# Patient Record
Sex: Male | Born: 1995
Health system: Southern US, Community
[De-identification: ages and names within clinical notes are randomized; demographics above are authoritative.]

## PROBLEM LIST (undated history)

## (undated) DIAGNOSIS — G919 Hydrocephalus, unspecified: Secondary | ICD-10-CM

## (undated) DIAGNOSIS — E2740 Unspecified adrenocortical insufficiency: Secondary | ICD-10-CM

## (undated) DIAGNOSIS — M810 Age-related osteoporosis without current pathological fracture: Secondary | ICD-10-CM

## (undated) DIAGNOSIS — E079 Disorder of thyroid, unspecified: Secondary | ICD-10-CM

## (undated) DIAGNOSIS — C801 Malignant (primary) neoplasm, unspecified: Secondary | ICD-10-CM

## (undated) HISTORY — DX: Hydrocephalus, unspecified: G91.9

## (undated) HISTORY — DX: Unspecified adrenocortical insufficiency: E27.40

## (undated) HISTORY — PX: OTHER SURGICAL HISTORY: SHX169

## (undated) HISTORY — PX: HIP SURGERY: SHX245

## (undated) HISTORY — DX: Disorder of thyroid, unspecified: E07.9

## (undated) HISTORY — DX: Malignant (primary) neoplasm, unspecified: C80.1

---
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
## 2015-09-09 ENCOUNTER — Emergency Department (HOSPITAL_COMMUNITY): Payer: Medicaid Other

## 2015-09-09 ENCOUNTER — Inpatient Hospital Stay (HOSPITAL_COMMUNITY)
Admission: EM | Admit: 2015-09-09 | Discharge: 2015-09-11 | DRG: 193 | Disposition: A | Discharge status: Home / Self Care | Payer: Medicaid Other | Attending: Family Medicine | Admitting: Family Medicine

## 2015-09-09 ENCOUNTER — Encounter (HOSPITAL_COMMUNITY): Payer: Self-pay | Admitting: Emergency Medicine

## 2015-09-09 ENCOUNTER — Encounter: Payer: Self-pay | Admitting: Family Medicine

## 2015-09-09 ENCOUNTER — Ambulatory Visit (INDEPENDENT_AMBULATORY_CARE_PROVIDER_SITE_OTHER): Payer: Medicaid Other | Admitting: Family Medicine

## 2015-09-09 VITALS — BP 106/52 | HR 78 | Temp 97.0°F | Ht 70.0 in | Wt 150.0 lb

## 2015-09-09 DIAGNOSIS — E038 Other specified hypothyroidism: Secondary | ICD-10-CM | POA: Diagnosis not present

## 2015-09-09 DIAGNOSIS — E871 Hypo-osmolality and hyponatremia: Secondary | ICD-10-CM | POA: Diagnosis present

## 2015-09-09 DIAGNOSIS — E860 Dehydration: Secondary | ICD-10-CM | POA: Diagnosis present

## 2015-09-09 DIAGNOSIS — C801 Malignant (primary) neoplasm, unspecified: Secondary | ICD-10-CM

## 2015-09-09 DIAGNOSIS — E2740 Unspecified adrenocortical insufficiency: Secondary | ICD-10-CM | POA: Diagnosis not present

## 2015-09-09 DIAGNOSIS — E43 Unspecified severe protein-calorie malnutrition: Secondary | ICD-10-CM

## 2015-09-09 DIAGNOSIS — E039 Hypothyroidism, unspecified: Secondary | ICD-10-CM | POA: Diagnosis present

## 2015-09-09 DIAGNOSIS — E876 Hypokalemia: Secondary | ICD-10-CM | POA: Diagnosis not present

## 2015-09-09 DIAGNOSIS — I959 Hypotension, unspecified: Secondary | ICD-10-CM

## 2015-09-09 DIAGNOSIS — Z982 Presence of cerebrospinal fluid drainage device: Secondary | ICD-10-CM

## 2015-09-09 DIAGNOSIS — R627 Adult failure to thrive: Secondary | ICD-10-CM | POA: Diagnosis present

## 2015-09-09 DIAGNOSIS — J189 Pneumonia, unspecified organism: Secondary | ICD-10-CM | POA: Diagnosis not present

## 2015-09-09 DIAGNOSIS — Z85841 Personal history of malignant neoplasm of brain: Secondary | ICD-10-CM

## 2015-09-09 DIAGNOSIS — I9589 Other hypotension: Secondary | ICD-10-CM | POA: Diagnosis present

## 2015-09-09 DIAGNOSIS — F17210 Nicotine dependence, cigarettes, uncomplicated: Secondary | ICD-10-CM | POA: Diagnosis present

## 2015-09-09 DIAGNOSIS — R51 Headache: Secondary | ICD-10-CM

## 2015-09-09 DIAGNOSIS — R519 Headache, unspecified: Secondary | ICD-10-CM

## 2015-09-09 DIAGNOSIS — D649 Anemia, unspecified: Secondary | ICD-10-CM | POA: Diagnosis present

## 2015-09-09 DIAGNOSIS — Z923 Personal history of irradiation: Secondary | ICD-10-CM

## 2015-09-09 LAB — PHOSPHORUS: PHOSPHORUS: 2.6 mg/dL (ref 2.5–4.6)

## 2015-09-09 LAB — CBC WITH DIFFERENTIAL/PLATELET
BASOS ABS: 0 10*3/uL (ref 0.0–0.1)
Basophils Relative: 0 %
EOS ABS: 0.2 10*3/uL (ref 0.0–0.7)
EOS PCT: 1 %
HCT: 30 % — ABNORMAL LOW (ref 39.0–52.0)
Hemoglobin: 10.8 g/dL — ABNORMAL LOW (ref 13.0–17.0)
LYMPHS ABS: 2 10*3/uL (ref 0.7–4.0)
Lymphocytes Relative: 16 %
MCH: 32.2 pg (ref 26.0–34.0)
MCHC: 36 g/dL (ref 30.0–36.0)
MCV: 89.6 fL (ref 78.0–100.0)
MONO ABS: 1 10*3/uL (ref 0.1–1.0)
Monocytes Relative: 8 %
Neutro Abs: 9.2 10*3/uL — ABNORMAL HIGH (ref 1.7–7.7)
Neutrophils Relative %: 75 %
PLATELETS: 291 10*3/uL (ref 150–400)
RBC: 3.35 MIL/uL — AB (ref 4.22–5.81)
RDW: 13.4 % (ref 11.5–15.5)
WBC: 12.4 10*3/uL — AB (ref 4.0–10.5)

## 2015-09-09 LAB — COMPREHENSIVE METABOLIC PANEL
ALT: 8 U/L — AB (ref 17–63)
AST: 22 U/L (ref 15–41)
Albumin: 3.6 g/dL (ref 3.5–5.0)
Alkaline Phosphatase: 85 U/L (ref 38–126)
Anion gap: 8 (ref 5–15)
BUN: 7 mg/dL (ref 6–20)
CHLORIDE: 90 mmol/L — AB (ref 101–111)
CO2: 33 mmol/L — AB (ref 22–32)
CREATININE: 0.6 mg/dL — AB (ref 0.61–1.24)
Calcium: 9.1 mg/dL (ref 8.9–10.3)
GFR calc non Af Amer: >60 mL/min (ref >60)
Glucose, Bld: 99 mg/dL (ref 65–99)
POTASSIUM: 2.6 mmol/L — AB (ref 3.5–5.1)
SODIUM: 131 mmol/L — AB (ref 135–145)
Total Bilirubin: 0.6 mg/dL (ref 0.3–1.2)
Total Protein: 6.8 g/dL (ref 6.5–8.1)

## 2015-09-09 LAB — URINALYSIS, ROUTINE W REFLEX MICROSCOPIC
BILIRUBIN URINE: NEGATIVE
Glucose, UA: NEGATIVE mg/dL
Hgb urine dipstick: NEGATIVE
KETONES UR: NEGATIVE mg/dL
LEUKOCYTES UA: NEGATIVE
NITRITE: NEGATIVE
PH: 5.5 (ref 5.0–8.0)
PROTEIN: NEGATIVE mg/dL
Specific Gravity, Urine: 1.02 (ref 1.005–1.030)
UROBILINOGEN UA: 1 mg/dL (ref 0.0–1.0)

## 2015-09-09 LAB — TSH: TSH: 5.549 u[IU]/mL — AB (ref 0.350–4.500)

## 2015-09-09 LAB — I-STAT CG4 LACTIC ACID, ED
LACTIC ACID, VENOUS: 1.83 mmol/L (ref 0.5–2.0)
LACTIC ACID, VENOUS: 0.58 mmol/L (ref 0.5–2.0)

## 2015-09-09 LAB — MAGNESIUM: MAGNESIUM: 1.4 mg/dL — AB (ref 1.7–2.4)

## 2015-09-09 MED ORDER — SODIUM CHLORIDE 0.9 % IV BOLUS (SEPSIS)
1000.0000 mL | Freq: Once | INTRAVENOUS | Status: AC
Start: 2015-09-09 — End: 2015-09-09
  Administered 2015-09-09: 1000 mL via INTRAVENOUS

## 2015-09-09 MED ORDER — POTASSIUM CHLORIDE CRYS ER 20 MEQ PO TBCR
40.0000 meq | EXTENDED_RELEASE_TABLET | Freq: Once | ORAL | Status: AC
  Administered 2015-09-09: 40 meq via ORAL
  Filled 2015-09-09: qty 2

## 2015-09-09 MED ORDER — ONDANSETRON HCL 4 MG PO TABS: 4.0000 mg | ORAL_TABLET | Freq: Four times a day (QID) | ORAL | Status: DC | PRN

## 2015-09-09 MED ORDER — ONDANSETRON HCL 4 MG/2ML IJ SOLN
4.0000 mg | Freq: Four times a day (QID) | INTRAMUSCULAR | Status: DC | PRN
  Administered 2015-09-10 – 2015-09-11 (×2): 4 mg via INTRAVENOUS
  Filled 2015-09-09 (×3): qty 2

## 2015-09-09 MED ORDER — SODIUM CHLORIDE 0.9 % IV SOLN
INTRAVENOUS | Status: DC
  Administered 2015-09-09 – 2015-09-10 (×2): via INTRAVENOUS

## 2015-09-09 MED ORDER — HYDROCORTISONE NA SUCCINATE PF 100 MG IJ SOLR
100.0000 mg | Freq: Once | INTRAMUSCULAR | Status: AC
  Administered 2015-09-09: 100 mg via INTRAVENOUS
  Filled 2015-09-09: qty 2

## 2015-09-09 MED ORDER — SODIUM CHLORIDE 0.9 % IJ SOLN
3.0000 mL | Freq: Two times a day (BID) | INTRAMUSCULAR | Status: DC
  Administered 2015-09-10: 3 mL via INTRAVENOUS

## 2015-09-09 MED ORDER — MAGNESIUM SULFATE 2 GM/50ML IV SOLN
2.0000 g | Freq: Once | INTRAVENOUS | Status: AC
  Administered 2015-09-09: 2 g via INTRAVENOUS
  Filled 2015-09-09: qty 50

## 2015-09-09 MED ORDER — LEVOTHYROXINE SODIUM 88 MCG PO TABS
88.0000 ug | ORAL_TABLET | Freq: Every day | ORAL | Status: DC
  Administered 2015-09-10 – 2015-09-11 (×2): 88 ug via ORAL
  Filled 2015-09-09 (×2): qty 1

## 2015-09-09 MED ORDER — POTASSIUM CHLORIDE 10 MEQ/100ML IV SOLN
10.0000 meq | INTRAVENOUS | Status: AC
Start: 2015-09-09 — End: 2015-09-09
  Administered 2015-09-09: 10 meq via INTRAVENOUS
  Filled 2015-09-09: qty 100

## 2015-09-09 MED ORDER — ENOXAPARIN SODIUM 40 MG/0.4ML ~~LOC~~ SOLN: 40.0000 mg | SUBCUTANEOUS | Status: DC

## 2015-09-09 MED ORDER — POTASSIUM CHLORIDE CRYS ER 20 MEQ PO TBCR
40.0000 meq | EXTENDED_RELEASE_TABLET | Freq: Once | ORAL | Status: AC
  Administered 2015-09-09: 40 meq via ORAL

## 2015-09-09 MED ORDER — ACETAMINOPHEN 650 MG RE SUPP: 650.0000 mg | Freq: Four times a day (QID) | RECTAL | Status: DC | PRN

## 2015-09-09 MED ORDER — POTASSIUM CHLORIDE 10 MEQ/100ML IV SOLN
10.0000 meq | INTRAVENOUS | Status: AC
  Administered 2015-09-09 – 2015-09-10 (×6): 10 meq via INTRAVENOUS
  Filled 2015-09-09 (×2): qty 100

## 2015-09-09 MED ORDER — CEFTRIAXONE SODIUM 1 G IJ SOLR
1.0000 g | Freq: Once | INTRAMUSCULAR | Status: AC
  Administered 2015-09-09: 1 g via INTRAVENOUS
  Filled 2015-09-09: qty 10

## 2015-09-09 MED ORDER — PREDNISONE 10 MG PO TABS
5.0000 mg | ORAL_TABLET | Freq: Every day | ORAL | Status: DC
  Administered 2015-09-10: 5 mg via ORAL
  Filled 2015-09-09: qty 1

## 2015-09-09 MED ORDER — ENOXAPARIN SODIUM 40 MG/0.4ML ~~LOC~~ SOLN
40.0000 mg | SUBCUTANEOUS | Status: DC
  Administered 2015-09-09 – 2015-09-10 (×2): 40 mg via SUBCUTANEOUS
  Filled 2015-09-09 (×2): qty 0.4

## 2015-09-09 MED ORDER — LEVOTHYROXINE SODIUM 88 MCG PO TABS: 88.0000 ug | ORAL_TABLET | Freq: Every day | ORAL | Status: DC

## 2015-09-09 MED ORDER — DEXTROSE 5 % IV SOLN
500.0000 mg | INTRAVENOUS | Status: DC
  Administered 2015-09-09: 500 mg via INTRAVENOUS
  Filled 2015-09-09: qty 500

## 2015-09-09 MED ORDER — PREDNISONE 10 MG PO TABS: 5.0000 mg | ORAL_TABLET | Freq: Every day | ORAL | Status: DC

## 2015-09-09 MED ORDER — AZITHROMYCIN 250 MG PO TABS
500.0000 mg | ORAL_TABLET | ORAL | Status: DC
  Administered 2015-09-10: 500 mg via ORAL
  Filled 2015-09-09: qty 2

## 2015-09-09 MED ORDER — ACETAMINOPHEN 325 MG PO TABS
650.0000 mg | ORAL_TABLET | Freq: Four times a day (QID) | ORAL | Status: DC | PRN
  Administered 2015-09-10 (×2): 650 mg via ORAL
  Filled 2015-09-09 (×2): qty 2

## 2015-09-09 MED ORDER — DEXTROSE 5 % IV SOLN
1.0000 g | INTRAVENOUS | Status: DC
  Administered 2015-09-10: 1 g via INTRAVENOUS
  Filled 2015-09-09 (×3): qty 10

## 2015-09-09 NOTE — Progress Notes (Signed)
BP 106/52 mmHg  Pulse 78  Temp(Src) 97 F (36.1 C) (Oral)  Ht '5\' 10"'  (1.778 m)  Wt 150 lb (68.04 kg)  BMI 21.52 kg/m2   Subjective:    Patient ID: James Hoffman, male    DOB: [birth year], 19 y.o.   MRN: [id number]  HPI: DEFORREST BOGLE is a 19 y.o. male presenting on 09/09/2015 for Thyroid recheck   HPI Hypothyroidism Patient present today for refill on his thyroid medication. He has been out of his thyroid medication for 3 weeks and in that time has relatively stopped eating and almost stopped fluid intake as well. He has thinned out a lot and her mother, pale appearance but often has that. He was followed up with an endocrinologist for this but she has been unable to take him down to the appointments for that.  Malnutrition and dehydration Patient presents today with the appearance of malnutrition and severe dehydration because he has not been eating or having very good fluid intake over the past time. He denies any abdominal complaints or urinary complaints or respiratory complaints. Per mother does admit that his urine has been very dark and decreased in frequency.  Relevant past medical, surgical, family and social history reviewed and updated as indicated. Interim medical history since our last visit reviewed. Allergies and medications reviewed and updated.  Review of Systems  Constitutional: Positive for fatigue and unexpected weight change. Negative for fever and chills.  HENT: Positive for congestion. Negative for ear discharge and ear pain.   Eyes: Negative for pain, discharge, redness and visual disturbance.  Respiratory: Positive for cough. Negative for shortness of breath and wheezing.   Cardiovascular: Negative for chest pain, palpitations and leg swelling.  Gastrointestinal: Negative for nausea, vomiting, abdominal pain, diarrhea, constipation, blood in stool and anal bleeding.  Endocrine: Negative for cold intolerance, heat intolerance, polydipsia and  polyuria.  Genitourinary: Positive for decreased urine volume. Negative for urgency, frequency, hematuria, flank pain and difficulty urinating.  Musculoskeletal: Negative for back pain and gait problem.  Skin: Negative for rash.  Neurological: Negative for dizziness, syncope, light-headedness and headaches.  All other systems reviewed and are negative.   Per HPI unless specifically indicated above     Medication List       This list is accurate as of: 09/09/15  2:18 PM.  Always use your most recent med list.               levothyroxine 88 MCG tablet  Commonly known as:  SYNTHROID, LEVOTHROID  Take 1 tablet (88 mcg total) by mouth daily before breakfast.           Objective:    BP 106/52 mmHg  Pulse 78  Temp(Src) 97 F (36.1 C) (Oral)  Ht '5\' 10"'  (1.778 m)  Wt 150 lb (68.04 kg)  BMI 21.52 kg/m2  Wt Readings from Last 3 Encounters:  09/09/15 150 lb (68.04 kg) (43 %*, Z = -0.18)  11/25/13 154 lb 6.4 oz (70.035 kg) (62 %*, Z = 0.30)   * Growth percentiles are based on CDC 2-20 Years data.    Physical Exam  Constitutional: He is oriented to person, place, and time. Vital signs are normal. He appears cachectic. He is cooperative. He has a sickly appearance. He appears ill. No distress.  HENT:  Mouth/Throat: Uvula is midline. Mucous membranes are pale, dry and not cyanotic. No oropharyngeal exudate, posterior oropharyngeal edema, posterior oropharyngeal erythema or tonsillar abscesses.  Eyes: Conjunctivae and EOM are  normal. Pupils are equal, round, and reactive to light. Right eye exhibits no discharge. Left eye exhibits no discharge and no exudate. No scleral icterus.  Sunken eyes  Cardiovascular: Normal rate, regular rhythm, normal heart sounds and intact distal pulses.   No murmur heard. Pulmonary/Chest: Effort normal and breath sounds normal. No respiratory distress. He has no wheezes.  Abdominal: He exhibits no distension.  Musculoskeletal: Normal range of motion.  He exhibits no edema.  Neurological: He is oriented to person, place, and time. Coordination normal.  Skin: Skin is warm and dry. No rash noted. He is not diaphoretic.  Psychiatric: He has a normal mood and affect. His behavior is normal.  Vitals reviewed.   Results for orders placed or performed in visit on 12/23/13  CMP14+EGFR  Result Value Ref Range   Glucose 102 (H) 65 - 99 mg/dL   BUN 9 5 - 18 mg/dL   Creatinine, Ser 1.06 0.76 - 1.27 mg/dL   GFR calc non Af Amer CANCELED mL/min/1.73   GFR calc Af Amer CANCELED mL/min/1.73   BUN/Creatinine Ratio 8 (L) 9 - 27   Sodium 137 134 - 144 mmol/L   Potassium 3.3 (L) 3.5 - 5.2 mmol/L   Chloride 97 97 - 108 mmol/L   CO2 20 18 - 29 mmol/L   Calcium 9.0 8.9 - 10.4 mg/dL   Total Protein 6.0 6.0 - 8.5 g/dL   Albumin 4.0 3.5 - 5.5 g/dL   Globulin, Total 2.0 1.5 - 4.5 g/dL   Albumin/Globulin Ratio 2.0 1.1 - 2.5   Total Bilirubin <0.2 0.0 - 1.2 mg/dL   Alkaline Phosphatase 90 61 - 146 IU/L   AST 17 0 - 40 IU/L   ALT 15 0 - 30 IU/L  Thyroid Panel With TSH  Result Value Ref Range   TSH 1.200 0.450 - 4.500 uIU/mL   T4, Total 6.2 4.5 - 12.0 ug/dL   T3 Uptake Ratio 31 24 - 38 %   Free Thyroxine Index 1.9 1.2 - 4.9      Assessment & Plan:   Problem List Items Addressed This Visit      Endocrine   Other specified hypothyroidism - Primary    Restart thyroid medication. Test thyroid levels      Relevant Medications   levothyroxine (SYNTHROID, LEVOTHROID) 88 MCG tablet   Other Relevant Orders   Thyroid Panel With TSH    Other Visit Diagnoses    Dehydration        Patient appears dehydrated and severely malnourished. His eyes are sunken and his mucous membranes are dry and he has decreased urinary output, sent to ED    Relevant Orders    CBC with Differential/Platelet    CMP14+EGFR    POCT UA - Microscopic Only    POCT urinalysis dipstick    Severe malnutrition ([place])        Relevant Orders    Face-to-face encounter (required for  Medicare/Medicaid patients)        Follow up plan: Return in about 2 weeks (around 09/23/2015), or if symptoms worsen or fail to improve, for Follow-up on thyroid and malnutrition.  Caryl Pina, MD [facility] 09/09/2015, 2:18 PM

## 2015-09-09 NOTE — H&P (Signed)
[facility] History and Physical  DEAKEN JURGENS HCW:[id number] DOB: 12-Nov-1996 DOA: 09/09/2015  Referring physician: Dr Oleta Mouse - [facility] PCP: Kenn File, MD   Chief Complaint: James Hoffman poor oral intake  HPI: James Hoffman is a 19 y.o. male   1 week history of productive cough with associated runny nose and sore throat. Initially intermittent but fairly constant. Getting worse. Subjective fevers.Very little oral intake over the last several days. Urine is become very dark and pungent. Intermittent nausea and vomiting with food which is made eating significantly less palatable for patient. Patient with intermittent headaches as well which are somewhat baseline for patient. States patient has seemed a little more confused and forgetful recently. Denies abdominal pain, diarrhea, vision change, focal abnormality.  Intracranial shunt placed in 2012 with 2 revisions. Shunt modification in 2013. Last nor appointment March 2016.  Out of thyroid medication for approximately one month.general fatigue and cold intolerance.   Review of Systems:  Constitutional:  No weight loss, night sweats,  HEENT:  No difficulty swallowing,Tooth/dental problems Cardio-vascular:  No chest pain, Orthopnea, PND, swelling in lower extremities, anasarca, dizziness, palpitations  GI:  No heartburn, indigestion, change in bowel habits, Resp: Per HPI  Skin:  no rash or lesions.  GU:  no dysuria, change in color of urine, no urgency or frequency. No flank pain.  Musculoskeletal:   No joint pain or swelling. No decreased range of motion. No back pain.  Psych: Per HPI Neuro:  No change in sensation, unilateral strength, or cognitive abilities  All other systems were reviewed and are negative.  Past Medical History  Diagnosis Date  . Thyroid disease   . Cancer ([place])     brain tumor on brain stem  . Adrenal insufficiency ([place])   . Hydrocephalus    Past Surgical History  Procedure Laterality  Date  . Brain tumor removal  August 2012   Social History:  reports that he has been smoking Cigarettes.  He started smoking about 2 years ago. He has been smoking about 0.50 packs per day. He does not have any smokeless tobacco history on file. He reports that he does not drink alcohol or use illicit drugs.  No Known Allergies  History reviewed. No pertinent family history.   Prior to Admission medications   Medication Sig Start Date End Date Taking? Authorizing Provider  predniSONE (DELTASONE) 5 MG tablet Take 5 mg by mouth daily. Take 2-3 tabs a day when sick 07/07/15  Yes Historical Provider, MD  levothyroxine (SYNTHROID, LEVOTHROID) 88 MCG tablet Take 1 tablet (88 mcg total) by mouth daily before breakfast. 09/09/15   Fransisca Kaufmann Dettinger, MD   Physical Exam: Filed Vitals:   09/09/15 1624 09/09/15 1700 09/09/15 1730 09/09/15 1800  BP: 102/65 95/68 99/76  122/66  Pulse: 91 84 84 89  Temp:      TempSrc:      Resp: 16 15 25 20   Height:      Weight:      SpO2: 97% 98% 100% 97%    Wt Readings from Last 3 Encounters:  09/09/15 61.236 kg (135 lb) (19 %*, Z = -0.89)  09/09/15 68.04 kg (150 lb) (43 %*, Z = -0.18)  11/25/13 70.035 kg (154 lb 6.4 oz) (62 %*, Z = 0.30)   * Growth percentiles are based on CDC 2-20 Years data.    General:  Appears frail but calm and comfortable Eyes:  PERRL, EOMI, normal lids, iris ENT: Dry mm Neck:  no LAD, masses or  thyromegaly Cardiovascular:  RRR, II/VI systolic murmur. No LE edema.  Respiratory: decreased breath sounds at bases bilaterally with audible crackles. Normal effort Abdomen:  soft, ntnd Skin:  no rash or induration seen on limited exam Musculoskeletal:  grossly normal tone BUE/BLE Psychiatric:  grossly normal mood and affect, speech fluent and appropriate Neurologic:  Hard of hearing at baseline. CN 2-12 grossly intact, moves all extremities in coordinated fashion.          Labs on Admission:  Basic Metabolic Panel:  Recent  Labs Lab 09/09/15 1542  NA 131*  K 2.6*  CL 90*  CO2 33*  GLUCOSE 99  BUN 7  CREATININE 0.60*  CALCIUM 9.1  MG 1.4*  PHOS 2.6   Liver Function Tests:  Recent Labs Lab 09/09/15 1542  AST 22  ALT 8*  ALKPHOS 85  BILITOT 0.6  PROT 6.8  ALBUMIN 3.6   No results for input(s): LIPASE, AMYLASE in the last 168 hours. No results for input(s): AMMONIA in the last 168 hours. CBC:  Recent Labs Lab 09/09/15 1542  WBC 12.4*  NEUTROABS 9.2*  HGB 10.8*  HCT 30.0*  MCV 89.6  PLT 291   Cardiac Enzymes: No results for input(s): CKTOTAL, CKMB, CKMBINDEX, TROPONINI in the last 168 hours.  BNP (last 3 results) No results for input(s): BNP in the last 8760 hours.  ProBNP (last 3 results) No results for input(s): PROBNP in the last 8760 hours.  CBG: No results for input(s): GLUCAP in the last 168 hours.  Radiological Exams on Admission: Dg Chest 2 View  09/09/2015  CLINICAL DATA:  Dehydration, loss of appetite. Chronic productive cough. EXAM: CHEST  2 VIEW COMPARISON:  None. FINDINGS: Radiopaque tubing transverses the right thorax. Cardiomediastinal silhouette is normal. Mediastinal contours appear intact. There is an ill-defined focal airspace consolidation in the left lung base, which may represent a developing pneumonia. There may be small bilateral pleural effusions. Osseous structures are without acute abnormality. Soft tissues are grossly normal. IMPRESSION: Subtle left lower lobe airspace consolidation, which may represent a developing pneumonia. Probable small bilateral pleural effusions. Electronically Signed   By: Fidela Salisbury M.D.   On: 09/09/2015 16:31   Dg Abd 1 View  09/09/2015  CLINICAL DATA:  Abdominal pain with loss of appetite EXAM: ABDOMEN - 1 VIEW COMPARISON:  None. FINDINGS: There is no bowel dilatation or air-fluid level suggesting obstruction. No free air is seen on this supine examination. There is moderate stool in the colon. There are no abnormal  calcifications. IMPRESSION: Bowel gas pattern unremarkable. Electronically Signed   By: Lowella Grip III M.D.   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 COMPARISON:  CT head dated 09/07/2011. FINDINGS: Bilateral extra-axial subdural collections, measuring 7 mm on the right and 5 mm on the left (series 2/image 25). No evidence of parenchymal hemorrhage. No mass lesion, mass effect, or midline shift. No CT evidence of acute infarction. Cerebral volume is within normal limits. Ventricles are decompressed with a right parietal approach ventriculostomy catheter terminating at the foramen of [place]ro. The visualized paranasal sinuses are essentially clear. The mastoid air cells are unopacified. Status post suboccipital craniectomy. Postsurgical changes involving the cerebellar vermis/posterior fossa. No  evidence of calvarial fracture. IMPRESSION: Bilateral extra-axial subdural collections, measuring 7 mm on the right and 5 mm on the left. Ventricles are decompressed with a right parietal approach ventriculostomy catheter. Overall, in the absence of a traumatic history, this appearance raises the possibility of intracranial hypotension secondary to overshunting. These results were called by telephone at the time of interpretation on 09/09/2015 at 4:12 pm to Dr. Brantley Stage, who verbally acknowledged these results. Electronically Signed   By: Julian Hy M.D.   On: 09/09/2015 16:14     Assessment/Plan Principal Problem:   CAP (community acquired pneumonia) Active Problems:   Other specified hypothyroidism   Medullary carcinoma ([place])   Adrenal insufficiency ([facility])    Hypotension   Dehydration   Hyponatremia   Hypokalemia   SOB: likely secondary to lower lobe pneumonia. Note O2 requirement. WBC 12.4. Lactic acid 1.8. Afebrile, vital signs stable. - telemetry - sputum Cx - legionella and strep Ag - CTX and Azithro  Medullary Ca s/p resection and radiation in 2012. Ct w/o acute process but notes possible overshunting and 61mm and 51mm collections in subdura. ED physician discussed w/ Pts Neurosurgeon at [facility], Dr. Prince Rome, who feels CT findings are chronic and wants him to f/u in 1 wk after discharge.   Adrenal insufficiency: compliant w/ home Prednisone. BP appears to be at baseline.  - continue prednisone but consider stress dose steroids if not improving.   Hypotension: pt low nml BP at baseline worsened by dehydration and adrenal insufficiency.  - IVF - consider stress dose steroids as above.   Hypothyroid: TSH >5. Out of medications for weeks - restart levothyroxine.   Hyponatremia: 131 - IVF NS  HypoK:2.6. Little repletion in ED - KCL 37mEq x6 - Kdur 40 x1 - MAg level   Code Status: FULL  DVT Prophylaxis: Lovenox Family Communication: Mother Disposition Plan:  Pending Improvement    Marquavious Nazar J, MD Family Medicine [facility] [email or web address] Password [email or web address]

## 2015-09-09 NOTE — Assessment & Plan Note (Addendum)
Restart thyroid medication. Test thyroid levels

## 2015-09-09 NOTE — ED Notes (Addendum)
Pt sent over from PCP for dehydration and lack of appetite. Family states pt has been out of thyroid medication. Pt has hx of brain tumor. Pt not currently receiving treatment at this time. Pt alert. Skin is pale and mucous membranes are dry. Pt c/o of LT ear pain. Family requesting MRI. Pt hypotensive in triage.

## 2015-09-09 NOTE — Progress Notes (Signed)
RT entered room to assess patient. Patient states he is not experiencing shortness of breath and does not take treatments at home. Patient's breath sounds are clear and diminished. No treatment was given at this time.

## 2015-09-09 NOTE — ED Provider Notes (Signed)
CSN: [id number]     Arrival date & time 09/09/15  1509 History   First MD Initiated Contact with Patient 09/09/15 1528     Chief Complaint  Patient presents with  . Dehydration     (Consider location/radiation/quality/duration/timing/severity/associated sxs/prior Treatment) HPI  19 year old male who presents with concern for dehydration and generalized weakness. 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 It was complicated by development of hydrocephalus status post VP shunt placement, adrenal insufficiency, and hypothyroidism. History is provided by the patient's mother who states that for the past week he has had upper respiratory symptoms including cough, congestion, runny nose and sore throat. He has been out of his levothyroxine for the past 2 weeks, it was recommended that by his endocrinologist that they see the primary care provider for a refill. He also takes prednisone, which he has been compliant with. Over the past week he has had progressively decreased appetite, with intermittent nausea and vomiting after eating. Also complains of intermittent headaches, and mother states that he is more forgetful and occasionally confused. Denies any fevers or chills. He has not had any abdominal pain, abdominal distention, diarrhea,  Vision changes, speech changes, focal weakness or numbness. Was seen in the PCPs office today and sent to the ED for further evaluation.    Past Medical History  Diagnosis Date  . Thyroid disease   . Cancer ([place])     brain tumor on brain stem  . Adrenal insufficiency ([place])   . Hydrocephalus    Past Surgical History  Procedure Laterality Date  . Brain tumor removal  August 2012   History reviewed. No pertinent family history. Social History  Substance Use Topics  . Smoking status: Current Every Day Smoker -- 0.50 packs/day    Types: Cigarettes    Start date: 11/25/2012  . Smokeless tobacco: None  .  Alcohol Use: No    Review of Systems 10/14 systems reviewed and are negative other than those stated in the HPI  Allergies  Review of patient's allergies indicates no known allergies.  Home Medications   Prior to Admission medications   Medication Sig Start Date End Date Taking? Authorizing Provider  predniSONE (DELTASONE) 5 MG tablet Take 5 mg by mouth daily. Take 2-3 tabs a day when sick 07/07/15  Yes Historical Provider, MD  levothyroxine (SYNTHROID, LEVOTHROID) 88 MCG tablet Take 1 tablet (88 mcg total) by mouth daily before breakfast. 09/09/15   Fransisca Kaufmann Dettinger, MD   BP 104/68 mmHg  Pulse 88  Temp(Src) 97.5 F (36.4 C) (Oral)  Resp 20  Ht 5\' 10"  (1.778 m)  Wt 135 lb (61.236 kg)  BMI 19.37 kg/m2  SpO2 98% Physical Exam Physical Exam  Nursing note and vitals reviewed. Constitutional:  Chronically ill-appearing, pale, cachectic in appearance, is in no acute distress Head: Normocephalic and atraumatic.  Mouth/Throat: Oropharynx is clear. Mucous membranes are dry.  Neck: Normal range of motion. Neck supple.  Cardiovascular: Normal rate and regular rhythm.   Pulmonary/Chest: Effort normal and breath sounds normal.  Abdominal: Soft. There is no tenderness. There is no rebound and no guarding.  Musculoskeletal: Normal range of motion.  Neurological: Alert, no facial droop, fluent speech, moves all extremities symmetrically Skin: Skin is warm and dry.  Psychiatric: Cooperative  ED Course  Procedures (including critical care time) Labs Review Labs Reviewed  CBC WITH DIFFERENTIAL/PLATELET - Abnormal; Notable for the following:    WBC 12.4 (*)    RBC  3.35 (*)    Hemoglobin 10.8 (*)    HCT 30.0 (*)    Neutro Abs 9.2 (*)    All other components within normal limits  COMPREHENSIVE METABOLIC PANEL - Abnormal; Notable for the following:    Sodium 131 (*)    Potassium 2.6 (*)    Chloride 90 (*)    CO2 33 (*)    Creatinine, Ser 0.60 (*)    ALT 8 (*)    All other  components within normal limits  MAGNESIUM - Abnormal; Notable for the following:    Magnesium 1.4 (*)    All other components within normal limits  TSH - Abnormal; Notable for the following:    TSH 5.549 (*)    All other components within normal limits  CULTURE, BLOOD (ROUTINE X 2)  CULTURE, BLOOD (ROUTINE X 2)  CULTURE, EXPECTORATED SPUTUM-ASSESSMENT  GRAM STAIN  PHOSPHORUS  URINALYSIS, ROUTINE W REFLEX MICROSCOPIC (NOT AT [facility])  T4, FREE  HIV ANTIBODY (ROUTINE TESTING)  LEGIONELLA PNEUMOPHILA SEROGP 1 UR AG  STREP PNEUMONIAE URINARY ANTIGEN  CBC  BASIC METABOLIC PANEL  I-STAT CG4 LACTIC ACID, ED  I-STAT CG4 LACTIC ACID, ED    Imaging Review Dg Chest 2 View  09/09/2015  CLINICAL DATA:  Dehydration, loss of appetite. Chronic productive cough. EXAM: CHEST  2 VIEW COMPARISON:  None. FINDINGS: Radiopaque tubing transverses the right thorax. Cardiomediastinal silhouette is normal. Mediastinal contours appear intact. There is an ill-defined focal airspace consolidation in the left lung base, which may represent a developing pneumonia. There may be small bilateral pleural effusions. Osseous structures are without acute abnormality. Soft tissues are grossly normal. IMPRESSION: Subtle left lower lobe airspace consolidation, which may represent a developing pneumonia. Probable small bilateral pleural effusions. Electronically Signed   By: Fidela Salisbury M.D.   On: 09/09/2015 16:31   Dg Abd 1 View  09/09/2015  CLINICAL DATA:  Abdominal pain with loss of appetite EXAM: ABDOMEN - 1 VIEW COMPARISON:  None. FINDINGS: There is no bowel dilatation or air-fluid level suggesting obstruction. No free air is seen on this supine examination. There is moderate stool in the colon. There are no abnormal calcifications. IMPRESSION: Bowel gas pattern unremarkable. Electronically Signed   By: Lowella Grip III M.D.   On: 09/09/2015 16:27   Ct Head Wo Contrast  09/09/2015  CLINICAL DATA:   Dehydration, weakness, hydrocephalus status post VP shunt placement, history of medulloblastoma of the brainstem status post resection and radiation in 2012 EXAM: CT HEAD WITHOUT CONTRAST TECHNIQUE: Contiguous axial images were obtained from the base of the skull through the vertex without intravenous contrast. COMPARISON:  CT head dated 09/07/2011. FINDINGS: Bilateral extra-axial subdural collections, measuring 7 mm on the right and 5 mm on the left (series 2/image 25). No evidence of parenchymal hemorrhage. No mass lesion, mass effect, or midline shift. No CT evidence of acute infarction. Cerebral volume is within normal limits. Ventricles are decompressed with a right parietal approach ventriculostomy catheter terminating at the foramen of [place]ro. The visualized paranasal sinuses are essentially clear. The mastoid air cells are unopacified. Status post suboccipital craniectomy. Postsurgical changes involving the cerebellar vermis/posterior fossa. No evidence of calvarial fracture. IMPRESSION: Bilateral extra-axial subdural collections, measuring 7 mm on the right and 5 mm on the left. Ventricles are decompressed with a right parietal approach ventriculostomy catheter. Overall, in the absence of a traumatic history, this appearance raises the possibility of intracranial hypotension secondary to overshunting. These results were called by telephone at the time  of interpretation on 09/09/2015 at 4:12 pm to Dr. Brantley Stage, who verbally acknowledged these results. Electronically Signed   By: Julian Hy M.D.   On: 09/09/2015 16:14   I have personally reviewed and evaluated these images and lab results as part of my medical decision-making.   EKG Interpretation   Date/Time:  Thursday September 09 2015 16:46:47 EDT Ventricular Rate:  86 PR Interval:  148 QRS Duration: 99 QT Interval:  380 QTC Calculation: 110 R Axis:   59 Text Interpretation:  Sinus rhythm Borderline T abnormalities, anterior  leads no  prior EKG for comparison Confirmed by Azir Muzyka MD, Loreal Schuessler [phone]) on  09/09/2015 5:58:29 PM      CRITICAL CARE Performed by: Forde Dandy   Total critical care time: 30 minutes  Critical care time was exclusive of separately billable procedures and treating other patients.  Critical care was necessary to treat or prevent imminent or life-threatening deterioration.  Critical care was time spent personally by me on the following activities: development of treatment plan with patient and/or surrogate as well as nursing, discussions with consultants, evaluation of patient's response to treatment, examination of patient, obtaining history from patient or surrogate, ordering and performing treatments and interventions, ordering and review of laboratory studies, ordering and review of radiographic studies, pulse oximetry and re-evaluation of patient's condition.   MDM   Final diagnoses:  Headache  Community acquired pneumonia  Adrenal insufficiency (HCC)  S/P VP shunt  Hypokalemia  Hypomagnesemia     19 year old male with history of medullary blastoma status post resection , shunted hydrocephalus, adrenal insufficiency and hypothyroidism who presents with one week of upper respiratory symptoms with decreased by mouth intake and generalized weakness. Is chronically ill-appearing on presentation, but is in no acute distress. Appears dry on exam. Was hypotensive with systolic blood pressures in the 80s to 90s , tachycardic with heart rate in the 100s.  Afebrile and in no respiratory distress. Appears to be mentating normally. Grossly neurologically intact. Has a benign abdomen.  Lungs sounds clear to auscultation, but chest x-ray suggestive of a developing left lower lobe infiltrate concerning for pneumonia. Given that he has had clinical symptoms of pneumonia with no recent hospitalizations, was treated with ceftriaxone and azithromycin. Has mild leukocytosis, but normal lactate. Concern for adrenal  insufficiency in the setting of his illness, and given stress dose steroids of 100 mg of hydrocortisone. Received 2 L of IV fluids, and blood pressure improves to 356 systolic and tachycardia also improved. Also noted to have hypokalemia and  Hypomagnesemia. Given  2 grams of magnesium sulfate and a total of  60 mEq of potassium (20 IV and 40 PO).  Due to intermittent headaches with increased forgetfulness, CT head and shunt series was performed. This is visualized and reviewed with radiology. Concern for subdural fluid collections suggestive of intracranial hypotension and over shunting. I discussed these findings with Dr. Prince Rome from [facility] neurosurgery. He is familiar with this patient, and states that this is not an acute issue on and does not need acute intervention at this time. He did not recommend transfer to [facility] for neurosurgical evaluation.  I subsequently discussed this patient with [facility] hospitalist, who will admit this patient for adrenal insufficiency and community-acquired pneumonia.   Forde Dandy, MD 09/09/15 [phone]

## 2015-09-09 NOTE — Patient Instructions (Signed)

## 2015-09-09 NOTE — ED Notes (Signed)
David MD at bedside. 

## 2015-09-09 NOTE — ED Notes (Signed)
CRITICAL VALUE ALERT  Critical value received: Potassium 2.6  Date of notification:  09/09/15  Time of notification:  1628  Critical value read back:Yes.    Nurse who received alert:  Laurell Josephs RN  MD notified (1st page):  Oleta Mouse  Time of first page:  26  MD notified (2nd page):  Time of second page:  Responding MD:  Oleta Mouse  Time MD responded:  0626

## 2015-09-10 DIAGNOSIS — E876 Hypokalemia: Secondary | ICD-10-CM | POA: Diagnosis present

## 2015-09-10 DIAGNOSIS — D649 Anemia, unspecified: Secondary | ICD-10-CM | POA: Diagnosis present

## 2015-09-10 DIAGNOSIS — I9589 Other hypotension: Secondary | ICD-10-CM | POA: Diagnosis present

## 2015-09-10 DIAGNOSIS — E860 Dehydration: Secondary | ICD-10-CM | POA: Diagnosis not present

## 2015-09-10 DIAGNOSIS — E871 Hypo-osmolality and hyponatremia: Secondary | ICD-10-CM | POA: Diagnosis present

## 2015-09-10 DIAGNOSIS — E43 Unspecified severe protein-calorie malnutrition: Secondary | ICD-10-CM | POA: Diagnosis present

## 2015-09-10 DIAGNOSIS — E039 Hypothyroidism, unspecified: Secondary | ICD-10-CM | POA: Diagnosis not present

## 2015-09-10 DIAGNOSIS — R627 Adult failure to thrive: Secondary | ICD-10-CM | POA: Diagnosis present

## 2015-09-10 DIAGNOSIS — Z85841 Personal history of malignant neoplasm of brain: Secondary | ICD-10-CM | POA: Diagnosis not present

## 2015-09-10 DIAGNOSIS — J189 Pneumonia, unspecified organism: Secondary | ICD-10-CM | POA: Diagnosis not present

## 2015-09-10 DIAGNOSIS — Z982 Presence of cerebrospinal fluid drainage device: Secondary | ICD-10-CM | POA: Diagnosis not present

## 2015-09-10 DIAGNOSIS — Z923 Personal history of irradiation: Secondary | ICD-10-CM | POA: Diagnosis not present

## 2015-09-10 DIAGNOSIS — F17210 Nicotine dependence, cigarettes, uncomplicated: Secondary | ICD-10-CM | POA: Diagnosis present

## 2015-09-10 DIAGNOSIS — R05 Cough: Secondary | ICD-10-CM | POA: Diagnosis not present

## 2015-09-10 DIAGNOSIS — E2740 Unspecified adrenocortical insufficiency: Secondary | ICD-10-CM | POA: Diagnosis not present

## 2015-09-10 LAB — CBC
HCT: 24 % — ABNORMAL LOW (ref 39.0–52.0)
Hemoglobin: 8.5 g/dL — ABNORMAL LOW (ref 13.0–17.0)
MCH: 32.3 pg (ref 26.0–34.0)
MCHC: 35.4 g/dL (ref 30.0–36.0)
MCV: 91.3 fL (ref 78.0–100.0)
PLATELETS: 243 10*3/uL (ref 150–400)
RBC: 2.63 MIL/uL — ABNORMAL LOW (ref 4.22–5.81)
RDW: 13.7 % (ref 11.5–15.5)
WBC: 7.4 10*3/uL (ref 4.0–10.5)

## 2015-09-10 LAB — CBC WITH DIFFERENTIAL/PLATELET
BASOS: 0 %
Basophils Absolute: 0 10*3/uL (ref 0.0–0.2)
EOS (ABSOLUTE): 0.2 10*3/uL (ref 0.0–0.4)
EOS: 2 %
HEMATOCRIT: 29.6 % — AB (ref 37.5–51.0)
HEMOGLOBIN: 9.9 g/dL — AB (ref 12.6–17.7)
Immature Grans (Abs): 0.1 10*3/uL (ref 0.0–0.1)
Immature Granulocytes: 1 %
LYMPHS ABS: 2.5 10*3/uL (ref 0.7–3.1)
Lymphs: 27 %
MCH: 30.2 pg (ref 26.6–33.0)
MCHC: 33.4 g/dL (ref 31.5–35.7)
MCV: 90 fL (ref 79–97)
MONOCYTES: 7 %
Monocytes Absolute: 0.7 10*3/uL (ref 0.1–0.9)
NEUTROS ABS: 5.8 10*3/uL (ref 1.4–7.0)
Neutrophils: 63 %
PLATELETS: 320 10*3/uL (ref 150–379)
RBC: 3.28 x10E6/uL — AB (ref 4.14–5.80)
RDW: 14.5 % (ref 12.3–15.4)
WBC: 9.3 10*3/uL (ref 3.4–10.8)

## 2015-09-10 LAB — THYROID PANEL WITH TSH
Free Thyroxine Index: 1.4 (ref 1.2–4.9)
T3 UPTAKE RATIO: 26 % (ref 24–39)
T4 TOTAL: 5.3 ug/dL (ref 4.5–12.0)
TSH: 3.98 u[IU]/mL (ref 0.450–4.500)

## 2015-09-10 LAB — BASIC METABOLIC PANEL
ANION GAP: 6 (ref 5–15)
CALCIUM: 8.3 mg/dL — AB (ref 8.9–10.3)
CO2: 28 mmol/L (ref 22–32)
Chloride: 101 mmol/L (ref 101–111)
Creatinine, Ser: 0.61 mg/dL (ref 0.61–1.24)
GFR calc Af Amer: >60 mL/min (ref >60)
GLUCOSE: 96 mg/dL (ref 65–99)
Potassium: 4.4 mmol/L (ref 3.5–5.1)
SODIUM: 135 mmol/L (ref 135–145)

## 2015-09-10 LAB — CMP14+EGFR
ALK PHOS: 92 IU/L (ref 39–117)
ALT: 6 IU/L (ref 0–44)
AST: 19 IU/L (ref 0–40)
Albumin/Globulin Ratio: 1.5 (ref 1.1–2.5)
Albumin: 3.7 g/dL (ref 3.5–5.5)
BUN/Creatinine Ratio: 9 (ref 8–19)
BUN: 5 mg/dL — ABNORMAL LOW (ref 6–20)
Bilirubin Total: 0.4 mg/dL (ref 0.0–1.2)
CALCIUM: 8.8 mg/dL (ref 8.7–10.2)
CO2: 27 mmol/L (ref 18–29)
CREATININE: 0.56 mg/dL — AB (ref 0.76–1.27)
Chloride: 87 mmol/L — ABNORMAL LOW (ref 97–106)
GFR calc Af Amer: 173 mL/min/{1.73_m2} (ref >59)
GFR, EST NON AFRICAN AMERICAN: 150 mL/min/{1.73_m2} (ref >59)
GLUCOSE: 98 mg/dL (ref 65–99)
Globulin, Total: 2.5 g/dL (ref 1.5–4.5)
Potassium: 2.8 mmol/L — ABNORMAL LOW (ref 3.5–5.2)
SODIUM: 133 mmol/L — AB (ref 136–144)
Total Protein: 6.2 g/dL (ref 6.0–8.5)

## 2015-09-10 LAB — T4, FREE: FREE T4: 0.75 ng/dL (ref 0.61–1.12)

## 2015-09-10 MED ORDER — PREDNISONE 20 MG PO TABS
40.0000 mg | ORAL_TABLET | Freq: Every day | ORAL | Status: DC
  Administered 2015-09-11: 40 mg via ORAL
  Filled 2015-09-10: qty 2

## 2015-09-10 MED ORDER — PREDNISONE 20 MG PO TABS
20.0000 mg | ORAL_TABLET | Freq: Once | ORAL | Status: AC
  Administered 2015-09-10: 20 mg via ORAL
  Filled 2015-09-10: qty 1

## 2015-09-10 NOTE — Progress Notes (Signed)
PROGRESS NOTE  DONNA SILVERMAN QIH:[id number] DOB: 04-06-1996 DOA: 09/09/2015 PCP: Kenn File, MD  Summary: 69 yom with a hx of hypotension and medullary CA presented with a productive cough, rhinorrhea, and sore throat. In the ED, labs revealed a mild leukocytosis, hyponatremia, and hypokalemia. CXR consistent with a subtle PNA. He was admitted for further management.   Assessment/Plan: 1. CAP. Better today. WBC normalized to 7.4 today. Lactic acid was normal. He is afebrile with stable O2 sats. BC pending. 2. Dehydration, likely due to #3. Appears resolved.  3. FTT, severe malnutrition 4. Normocytic anemia, hgb 8.5 today. Continue to monitor. Family reports h/o chronic anemia.  5. Medulloblastoma of the brainstem s/p resection and radiation in 8756, complicated by development of hydrocephalus; s/p VP shunt. CT w/o acute process but notes possible overshunting and 54mm and 76mm collections in subdura. EDP consulted patient's neurosurgeon who requested him to follow up in 1 week. 6. Adrenal insufficiency without acute crisis. Continue Prednisone. 7. Chronic hypotension, stable. Continue IVF 8. Hypothyroidism without medication for weeks. TSH 5.549. Resume Levothyroxine. 9. Hyponatremia, resolved with IVF. 10. Hypokalemia, repleted. Hypomagnesemia repleted.   11. Tobacco use disorder   Overall improved. Continue IV abx and IVF.   Continue levothyroxine.   Likely home 1-2 days  Code Status: Full DVT prophylaxis: Lovenox Family Communication: Mother, grandmother, and cousin at bedside. Discussed with patient who understands and has no concerns at this time. Disposition Plan: Anticipate discharge within 24-48 hours.   Murray Hodgkins, MD  [facility]  Pager [phone] If 7PM-7AM, please contact night-coverage at [email or web address], password [facility] 09/10/2015, 7:49 AM  LOS: 1 day   Consultants:    Procedures:    Antibiotics:  Rocephin 10/27>>  Zithromax  10/27>>  HPI/Subjective: Feels okay. Has a mild cough but denies any SOB, pain, nausea, or vomiting. Has an appetite and per mother, has eaten more today than the last few days combined.  Family friend reports his overall color is much improved and his speech is better.   Objective: Filed Vitals:   09/09/15 2130 09/09/15 2200 09/09/15 2322 09/10/15 0536  BP: 85/63 97/69 97/69  90/75  Pulse: 109 85 98 66  Temp:   98.4 F (36.9 C) 97.9 F (36.6 C)  TempSrc:   Oral Oral  Resp: 29 19 18 18   Height:   5\' 10"  (1.778 m)   Weight:   48.626 kg (107 lb 3.2 oz)   SpO2: 91% 100% 100% 100%   No intake or output data in the 24 hours ending 09/10/15 0749   Filed Weights   09/09/15 1519 09/09/15 2322  Weight: 61.236 kg (135 lb) 48.626 kg (107 lb 3.2 oz)    Exam:     VSS, afebrile, not hypoxic General:  Appears calm and comfortable Cardiovascular: RRR, no m/r/g. No LE edema. Respiratory: CTA bilaterally, no w/r/r. Normal respiratory effort. Psychiatric: grossly normal mood and affect, speech fluent and appropriate  New data reviewed:  WBC 7.4, Hgb 8.5  BMP unremarkable.  Pertinent data since admission:  Potassium 2.6  WBC 12.4  CXR IMPRESSION: Subtle left lower lobe airspace consolidation, which may represent a developing pneumonia. Probable small bilateral pleural effusions  EKG SR.  Pending data:  BC  Scheduled Meds: . azithromycin  500 mg Oral Q24H  . cefTRIAXone (ROCEPHIN)  IV  1 g Intravenous Q24H  . enoxaparin (LOVENOX) injection  40 mg Subcutaneous Q24H  . levothyroxine  88 mcg Oral QAC breakfast  . predniSONE  5 mg Oral Daily  .  sodium chloride  3 mL Intravenous Q12H   Continuous Infusions: . sodium chloride 125 mL/hr at 09/09/15 2242    Principal Problem:   CAP (community acquired pneumonia) Active Problems:   Other specified hypothyroidism   Medullary carcinoma ([place])   Adrenal insufficiency ([place])   Hypotension   Dehydration   Hyponatremia    Hypokalemia   Time spent 20 minutes   By signing my name below, I, Rosalie Doctor attest that this documentation has been prepared under the direction and in the presence of Murray Hodgkins, MD Electronically signed: Rosalie Doctor, Scribe.  09/10/2015 2:23pm  I personally performed the services described in this documentation. All medical record entries made by the scribe were at my direction. I have reviewed the chart and agree that the record reflects my personal performance and is accurate and complete. Murray Hodgkins, MD

## 2015-09-10 NOTE — Care Management Note (Signed)
Case Management Note  Patient Details  Name: James Hoffman MRN: [id number] Date of Birth: May 24, 1996  Subjective/Objective:                  Pt is from home, lives with mother. Pt is mostly ind with ADL's but does require assistance at times. Pt has walker and wheelchair. Pt needs a shower chair and would like it from [facility].   Action/Plan: Pt plans to return home with self care at DC. Shower stool ordered and Romualdo Bolk, of [facility], made aware and will obtain pt info from chart. Pearletha Furl will be delivered to pt. No further CM needs anticipated.   Expected Discharge Date:  09/10/15               Expected Discharge Plan:  Home/Self Care  In-House Referral:  NA  Discharge planning Services  CM Consult  Post Acute Care Choice:  Durable Medical Equipment Choice offered to:  Patient  DME Arranged:  Shower stool DME Agency:     HH Arranged:    [place] Agency:     Status of Service:  Completed, signed off  Medicare Important Message Given:    Date Medicare IM Given:    Medicare IM give by:    Date Additional Medicare IM Given:    Additional Medicare Important Message give by:     If discussed at [place] of Stay Meetings, dates discussed:    Additional Comments:  Sherald Barge, RN 09/10/2015, 10:01 AM

## 2015-09-10 NOTE — Care Management (Signed)
Patient requires frequent re-positioning of the body in ways that cannot be achieved with an ordinary bed or wedge pillow, to eliminate pain, reduce pressure, and the head of the bed to be elevated more than 30 degrees most of the time due to medulloblastoma of the brainstem

## 2015-09-10 NOTE — Care Management Note (Signed)
Case Management Note  Patient Details  Name: James Hoffman MRN: [id number] Date of Birth: 1996-03-14  Subjective/Objective:                    Action/Plan: Pt ordered hospital bed, [facility] made aware and will have be delivered to pt home. No further CM needs.   Expected Discharge Date:  09/10/15               Expected Discharge Plan:  Home/Self Care  In-House Referral:  NA  Discharge planning Services  CM Consult  Post Acute Care Choice:  Durable Medical Equipment Choice offered to:  Patient  DME Arranged:  Shower stool, Hospital bed DME Agency:     HH Arranged:    [place] Agency:     Status of Service:  Completed, signed off  Medicare Important Message Given:    Date Medicare IM Given:    Medicare IM give by:    Date Additional Medicare IM Given:    Additional Medicare Important Message give by:     If discussed at [place] of Stay Meetings, dates discussed:    Additional Comments:  Sherald Barge, RN 09/10/2015, 11:33 AM

## 2015-09-11 DIAGNOSIS — E2740 Unspecified adrenocortical insufficiency: Secondary | ICD-10-CM

## 2015-09-11 DIAGNOSIS — E860 Dehydration: Secondary | ICD-10-CM

## 2015-09-11 DIAGNOSIS — J189 Pneumonia, unspecified organism: Principal | ICD-10-CM

## 2015-09-11 DIAGNOSIS — E039 Hypothyroidism, unspecified: Secondary | ICD-10-CM

## 2015-09-11 LAB — HIV ANTIBODY (ROUTINE TESTING W REFLEX): HIV SCREEN 4TH GENERATION: NONREACTIVE

## 2015-09-11 MED ORDER — PREDNISONE 5 MG PO TABS: ORAL_TABLET | ORAL | Status: DC

## 2015-09-11 MED ORDER — CEFUROXIME AXETIL 250 MG PO TABS: 500.0000 mg | ORAL_TABLET | Freq: Two times a day (BID) | ORAL | Status: DC

## 2015-09-11 MED ORDER — AZITHROMYCIN 250 MG PO TABS: 500.0000 mg | ORAL_TABLET | Freq: Every day | ORAL | Status: DC

## 2015-09-11 MED ORDER — LEVOTHYROXINE SODIUM 88 MCG PO TABS: 88.0000 ug | ORAL_TABLET | Freq: Every day | ORAL | Status: DC

## 2015-09-11 MED ORDER — CEFUROXIME AXETIL 500 MG PO TABS: 500.0000 mg | ORAL_TABLET | Freq: Two times a day (BID) | ORAL | Status: DC

## 2015-09-11 MED ORDER — AZITHROMYCIN 500 MG PO TABS: 500.0000 mg | ORAL_TABLET | Freq: Every day | ORAL | Status: DC

## 2015-09-11 NOTE — Progress Notes (Signed)
PROGRESS NOTE  James Hoffman:[id number] DOB: [birth year] DOA: 09/09/2015 PCP: Kenn File, MD  Summary: 20 yom with a hx of hypotension and medullary CA presented with a productive cough, rhinorrhea, and sore throat. In the ED, labs revealed a mild leukocytosis, hyponatremia, and hypokalemia. CXR consistent with a subtle PNA. He was admitted for further management.   Assessment/Plan: 1. CAP, essentially resolved. WBC has normalized. Lactic acid was normal. He is afebrile with stable O2 sats. BC pending but reveal no growth to date.  2. Dehydration, likely due to #3. Appears resolved.  3. FTT, severe malnutrition. Patient drinking and tolerating a normal diet well.  4. Normocytic anemia, stable. Family reports h/o chronic anemia. No obvious bleeding.  5. Medulloblastoma of the brainstem s/p resection and radiation in 9562, complicated by development of hydrocephalus; s/p VP shunt. CT w/o acute process but notes possible overshunting and 66mm and 52mm collections in subdura. EDP consulted patient's neurosurgeon who requested him to follow up in 1 week. 6. Adrenal insufficiency without acute crisis. Continue Prednisone. 7. Chronic hypotension, stable.  8. Hypothyroidism without medication for weeks. TSH 5.549. Resumed Levothyroxine. 9. Hyponatremia, resolved with IVF. 10. Hypokalemia, repleted.  11. Hypomagnesemia repleted.   12. Tobacco use disorder   Overall improved. Discharge home today on oral abx  Increased Prednisone next 4 days  Rx for prednisone, levothyroxine and abx  Mother has Dr. Prince Rome (neurosurgeon) number and will arrange follow-up  Code Status: Full DVT prophylaxis: Lovenox Family Communication:  Mother at bedside. Discussed with patient who understands and has no concerns at this time. Disposition Plan: Discharge home today.   Murray Hodgkins, MD  [facility]  Pager [phone] If 7PM-7AM, please contact night-coverage at [email or web address], password  [facility] 09/11/2015, 7:38 AM  LOS: 1 day   Consultants:    Procedures:    Antibiotics:  Rocephin 10/27>>10/28  Zithromax 10/27>>10/31  Ceftin 10/29>>11/3  HPI/Subjective: Feels much improved. Still has a residual mild cough but denies any SOB or pain. Eating fine.  Objective: Filed Vitals:   09/09/15 2322 09/10/15 0536 09/10/15 2158 09/11/15 0541  BP: 97/69 90/75 100/72 106/76  Pulse: 98 66 64 69  Temp: 98.4 F (36.9 C) 97.9 F (36.6 C) 97.6 F (36.4 C) 98 F (36.7 C)  TempSrc: Oral Oral Oral Oral  Resp: 18 18 20 20   Height: 5\' 10"  (1.778 m)     Weight: 48.626 kg (107 lb 3.2 oz)     SpO2: 100% 100% 99% 99%    Intake/Output Summary (Last 24 hours) at 09/11/15 0738 Last data filed at 09/11/15 0543  Gross per 24 hour  Intake    723 ml  Output   1550 ml  Net   -827 ml     Filed Weights   09/09/15 1519 09/09/15 2322  Weight: 61.236 kg (135 lb) 48.626 kg (107 lb 3.2 oz)    Exam:     VSS, afebrile, not hypoxic General:  Appears calm and comfortable Cardiovascular: RRR, no m/r/g. No LE edema. Respiratory: CTA bilaterally, no w/r/r. Normal respiratory effort. Psychiatric: grossly normal mood and affect, speech fluent and appropriate  New data reviewed:     Pertinent data since admission:  CXR IMPRESSION: Subtle left lower lobe airspace consolidation, which may represent a developing pneumonia. Probable small bilateral pleural effusions  EKG SR.  Pending data:  BC  Scheduled Meds: . azithromycin  500 mg Oral Q24H  . cefTRIAXone (ROCEPHIN)  IV  1 g Intravenous Q24H  . enoxaparin (LOVENOX) injection  40 mg Subcutaneous Q24H  . levothyroxine  88 mcg Oral QAC breakfast  . predniSONE  40 mg Oral Daily  . sodium chloride  3 mL Intravenous Q12H   Continuous Infusions: . sodium chloride 125 mL/hr at 09/10/15 1102    Principal Problem:   CAP (community acquired pneumonia) Active Problems:   Other specified hypothyroidism   Medullary carcinoma  ([place])   Adrenal insufficiency (HCC)   Hypotension   Dehydration   Hyponatremia   Hypokalemia   Pneumonia   PNA (pneumonia)   By signing my name below, I, Rosalie Doctor attest that this documentation has been prepared under the direction and in the presence of Murray Hodgkins, MD Electronically signed: Rosalie Doctor, Scribe.  09/11/2015 8:49am  I personally performed the services described in this documentation. All medical record entries made by the scribe were at my direction. I have reviewed the chart and agree that the record reflects my personal performance and is accurate and complete. Murray Hodgkins, MD

## 2015-09-11 NOTE — Progress Notes (Signed)
D/c instructions reviewed with patient and mother by Sharene Skeans, RN.  Verbalized understanding. Pt dc'd to home with mother.

## 2015-09-11 NOTE — Discharge Summary (Signed)
Physician Discharge Summary  James Hoffman [id number] DOB: 05-Feb-1996 DOA: 09/09/2015  PCP: James File, MD  Admit date: 09/09/2015 Discharge date: 09/11/2015  Recommendations for Outpatient Follow-up:  1. Follow up resolution of PNA.   2. Follow-up normocytic anemia. Consider repeat CBC. 3. Follow up with neurosurgeon in 1 week (patient's mother will arrange).     Follow-up Information    Follow up with James File, MD. Schedule an appointment as soon as possible for a visit in 1 week.   Specialty:  Family Medicine   Contact information:   [place] [address] [phone]       Follow up with [facility], MD. Schedule an appointment as soon as possible for a visit in 1 week.   Specialty:  Neurosurgery   Contact information:   [place] [address] [phone]        Discharge Diagnoses:  1. CAP  2. Dehydration  3. FTT  4. Normocytic anemia. 5. 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 6. Adrenal insufficiency without acute crisis. 7. Chronic hypotension. 8. Hypothyroidism   9. Hyponatremia. 10. Hypokalemia. 11. Hypomagnesemia.  12. Tobacco use disorder  Discharge Condition: Improved Disposition: Home  Diet recommendation: Regular  Filed Weights   09/09/15 1519 09/09/15 2322  Weight: 61.236 kg (135 lb) 48.626 kg (107 lb 3.2 oz)    History of present illness:  77 yom with a hx of hypotension and medullary CA presented with a productive cough, rhinorrhea, and sore throat. In the ED, labs revealed a mild leukocytosis, hyponatremia, and hypokalemia. CXR consistent with a subtle PNA. He was admitted for further management.   Hospital Course:  CAP essentially resolved with IV abx. WBC normalized and patient is breathing comfortably on RA with stable VS. Dehydration and FTT improved with IVF and a normal diet.  Hyponatremia, hypokalemia and hypomagnesemia were repleted. Patient had not been taking his Levothyroxine so that was restarted. Hospitalization was uncomplicated.  Individual issues as below:  1. CAP, essentially resolved. WBC has normalized. Lactic acid was normal. He is afebrile with stable O2 sats. BC pending but reveal no growth to date. 2. Dehydration, likely due to #3. Appears resolved.  3. FTT, severe malnutrition. Patient drinking and tolerating a normal diet well.  4. Normocytic anemia, stable. Family reports h/o chronic anemia. No obvious bleeding.  5. 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 CT w/o acute process but notes possible overshunting and 3mm and 11mm collections in subdura. EDP consulted patient's neurosurgeon who requested him to follow up in 1 week. 6. Adrenal insufficiency without acute crisis. Continue Prednisone. 7. Chronic hypotension, stable.  8. Hypothyroidism without medication for weeks. TSH 5.549. Resumed Levothyroxine. 9. Hyponatremia, resolved with IVF. 10. Hypokalemia, repleted.  11. Hypomagnesemia repleted.  12. Tobacco use disorder  Consultants:  none  Procedures:  none  Antibiotics: 13. Rocephin 10/27>>10/28 14. Zithromax 10/27>>10/31 15. Ceftin 10/29>>11/3   Discharge Instructions Discharge Instructions    Activity as tolerated - No restrictions    Complete by:  As directed      Diet general    Complete by:  As directed      Discharge instructions    Complete by:  As directed   Call your physician or seek immediate medical attention for fever, shortness of breath, cough, sleeping too much or worsening of condition.  Current Discharge Medication List    START taking these medications   Details  azithromycin (ZITHROMAX) 500 MG tablet Take 1 tablet (500 mg total) by mouth daily. At dinner Qty: 3 tablet, Refills: 0    cefUROXime  (CEFTIN) 500 MG tablet Take 1 tablet (500 mg total) by mouth 2 (two) times daily with a meal. Qty: 11 tablet, Refills: 0      CONTINUE these medications which have CHANGED   Details  levothyroxine (SYNTHROID, LEVOTHROID) 88 MCG tablet Take 1 tablet (88 mcg total) by mouth daily before breakfast. Qty: 30 tablet, Refills: 0   Associated Diagnoses: Other specified hypothyroidism    predniSONE (DELTASONE) 5 MG tablet Take 4 tablets daily 10/30 and 10/31, then 2 tablets daily 11/1 and 11/2. Then resume chronic 5mg  PO daily. Qty: 42 tablet, Refills: 0       No Known Allergies  The results of significant diagnostics from this hospitalization (including imaging, microbiology, ancillary and laboratory) are listed below for reference.    Significant Diagnostic Studies: Dg Chest 2 View  09/09/2015  CLINICAL DATA:  Dehydration, loss of appetite. Chronic productive cough. EXAM: CHEST  2 VIEW COMPARISON:  None. FINDINGS: Radiopaque tubing transverses the right thorax. Cardiomediastinal silhouette is normal. Mediastinal contours appear intact. There is an ill-defined focal airspace consolidation in the left lung base, which may represent a developing pneumonia. There may be small bilateral pleural effusions. Osseous structures are without acute abnormality. Soft tissues are grossly normal. IMPRESSION: Subtle left lower lobe airspace consolidation, which may represent a developing pneumonia. Probable small bilateral pleural effusions. Electronically Signed   By: Fidela Salisbury M.D.   On: 09/09/2015 16:31   Dg Abd 1 View  09/09/2015  CLINICAL DATA:  Abdominal pain with loss of appetite EXAM: ABDOMEN - 1 VIEW COMPARISON:  None. FINDINGS: There is no bowel dilatation or air-fluid level suggesting obstruction. No free air is seen on this supine examination. There is moderate stool in the colon. There are no abnormal calcifications. IMPRESSION: Bowel gas pattern unremarkable. Electronically Signed   By:  Lowella Grip III M.D.   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 COMPARISON:  CT head dated 09/07/2011. FINDINGS: Bilateral extra-axial subdural collections, measuring 7 mm on the right and 5 mm on the left (series 2/image 25). No evidence of parenchymal hemorrhage. No mass lesion, mass effect, or midline shift. No CT evidence of acute infarction. Cerebral volume is within normal limits. Ventricles are decompressed with a right parietal approach ventriculostomy catheter terminating at the foramen of Monro. The visualized paranasal sinuses are essentially clear. The mastoid air cells are unopacified. Status post suboccipital craniectomy. Postsurgical changes involving the cerebellar vermis/posterior fossa. No evidence of calvarial fracture. IMPRESSION: Bilateral extra-axial subdural collections, measuring 7 mm on the right and 5 mm on the left. Ventricles are decompressed with a right parietal approach ventriculostomy catheter. Overall, in the absence of a traumatic history, this appearance raises the possibility of intracranial hypotension secondary to overshunting. These results were called by telephone at the time of interpretation on 09/09/2015 at 4:12 pm to Dr. Brantley Stage, who verbally acknowledged these results. Electronically Signed   By: Julian Hy M.D.   On: 09/09/2015  16:14    Microbiology: Recent Results (from the past 240 hour(s))  Culture, blood (routine x 2) Call MD if unable to obtain prior to antibiotics being given     Status: None (Preliminary result)   Collection Time: 09/09/15  7:45 PM  Result Value Ref Range Status   Specimen Description  BLOOD RIGHT HAND  Final   Special Requests BOTTLES DRAWN AEROBIC AND ANAEROBIC 6CC  Final   Culture NO GROWTH 2 DAYS  Final   Report Status PENDING  Incomplete  Culture, blood (routine x 2) Call MD if unable to obtain prior to antibiotics being given     Status: None (Preliminary result)   Collection Time: 09/09/15  7:50 PM  Result Value Ref Range Status   Specimen Description BLOOD LEFT ARM  Final   Special Requests BOTTLES DRAWN AEROBIC AND ANAEROBIC 6CC  Final   Culture NO GROWTH 2 DAYS  Final   Report Status PENDING  Incomplete     Labs: Basic Metabolic Panel:  Recent Labs Lab 09/09/15 1426 09/09/15 1542 09/10/15 0533  NA 133* 131* 135  K 2.8* 2.6* 4.4  CL 87* 90* 101  CO2 27 33* 28  GLUCOSE 98 99 96  BUN 5* 7 <5*  CREATININE 0.56* 0.60* 0.61  CALCIUM 8.8 9.1 8.3*  MG  --  1.4*  --   PHOS  --  2.6  --    Liver Function Tests:  Recent Labs Lab 09/09/15 1426 09/09/15 1542  AST 19 22  ALT 6 8*  ALKPHOS 92 85  BILITOT 0.4 0.6  PROT 6.2 6.8  ALBUMIN 3.7 3.6   CBC:  Recent Labs Lab 09/09/15 1426 09/09/15 1542 09/10/15 0533  WBC 9.3 12.4* 7.4  NEUTROABS 5.8 9.2*  --   HGB  --  10.8* 8.5*  HCT 29.6* 30.0* 24.0*  MCV  --  89.6 91.3  PLT  --  291 243    Principal Problem:   CAP (community acquired pneumonia) Active Problems:   Other specified hypothyroidism   Medullary carcinoma ([place])   Adrenal insufficiency (HCC)   Hypotension   Dehydration   Hyponatremia   Hypokalemia   Pneumonia   PNA (pneumonia)   Time coordinating discharge: 25 minutes  Signed:  Murray Hodgkins, MD [facility] 09/11/2015, 11:10 AM  By signing my name below, I, Rosalie Doctor attest that this documentation has been prepared under the direction and in the presence of Murray Hodgkins, MD Electronically signed: Rosalie Doctor, Scribe.  09/11/2015  I personally performed the services described in this documentation. All medical record entries made by the  scribe were at my direction. I have reviewed the chart and agree that the record reflects my personal performance and is accurate and complete. Murray Hodgkins, MD

## 2015-09-13 ENCOUNTER — Telehealth: Payer: Self-pay | Admitting: *Deleted

## 2015-09-13 NOTE — Telephone Encounter (Signed)
Spoke with patient's mother. He was admitted to [facility] on 09/09/15 and discharged on 09/11/15. Discharge diagnosis were pneumonia, anemia, and dehydration.  Patient has a complicated medical history.  Follow up appt scheduled for 09/17/15 with Dr Wendi Snipes.  They will call back if anything changes.

## 2015-09-14 LAB — CULTURE, BLOOD (ROUTINE X 2)
Culture: NO GROWTH
Culture: NO GROWTH

## 2015-09-17 ENCOUNTER — Encounter: Payer: Self-pay | Admitting: Family Medicine

## 2015-09-17 ENCOUNTER — Ambulatory Visit (INDEPENDENT_AMBULATORY_CARE_PROVIDER_SITE_OTHER): Payer: Medicaid Other | Admitting: Family Medicine

## 2015-09-17 VITALS — BP 104/62 | HR 89 | Temp 97.8°F | Ht 70.0 in | Wt 116.6 lb

## 2015-09-17 DIAGNOSIS — E876 Hypokalemia: Secondary | ICD-10-CM | POA: Diagnosis not present

## 2015-09-17 DIAGNOSIS — R29898 Other symptoms and signs involving the musculoskeletal system: Secondary | ICD-10-CM

## 2015-09-17 DIAGNOSIS — J189 Pneumonia, unspecified organism: Secondary | ICD-10-CM

## 2015-09-17 DIAGNOSIS — C801 Malignant (primary) neoplasm, unspecified: Secondary | ICD-10-CM | POA: Diagnosis not present

## 2015-09-17 NOTE — Progress Notes (Signed)
   HPI  Patient presents today for hospital follow-up.  He was admitted [facility] 10 or 27th for any cardiac pneumonia. He was treated with IV antibiotics and transitioned to oral azithromycin and Ceftin. He has completed his course of antibiotics and feels much better. He states his appetite is improved greatly, his cough is resolved, and he denies any dyspnea. He also denies chest pain.  Follow-up recently due to difficulty with transportation. He would like to try physical therapy again  He agrees to call his neurosurgeon for follow-up.  PMH: Smoking status noted ROS: Per HPI  Objective: BP 104/62 mmHg  Pulse 89  Temp(Src) 97.8 F (36.6 C) (Oral)  Ht $R'5\' 10"'Hs$  (1.778 m)  Wt 116 lb 9.6 oz (52.889 kg)  BMI 16.73 kg/m2 Gen: NAD, alert, cooperative with exam, smiling throughout exam, sitting in wheelchair HEENT: NCAT,PERRL, TM normal on the left, right obscured by cerumen, oropharynx clear, nares clear CV: RRR, good S1/S2, no murmur Resp: CTABL, no wheezes, non-labored Abd: SNTND, BS present, no guarding or organomegaly Ext: Thin cachectic appearing legs, no edema Neuro: Alert and oriented, strength or/5 and symmetric in bilateral lower extremities, sensation intact in all 4 extremities  Assessment and plan:  # Community-acquired pneumonia Improved Consider repeat chest x-ray next visit  # Medulloblastoma the brainstem status post resection and chemotherapy. Physical deconditioning presumably due to the above He would like to try physical therapy again, refer for ambulatory physical therapy, he would also probably be appropriate for home health physical therapy  # Hypokalemia, normocytic anemia Repeat labs  # Adrenal insufficiency Tapering prednisone, continue 5 mg chronic dose Consider sending [facility] back [facility] the [facility]inology, we will discuss this at our next visit    Orders Placed This Encounter  Procedures  . CMP14+EGFR  . CBC with  Differential  . Ambulatory referral to Physical Therapy    Referral Priority:  Routine    Referral Type:  Physical Medicine    Referral Reason:  Specialty Services Required    Requested Specialty:  Physical Therapy    Number of Visits Requested:  [place], MD [facility] 09/17/2015, 4:04 PM

## 2015-09-17 NOTE — Patient Instructions (Addendum)
Great to see you!  I think you are ok to go to physical therapoy if you would like to   Please call your surgeon at [facility] for a follow up.   Follow up with [facility], MD. Schedule an appointment as soon as possible for a visit in 1 week.    Specialty: Neurosurgery   Contact information:   [place] [address] [phone]        Come back to see me in 6 to 8 weeks

## 2015-09-18 LAB — CBC WITH DIFFERENTIAL/PLATELET
BASOS: 0 %
Basophils Absolute: 0 10*3/uL (ref 0.0–0.2)
EOS (ABSOLUTE): 0.2 10*3/uL (ref 0.0–0.4)
EOS: 2 %
HEMATOCRIT: 29.8 % — AB (ref 37.5–51.0)
Hemoglobin: 10.1 g/dL — ABNORMAL LOW (ref 12.6–17.7)
IMMATURE GRANS (ABS): 0.1 10*3/uL (ref 0.0–0.1)
IMMATURE GRANULOCYTES: 1 %
LYMPHS: 16 %
Lymphocytes Absolute: 1.4 10*3/uL (ref 0.7–3.1)
MCH: 32.2 pg (ref 26.6–33.0)
MCHC: 33.9 g/dL (ref 31.5–35.7)
MCV: 95 fL (ref 79–97)
MONOS ABS: 0.5 10*3/uL (ref 0.1–0.9)
Monocytes: 6 %
NEUTROS ABS: 6.9 10*3/uL (ref 1.4–7.0)
NEUTROS PCT: 75 %
Platelets: 294 10*3/uL (ref 150–379)
RBC: 3.14 x10E6/uL — ABNORMAL LOW (ref 4.14–5.80)
RDW: 16 % — ABNORMAL HIGH (ref 12.3–15.4)
WBC: 9.1 10*3/uL (ref 3.4–10.8)

## 2015-09-18 LAB — CMP14+EGFR
A/G RATIO: 1.7 (ref 1.1–2.5)
ALT: 19 IU/L (ref 0–44)
AST: 26 IU/L (ref 0–40)
Albumin: 3.6 g/dL (ref 3.5–5.5)
Alkaline Phosphatase: 74 IU/L (ref 39–117)
BUN/Creatinine Ratio: 8 (ref 8–19)
BUN: 5 mg/dL — ABNORMAL LOW (ref 6–20)
Bilirubin Total: 0.3 mg/dL (ref 0.0–1.2)
CALCIUM: 8.7 mg/dL (ref 8.7–10.2)
CO2: 28 mmol/L (ref 18–29)
CREATININE: 0.61 mg/dL — AB (ref 0.76–1.27)
Chloride: 88 mmol/L — ABNORMAL LOW (ref 97–106)
GFR, EST AFRICAN AMERICAN: 167 mL/min/{1.73_m2} (ref >59)
GFR, EST NON AFRICAN AMERICAN: 145 mL/min/{1.73_m2} (ref >59)
GLOBULIN, TOTAL: 2.1 g/dL (ref 1.5–4.5)
Glucose: 80 mg/dL (ref 65–99)
POTASSIUM: 3.6 mmol/L (ref 3.5–5.2)
SODIUM: 135 mmol/L — AB (ref 136–144)
Total Protein: 5.7 g/dL — ABNORMAL LOW (ref 6.0–8.5)

## 2015-10-14 ENCOUNTER — Ambulatory Visit: Payer: Medicaid Other | Attending: Family Medicine | Admitting: Physical Therapy

## 2015-10-14 DIAGNOSIS — R29898 Other symptoms and signs involving the musculoskeletal system: Secondary | ICD-10-CM | POA: Diagnosis present

## 2015-10-14 DIAGNOSIS — R269 Unspecified abnormalities of gait and mobility: Secondary | ICD-10-CM | POA: Diagnosis present

## 2015-10-14 DIAGNOSIS — R2681 Unsteadiness on feet: Secondary | ICD-10-CM

## 2015-10-14 NOTE — Addendum Note (Signed)
Addended by: Percilla Tweten, [place] W on: 10/14/2015 06:20 PM   Modules accepted: Orders

## 2015-10-14 NOTE — Therapy (Signed)
[facility] [place], [place], [address] Phone: [phone]   Fax:  [phone]  Physical Therapy Evaluation  Patient Details  Name: James Hoffman MRN: [id number] Date of Birth: May 10, 1996 Referring Provider: Kenn File MD.  Encounter Date: 10/14/2015      PT End of Session - 10/14/15 1807    Visit Number 1   Number of Visits 16   Date for PT Re-Evaluation 12/09/15   PT Start Time 0150   PT Stop Time 0229   PT Time Calculation (min) 39 min   Activity Tolerance Patient limited by fatigue   Behavior During Therapy [facility] for tasks assessed/performed      Past Medical History  Diagnosis Date  . Thyroid disease   . Cancer ([place])     brain tumor on brain stem  . Adrenal insufficiency ([place])   . Hydrocephalus     Past Surgical History  Procedure Laterality Date  . Brain tumor removal  August 2012    There were no vitals filed for this visit.  Visit Diagnosis:  Unsteadiness  Abnormality of gait  Weakness of both lower extremities      Subjective Assessment - 10/14/15 1359    Subjective I've been dealing with this since I was 19 years old.   Limitations Walking   How long can you walk comfortably? Just around house.   Patient Stated Goals Would like to better.   Currently in Pain? No/denies            [facility] PT Assessment - 10/14/15 0001    Assessment   Medical Diagnosis Muscular deconditioning.   Referring Provider Kenn File MD.   Onset Date/Surgical Date --  Since age 19.   Precautions   Precautions Fall   Precaution Comments High fall risk.  Please assist patient and do not leave unattended please.  please monitor HR and 02 sat.   Restrictions   Weight Bearing Restrictions No   Balance Screen   Has the patient fallen in the past 6 months No   Has the patient had a decrease in activity level because of a fear of falling?  No   Is the patient reluctant to leave their home because of  a fear of falling?  No   Home Environment   Living Environment Private residence   Prior Function   Level of Independence Independent with household mobility with device  Walks touching walls in home.   Cognition   Overall Cognitive Status Within Functional Limits for tasks assessed   Sensation   Proprioception Impaired by gross assessment   Coordination   Gross Motor Movements are Fluid and Coordinated No   ROM / Strength   AROM / PROM / Strength AROM;Strength   AROM   Overall AROM Comments The patient overall bilateral LE active range of motion is [facility] though his movements are axtaxic in nature left > right.   Strength   Overall Strength Comments Left hip strength= 3- to 3/5; left knee flex/ext strength= 3 to 3+/5.  Left ankle dorsiflexion to neutral and graded at 3-/5.  the patient has AFO's but does not use them.  The patient right hip and knee strength= 4-/5 and right ankle= 3+ to 4-/5.   Special Tests    Special Tests --  Positive Romberg test.   Transfers   Transfers Sit to Stand;Stand to Sit   Sit to Stand 4: Min guard   Stand to Sit 5: Supervision   Ambulation/Gait  Ambulation/Gait Yes   Ambulation/Gait Assistance 3: Mod assist   Gait Pattern Decreased stride length;Decreased dorsiflexion - right;Decreased dorsiflexion - left;Shuffle;Scissoring;Ataxic;Trunk flexed;Poor foot clearance - left;Poor foot clearance - right   Gait Comments Ambulated with patient using HHA.  Gait cycle is very ataxic.   Functional Gait  Assessment   Gait assessed  Yes                   [facility] Adult PT Treatment/Exercise - 10/14/15 0001    Exercises   Exercises Knee/Hip   Knee/Hip Exercises: Aerobic   Nustep Level 3 x 8 minutes.  HR increase to 141 and 02 sat= 98%.                     PT Long Term Goals - 10/14/15 1809    PT LONG TERM GOAL #1   Title Ind with a HEP.   Baseline No knowledge of appropriate ther ex.   Time 8   Period Weeks   Status New   PT LONG  TERM GOAL #2   Title Increase bilateral LE strength to a solid 4/5.   Baseline Bilateral LE strength grades from 3- to 4-/5.   Time 8   Period Weeks   Status New   PT LONG TERM GOAL #3   Title Sit to stand with CGA.   Baseline Up to moderate assist required today.   Time 8   Period Weeks   Status New   PT LONG TERM GOAL #4   Title Walk in clinic with CGA/SBA with a cane 500 feet.   Baseline Patient requires HHA to ambulate safely.   Time 8   Period Weeks   Status New               Plan - 10/14/15 1400    Clinical Impression Statement The patient was diagnosed with Medullary carcinoma when he was 19 years old.  He states he walks holding onto walls but would like to walk by himself with a cane one day.  Patient has AFO's but does not wear them.   Pt will benefit from skilled therapeutic intervention in order to improve on the following deficits Abnormal gait   Clinical Impairments Affecting Rehab Potential High fall potential.  HOH.   PT Next Visit Plan Bilateral lLE strengthening.  Gait and balance activities.   Consulted and Agree with Plan of Care Patient         Problem List Patient Active Problem List   Diagnosis Date Noted  . Muscular deconditioning 09/17/2015  . Other specified hypothyroidism 09/09/2015  . CAP (community acquired pneumonia) 09/09/2015  . Medullary carcinoma ([place]) 09/09/2015  . Adrenal insufficiency ([place]) 09/09/2015  . Hypotension 09/09/2015  . Hyponatremia 09/09/2015  . Hypokalemia 09/09/2015    James Hoffman, [place] MPT 10/14/2015, 6:14 PM  [facility] [address] [place], [place], [address] Phone: [phone]   Fax:  [phone]  Name: James Hoffman MRN: [id number] Date of Birth: 11-02-1996

## 2015-10-26 ENCOUNTER — Other Ambulatory Visit: Payer: Self-pay | Admitting: Family Medicine

## 2015-10-27 NOTE — Telephone Encounter (Signed)
Last seen 09/17/15  Dr Wendi Snipes

## 2015-10-29 NOTE — Telephone Encounter (Signed)
He is just needing the daily that he usually takes

## 2015-11-02 ENCOUNTER — Ambulatory Visit: Payer: Medicaid Other | Admitting: *Deleted

## 2015-11-02 DIAGNOSIS — R269 Unspecified abnormalities of gait and mobility: Secondary | ICD-10-CM

## 2015-11-02 DIAGNOSIS — R2681 Unsteadiness on feet: Secondary | ICD-10-CM

## 2015-11-02 DIAGNOSIS — R29898 Other symptoms and signs involving the musculoskeletal system: Secondary | ICD-10-CM

## 2015-11-02 NOTE — Therapy (Signed)
[facility] [place], [place], [address] Phone: [phone]   Fax:  [phone]  Physical Therapy Treatment  Patient Details  Name: James Hoffman MRN: [id number] Date of Birth: [birth year] Referring Provider: Kenn File MD.  Encounter Date: 11/02/2015      PT End of Session - 11/02/15 1617    Visit Number 2   Number of Visits 16   Date for PT Re-Evaluation 12/09/15   PT Start Time [id number]   PT Stop Time 1605   PT Time Calculation (min) 50 min   Activity Tolerance Patient limited by fatigue   Behavior During Therapy [facility] for tasks assessed/performed      Past Medical History  Diagnosis Date  . Thyroid disease   . Cancer ([place])     brain tumor on brain stem  . Adrenal insufficiency ([place])   . Hydrocephalus     Past Surgical History  Procedure Laterality Date  . Brain tumor removal  August 2012    There were no vitals filed for this visit.  Visit Diagnosis:  Unsteadiness  Abnormality of gait  Weakness of both lower extremities      Subjective Assessment - 11/02/15 1533    Subjective I've been dealing with this since I was 19 years old. Not using an assistive device to walk at home   Limitations Walking   How long can you walk comfortably? Just around house.   Patient Stated Goals Would like to better.                         [place] [facility] Adult PT Treatment/Exercise - 11/02/15 0001    Ambulation/Gait   Ambulation/Gait Yes   Ambulation/Gait Assistance 3: Mod assist   Gait Pattern Decreased stride length;Decreased dorsiflexion - right;Decreased dorsiflexion - left;Shuffle;Scissoring;Ataxic;Trunk flexed;Poor foot clearance - left;Poor foot clearance - right   Curb 4: Min assist   Gait Comments Ambulated with patient using HHA.  Gait cycle is very ataxic.   Exercises   Exercises Knee/Hip   Knee/Hip Exercises: Aerobic   Nustep Level 4 x 10 minutes.  HR increase to 141 and 02 sat= 98%.   Knee/Hip Exercises: Standing   Knee Flexion AROM;3 sets;10 reps  TOE TAPS 6in block   Rocker Board 3 minutes  calf stretching and balance CGA   Knee/Hip Exercises: Supine   Bridges AROM;10 reps;1 set   Bridges with [employer] Strengthening;2 sets;10 reps   Bridges with [employer] Strengthening;2 sets;10 reps  yellow tubing   Manual Therapy   Manual Therapy Passive ROM   Passive ROM PNF/AAROM to both LEs diagonal patterns and sagital plane                     PT Long Term Goals - 10/14/15 1809    PT LONG TERM GOAL #1   Title Ind with a HEP.   Baseline No knowledge of appropriate ther ex.   Time 8   Period Weeks   Status New   PT LONG TERM GOAL #2   Title Increase bilateral LE strength to a solid 4/5.   Baseline Bilateral LE strength grades from 3- to 4-/5.   Time 8   Period Weeks   Status New   PT LONG TERM GOAL #3   Title Sit to stand with CGA.   Baseline Up to moderate assist required today.   Time 8   Period Weeks   Status New   PT LONG  TERM GOAL #4   Title Walk in clinic with CGA/SBA with a cane 500 feet.   Baseline Patient requires HHA to ambulate safely.   Time 8   Period Weeks   Status New               Plan - 11/02/15 1532    Clinical Impression Statement Pt did fairly well with Rx today. His gait is very ataxic and has a harder time with his LT LE than his RT. He did fairlly well with 1 HHA in clinic. Standing exs in // bars were a little challenging. His heel cords are tight bilaterally and was sore after rocker board. Pt did well with mat exs and was challenged with bridging act.s   Pt will benefit from skilled therapeutic intervention in order to improve on the following deficits Abnormal gait   Clinical Impairments Affecting Rehab Potential High fall potential.  HOH.   PT Next Visit Plan Bilateral lLE strengthening.  Gait and balance activities.   Consulted and Agree with Plan of Care Patient        Problem List Patient Active  Problem List   Diagnosis Date Noted  . Muscular deconditioning 09/17/2015  . Other specified hypothyroidism 09/09/2015  . CAP (community acquired pneumonia) 09/09/2015  . Medullary carcinoma ([place]) 09/09/2015  . Adrenal insufficiency ([place]) 09/09/2015  . Hypotension 09/09/2015  . Hyponatremia 09/09/2015  . Hypokalemia 09/09/2015    RAMSEUR,[name], PTA 11/02/2015, 4:43 PM  [facility] [place], [place], [address] Phone: [phone]   Fax:  [phone]  Name: James Hoffman MRN: [id number] Date of Birth: 02-06-1996

## 2015-11-04 ENCOUNTER — Encounter: Payer: Medicaid Other | Admitting: *Deleted

## 2015-11-09 ENCOUNTER — Ambulatory Visit: Payer: Medicaid Other | Admitting: *Deleted

## 2015-11-09 DIAGNOSIS — R269 Unspecified abnormalities of gait and mobility: Secondary | ICD-10-CM

## 2015-11-09 DIAGNOSIS — R2681 Unsteadiness on feet: Secondary | ICD-10-CM | POA: Diagnosis not present

## 2015-11-09 DIAGNOSIS — R29898 Other symptoms and signs involving the musculoskeletal system: Secondary | ICD-10-CM

## 2015-11-09 NOTE — Therapy (Signed)
[facility] [place], [place], [address] Phone: [phone]   Fax:  [phone]  Physical Therapy Treatment  Patient Details  Name: ZAIDEN MCCULLEN MRN: [id number] Date of Birth: May 09, 1996 Referring Provider: Kenn File MD.  Encounter Date: 11/09/2015      PT End of Session - 11/09/15 1525    Visit Number 3   Number of Visits 16   Date for PT Re-Evaluation 12/09/15   PT Start Time [id number]   PT Stop Time 1606   PT Time Calculation (min) 47 min      Past Medical History  Diagnosis Date  . Thyroid disease   . 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 Adrenal insufficiency ([place])   . Hydrocephalus     Past Surgical History  Procedure Laterality Date  . Brain tumor removal  August 2012    There were no vitals filed for this visit.  Visit Diagnosis:  Unsteadiness  Abnormality of gait  Weakness of both lower extremities      Subjective Assessment - 11/09/15 1522    Subjective I've been dealing with this since I was 19 years old. Not using an assistive device to walk at home. Did ok after last Rx   Limitations Walking   How long can you walk comfortably? Just around house.   Patient Stated Goals Would like to better.                         [place] Adult PT Treatment/Exercise - 11/09/15 0001    Ambulation/Gait   Ambulation/Gait Yes   Ambulation/Gait Assistance 3: Mod assist   Gait Pattern Decreased stride length;Decreased dorsiflexion - right;Decreased dorsiflexion - left;Shuffle;Scissoring;Ataxic;Trunk flexed;Poor foot clearance - left;Poor foot clearance - right   Curb 4: Min assist   Gait Comments Ambulated with patient using HHA.  Gait cycle is very ataxic.   Exercises   Exercises Knee/Hip   Knee/Hip Exercises: Aerobic   Nustep Level 5x 10 minutes.  HR increase to 140 and 02 sat= 98%.   Knee/Hip Exercises: Standing   Knee Flexion AROM;3 sets;10 reps  TOE TAPS 6in block   Rocker Board 3 minutes  calf stretching and balance CGA   Knee/Hip Exercises: Supine   Bridges AROM;10 reps;1 set   Bridges with [employer] Strengthening;2 sets;10 reps   Bridges with Clamshell Strengthening;2 sets;10 reps  yellow tubing   Straight Leg Raises AROM;Both;2 sets;10 reps   Manual Therapy   Manual Therapy Passive ROM   Passive ROM PNF/AAROM to both LEs diagonal patterns and sagital plane                     PT Long Term Goals - 10/14/15 1809    PT LONG TERM GOAL #1   Title Ind with a HEP.   Baseline No knowledge of appropriate ther ex.   Time 8   Period Weeks   Status New   PT LONG TERM GOAL #2   Title Increase bilateral LE strength to a solid 4/5.   Baseline Bilateral LE strength grades from 3- to 4-/5.   Time 8   Period Weeks   Status New   PT LONG TERM GOAL #3   Title Sit to stand with CGA.   Baseline Up to moderate assist required today.   Time 8   Period Weeks   Status New   PT LONG TERM GOAL #4  Title Walk in clinic with CGA/SBA with a cane 500 feet.   Baseline Patient requires HHA to ambulate safely.   Time 8   Period Weeks   Status New               Plan - 11/09/15 1531    Clinical Impression Statement Pt did fairly well with Rx today. We ambulated in clinic with HHAx1 with focus on LT LE control to improve gait pattern. He did a little bettter with MAT and // bar exs today with a little more control of LT LE with step-ups and manual PNF    Pt will benefit from skilled therapeutic intervention in order to improve on the following deficits Abnormal gait   Clinical Impairments Affecting Rehab Potential High fall potential.  HOH.   PT Next Visit Plan Bilateral lLE strengthening.  Gait and balance activities.   Consulted and Agree with Plan of Care Patient        Problem List Patient Active Problem List   Diagnosis Date Noted  . Muscular deconditioning 09/17/2015  . Other specified hypothyroidism 09/09/2015  . CAP  (community acquired pneumonia) 09/09/2015  . Medullary carcinoma ([place]) 09/09/2015  . Adrenal insufficiency ([place]) 09/09/2015  . Hypotension 09/09/2015  . Hyponatremia 09/09/2015  . Hypokalemia 09/09/2015    Irvin Lizama,[name], PTA 11/09/2015, 5:21 PM  [facility] [place], [place], [address] Phone: [phone]   Fax:  [phone]  Name: BENJAMAN AZOULAY MRN: [id number] Date of Birth: 03-21-1996

## 2015-11-16 ENCOUNTER — Encounter: Payer: Medicaid Other | Admitting: *Deleted

## 2015-11-18 ENCOUNTER — Encounter: Payer: Medicaid Other | Admitting: *Deleted

## 2015-11-23 ENCOUNTER — Encounter: Payer: Medicaid Other | Admitting: *Deleted

## 2015-11-25 ENCOUNTER — Ambulatory Visit: Payer: Medicaid Other | Attending: Family Medicine | Admitting: *Deleted

## 2015-11-25 VITALS — HR 91

## 2015-11-25 DIAGNOSIS — R2681 Unsteadiness on feet: Secondary | ICD-10-CM | POA: Insufficient documentation

## 2015-11-25 DIAGNOSIS — R29898 Other symptoms and signs involving the musculoskeletal system: Secondary | ICD-10-CM | POA: Diagnosis present

## 2015-11-25 DIAGNOSIS — R269 Unspecified abnormalities of gait and mobility: Secondary | ICD-10-CM

## 2015-11-25 NOTE — Therapy (Signed)
[facility] [place], [place], [address] Phone: [phone]   Fax:  [phone]  Physical Therapy Treatment  Patient Details  Name: James Hoffman MRN: [id number] Date of Birth: 1996/09/20 Referring Provider: Kenn File MD.  Encounter Date: 11/25/2015      PT End of Session - 11/25/15 1608    Visit Number 4   Number of Visits 16   Date for PT Re-Evaluation 12/09/15   PT Start Time [id number]   PT Stop Time 1601   PT Time Calculation (min) 46 min      Past Medical History  Diagnosis Date  . Thyroid disease   . 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 Adrenal insufficiency ([place])   . Hydrocephalus     Past Surgical History  Procedure Laterality Date  . Brain tumor removal  August 2012    Filed Vitals:   11/25/15 1542  Pulse: 91  SpO2: 98%    Visit Diagnosis:  Unsteadiness  Abnormality of gait  Weakness of both lower extremities      Subjective Assessment - 11/25/15 1519    Subjective My legs are feeling stronger today than they have been   Limitations Walking   How long can you walk comfortably? Just around house.   Patient Stated Goals Would like to better.                         [place] Adult PT Treatment/Exercise - 11/25/15 0001    Ambulation/Gait   Ambulation/Gait Yes   Ambulation/Gait Assistance 3: Mod assist   Gait Pattern Decreased stride length;Decreased dorsiflexion - right;Decreased dorsiflexion - left;Shuffle;Scissoring;Ataxic;Trunk flexed;Poor foot clearance - left;Poor foot clearance - right   Curb 4: Min assist   Gait Comments Ambulated with patient using HHA.  Gait cycle is very ataxic.   Exercises   Exercises Knee/Hip   Knee/Hip Exercises: Aerobic   Nustep Level 5x 12 minutes.    Knee/Hip Exercises: Standing   Knee Flexion AROM;3 sets;10 reps  TOE TAPS 6in block   Rocker Board 3 minutes  calf stretching and balance CGA   Knee/Hip Exercises: Supine   Bridges AROM;10 reps;1 set   Bridges with Diona Foley Squeeze Strengthening;10 reps;3 sets   Bridges with Clamshell Strengthening;2 sets;10 reps   red tubing   Straight Leg Raises AROM;Both;2 sets;10 reps   Manual Therapy   Manual Therapy Passive ROM   Passive ROM PNF/AAROM to both LEs diagonal patterns and sagital plane                     PT Long Term Goals - 11/25/15 1638    PT LONG TERM GOAL #1   Title Ind with a HEP.   Baseline No knowledge of appropriate ther ex.   Period Weeks   Status On-going   PT LONG TERM GOAL #2   Title Increase bilateral LE strength to a solid 4/5.   Baseline Bilateral LE strength grades from 3- to 4-/5.   Time 8   Period Weeks   Status On-going   PT LONG TERM GOAL #3   Title Sit to stand with CGA.   Baseline Up to moderate assist required today.   Time 8   Period Weeks   Status On-going   PT LONG TERM GOAL #4   Title Walk in clinic with CGA/SBA with a cane 500 feet.   Baseline Patient requires HHA to ambulate safely.  Time 8   Period Weeks   Status On-going               Plan - 11/25/15 1611    Clinical Impression Statement Pt did fairly well with Rx today and was able to progress with more sets and increased resistance on exs. His gait remains about the same with ataxic pattern and needs enough assistance for balance, but sit to stand was min assist.    Pt will benefit from skilled therapeutic intervention in order to improve on the following deficits Abnormal gait   Clinical Impairments Affecting Rehab Potential High fall potential.  HOH.   PT Next Visit Plan Bilateral lLE strengthening.  Gait and balance activities.   Consulted and Agree with Plan of Care Patient        Problem List Patient Active Problem List   Diagnosis Date Noted  . Muscular deconditioning 09/17/2015  . Other specified hypothyroidism 09/09/2015  . CAP (community acquired pneumonia) 09/09/2015  . Medullary carcinoma ([place]) 09/09/2015  . Adrenal  insufficiency ([place]) 09/09/2015  . Hypotension 09/09/2015  . Hyponatremia 09/09/2015  . Hypokalemia 09/09/2015    RAMSEUR,[name], PTA 11/25/2015, 4:43 PM  [facility] [place], [place], [address] Phone: [phone]   Fax:  [phone]  Name: BOBBYJOE LEETCH MRN: [id number] Date of Birth: Dec 08, 1995

## 2015-11-30 ENCOUNTER — Ambulatory Visit: Payer: Medicaid Other | Admitting: *Deleted

## 2015-11-30 DIAGNOSIS — R269 Unspecified abnormalities of gait and mobility: Secondary | ICD-10-CM

## 2015-11-30 DIAGNOSIS — R2681 Unsteadiness on feet: Secondary | ICD-10-CM | POA: Diagnosis not present

## 2015-11-30 DIAGNOSIS — R29898 Other symptoms and signs involving the musculoskeletal system: Secondary | ICD-10-CM

## 2015-11-30 NOTE — Therapy (Signed)
[facility] [place], [place], [address] Phone: [phone]   Fax:  [phone]  Physical Therapy Treatment  Patient Details  Name: James Hoffman MRN: [id number] Date of Birth: Feb 20, 1996 Referring Provider: Kenn File MD.  Encounter Date: 11/30/2015    Past Medical History  Diagnosis Date  . Thyroid disease   . 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 Adrenal insufficiency ([place])   . Hydrocephalus     Past Surgical History  Procedure Laterality Date  . Brain tumor removal  August 2012    There were no vitals filed for this visit.  Visit Diagnosis:  Unsteadiness  Abnormality of gait  Weakness of both lower extremities      Subjective Assessment - 11/30/15 1529    Subjective My legs are feeling stronger today than they have been. Still doing a little better   How long can you walk comfortably? Just around house.   Patient Stated Goals Would like to better.                         [place] Adult PT Treatment/Exercise - 11/30/15 0001    Ambulation/Gait   Ambulation/Gait Yes   Ambulation/Gait Assistance 3: Mod assist   Ambulation Distance (Feet) 110 Feet   Assistive device Small based quad cane   Gait Pattern Decreased stride length;Decreased dorsiflexion - right;Decreased dorsiflexion - left;Shuffle;Scissoring;Ataxic;Trunk flexed;Poor foot clearance - left;Poor foot clearance - right   Ambulation Surface Level   Gait Comments Ambulated with patient using SBQC and MIN Assist for balance.  Gait cycle is very ataxic.   Exercises   Exercises Knee/Hip   Knee/Hip Exercises: Aerobic   Nustep Level 5x 12 minutes.    Knee/Hip Exercises: Machines for Strengthening   Cybex Knee Extension Tried 10-20#s , but Pt had LT knee pain and was unable to perform more than 10   Knee/Hip Exercises: Standing   Knee Flexion AROM;3 sets;10 reps  TOE TAPS 6in block   Rocker Board 3 minutes  calf  stretching and balance CGA   Knee/Hip Exercises: Seated   Sit to Sand 10 reps;with UE support  SBACGA for balance after standing                     PT Long Term Goals - 11/30/15 1613    PT LONG TERM GOAL #3   Title Sit to stand with CGA.   Baseline Up to moderate assist required today.   Time 8   Period Weeks   Status Achieved               Plan - 11/30/15 1613    Clinical Impression Statement Pt did better with PT today and was able to meet LTG #3 today for standing with CGA. He was able to go from sit to stand with BiL. UE assist, but needed CGA for balance when standing. He was not able to meet LTG #4 for gait due to Ataxia and balance deficits    Pt will benefit from skilled therapeutic intervention in order to improve on the following deficits Abnormal gait   Clinical Impairments Affecting Rehab Potential High fall potential.  HOH.   PT Next Visit Plan Bilateral lLE strengthening.  Gait and balance activities.   Consulted and Agree with Plan of Care Patient        Problem List Patient Active Problem List   Diagnosis Date Noted  .  Muscular deconditioning 09/17/2015  . Other specified hypothyroidism 09/09/2015  . CAP (community acquired pneumonia) 09/09/2015  . Medullary carcinoma ([place]) 09/09/2015  . Adrenal insufficiency ([place]) 09/09/2015  . Hypotension 09/09/2015  . Hyponatremia 09/09/2015  . Hypokalemia 09/09/2015    Lauria Depoy,[name], PTA 11/30/2015, 4:25 PM  [facility] [place], [place], [address] Phone: [phone]   Fax:  [phone]  Name: James Hoffman MRN: [id number] Date of Birth: [birth year]

## 2015-11-30 NOTE — Therapy (Signed)
[facility] [place], [place], [address] Phone: [phone]   Fax:  [phone]  Physical Therapy Treatment  Patient Details  Name: James Hoffman MRN: [id number] Date of Birth: June 08, 1996 Referring Provider: Kenn File MD.  Encounter Date: 11/30/2015      PT End of Session - 11/30/15 1625    Visit Number 5   Number of Visits 16   Date for PT Re-Evaluation 12/09/15   PT Start Time [id number]   PT Stop Time 1605   PT Time Calculation (min) 50 min      Past Medical History  Diagnosis Date  . Thyroid disease   . 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 Adrenal insufficiency ([place])   . Hydrocephalus     Past Surgical History  Procedure Laterality Date  . Brain tumor removal  August 2012    There were no vitals filed for this visit.  Visit Diagnosis:  Unsteadiness  Abnormality of gait  Weakness of both lower extremities      Subjective Assessment - 11/30/15 1529    Subjective My legs are feeling stronger today than they have been. Still doing a little better   How long can you walk comfortably? Just around house.   Patient Stated Goals Would like to better.                         [place] Adult PT Treatment/Exercise - 11/30/15 0001    Ambulation/Gait   Ambulation/Gait Yes   Ambulation/Gait Assistance 3: Mod assist   Ambulation Distance (Feet) 110 Feet   Assistive device Small based quad cane   Gait Pattern Decreased stride length;Decreased dorsiflexion - right;Decreased dorsiflexion - left;Shuffle;Scissoring;Ataxic;Trunk flexed;Poor foot clearance - left;Poor foot clearance - right   Ambulation Surface Level   Gait Comments Ambulated with patient using SBQC and MIN Assist for balance.  Gait cycle is very ataxic.   Exercises   Exercises Knee/Hip   Knee/Hip Exercises: Aerobic   Nustep Level 5x 12 minutes.    Knee/Hip Exercises: Machines for Strengthening   Cybex Knee Extension  Tried 10-20#s , but Pt had LT knee pain and was unable to perform more than 10   Knee/Hip Exercises: Standing   Knee Flexion AROM;3 sets;10 reps  TOE TAPS 6in block   Rocker Board 3 minutes  calf stretching and balance CGA   Knee/Hip Exercises: Seated   Sit to Sand 10 reps;with UE support  SBACGA for balance after standing                     PT Long Term Goals - 11/30/15 1613    PT LONG TERM GOAL #3   Title Sit to stand with CGA.   Baseline Up to moderate assist required today.   Time 8   Period Weeks   Status Achieved               Plan - 11/30/15 1613    Clinical Impression Statement Pt did better with PT today and was able to meet LTG #3 today for standing with CGA. He was able to go from sit to stand with BiL. UE assist, but needed CGA for balance when standing. He was not able to meet LTG #4 for gait due to Ataxia and balance deficits    Pt will benefit from skilled therapeutic intervention in order to improve on the following deficits Abnormal gait  Clinical Impairments Affecting Rehab Potential High fall potential.  HOH.   PT Next Visit Plan Bilateral lLE strengthening.  Gait and balance activities.   Consulted and Agree with Plan of Care Patient        Problem List Patient Active Problem List   Diagnosis Date Noted  . Muscular deconditioning 09/17/2015  . Other specified hypothyroidism 09/09/2015  . CAP (community acquired pneumonia) 09/09/2015  . Medullary carcinoma ([place]) 09/09/2015  . Adrenal insufficiency ([place]) 09/09/2015  . Hypotension 09/09/2015  . Hyponatremia 09/09/2015  . Hypokalemia 09/09/2015    Ramiel Forti,[name] 11/30/2015, 4:27 PM  [facility] [address] [place], [place], [address] Phone: [phone]   Fax:  [phone]  Name: James Hoffman MRN: [id number] Date of Birth: 1996-04-25

## 2015-12-02 ENCOUNTER — Ambulatory Visit: Payer: Medicaid Other | Admitting: *Deleted

## 2015-12-02 DIAGNOSIS — R2681 Unsteadiness on feet: Secondary | ICD-10-CM

## 2015-12-02 DIAGNOSIS — R29898 Other symptoms and signs involving the musculoskeletal system: Secondary | ICD-10-CM

## 2015-12-02 DIAGNOSIS — R269 Unspecified abnormalities of gait and mobility: Secondary | ICD-10-CM

## 2015-12-02 NOTE — Therapy (Signed)
[facility] [address] [place], [place], [address] Phone: [phone]   Fax:  [phone]  Physical Therapy Treatment  Patient Details  Name: James Hoffman MRN: [id number] Date of Birth: 02/24/1996 Referring Provider: Samuel Bradshaw MD.  Encounter Date: 12/02/2015      PT End of Session - 12/02/15 1533    Visit Number 6   Number of Visits 16   Date for PT Re-Evaluation 12/09/15   PT Start Time 1515   PT Stop Time 1603   PT Time Calculation (min) 48 min      Past Medical History  Diagnosis Date  . Thyroid disease   . 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 Adrenal insufficiency ([facility])   . Hydrocephalus     Past Surgical History  Procedure Laterality Date  . Brain tumor removal  August 2012    There were no vitals filed for this visit.  Visit Diagnosis:  Unsteadiness  Abnormality of gait  Weakness of both lower extremities      Subjective Assessment - 12/02/15 1532    Subjective My legs are feeling stronger today than they have been. Still doing a little better, but LT knee has some pain at times   Limitations Walking   How long can you walk comfortably? Just around house.   Patient Stated Goals Would like to better.   Currently in Pain? No/denies                         [facility] Adult PT Treatment/Exercise - 12/02/15 0001    Ambulation/Gait   Ambulation/Gait Yes   Ambulation/Gait Assistance 3: Mod assist   Gait Pattern Decreased stride length;Decreased dorsiflexion - right;Decreased dorsiflexion - left;Shuffle;Scissoring;Ataxic;Trunk flexed;Poor foot clearance - left;Poor foot clearance - right   Ambulation Surface Level   Gait Comments Ambulated with patient using SBQC and MIN Assist for balance.  Gait cycle is very ataxic.   Exercises   Exercises Knee/Hip   Knee/Hip Exercises: Aerobic   Nustep Level 5x 12 minutes.    Knee/Hip Exercises: Standing   Knee Flexion AROM;3 sets;10  reps  TOE TAPS 6in block   Rocker Board 3 minutes  calf stretching and balance CGA   Knee/Hip Exercises: Seated   Long Arc Quad Strengthening;Left;1 set;10 reps  2#   Sit to Sand --   Knee/Hip Exercises: Supine   Short Arc Quad Sets Strengthening;Left;2 sets;10 reps  2#   Manual Therapy   Manual Therapy Passive ROM   Passive ROM PNF/AAROM to both LEs diagonal patterns and sagital plane                     PT Long Term Goals - 11/30/15 1613    PT LONG TERM GOAL #3   Title Sit to stand with CGA.   Baseline Up to moderate assist required today.   Time 8   Period Weeks   Status Achieved               Plan - 12/02/15 1533    Clinical Impression Statement Pt did fairly well with Rx today, but Lt knee pain limited him some. He was able to perform LAQ's/SAQs with a 2# wt. with minimal pain on LT knee. He did better with Manual PNF techniques with better control. LTG #3 was met this week, but others are ongoing   Pt will benefit from skilled therapeutic intervention in order to   improve on the following deficits Abnormal gait   Clinical Impairments Affecting Rehab Potential High fall potential.  HOH.   PT Next Visit Plan Bilateral lLE strengthening.  Gait and balance activities.   Consulted and Agree with Plan of Care Patient        Problem List Patient Active Problem List   Diagnosis Date Noted  . Muscular deconditioning 09/17/2015  . Other specified hypothyroidism 09/09/2015  . CAP (community acquired pneumonia) 09/09/2015  . Medullary carcinoma ([place]) 09/09/2015  . Adrenal insufficiency ([place]) 09/09/2015  . Hypotension 09/09/2015  . Hyponatremia 09/09/2015  . Hypokalemia 09/09/2015    RAMSEUR,[name], PTA 12/02/2015, 4:45 PM  [facility] [place], [place], [address] Phone: [phone]   Fax:  [phone]  Name: James Hoffman MRN: [id number] Date of Birth: 1996-06-23

## 2015-12-07 ENCOUNTER — Encounter: Payer: Medicaid Other | Admitting: Physical Therapy

## 2015-12-09 ENCOUNTER — Ambulatory Visit: Payer: Medicaid Other | Admitting: *Deleted

## 2015-12-09 DIAGNOSIS — R269 Unspecified abnormalities of gait and mobility: Secondary | ICD-10-CM

## 2015-12-09 DIAGNOSIS — R2681 Unsteadiness on feet: Secondary | ICD-10-CM

## 2015-12-09 DIAGNOSIS — R29898 Other symptoms and signs involving the musculoskeletal system: Secondary | ICD-10-CM

## 2015-12-09 NOTE — Therapy (Signed)
[facility] [place], [place], [address] Phone: [phone]   Fax:  [phone]  Physical Therapy Treatment  Patient Details  Name: James Hoffman MRN: [id number] Date of Birth: 06/20/1996 Referring Provider: Kenn File MD.  Encounter Date: 12/09/2015      PT End of Session - 12/09/15 1757    Visit Number 7   Number of Visits 16   Date for PT Re-Evaluation 12/09/15   PT Start Time 9833   PT Stop Time 8250   PT Time Calculation (min) 50 min      Past Medical History  Diagnosis Date  . Thyroid disease   . 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 Adrenal insufficiency ([place])   . Hydrocephalus     Past Surgical History  Procedure Laterality Date  . Brain tumor removal  August 2012    There were no vitals filed for this visit.  Visit Diagnosis:  Unsteadiness  Abnormality of gait  Weakness of both lower extremities      Subjective Assessment - 12/09/15 1805    Subjective My legs are feeling stronger today than they have been. Still doing a little better,  . Wearing a brace on LT knee. My Royann Shivers can tell I'm getting stronger   Limitations Walking   How long can you walk comfortably? Just around house.   Patient Stated Goals Would like to better.                         [place] Adult PT Treatment/Exercise - 12/09/15 0001    Ambulation/Gait   Ambulation/Gait Yes   Ambulation/Gait Assistance 3: Mod assist   Ambulation Distance (Feet) 40 Feet   Assistive device 1 person hand held assist   Gait Pattern Decreased stride length;Decreased dorsiflexion - right;Decreased dorsiflexion - left;Shuffle;Scissoring;Ataxic;Trunk flexed;Poor foot clearance - left;Poor foot clearance - right   Ambulation Surface Level   Gait Comments Ambulated with patient using HHA.  Gait cycle is very ataxic.   Exercises   Exercises Knee/Hip   Knee/Hip Exercises: Aerobic   Nustep Level 5x 12 minutes.    Knee/Hip Exercises: Standing   Knee Flexion AROM;3 sets;10 reps  TOE TAPS 8 in block with focus on keeping good    Rocker Board 3 minutes  calf stretching and balance CGA   Knee/Hip Exercises: Seated   Long Arc Quad Strengthening;Left;1 set;10 reps  5#  3x10   Sit to [employer] 10 reps;with UE support  SBACGA for balance after standing   Knee/Hip Exercises: Supine   Short Arc Quad Sets Strengthening;10 reps;Both;3 sets  5#   Bridges AROM;10 reps;1 set   Bridges with [employer] Strengthening;10 reps;3 sets   Bridges with Clamshell Strengthening;2 sets;10 reps   red tubing   Straight Leg Raises AROM;Both;2 sets;10 reps   Manual Therapy   Manual Therapy Passive ROM   Passive ROM PNF/AAROM to both LEs diagonal patterns and sagital plane                     PT Long Term Goals - 12/09/15 1801    PT LONG TERM GOAL #1   Title Ind with a HEP.   Time 8   Period Weeks   Status Achieved   PT LONG TERM GOAL #2   Title Increase bilateral LE strength to a solid 4/5.   Baseline Bilateral LE strength grades from 3- to 4-/5.   Time  8   Period Weeks   Status On-going   PT LONG TERM GOAL #3   Title Sit to stand with CGA.   Baseline Up to moderate assist required today.   Time 8   Period Weeks   Status Achieved   PT LONG TERM GOAL #4   Title Walk in clinic with CGA/SBA with a cane 500 feet.   Baseline Patient requires HHA to ambulate safely.   Time 8   Period Weeks   Status On-going               Plan - 12/09/15 1746    Clinical Impression Statement Pt did better today with Exs and was able to use more resistance with PREs due to less LT  knee pain. He was also able to use 8 inch step with toe  touches today with focus on control. No LTGs were met today, but Pt did progress.   Pt will benefit from skilled therapeutic intervention in order to improve on the following deficits Abnormal gait   Clinical Impairments Affecting Rehab Potential High fall potential.  HOH.    PT Next Visit Plan Bilateral lLE strengthening.  Gait and balance activities.   Consulted and Agree with Plan of Care Patient        Problem List Patient Active Problem List   Diagnosis Date Noted  . Muscular deconditioning 09/17/2015  . Other specified hypothyroidism 09/09/2015  . CAP (community acquired pneumonia) 09/09/2015  . Medullary carcinoma ([place]) 09/09/2015  . Adrenal insufficiency ([place]) 09/09/2015  . Hypotension 09/09/2015  . Hyponatremia 09/09/2015  . Hypokalemia 09/09/2015    Kandace Elrod,[name], PTA 12/09/2015, 6:09 PM  [facility] [place], [place], [address] Phone: [phone]   Fax:  [phone]  Name: James Hoffman MRN: [id number] Date of Birth: [birth year]

## 2015-12-14 ENCOUNTER — Ambulatory Visit: Payer: Medicaid Other | Admitting: *Deleted

## 2015-12-14 DIAGNOSIS — R2681 Unsteadiness on feet: Secondary | ICD-10-CM | POA: Diagnosis not present

## 2015-12-14 DIAGNOSIS — R269 Unspecified abnormalities of gait and mobility: Secondary | ICD-10-CM

## 2015-12-14 DIAGNOSIS — R29898 Other symptoms and signs involving the musculoskeletal system: Secondary | ICD-10-CM

## 2015-12-14 NOTE — Therapy (Signed)
[facility] [place], [place], [address] Phone: [phone]   Fax:  [phone]  Physical Therapy Treatment  Patient Details  Name: James Hoffman MRN: [id number] Date of Birth: [birth year] Referring Provider: Kenn File MD.  Encounter Date: 12/14/2015      PT End of Session - 12/14/15 1612    Visit Number 8   Number of Visits 16   Date for PT Re-Evaluation 01/10/16  N.O. signed 12-10-15   PT Start Time 0315   PT Stop Time 9458   PT Time Calculation (min) 49 min      Past Medical History  Diagnosis Date  . Thyroid disease   . 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 Adrenal insufficiency ([place])   . Hydrocephalus     Past Surgical History  Procedure Laterality Date  . Brain tumor removal  August 2012    There were no vitals filed for this visit.  Visit Diagnosis:  Unsteadiness  Abnormality of gait  Weakness of both lower extremities      Subjective Assessment - 12/14/15 1550    Subjective Not feeling as well today. Neck hurts at times and get dizzy   Limitations Walking   How long can you walk comfortably? Just around house.   Patient Stated Goals Would like to better.   Currently in Pain? No/denies                         [facility] Adult PT Treatment/Exercise - 12/14/15 0001    Ambulation/Gait   Ambulation/Gait Yes   Ambulation/Gait Assistance 3: Mod assist   Ambulation Distance (Feet) 40 Feet   Assistive device 1 person hand held assist   Gait Pattern Decreased stride length;Decreased dorsiflexion - right;Decreased dorsiflexion - left;Shuffle;Scissoring;Ataxic;Trunk flexed;Poor foot clearance - left;Poor foot clearance - right   Ambulation Surface Level   Gait Comments Ambulated with patient using HHA.  Gait cycle is very ataxic.   Exercises   Exercises Knee/Hip   Knee/Hip Exercises: Aerobic   Nustep Level 5x 12 minutes.    Knee/Hip Exercises: Standing   Knee  Flexion AROM;3 sets;10 reps  TOE TAPS 8 in block with focus on keeping good    Knee/Hip Exercises: Supine   Bridges AROM;10 reps;1 set   Bridges with Diona Foley Squeeze Strengthening;10 reps;3 sets   Bridges with Clamshell Strengthening;2 sets;10 reps   red tubing   Straight Leg Raises AROM;Both;2 sets;10 reps   Manual Therapy   Manual Therapy Passive ROM   Passive ROM PNF/AAROM to both LEs diagonal patterns and sagital plane with Pt supine                     PT Long Term Goals - 12/09/15 1801    PT LONG TERM GOAL #1   Title Ind with a HEP.   Time 8   Period Weeks   Status Achieved   PT LONG TERM GOAL #2   Title Increase bilateral LE strength to a solid 4/5.   Baseline Bilateral LE strength grades from 3- to 4-/5.   Time 8   Period Weeks   Status On-going   PT LONG TERM GOAL #3   Title Sit to stand with CGA.   Baseline Up to moderate assist required today.   Time 8   Period Weeks   Status Achieved   PT LONG TERM GOAL #4   Title Walk in clinic with  CGA/SBA with a cane 500 feet.   Baseline Patient requires HHA to ambulate safely.   Time 8   Period Weeks   Status On-going               Plan - 12/14/15 1617    Clinical Impression Statement Pt not feeling as well today. He has been geeting a little dizzy today and has had neck pain. He was able to complete most of his exs and Act's today, but not all. He had 2 dizzy spells that last about 30 seconds and then was fine. No new goals Met today   Pt will benefit from skilled therapeutic intervention in order to improve on the following deficits Abnormal gait   Clinical Impairments Affecting Rehab Potential High fall potential.  HOH.   PT Next Visit Plan Bilateral lLE strengthening.  Gait and balance activities.   Consulted and Agree with Plan of Care Patient        Problem List Patient Active Problem List   Diagnosis Date Noted  . Muscular deconditioning 09/17/2015  . Other specified hypothyroidism  09/09/2015  . CAP (community acquired pneumonia) 09/09/2015  . Medullary carcinoma ([place]) 09/09/2015  . Adrenal insufficiency ([place]) 09/09/2015  . Hypotension 09/09/2015  . Hyponatremia 09/09/2015  . Hypokalemia 09/09/2015    Tyffani Foglesong,[name], PTA 12/14/2015, 4:35 PM  [facility] [place], [place], [address] Phone: [phone]   Fax:  [phone]  Name: James Hoffman MRN: [id number] Date of Birth: 1996/08/01

## 2015-12-16 ENCOUNTER — Encounter: Payer: Medicaid Other | Admitting: *Deleted

## 2015-12-21 ENCOUNTER — Ambulatory Visit: Payer: Medicaid Other | Attending: Family Medicine | Admitting: *Deleted

## 2015-12-21 DIAGNOSIS — R269 Unspecified abnormalities of gait and mobility: Secondary | ICD-10-CM | POA: Insufficient documentation

## 2015-12-21 DIAGNOSIS — R29898 Other symptoms and signs involving the musculoskeletal system: Secondary | ICD-10-CM | POA: Insufficient documentation

## 2015-12-21 DIAGNOSIS — R2681 Unsteadiness on feet: Secondary | ICD-10-CM | POA: Diagnosis not present

## 2015-12-22 NOTE — Therapy (Signed)
[facility] [place], [place], [address] Phone: [phone]   Fax:  [phone]  Physical Therapy Treatment  Patient Details  Name: James Hoffman MRN: [id number] Date of Birth: December 17, 1995 Referring Provider: Kenn File MD.  Encounter Date: 12/21/2015      PT End of Session - 12/21/15 1539    Visit Number 9   Number of Visits 20   Date for PT Re-Evaluation 01/31/16   Authorization Type medicaid authorization to 01-31-16   PT Start Time [id number]   PT Stop Time 1606   PT Time Calculation (min) 51 min      Past Medical History  Diagnosis Date  . Thyroid disease   . 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 Adrenal insufficiency ([place])   . Hydrocephalus     Past Surgical History  Procedure Laterality Date  . Brain tumor removal  August 2012    There were no vitals filed for this visit.  Visit Diagnosis:  Unsteadiness  Abnormality of gait  Weakness of both lower extremities                       [facility] Adult PT Treatment/Exercise - 12/21/15 0001    Ambulation/Gait   Ambulation/Gait Yes   Ambulation/Gait Assistance 3: Mod assist   Ambulation Distance (Feet) 30 Feet   Assistive device 1 person hand held assist   Gait Pattern Decreased stride length;Decreased dorsiflexion - right;Decreased dorsiflexion - left;Shuffle;Scissoring;Ataxic;Trunk flexed;Poor foot clearance - left;Poor foot clearance - right   Ambulation Surface Level   Gait Comments Ambulated with patient using HHA.  Gait cycle is very ataxic.   Exercises   Exercises Knee/Hip   Knee/Hip Exercises: Aerobic   Nustep Level 5x 12 minutes.    Knee/Hip Exercises: Standing   Knee Flexion AROM;3 sets;10 reps  TOE TAPS 8 in block with focus on keeping good alignment   Lateral Step Up Both;2 sets;10 reps;Step Height: 8"  focus on control and alignment   Rocker Board 3 minutes   Knee/Hip Exercises: Supine   Bridges AROM;10  reps;Both;2 sets  4# ball at knees   Bridges with James Hoffman Squeeze Strengthening;10 reps;3 sets   Bridges with Clamshell Strengthening;2 sets;10 reps   red tubing   Straight Leg Raises AROM;Both;2 sets;10 reps   Knee Extension AROM;Left;3 sets;10 reps  pushing into ex bal/wall for LE control and quad control   Manual Therapy   Manual Therapy Passive ROM   Passive ROM PNF/AAROM to both LEs diagonal patterns and sagital plane with Pt supine                     PT Long Term Goals - 12/09/15 1801    PT LONG TERM GOAL #1   Title Ind with a HEP.   Time 8   Period Weeks   Status Achieved   PT LONG TERM GOAL #2   Title Increase bilateral LE strength to a solid 4/5.   Baseline Bilateral LE strength grades from 3- to 4-/5.   Time 8   Period Weeks   Status On-going   PT LONG TERM GOAL #3   Title Sit to stand with CGA.   Baseline Up to moderate assist required today.   Time 8   Period Weeks   Status Achieved   PT LONG TERM GOAL #4   Title Walk in clinic with CGA/SBA with a cane 500 feet.   Baseline  Patient requires HHA to ambulate safely.   Time 8   Period Weeks   Status On-going               Plan - 12/22/15 0747    Clinical Impression Statement Pt was feeling better today and had more energy during Rx.He was able to focus better during gait  and exs and had notable less ataxic movements on LT side.Min assist with sit to stand and was mainly for balance. Pt also did better with TKE control LT knee with less genu recurvatum moments   Pt will benefit from skilled therapeutic intervention in order to improve on the following deficits Abnormal gait   Clinical Impairments Affecting Rehab Potential High fall potential.  HOH.   PT Frequency 2x / week   PT Next Visit Plan Bilateral lLE strengthening.  Gait and balance activities.   Consulted and Agree with Plan of Care Patient        Problem List Patient Active Problem List   Diagnosis Date Noted  . Muscular  deconditioning 09/17/2015  . Other specified hypothyroidism 09/09/2015  . CAP (community acquired pneumonia) 09/09/2015  . Medullary carcinoma ([place]) 09/09/2015  . Adrenal insufficiency ([place]) 09/09/2015  . Hypotension 09/09/2015  . Hyponatremia 09/09/2015  . Hypokalemia 09/09/2015    James Hoffman,[name] 12/22/2015, 7:59 AM  [facility] [place], [place], [address] Phone: [phone]   Fax:  [phone]  Name: James Hoffman MRN: [id number] Date of Birth: August 09, 1996

## 2015-12-23 ENCOUNTER — Ambulatory Visit: Payer: Medicaid Other | Admitting: *Deleted

## 2015-12-23 DIAGNOSIS — R29898 Other symptoms and signs involving the musculoskeletal system: Secondary | ICD-10-CM

## 2015-12-23 DIAGNOSIS — R269 Unspecified abnormalities of gait and mobility: Secondary | ICD-10-CM

## 2015-12-23 DIAGNOSIS — R2681 Unsteadiness on feet: Secondary | ICD-10-CM

## 2015-12-23 NOTE — Therapy (Signed)
[facility] [place], [place], [address] Phone: [phone]   Fax:  [phone]  Physical Therapy Treatment  Patient Details  Name: James Hoffman MRN: [id number] Date of Birth: [birth year] Referring Provider: Kenn File MD.  Encounter Date: 12/23/2015      PT End of Session - 12/23/15 1604    Visit Number 10   Number of Visits 20   Date for PT Re-Evaluation 01/31/16   Authorization Type medicaid authorization to 01-31-16   PT Start Time 7654   PT Stop Time 1604   PT Time Calculation (min) 49 min      Past Medical History  Diagnosis Date  . Thyroid disease   . 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 Adrenal insufficiency ([place])   . Hydrocephalus     Past Surgical History  Procedure Laterality Date  . Brain tumor removal  August 2012    There were no vitals filed for this visit.  Visit Diagnosis:  Unsteadiness  Abnormality of gait  Weakness of both lower extremities      Subjective Assessment - 12/23/15 1550    Subjective Not feeling as well today. LT knee hurting some today. Forgot my brace   Limitations Walking   How long can you walk comfortably? Just around house.   Patient Stated Goals Would like to better.   Currently in Pain? Yes   Pain Score 2    Pain Location Knee   Pain Orientation Left                         [facility] Adult PT Treatment/Exercise - 12/23/15 0001    Ambulation/Gait   Ambulation/Gait Yes   Ambulation/Gait Assistance 3: Mod assist   Ambulation Distance (Feet) 30 Feet   Assistive device 1 person hand held assist   Gait Pattern Decreased stride length;Decreased dorsiflexion - right;Decreased dorsiflexion - left;Shuffle;Scissoring;Ataxic;Trunk flexed;Poor foot clearance - left;Poor foot clearance - right   Ambulation Surface Level   Gait Comments Ambulated with patient using HHA.  Gait cycle is very ataxic.   Exercises   Exercises Knee/Hip   Knee/Hip Exercises: Aerobic   Nustep Level 5x 15 minutes.    Knee/Hip Exercises: Standing   Knee Flexion AROM;3 sets;10 reps  TOE TAPS 8 in block with focus on keeping good alignment   Lateral Step Up Both;10 reps;Step Height: 8";3 sets  focus on control and alignment   Rocker Board 3 minutes  balance CGA   Knee/Hip Exercises: Supine   Bridges AROM;10 reps;Both;2 sets  4# ball at knees   Bridges with [employer] Strengthening;10 reps;3 sets  4#   Bridges with Clamshell Strengthening;10 reps;3 sets   red tubing   Straight Leg Raises AROM;Both;2 sets;10 reps   Knee Extension AROM;Left;10 reps;2 sets  pushing into ex bal/wall for LE control and quad control   Manual Therapy   Passive ROM PNF/AAROM to both LEs diagonal patterns and sagital plane with Pt supine                     PT Long Term Goals - 12/23/15 1825    PT LONG TERM GOAL #2   Title Increase bilateral LE strength to a solid 4/5.  MET for RT LE   Baseline Bilateral LE strength grades from 3- to 4-/5.   Time 8   Period Weeks   Status Partially Met   PT LONG TERM GOAL #  3   Title Sit to stand with CGA.   Baseline Up to moderate assist required today.   Time 8   Period Weeks   Status Achieved   PT LONG TERM GOAL #4   Title Walk in clinic with CGA/SBA with a cane 500 feet.   Baseline Patient requires HHA to ambulate safely.   Time 8   Period Weeks   Status On-going               Plan - 12/23/15 1615    Clinical Impression Statement Pt didn't do as well today due to not feeling to good and he forgot his knee brace for his LT knee. His strength in RT LE was 4/10 and was able to meet LTG partially. LT knee was unable to meet goal due to weakness and some pain.Others ongoing..    Pt will benefit from skilled therapeutic intervention in order to improve on the following deficits Abnormal gait   Clinical Impairments Affecting Rehab Potential High fall potential.  HOH.   PT Frequency 2x / week   PT  Next Visit Plan Bilateral lLE strengthening.  Gait and balance activities.   Consulted and Agree with Plan of Care Patient        Problem List Patient Active Problem List   Diagnosis Date Noted  . Muscular deconditioning 09/17/2015  . Other specified hypothyroidism 09/09/2015  . CAP (community acquired pneumonia) 09/09/2015  . Medullary carcinoma ([place]) 09/09/2015  . Adrenal insufficiency ([place]) 09/09/2015  . Hypotension 09/09/2015  . Hyponatremia 09/09/2015  . Hypokalemia 09/09/2015    Cebert Dettmann,[name], PTA 12/23/2015, 6:28 PM  [facility] [place], [place], [address] Phone: [phone]   Fax:  [phone]  Name: CEASAR DECANDIA MRN: [id number] Date of Birth: 01/18/1996

## 2015-12-28 ENCOUNTER — Ambulatory Visit: Payer: Medicaid Other | Admitting: *Deleted

## 2015-12-28 ENCOUNTER — Encounter: Payer: Self-pay | Admitting: *Deleted

## 2015-12-28 DIAGNOSIS — R269 Unspecified abnormalities of gait and mobility: Secondary | ICD-10-CM

## 2015-12-28 DIAGNOSIS — R2681 Unsteadiness on feet: Secondary | ICD-10-CM | POA: Diagnosis not present

## 2015-12-28 DIAGNOSIS — R29898 Other symptoms and signs involving the musculoskeletal system: Secondary | ICD-10-CM

## 2015-12-28 NOTE — Therapy (Signed)
[facility] [place], [place], [address] Phone: [phone]   Fax:  [phone]  Physical Therapy Treatment  Patient Details  Name: James Hoffman MRN: [id number] Date of Birth: 06/17/1996 Referring Provider: Kenn File MD.  Encounter Date: 12/28/2015      PT End of Session - 12/28/15 1620    Visit Number 11   Number of Visits 20   Date for PT Re-Evaluation 01/31/16   Authorization Type medicaid authorization to 01-31-16   PT Start Time 3545   PT Stop Time 1607   PT Time Calculation (min) 52 min      Past Medical History  Diagnosis Date  . Thyroid disease   . 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 Adrenal insufficiency ([place])   . Hydrocephalus     Past Surgical History  Procedure Laterality Date  . Brain tumor removal  August 2012    There were no vitals filed for this visit.  Visit Diagnosis:  Unsteadiness  Abnormality of gait  Weakness of both lower extremities                       [facility] Adult PT Treatment/Exercise - 12/28/15 0001    Ambulation/Gait   Ambulation/Gait Yes   Ambulation/Gait Assistance 4: Min assist   Ambulation Distance (Feet) 105 Feet   Assistive device Small based quad cane   Gait Pattern Decreased stride length;Decreased dorsiflexion - right;Decreased dorsiflexion - left;Shuffle;Scissoring;Ataxic;Trunk flexed;Poor foot clearance - left;Poor foot clearance - right   Ambulation Surface Level   Gait Comments Gait with gait belt and CGA using SBQC today.   Exercises   Exercises Knee/Hip   Knee/Hip Exercises: Aerobic   Nustep Level 5x 15 minutes.    Knee/Hip Exercises: Standing   Knee Flexion 3 sets;10 reps;Strengthening  TOE TAPS 8 in block with focus on keeping good alignment 2#    Lateral Step Up Both;10 reps;Step Height: 8";2 sets  TOE TAPS  focus on control and alignment with 2#   Rocker Board 3 minutes  balance CGA   Knee/Hip Exercises:  Seated   Long Arc Quad Strengthening;Left;1 set;10 reps  4# wt Knee pain after 10 and    Sit to Sand 10 reps;with UE support  SBA  CGA  as needed for balance after standing (50%)                     PT Long Term Goals - 12/23/15 1825    PT LONG TERM GOAL #2   Title Increase bilateral LE strength to a solid 4/5.  MET for RT LE   Baseline Bilateral LE strength grades from 3- to 4-/5.   Time 8   Period Weeks   Status Partially Met   PT LONG TERM GOAL #3   Title Sit to stand with CGA.   Baseline Up to moderate assist required today.   Time 8   Period Weeks   Status Achieved   PT LONG TERM GOAL #4   Title Walk in clinic with CGA/SBA with a cane 500 feet.   Baseline Patient requires HHA to ambulate safely.   Time 8   Period Weeks   Status On-going               Plan - 12/28/15 1511    Clinical Impression Statement Pt did a litlle better today with LT LE control with 8 in toe taps and foot  placement even with 2# wt added. Pt was able to ambulate with [facility] 2 and min A with gait belt in clinic today. Pt had a LOB x 4 due to foot placement and placing QC out to far. He was able to perform 10 sit to stands and maintain balance 50 % of the times. Gait goal NM yet and is ongoing    Pt will benefit from skilled therapeutic intervention in order to improve on the following deficits Abnormal gait   Clinical Impairments Affecting Rehab Potential High fall potential.  HOH.   PT Frequency 2x / week   PT Next Visit Plan Bilateral lLE strengthening.  Gait and balance activities.   Consulted and Agree with Plan of Care Patient        Problem List Patient Active Problem List   Diagnosis Date Noted  . Muscular deconditioning 09/17/2015  . Other specified hypothyroidism 09/09/2015  . CAP (community acquired pneumonia) 09/09/2015  . Medullary carcinoma ([place]) 09/09/2015  . Adrenal insufficiency ([place]) 09/09/2015  . Hypotension 09/09/2015  . Hyponatremia 09/09/2015  .  Hypokalemia 09/09/2015    RAMSEUR,[name], PTA 12/28/2015, 4:21 PM  [facility] [place], [place], [address] Phone: [phone]   Fax:  [phone]  Name: James Hoffman MRN: [id number] Date of Birth: 25-Feb-1996

## 2016-01-04 ENCOUNTER — Ambulatory Visit: Payer: Medicaid Other | Admitting: Physical Therapy

## 2016-01-04 ENCOUNTER — Encounter: Payer: Self-pay | Admitting: Physical Therapy

## 2016-01-04 DIAGNOSIS — R2681 Unsteadiness on feet: Secondary | ICD-10-CM | POA: Diagnosis not present

## 2016-01-04 DIAGNOSIS — R269 Unspecified abnormalities of gait and mobility: Secondary | ICD-10-CM

## 2016-01-04 DIAGNOSIS — R29898 Other symptoms and signs involving the musculoskeletal system: Secondary | ICD-10-CM

## 2016-01-04 NOTE — Therapy (Signed)
[facility] [place], [place], [address] Phone: [phone]   Fax:  [phone]  Physical Therapy Treatment  Patient Details  Name: James Hoffman MRN: [id number] Date of Birth: 09-07-1996 Referring Provider: Kenn File MD.  Encounter Date: 01/04/2016      PT End of Session - 01/04/16 1448    Visit Number 12   Number of Visits 20   Date for PT Re-Evaluation 01/31/16   Authorization Type medicaid authorization to 01-31-16   PT Start Time 1436   PT Stop Time 1520   PT Time Calculation (min) 44 min   Activity Tolerance Patient limited by fatigue;Patient limited by pain   Behavior During Therapy [facility] for tasks assessed/performed      Past Medical History  Diagnosis Date  . Thyroid disease   . 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 Adrenal insufficiency ([place])   . Hydrocephalus     Past Surgical History  Procedure Laterality Date  . Brain tumor removal  August 2012    There were no vitals filed for this visit.  Visit Diagnosis:  Unsteadiness  Abnormality of gait  Weakness of both lower extremities      Subjective Assessment - 01/04/16 1442    Subjective Wore brace today to treatment and states that he hit his head behind his L ear on his hospital bed at home today.   Limitations Walking   How long can you walk comfortably? Just around house.   Patient Stated Goals Would like to better.            [facility] PT Assessment - 01/04/16 0001    Assessment   Medical Diagnosis Muscular deconditioning.                     [facility] Adult PT Treatment/Exercise - 01/04/16 0001    Knee/Hip Exercises: Aerobic   Nustep Level 5x 15 minutes.    Knee/Hip Exercises: Standing   Lateral Step Up Left;1 set;10 reps;Hand Hold: 2;Step Height: 8"  2# ankleweight   Rocker Board 3 minutes  for balance with 1 UE support   Knee/Hip Exercises: Seated   Long Arc Quad Strengthening;Both;2 sets;10  reps;Weights   Long Arc Quad Weight 4 lbs.   Sit to Sand 10 reps;with UE support  SB/CGA for balance upon standing   Knee/Hip Exercises: Supine   Bridges with [employer] Strengthening;2 sets;10 reps  4# ball   Bridges with Clamshell Strengthening;10 reps;3 sets  Red theraband   Knee/Hip Exercises: Sidelying   Clams B clamshell red theraband 2x10 reps                     PT Long Term Goals - 01/04/16 1449    PT LONG TERM GOAL #1   Title Ind with a HEP.   Baseline No knowledge of appropriate ther ex.   Time 8   Period Weeks   Status Achieved   PT LONG TERM GOAL #2   Title Increase bilateral LE strength to a solid 4/5.  MET for RT LE   Baseline Bilateral LE strength grades from 3- to 4-/5.   Time 8   Period Weeks   Status Partially Met   PT LONG TERM GOAL #3   Title Sit to stand with CGA.   Baseline Up to moderate assist required today.   Time 8   Period Weeks   Status Achieved   PT LONG TERM  GOAL #4   Title Walk in clinic with CGA/SBA with a cane 500 feet.   Baseline Patient requires HHA to ambulate safely.   Time 8   Period Weeks   Status On-going               Plan - 01/04/16 1526    Clinical Impression Statement Patient tolerated today's treatment fairly well although standing exercises were limited secondary to patient's back pain. Better control observed with LLE toe taps to 8" step with 2# although L hip abductors weakness was noted with exercise. Patient experienced fatigue with both bridge activities today. Experienced "a little" back and L knee pain during today's treatment. Sit to stands completed today utilized CGA/SBA and gait between exercises required CGA to min A =1 due to instability and ataxic gait.   Pt will benefit from skilled therapeutic intervention in order to improve on the following deficits Abnormal gait   Clinical Impairments Affecting Rehab Potential High fall potential.  HOH.   PT Frequency 2x / week   PT Next Visit Plan  Bilateral lLE strengthening.  Gait and balance activities.   Consulted and Agree with Plan of Care Patient        Problem List Patient Active Problem List   Diagnosis Date Noted  . Muscular deconditioning 09/17/2015  . Other specified hypothyroidism 09/09/2015  . CAP (community acquired pneumonia) 09/09/2015  . Medullary carcinoma ([place]) 09/09/2015  . Adrenal insufficiency ([place]) 09/09/2015  . Hypotension 09/09/2015  . Hyponatremia 09/09/2015  . Hypokalemia 09/09/2015    James Hoffman, PTA 01/04/2016, 3:37 PM  [facility] [address] [place], [place], [address] Phone: [phone]   Fax:  [phone]  Name: James Hoffman MRN: [id number] Date of Birth: [birth year]

## 2016-01-06 ENCOUNTER — Encounter: Payer: Self-pay | Admitting: *Deleted

## 2016-01-06 ENCOUNTER — Ambulatory Visit: Payer: Medicaid Other | Admitting: *Deleted

## 2016-01-06 DIAGNOSIS — R2681 Unsteadiness on feet: Secondary | ICD-10-CM | POA: Diagnosis not present

## 2016-01-06 DIAGNOSIS — R29898 Other symptoms and signs involving the musculoskeletal system: Secondary | ICD-10-CM

## 2016-01-06 DIAGNOSIS — R269 Unspecified abnormalities of gait and mobility: Secondary | ICD-10-CM

## 2016-01-06 NOTE — Therapy (Signed)
[facility] [place], [place], [address] Phone: [phone]   Fax:  [phone]  Physical Therapy Treatment  Patient Details  Name: James Hoffman MRN: [id number] Date of Birth: Aug 07, 1996 Referring Provider: Kenn File MD.  Encounter Date: 01/06/2016      PT End of Session - 01/06/16 1541    Visit Number 13   Number of Visits 20   Date for PT Re-Evaluation 01/31/16   Authorization Type medicaid authorization to 01-31-16   PT Start Time 3016   PT Stop Time 1602   PT Time Calculation (min) 47 min   Activity Tolerance Patient limited by fatigue;Patient limited by pain   Behavior During Therapy [facility] for tasks assessed/performed      Past Medical History  Diagnosis Date  . Thyroid disease   . 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 Adrenal insufficiency ([place])   . Hydrocephalus     Past Surgical History  Procedure Laterality Date  . Brain tumor removal  August 2012    There were no vitals filed for this visit.  Visit Diagnosis:  Unsteadiness  Abnormality of gait  Weakness of both lower extremities      Subjective Assessment - 01/06/16 1529    Subjective Wore brace today for LT knee. It's not hurting today.   Limitations Walking   How long can you walk comfortably? Just around house.   Patient Stated Goals Would like to better.   Currently in Pain? No/denies                         [facility] Adult PT Treatment/Exercise - 01/06/16 0001    Ambulation/Gait   Ambulation/Gait Yes   Ambulation/Gait Assistance 4: Min assist   Ambulation Distance (Feet) 100 Feet   Assistive device Small based quad cane   Gait Pattern Decreased stride length;Decreased dorsiflexion - right;Decreased dorsiflexion - left;Shuffle;Scissoring;Ataxic;Trunk flexed;Poor foot clearance - left;Poor foot clearance - right   Ambulation Surface Level   Gait Comments Gait with gait belt and CGA using SBQC today.   Exercises   Exercises Knee/Hip   Knee/Hip Exercises: Aerobic   Nustep Level 5x 10 minutes.    Knee/Hip Exercises: Standing   Knee Flexion 10 reps;Strengthening;Both;2 sets  TOE TAPS 8 in block with focus on keeping good alignment 2#    Lateral Step Up 1 set;10 reps;Hand Hold: 2;Step Height: 8";Both  2# ankleweight   Rocker Board 3 minutes  for balance with 1 UE support   Knee/Hip Exercises: Seated   Long Arc Quad Strengthening;Both;2 sets;10 reps;Weights   Long Arc Quad Weight 4 lbs.   Sit to Sand 10 reps;with UE support  SB/CGA for balance upon standing                     PT Long Term Goals - 01/04/16 1449    PT LONG TERM GOAL #1   Title Ind with a HEP.   Baseline No knowledge of appropriate ther ex.   Time 8   Period Weeks   Status Achieved   PT LONG TERM GOAL #2   Title Increase bilateral LE strength to a solid 4/5.  MET for RT LE   Baseline Bilateral LE strength grades from 3- to 4-/5.   Time 8   Period Weeks   Status Partially Met   PT LONG TERM GOAL #3   Title Sit to stand with CGA.  Baseline Up to moderate assist required today.   Time 8   Period Weeks   Status Achieved   PT LONG TERM GOAL #4   Title Walk in clinic with CGA/SBA with a cane 500 feet.   Baseline Patient requires HHA to ambulate safely.   Time 8   Period Weeks   Status On-going               Plan - 01/06/16 1614    Clinical Impression Statement Pt wore brace on LT knee today and was able to complete Exs and Act's without c/o LT knee pain. We ambulated about a 100 ft today with SBQC and CGA/ and min Assist at times for balance.correction. He still needs V/C's for technique with QC. He was able to go from sit to stand  x 10 with use of UE's and  was able to maintain balance 6/10 times. No new goals met today   Pt will benefit from skilled therapeutic intervention in order to improve on the following deficits Abnormal gait   Clinical Impairments Affecting Rehab Potential High  fall potential.  HOH.   PT Frequency 2x / week   PT Next Visit Plan Bilateral lLE strengthening.  Gait and balance activities.   Consulted and Agree with Plan of Care Patient        Problem List Patient Active Problem List   Diagnosis Date Noted  . Muscular deconditioning 09/17/2015  . Other specified hypothyroidism 09/09/2015  . CAP (community acquired pneumonia) 09/09/2015  . Medullary carcinoma ([place]) 09/09/2015  . Adrenal insufficiency ([place]) 09/09/2015  . Hypotension 09/09/2015  . Hyponatremia 09/09/2015  . Hypokalemia 09/09/2015    RAMSEUR,[name], PTA 01/06/2016, 9:11 PM  [facility] [place], [place], [address] Phone: [phone]   Fax:  [phone]  Name: James Hoffman MRN: [id number] Date of Birth: 1996/07/23

## 2016-01-07 ENCOUNTER — Other Ambulatory Visit: Payer: Self-pay

## 2016-01-07 DIAGNOSIS — E038 Other specified hypothyroidism: Secondary | ICD-10-CM

## 2016-01-07 MED ORDER — LEVOTHYROXINE SODIUM 88 MCG PO TABS: 88.0000 ug | ORAL_TABLET | Freq: Every day | ORAL | Status: DC

## 2016-01-11 ENCOUNTER — Ambulatory Visit: Payer: Medicaid Other | Admitting: *Deleted

## 2016-01-11 DIAGNOSIS — R29898 Other symptoms and signs involving the musculoskeletal system: Secondary | ICD-10-CM

## 2016-01-11 DIAGNOSIS — R2681 Unsteadiness on feet: Secondary | ICD-10-CM

## 2016-01-11 DIAGNOSIS — R269 Unspecified abnormalities of gait and mobility: Secondary | ICD-10-CM

## 2016-01-11 NOTE — Therapy (Signed)
[facility] [place], [place], [address] Phone: [phone]   Fax:  [phone]  Physical Therapy Treatment  Patient Details  Name: James Hoffman MRN: [id number] Date of Birth: 1996-06-22 Referring Provider: Kenn File MD.  Encounter Date: 01/11/2016      PT End of Session - 01/11/16 1559    Visit Number 14   Number of Visits 20   Date for PT Re-Evaluation 01/31/16   Authorization Type medicaid authorization to 01-31-16   PT Start Time 8889   PT Stop Time 1605   PT Time Calculation (min) 50 min      Past Medical History  Diagnosis Date  . Thyroid disease   . 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 Adrenal insufficiency ([place])   . Hydrocephalus     Past Surgical History  Procedure Laterality Date  . Brain tumor removal  August 2012    There were no vitals filed for this visit.  Visit Diagnosis:  Unsteadiness  Abnormality of gait  Weakness of both lower extremities      Subjective Assessment - 01/11/16 1535    Subjective Wore brace today for LT knee. It's not hurting today.   Limitations Walking   How long can you walk comfortably? Just around house.   Patient Stated Goals Would like to better.   Currently in Pain? No/denies                         [facility] Adult PT Treatment/Exercise - 01/11/16 0001    Ambulation/Gait   Ambulation/Gait Yes   Ambulation/Gait Assistance 4: Min assist   Ambulation/Gait Assistance Details Pt did better with RW than SBQC, but needs V/Cs to slow down   Ambulation Distance (Feet) 200 Feet   Assistive device Rolling walker   Gait Pattern Decreased stride length;Decreased dorsiflexion - right;Decreased dorsiflexion - left;Shuffle;Scissoring;Ataxic;Trunk flexed;Poor foot clearance - left;Poor foot clearance - right   Ambulation Surface Level   Gait Comments Gait with and SBA/CGA using RW   Knee/Hip Exercises: Aerobic   Nustep Level 5x 10  minutes.    Knee/Hip Exercises: Standing   Knee Flexion 10 reps;Strengthening;Both;2 sets  TOE TAPS 8 in block with focus on keeping good alignment 2#    Lateral Step Up 1 set;10 reps;Hand Hold: 2;Step Height: 8";Both  2# ankleweight   Rocker Board 3 minutes  for balance with 1 UE support   Knee/Hip Exercises: Seated   Long Arc Quad Strengthening;Both;2 sets;10 reps;Weights  4#LT, 5# RT   Long Arc Quad Weight 4 lbs.   Sit to Sand 10 reps;with UE support  SB/CGA for balance upon standing                                                                                                  LEs only today on Nustep                 PT Long Term Goals - 01/04/16 1449    PT LONG TERM GOAL #1   Title  Ind with a HEP.   Baseline No knowledge of appropriate ther ex.   Time 8   Period Weeks   Status Achieved   PT LONG TERM GOAL #2   Title Increase bilateral LE strength to a solid 4/5.  MET for RT LE   Baseline Bilateral LE strength grades from 3- to 4-/5.   Time 8   Period Weeks   Status Partially Met   PT LONG TERM GOAL #3   Title Sit to stand with CGA.   Baseline Up to moderate assist required today.   Time 8   Period Weeks   Status Achieved   PT LONG TERM GOAL #4   Title Walk in clinic with CGA/SBA with a cane 500 feet.   Baseline Patient requires HHA to ambulate safely.   Time 8   Period Weeks   Status On-going               Plan - 01/11/16 1600    Clinical Impression Statement Pt wore brace for LT knee today, but still had some pain during RX. He was able to ambulate x 200 ft.with RW and SBA/CGA. He needs V/Cs for erect posture and to look up while ambulating. with sit to stand today- he was Independent with sit to stand using UE assist. He was able to maintain balance better today and only needed min assist to regain  balance   Clinical Impairments Affecting Rehab Potential High fall potential.  HOH.   PT Frequency 2x / week   PT Next Visit Plan Bilateral  lLE strengthening.  Gait and balance activities.  TRy 3# wts with step up and 5# on LAQs   Consulted and Agree with Plan of Care Patient        Problem List Patient Active Problem List   Diagnosis Date Noted  . Muscular deconditioning 09/17/2015  . Other specified hypothyroidism 09/09/2015  . CAP (community acquired pneumonia) 09/09/2015  . Medullary carcinoma ([place]) 09/09/2015  . Adrenal insufficiency ([place]) 09/09/2015  . Hypotension 09/09/2015  . Hyponatremia 09/09/2015  . Hypokalemia 09/09/2015    James Hoffman,[name],PTA 01/11/2016, 4:19 PM  [facility] [address] [place], [place], [address] Phone: [phone]   Fax:  [phone]  Name: James Hoffman MRN: [id number] Date of Birth: 02-06-1996

## 2016-01-13 ENCOUNTER — Ambulatory Visit: Payer: Medicaid Other | Attending: Family Medicine | Admitting: *Deleted

## 2016-01-13 DIAGNOSIS — R29898 Other symptoms and signs involving the musculoskeletal system: Secondary | ICD-10-CM | POA: Insufficient documentation

## 2016-01-13 DIAGNOSIS — R269 Unspecified abnormalities of gait and mobility: Secondary | ICD-10-CM

## 2016-01-13 DIAGNOSIS — R2681 Unsteadiness on feet: Secondary | ICD-10-CM | POA: Insufficient documentation

## 2016-01-13 NOTE — Therapy (Signed)
[facility] [place], [place], [address] Phone: [phone]   Fax:  [phone]  Physical Therapy Treatment  Patient Details  Name: James Hoffman MRN: [id number] Date of Birth: 10-07-1996 Referring Provider: Kenn File MD.  Encounter Date: 01/13/2016      PT End of Session - 01/13/16 1554    Visit Number 15   Number of Visits 20   Date for PT Re-Evaluation 01/31/16   Authorization Type medicaid authorization to 01-31-16   PT Start Time 8527   PT Stop Time 1606   PT Time Calculation (min) 51 min      Past Medical History  Diagnosis Date  . Thyroid disease   . 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 Adrenal insufficiency ([place])   . Hydrocephalus     Past Surgical History  Procedure Laterality Date  . Brain tumor removal  August 2012    There were no vitals filed for this visit.  Visit Diagnosis:  Unsteadiness  Abnormality of gait  Weakness of both lower extremities                       [facility] Adult PT Treatment/Exercise - 01/13/16 0001    Ambulation/Gait   Ambulation/Gait Yes   Ambulation/Gait Assistance 4: Min assist   Ambulation Distance (Feet) 155 Feet   Assistive device Rolling walker   Gait Pattern Decreased stride length;Decreased dorsiflexion - right;Decreased dorsiflexion - left;Shuffle;Scissoring;Ataxic;Trunk flexed;Poor foot clearance - left;Poor foot clearance - right   Ambulation Surface Level   Gait Comments Gait with and SBA/CGA using RW   Exercises   Exercises Knee/Hip   Knee/Hip Exercises: Aerobic   Nustep Level 5x 10 minutes.    Knee/Hip Exercises: Standing   Knee Flexion 10 reps;Strengthening;Both;2 sets  TOE TAPS 8 in block with focus on keeping good alignment 3#    Rocker Board 3 minutes  for balance with 1 UE support   Knee/Hip Exercises: Seated   Long Arc Quad Strengthening;Both;2 sets;10 reps;Weights  5#LT,  7.5# RT   Long Arc Quad Weight 5  lbs.   Sit to Sand 10 reps;with UE support  SB/CGA for balance upon standing   Knee/Hip Exercises: Supine   Bridges with [employer] Strengthening;2 sets;10 reps  4# ball   Straight Leg Raises 2 sets;10 reps;Both   Manual Therapy   Passive ROM PNF/AAROM to both LEs diagonal patterns and sagital plane with Pt supine                     PT Long Term Goals - 01/04/16 1449    PT LONG TERM GOAL #1   Title Ind with a HEP.   Baseline No knowledge of appropriate ther ex.   Time 8   Period Weeks   Status Achieved   PT LONG TERM GOAL #2   Title Increase bilateral LE strength to a solid 4/5.  MET for RT LE   Baseline Bilateral LE strength grades from 3- to 4-/5.   Time 8   Period Weeks   Status Partially Met   PT LONG TERM GOAL #3   Title Sit to stand with CGA.   Baseline Up to moderate assist required today.   Time 8   Period Weeks   Status Achieved   PT LONG TERM GOAL #4   Title Walk in clinic with CGA/SBA with a cane 500 feet.   Baseline Patient requires HHA  to ambulate safely.   Time 8   Period Weeks   Status On-going               Plan - 01/13/16 1555    Clinical Impression Statement Pt did fairly well today and was able to complete [place] without any knee pain. He was able to ambulate with FWW again with CGA with no LOB episodes. He still needs V/Cs for posture during ambulation especially to keep his head up and to look forward. He does much better with a walker than a QC and not sure if he will be able to progress to [facility] due to Ataxic gait pattern and balance. Goals are ongoing.   Pt will benefit from skilled therapeutic intervention in order to improve on the following deficits Abnormal gait   Clinical Impairments Affecting Rehab Potential High fall potential.  HOH.   PT Frequency 2x / week   PT Next Visit Plan Bilateral lLE strengthening.  Gait and balance activities.  TRy 3# wts with step up and 5# on LAQs   Consulted and Agree with Plan of Care  Patient        Problem List Patient Active Problem List   Diagnosis Date Noted  . Muscular deconditioning 09/17/2015  . Other specified hypothyroidism 09/09/2015  . CAP (community acquired pneumonia) 09/09/2015  . Medullary carcinoma ([place]) 09/09/2015  . Adrenal insufficiency ([place]) 09/09/2015  . Hypotension 09/09/2015  . Hyponatremia 09/09/2015  . Hypokalemia 09/09/2015    RAMSEUR,[name], PTA 01/13/2016, 6:35 PM  [facility] [place], [place], [address] Phone: [phone]   Fax:  [phone]  Name: James Hoffman MRN: [id number] Date of Birth: 1996/02/03

## 2016-01-18 ENCOUNTER — Ambulatory Visit: Payer: Medicaid Other | Admitting: *Deleted

## 2016-01-18 DIAGNOSIS — R269 Unspecified abnormalities of gait and mobility: Secondary | ICD-10-CM

## 2016-01-18 DIAGNOSIS — R2681 Unsteadiness on feet: Secondary | ICD-10-CM | POA: Diagnosis not present

## 2016-01-18 DIAGNOSIS — R29898 Other symptoms and signs involving the musculoskeletal system: Secondary | ICD-10-CM

## 2016-01-18 NOTE — Therapy (Signed)
[facility] [place], [place], [address] Phone: [phone]   Fax:  [phone]  Physical Therapy Treatment  Patient Details  Name: James Hoffman MRN: [id number] Date of Birth: December 23, 1995 Referring Provider: Kenn File MD.  Encounter Date: 01/18/2016      PT End of Session - 01/18/16 1534    Visit Number 16   Number of Visits 20   Date for PT Re-Evaluation 01/31/16   Authorization Type medicaid authorization to 01-31-16   PT Start Time 5364   PT Stop Time 1605   PT Time Calculation (min) 50 min      Past Medical History  Diagnosis Date  . Thyroid disease   . 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 Adrenal insufficiency ([place])   . Hydrocephalus     Past Surgical History  Procedure Laterality Date  . Brain tumor removal  August 2012    There were no vitals filed for this visit.  Visit Diagnosis:  Unsteadiness  Abnormality of gait  Weakness of both lower extremities      Subjective Assessment - 01/18/16 1532    Subjective Wore brace today for LT knee. It's not hurting today. walking with standard walker more  at [facility]   Limitations Walking   How long can you walk comfortably? Just around house.   Patient Stated Goals Would like to better.   Currently in Pain? No/denies                         [facility] Adult PT Treatment/Exercise - 01/18/16 0001    Ambulation/Gait   Ambulation/Gait Yes   Ambulation Distance (Feet) 155 Feet   Assistive device Standard walker   Gait Pattern Decreased stride length;Decreased dorsiflexion - right;Decreased dorsiflexion - left;Shuffle;Scissoring;Ataxic;Trunk flexed;Poor foot clearance - left;Poor foot clearance - right   Ambulation Surface Level   Gait Comments SBA only today, no LOB   Exercises   Exercises Knee/Hip   Knee/Hip Exercises: Aerobic   Nustep Level 5x 13 minutes.    Knee/Hip Exercises: Standing   Knee Flexion 10  reps;Strengthening;Both;3 sets  TOE TAPS 8 in block with focus on keeping good alignment 3#    Knee/Hip Exercises: Seated   Long Arc Quad Strengthening;Both;10 reps;Weights;3 sets  5#LT,  7.5# RT   Long Arc Quad Weight 5 lbs.   Sit to Sand 10 reps;with UE support  SB/CGA for balance upon standing                     PT Long Term Goals - 01/04/16 1449    PT LONG TERM GOAL #1   Title Ind with a HEP.   Baseline No knowledge of appropriate ther ex.   Time 8   Period Weeks   Status Achieved   PT LONG TERM GOAL #2   Title Increase bilateral LE strength to a solid 4/5.  MET for RT LE   Baseline Bilateral LE strength grades from 3- to 4-/5.   Time 8   Period Weeks   Status Partially Met   PT LONG TERM GOAL #3   Title Sit to stand with CGA.   Baseline Up to moderate assist required today.   Time 8   Period Weeks   Status Achieved   PT LONG TERM GOAL #4   Title Walk in clinic with CGA/SBA with a cane 500 feet.   Baseline Patient requires HHA to ambulate  safely.   Time 8   Period Weeks   Status On-going               Plan - 01/18/16 1559    Clinical Impression Statement  Pt did better today with ambulation and was able to use standard walker and only needed SBA and had no LOB episodes. His gate speed is slower, but much safer with standard walker. He did better with sit to stand today as well and only needed CGA  for balance 4 out of 10 times   Clinical Impairments Affecting Rehab Potential High fall potential.  HOH.   PT Frequency 2x / week   PT Next Visit Plan Bilateral lLE strengthening.  Gait and balance activities.  TRy 3# wts with step up and 5# on LAQs   Consulted and Agree with Plan of Care Patient        Problem List Patient Active Problem List   Diagnosis Date Noted  . Muscular deconditioning 09/17/2015  . Other specified hypothyroidism 09/09/2015  . CAP (community acquired pneumonia) 09/09/2015  . Medullary carcinoma ([place]) 09/09/2015  .  Adrenal insufficiency ([place]) 09/09/2015  . Hypotension 09/09/2015  . Hyponatremia 09/09/2015  . Hypokalemia 09/09/2015    Azariah Latendresse,[name], PTA 01/18/2016, 5:32 PM  [facility] [place], [place], [address] Phone: [phone]   Fax:  [phone]  Name: James Hoffman MRN: [id number] Date of Birth: December 12, 1995

## 2016-01-20 ENCOUNTER — Ambulatory Visit: Payer: Medicaid Other | Admitting: *Deleted

## 2016-01-20 DIAGNOSIS — R2681 Unsteadiness on feet: Secondary | ICD-10-CM | POA: Diagnosis not present

## 2016-01-20 DIAGNOSIS — R29898 Other symptoms and signs involving the musculoskeletal system: Secondary | ICD-10-CM

## 2016-01-20 DIAGNOSIS — R269 Unspecified abnormalities of gait and mobility: Secondary | ICD-10-CM

## 2016-01-20 NOTE — Therapy (Signed)
[facility] [place], [place], [address] Phone: [phone]   Fax:  [phone]  Physical Therapy Treatment  Patient Details  Name: James Hoffman MRN: [id number] Date of Birth: Jan 19, 1996 Referring Provider: Kenn File MD.  Encounter Date: 01/20/2016      PT End of Session - 01/20/16 1539    Visit Number 17   Number of Visits 20   Date for PT Re-Evaluation 01/31/16   Authorization Type medicaid authorization to 01-31-16   PT Start Time 1524   PT Stop Time 1609   PT Time Calculation (min) 45 min      Past Medical History  Diagnosis Date  . Thyroid disease   . 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 Adrenal insufficiency ([place])   . Hydrocephalus     Past Surgical History  Procedure Laterality Date  . Brain tumor removal  August 2012    There were no vitals filed for this visit.  Visit Diagnosis:  Unsteadiness  Abnormality of gait  Weakness of both lower extremities      Subjective Assessment - 01/20/16 1526    Subjective Running late today.  Tired today. Forgot my knee brace today   Limitations Walking   How long can you walk comfortably? Just around house.   Patient Stated Goals Would like to better.   Currently in Pain? No/denies                         [facility] Adult PT Treatment/Exercise - 01/20/16 0001    Ambulation/Gait   Ambulation/Gait Yes   Ambulation/Gait Assistance 4: Min assist   Ambulation Distance (Feet) 160 Feet   Assistive device Standard walker   Gait Pattern Decreased stride length;Decreased dorsiflexion - right;Decreased dorsiflexion - left;Shuffle;Scissoring;Ataxic;Trunk flexed;Poor foot clearance - left;Poor foot clearance - right   Ambulation Surface Level   Gait Comments CGA today. Pt was tired and his balance was a littlle worse today   Exercises   Exercises Knee/Hip   Knee/Hip Exercises: Aerobic   Nustep Level 5x 12 minutes.    Knee/Hip  Exercises: Standing   Knee Flexion 10 reps;Strengthening;Both;3 sets  TOE TAPS 8 in block with focus on keeping good alignment 3#    Knee/Hip Exercises: Seated   Long Arc Quad Strengthening;Both;10 reps;Weights;3 sets  5#LT,  7.5# RT   Long Arc Quad Weight 5 lbs.   Sit to Sand 10 reps;with UE support  SB/CGA for balance upon standing                     PT Long Term Goals - 01/04/16 1449    PT LONG TERM GOAL #1   Title Ind with a HEP.   Baseline No knowledge of appropriate ther ex.   Time 8   Period Weeks   Status Achieved   PT LONG TERM GOAL #2   Title Increase bilateral LE strength to a solid 4/5.  MET for RT LE   Baseline Bilateral LE strength grades from 3- to 4-/5.   Time 8   Period Weeks   Status Partially Met   PT LONG TERM GOAL #3   Title Sit to stand with CGA.   Baseline Up to moderate assist required today.   Time 8   Period Weeks   Status Achieved   PT LONG TERM GOAL #4   Title Walk in clinic with CGA/SBA with a cane 500 feet.  Baseline Patient requires HHA to ambulate safely.   Time 8   Period Weeks   Status On-going               Plan - 01/20/16 1830    Clinical Impression Statement Pt did fairly well today with Rx , but was tired upon arrival. He was able to complete all exs today,  but slower today due to fatigue. He was able to ambulate with standard walker with CGA and had 1LOB. NO goals are met today due to fatigue     Pt will benefit from skilled therapeutic intervention in order to improve on the following deficits Abnormal gait   Clinical Impairments Affecting Rehab Potential High fall potential.  HOH.   PT Frequency 2x / week   PT Next Visit Plan Bilateral lLE strengthening.  Gait and balance activities.  TRy 3# wts with step up and 5# on LAQs   Consulted and Agree with Plan of Care Patient        Problem List Patient Active Problem List   Diagnosis Date Noted  . Muscular deconditioning 09/17/2015  . Other specified  hypothyroidism 09/09/2015  . CAP (community acquired pneumonia) 09/09/2015  . Medullary carcinoma ([place]) 09/09/2015  . Adrenal insufficiency ([place]) 09/09/2015  . Hypotension 09/09/2015  . Hyponatremia 09/09/2015  . Hypokalemia 09/09/2015    Amaira Safley,[name], PTA 01/20/2016, 6:42 PM  [facility] [place], [place], [address] Phone: [phone]   Fax:  [phone]  Name: ARISTEO HANKERSON MRN: [id number] Date of Birth: [birth year]

## 2016-01-25 ENCOUNTER — Ambulatory Visit: Payer: Medicaid Other | Admitting: *Deleted

## 2016-01-25 DIAGNOSIS — R2681 Unsteadiness on feet: Secondary | ICD-10-CM

## 2016-01-25 DIAGNOSIS — R269 Unspecified abnormalities of gait and mobility: Secondary | ICD-10-CM

## 2016-01-25 DIAGNOSIS — R29898 Other symptoms and signs involving the musculoskeletal system: Secondary | ICD-10-CM

## 2016-01-25 NOTE — Therapy (Signed)
[facility] [place], [place], [address] Phone: [phone]   Fax:  [phone]  Physical Therapy Treatment  Patient Details  Name: James Hoffman MRN: [id number] Date of Birth: 1996-10-03 Referring Provider: Kenn File MD.  Encounter Date: 01/25/2016      PT End of Session - 01/25/16 1545    Visit Number 18   Number of Visits 20   Date for PT Re-Evaluation 01/31/16   Authorization Type medicaid authorization to 01-31-16   PT Start Time 3300   PT Stop Time 1607   PT Time Calculation (min) 52 min      Past Medical History  Diagnosis Date  . Thyroid disease   . Cancer ([place])     brain tumor on brain stem  . Adrenal insufficiency ([place])   . Hydrocephalus     Past Surgical History  Procedure Laterality Date  . Brain tumor removal  August 2012    There were no vitals filed for this visit.  Visit Diagnosis:  Unsteadiness  Abnormality of gait  Weakness of both lower extremities      Subjective Assessment - 01/25/16 1528    Subjective Tired again today   Limitations Walking   How long can you walk comfortably? Just around house.   Patient Stated Goals Would like to better.   Currently in Pain? No/denies                         [facility] Adult PT Treatment/Exercise - 01/25/16 0001    Ambulation/Gait   Ambulation/Gait Yes   Ambulation/Gait Assistance 4: Min assist   Ambulation Distance (Feet) 180 Feet   Assistive device Standard walker   Gait Pattern Decreased stride length;Decreased dorsiflexion - right;Decreased dorsiflexion - left;Shuffle;Scissoring;Ataxic;Trunk flexed;Poor foot clearance - left;Poor foot clearance - right   Ambulation Surface Level   Gait Comments CGA today. Pt was tired and his balance was a littlle worse today   Exercises   Exercises Knee/Hip   Knee/Hip Exercises: Aerobic   Nustep Level 5x 15 minutes.    Knee/Hip Exercises: Standing   Knee Flexion 10  reps;Strengthening;Both;2 sets  TOE TAPS 8 in block, keep good alignment 3#LT, RT 4#    Lateral Step Up 10 reps;Hand Hold: 2;Step Height: 8";1 set;Both  3# ankleweight LT, RT 4#   Knee/Hip Exercises: Seated   Long Arc Quad Strengthening;Both;10 reps;Weights;3 sets  5#LT,  7.5# RT   Long Arc Quad Weight 5 lbs.   Sit to Sand 10 reps;with UE support  SB/CGA for balance upon standing                     PT Long Term Goals - 01/04/16 1449    PT LONG TERM GOAL #1   Title Ind with a HEP.   Baseline No knowledge of appropriate ther ex.   Time 8   Period Weeks   Status Achieved   PT LONG TERM GOAL #2   Title Increase bilateral LE strength to a solid 4/5.  MET for RT LE   Baseline Bilateral LE strength grades from 3- to 4-/5.   Time 8   Period Weeks   Status Partially Met   PT LONG TERM GOAL #3   Title Sit to stand with CGA.   Baseline Up to moderate assist required today.   Time 8   Period Weeks   Status Achieved   PT LONG TERM GOAL #4   Title Walk  in clinic with CGA/SBA with a cane 500 feet.   Baseline Patient requires HHA to ambulate safely.   Time 8   Period Weeks   Status On-going               Plan - 01/25/16 1546    Clinical Impression Statement Pt still did fairly well even though he was tired before comiing to PT. He was able to ambulate farther today using standard walker and had no LOB episodes. He also tolerated increased resistance on some tof the exs and did well. During sit to stand, he only  needed  assistance to regain balance x2.   Pt will benefit from skilled therapeutic intervention in order to improve on the following deficits Abnormal gait   Clinical Impairments Affecting Rehab Potential High fall potential.  HOH.   PT Frequency 2x / week   PT Next Visit Plan Bilateral lLE strengthening.  Gait and balance activities.  TRy 3# wts with step up and 5# on LAQs   DC after next visit due to insurance date ends on 01-31-16   Consulted and Agree  with Plan of Care Patient        Problem List Patient Active Problem List   Diagnosis Date Noted  . Muscular deconditioning 09/17/2015  . Other specified hypothyroidism 09/09/2015  . CAP (community acquired pneumonia) 09/09/2015  . Medullary carcinoma ([address]) 09/09/2015  . Adrenal insufficiency ([place]) 09/09/2015  . Hypotension 09/09/2015  . Hyponatremia 09/09/2015  . Hypokalemia 09/09/2015    Konstantina Nachreiner,[name], PTA 01/25/2016, 6:15 PM  [facility] [place], [place], [address] Phone: [phone]   Fax:  [phone]  Name: James Hoffman MRN: [id number] Date of Birth: [birth year]

## 2016-01-27 ENCOUNTER — Ambulatory Visit: Payer: Medicaid Other | Admitting: *Deleted

## 2016-01-27 DIAGNOSIS — R269 Unspecified abnormalities of gait and mobility: Secondary | ICD-10-CM

## 2016-01-27 DIAGNOSIS — R29898 Other symptoms and signs involving the musculoskeletal system: Secondary | ICD-10-CM

## 2016-01-27 DIAGNOSIS — R2681 Unsteadiness on feet: Secondary | ICD-10-CM

## 2016-01-31 NOTE — Therapy (Signed)
[facility] [place], [place], [address] Phone: [phone]   Fax:  [phone]  Physical Therapy Treatment  Patient Details  Name: James Hoffman MRN: [id number] Date of Birth: [birth year] Referring Provider: Kenn File MD.  Encounter Date: 01/27/2016    Past Medical History  Diagnosis Date  . Thyroid disease   . 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 Adrenal insufficiency ([place])   . Hydrocephalus     Past Surgical History  Procedure Laterality Date  . Brain tumor removal  August 2012    There were no vitals filed for this visit.  Visit Diagnosis:  Unsteadiness  Abnormality of gait  Weakness of both lower extremities                                    PT Long Term Goals - 01/27/16 1544    PT LONG TERM GOAL #1   Title Ind with a HEP.   Baseline No knowledge of appropriate ther ex.   Time 8   Period Weeks   Status Achieved   PT LONG TERM GOAL #2   Title Increase bilateral LE strength to a solid 4/5.   Baseline Bilateral LE strength grades from 3- to 4-/5.   Time 8   Period Weeks   Status Partially Met  4-/5 strength   PT LONG TERM GOAL #3   Title Sit to stand with CGA.   Time 8   Period Weeks   Status Achieved   PT LONG TERM GOAL #4   Title Walk in clinic with CGA/SBA with a cane 500 feet.   Baseline Patient requires HHA to ambulate safely.   Time 8   Period Weeks   Status Partially Met  Pt was able to ambulate with standard walker x 600 ft today with CGA. Not safe with [facility]               Problem List Patient Active Problem List   Diagnosis Date Noted  . Muscular deconditioning 09/17/2015  . Other specified hypothyroidism 09/09/2015  . CAP (community acquired pneumonia) 09/09/2015  . Medullary carcinoma ([place]) 09/09/2015  . Adrenal insufficiency ([place]) 09/09/2015  . Hypotension 09/09/2015  . Hyponatremia 09/09/2015  . Hypokalemia  09/09/2015   PHYSICAL THERAPY DISCHARGE SUMMARY  Visits from Start of Care: 19  Current functional level related to goals / functional outcomes: Please see above.   Remaining deficits: Continued LE weakness and walker required for ambulation.   Education / Equipment: HEP.  Plan: Patient agrees to discharge.  Patient goals were partially met. Patient is being discharged due to                                                     ?????      [name], [place] MPT 01/31/2016, 6:11 PM  [facility] [place], [place], [address] Phone: [phone]   Fax:  [phone]  Name: FAARIS ARIZPE MRN: [id number] Date of Birth: [birth year]

## 2016-03-23 ENCOUNTER — Encounter: Payer: Self-pay | Admitting: Family Medicine

## 2016-03-23 ENCOUNTER — Ambulatory Visit (INDEPENDENT_AMBULATORY_CARE_PROVIDER_SITE_OTHER): Payer: Medicare Other | Admitting: Family Medicine

## 2016-03-23 VITALS — BP 112/72 | HR 90 | Temp 97.4°F | Ht 70.0 in

## 2016-03-23 DIAGNOSIS — R29898 Other symptoms and signs involving the musculoskeletal system: Secondary | ICD-10-CM | POA: Diagnosis not present

## 2016-03-23 DIAGNOSIS — E2740 Unspecified adrenocortical insufficiency: Secondary | ICD-10-CM

## 2016-03-23 DIAGNOSIS — H6121 Impacted cerumen, right ear: Secondary | ICD-10-CM

## 2016-03-23 MED ORDER — PREDNISONE 5 MG PO TABS: 5.0000 mg | ORAL_TABLET | Freq: Every day | ORAL | Status: DC

## 2016-03-23 NOTE — Progress Notes (Signed)
   HPI  Patient presents today here with his grandmother for follow-up.  Patient explains that recently he is ran out of prednisone, he has a history of adrenal insufficiency diagnosed at [facility]. He had an a.m. cortisol that was measured to be 0.8 mcg/dL, followed by an ACTH stim test with response of 3.8 mcg/dL  He is doing well on thyroid medication.  He did very well with physical therapy and would like to go back, he feels that his physical conditioning has decreased since stopping physical therapy. He was making good progress walking with a walker, now he's not doing as much.  He is trying to do exercises on his own and not quite making as much progress as he hoped.  His mother has been incarcerated and will be so until November of this year, about 6 months. His grandmother is helping him currently, she would like him to have specialist closer to home.  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  # Adrenal insufficiency Refilled prednisone Refer to endocrinology to be established with adult endocrinology, he has good documentation of adrenal insufficiency diagnosis in care-everywhere  # Muscular deconditioning Status post medulloblastoma removal Refer to physical therapy  # Impacted cerumen Washed out in the clinic  today with not much improvement in hearing, they feel his hearing is stable from after the medulloblastoma surgery Offered ENT referral if they would like more detailed assessment    Orders Placed This Encounter  Procedures  . Ambulatory referral to Endocrinology    Referral Priority:  Routine    Referral Type:  Consultation    Referral Reason:  Specialty Services Required    Number of Visits Requested:  1  . Ambulatory referral to Physical Therapy    Referral Priority:  Routine    Referral Type:  Physical Medicine    Referral Reason:  Specialty Services Required    Requested Specialty:  Physical Therapy    Number of Visits Requested:  1    Meds ordered this encounter  Medications  . predniSONE (DELTASONE) 5 MG tablet    Sig: Take 1 tablet (5 mg total) by mouth daily with breakfast.    Dispense:  30 tablet    Refill:  [place], MD [facility] 03/23/2016, 5:29 PM

## 2016-03-23 NOTE — Patient Instructions (Signed)
Great to see you!  I have refilled the prednisone  I have written an order for endocrinology  I have also written an order for Physical therapy, they will call you or you can call them.

## 2016-04-04 ENCOUNTER — Ambulatory Visit: Payer: Medicare Other | Attending: Family Medicine | Admitting: Physical Therapy

## 2016-04-04 DIAGNOSIS — M6281 Muscle weakness (generalized): Secondary | ICD-10-CM | POA: Diagnosis not present

## 2016-04-04 DIAGNOSIS — R2681 Unsteadiness on feet: Secondary | ICD-10-CM | POA: Insufficient documentation

## 2016-04-04 NOTE — Therapy (Signed)
[facility] [place], [place], [address] Phone: [phone]   Fax:  [phone]  Physical Therapy Evaluation  Patient Details  Name: James Hoffman MRN: [id number] Date of Birth: Mar 13, 1996 Referring Provider: Kenn File MD.  Encounter Date: 04/04/2016      PT End of Session - 04/04/16 1711    Visit Number 1   Number of Visits 16   Date for PT Re-Evaluation 06/03/16   PT Start Time 0145   PT Stop Time 0233   PT Time Calculation (min) 48 min   Activity Tolerance Patient tolerated treatment well   Behavior During Therapy [facility] for tasks assessed/performed      Past Medical History  Diagnosis Date  . Thyroid disease   . 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 Adrenal insufficiency ([place])   . Hydrocephalus     Past Surgical History  Procedure Laterality Date  . Brain tumor removal  August 2012    There were no vitals filed for this visit.       Subjective Assessment - 04/04/16 1710    Subjective I'm doing better than before.   Limitations Walking   Patient Stated Goals I want to get stronger and walk better.   Currently in Pain? No/denies            [facility] PT Assessment - 04/04/16 0001    Assessment   Medical Diagnosis Muscular deconditioning.   Referring Provider Kenn File MD.   Onset Date/Surgical Date --  6 years.   Precautions   Precautions Fall   Precaution Comments --  HOH   Restrictions   Weight Bearing Restrictions No   Balance Screen   Has the patient fallen in the past 6 months Yes   Has the patient had a decrease in activity level because of a fear of falling?  No   Is the patient reluctant to leave their home because of a fear of falling?  No   Home Environment   Living Environment Private residence   Prior Function   Level of Independence Independent with household mobility with device   Coordination   Gross Motor Movements are Fluid and Coordinated No    Fine Motor Movements are Fluid and Coordinated No   Heel Shin Test --  Performed easiliy.   Posture/Postural Control   Posture/Postural Control Postural limitations   Postural Limitations Rounded Shoulders;Forward head   ROM / Strength   AROM / PROM / Strength AROM;Strength   AROM   Overall AROM Comments Bilateral LE is now [facility].   Strength   Strength Assessment Site Hip;Knee;Ankle   Right/Left Hip Right;Left   Right Hip Flexion 4-/5   Right Hip Extension 3-/5   Right Hip ABduction 4-/5   Left Hip Flexion 4-/5   Left Hip Extension 3-/5   Left Hip ABduction 4-/5   Right/Left Knee Right;Left   Right Knee Flexion 4/5   Right Knee Extension 4/5   Left Knee Flexion 4/5   Left Knee Extension 4/5   Right/Left Ankle Right;Left   Right Ankle Dorsiflexion 3+/5   Left Ankle Dorsiflexion 3+/5   Transfers   Transfers Sit to Stand   Sit to Stand With upper extremity assist   Ambulation/Gait   Gait Pattern Decreased step length - right;Decreased step length - left;Decreased stance time - right;Decreased stance time - left;Decreased stride length;Decreased dorsiflexion - right;Decreased dorsiflexion - left;Scissoring;Ataxic;Poor foot clearance - left;Poor foot clearance - right  Gait Comments With a standard walker and CGA.   Balance   Balance Assessed --  Positive Romberg test.                   [facility] Adult PT Treatment/Exercise - 04/04/16 0001    Exercises   Exercises Knee/Hip   Knee/Hip Exercises: Aerobic   Nustep Level 4 x 10 minutes.                  PT Short Term Goals - 04/04/16 1735    PT SHORT TERM GOAL #1   Title Ind with an initial HEP.   Time 2   Period Weeks   Status New           PT Long Term Goals - 04/04/16 1736    PT LONG TERM GOAL #1   Title Ind with an advanced HEP.   Time 8   Period Weeks   Status New   PT LONG TERM GOAL #2   Title Increase bilateral LE strength to a solid 4+/5.   Time 8   Period Weeks   Status New   PT  LONG TERM GOAL #3   Title Sit to stand x 5 with SBA.   Time 8   Period Weeks   Status New   PT LONG TERM GOAL #4   Title Walk in clinic with CGA/SBA with a cane 500 feet.   Time 8   Period Weeks   Status New               Plan - 04/04/16 1712    Clinical Impression Statement The patient was diagnosed with Medullary carcinoma when he was 20 years old. Since last seen the patient feels stronger.  He uses a standard walker.  He is motivated to improve and wants to work hard in physical therapy..   Rehab Potential Good   PT Frequency 2x / week   PT Duration 8 weeks   PT Treatment/Interventions ADLs/Self Care Home Management;Therapeutic exercise;Therapeutic activities;Gait training;Neuromuscular re-education;Patient/family education   PT Next Visit Plan Nustep; bil lE strengthening; gait and balance activites.   Consulted and Agree with Plan of Care Patient      Patient will benefit from skilled therapeutic intervention in order to improve the following deficits and impairments:  Abnormal gait, Decreased activity tolerance, Decreased strength  Visit Diagnosis: Unsteadiness on feet - Plan: PT plan of care cert/re-cert  Muscle weakness (generalized) - Plan: PT plan of care cert/re-cert      G-Codes - [id number]    Functional Assessment Tool Used Clinical judgement.   Functional Limitation Mobility: Walking and moving around   Mobility: Walking and Moving Around Current Status [phone]) At least 60 percent but less than 80 percent impaired, limited or restricted   Mobility: Walking and Moving Around Goal Status [phone]) At least 20 percent but less than 40 percent impaired, limited or restricted       Problem List Patient Active Problem List   Diagnosis Date Noted  . Muscular deconditioning 09/17/2015  . Other specified hypothyroidism 09/09/2015  . CAP (community acquired pneumonia) 09/09/2015  . Medullary carcinoma ([place]) 09/09/2015  . Adrenal insufficiency ([place])  09/09/2015  . Hypotension 09/09/2015  . Hyponatremia 09/09/2015  . Hypokalemia 09/09/2015    Arwen Haseley, [place] MPT 04/04/2016, 5:45 PM  [facility] [address] [place], [place], [address] Phone: [phone]   Fax:  [phone]  Name: James Hoffman MRN: [id number] Date of Birth: 07/02/1996

## 2016-04-06 ENCOUNTER — Ambulatory Visit: Payer: Medicare Other | Admitting: Family Medicine

## 2016-04-11 ENCOUNTER — Ambulatory Visit: Payer: Medicare Other | Admitting: *Deleted

## 2016-04-11 DIAGNOSIS — R2681 Unsteadiness on feet: Secondary | ICD-10-CM | POA: Diagnosis not present

## 2016-04-11 DIAGNOSIS — M6281 Muscle weakness (generalized): Secondary | ICD-10-CM

## 2016-04-11 NOTE — Therapy (Signed)
[facility] [place], [place], [address] Phone: [phone]   Fax:  [phone]  Physical Therapy Treatment  Patient Details  Name: James Hoffman MRN: [id number] Date of Birth: 1996/08/27 Referring Provider: Kenn File MD.  Encounter Date: 04/11/2016      PT End of Session - 04/11/16 1635    Visit Number 2   Number of Visits 16   Date for PT Re-Evaluation 06/03/16   PT Start Time [id number]   PT Stop Time [id number]   PT Time Calculation (min) 49 min      Past Medical History  Diagnosis Date  . Thyroid disease   . 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 Adrenal insufficiency ([place])   . Hydrocephalus     Past Surgical History  Procedure Laterality Date  . Brain tumor removal  August 2012    There were no vitals filed for this visit.      Subjective Assessment - 04/11/16 1530    Subjective I'm doing better than before. a little stronger   Limitations Walking   Patient Stated Goals I want to get stronger and walk better.                         [place] Adult PT Treatment/Exercise - 04/11/16 0001    Transfers   Transfers Sit to Stand   Ambulation/Gait   Ambulation Distance (Feet) 150 Feet   Assistive device Standard walker   Gait Pattern Decreased step length - right;Decreased step length - left;Decreased stance time - right;Decreased stance time - left;Decreased stride length;Decreased dorsiflexion - right;Decreased dorsiflexion - left;Scissoring;Ataxic;Poor foot clearance - left;Poor foot clearance - right   Gait Comments With a standard walker and CGA. Cues to keep walker closer to him   Exercises   Exercises Knee/Hip   Knee/Hip Exercises: Aerobic   Nustep Level 4 x 10 minutes.   Knee/Hip Exercises: Standing   Forward Step Up Right;Left;3 sets;10 reps;Step Height: 6"  needs to go up on RT and down on LT due to hyper extension L   Rocker Board 3 minutes  PF/DF , balance    Knee/Hip Exercises: Seated   Long Arc Quad Strengthening;3 sets;10 reps;Right;Left  5# LT, 71/2 # RT work on eccentric control   Sit to [employer] 10 reps;with UE support  CGA for balance                  PT Short Term Goals - 04/04/16 1735    PT SHORT TERM GOAL #1   Title Ind with an initial HEP.   Time 2   Period Weeks   Status New           PT Long Term Goals - 04/04/16 1736    PT LONG TERM GOAL #1   Title Ind with an advanced HEP.   Time 8   Period Weeks   Status New   PT LONG TERM GOAL #2   Title Increase bilateral LE strength to a solid 4+/5.   Time 8   Period Weeks   Status New   PT LONG TERM GOAL #3   Title Sit to stand x 5 with SBA.   Time 8   Period Weeks   Status New   PT LONG TERM GOAL #4   Title Walk in clinic with CGA/SBA with a cane 500 feet.   Time 8   Period Weeks   Status  New               Plan - 04/11/16 1646    Clinical Impression Statement Pt did fairly well today with Rx. He ambulates with a standard walker, but advances it too far in front of him when walking and needs V/Cs for technique. He did good with sit to stand ,but needed 1 UE assist and CGA after standing. Goals are ongoing     Rehab Potential Good   PT Frequency 2x / week   PT Duration 8 weeks   PT Treatment/Interventions ADLs/Self Care Home Management;Therapeutic exercise;Therapeutic activities;Gait training;Neuromuscular re-education;Patient/family education   PT Next Visit Plan Nustep; bil lE strengthening; gait and balance activites.   Consulted and Agree with Plan of Care Patient      Patient will benefit from skilled therapeutic intervention in order to improve the following deficits and impairments:  Abnormal gait, Decreased activity tolerance, Decreased strength  Visit Diagnosis: Unsteadiness on feet  Muscle weakness (generalized)     Problem List Patient Active Problem List   Diagnosis Date Noted  . Muscular deconditioning 09/17/2015  . Other  specified hypothyroidism 09/09/2015  . CAP (community acquired pneumonia) 09/09/2015  . Medullary carcinoma ([place]) 09/09/2015  . Adrenal insufficiency ([place]) 09/09/2015  . Hypotension 09/09/2015  . Hyponatremia 09/09/2015  . Hypokalemia 09/09/2015    RAMSEUR,[name], PTA 04/11/2016, 5:09 PM  [facility] [address] [place], [place], [address] Phone: [phone]   Fax:  [phone]  Name: James Hoffman MRN: [id number] Date of Birth: Aug 25, 1996

## 2016-04-14 ENCOUNTER — Ambulatory Visit (INDEPENDENT_AMBULATORY_CARE_PROVIDER_SITE_OTHER): Payer: Medicare Other | Admitting: Family Medicine

## 2016-04-14 ENCOUNTER — Encounter: Payer: Self-pay | Admitting: Family Medicine

## 2016-04-14 VITALS — BP 97/66 | HR 100 | Temp 97.6°F | Ht 70.0 in

## 2016-04-14 DIAGNOSIS — E2740 Unspecified adrenocortical insufficiency: Secondary | ICD-10-CM | POA: Diagnosis not present

## 2016-04-14 DIAGNOSIS — R29898 Other symptoms and signs involving the musculoskeletal system: Secondary | ICD-10-CM

## 2016-04-14 DIAGNOSIS — D649 Anemia, unspecified: Secondary | ICD-10-CM | POA: Diagnosis not present

## 2016-04-14 DIAGNOSIS — E038 Other specified hypothyroidism: Secondary | ICD-10-CM

## 2016-04-14 NOTE — Progress Notes (Signed)
   HPI  Patient presents today here for follow-up.  Patient is doing very well with physical therapy. He feels much stronger, his mother states that he is eating better.  They state they were contacted and told to get labs, however they're not sure which labs the knee.  An appointment for endocrinology in about 2 weeks. He has adrenal insufficiency.  He's doing well with his current prednisone dose  PMH: Smoking status noted ROS: Per HPI  Objective: BP 97/66 mmHg  Pulse 100  Temp(Src) 97.6 F (36.4 C) (Oral)  Ht '5\' 10"'$  (1.778 m)  Wt  Gen: NAD, alert, cooperative with exam HEENT: NCAT, TMs visible bilaterally and normal CV: RRR, good S1/S2, no murmur Resp: CTABL, no wheezes, non-labored Ext: No edema, warm Neuro: Alert and oriented, No gross deficits  Assessment and plan:  # Normocytic anemia CBC He does have some fatigue and tiredness, however has poor sleep habits as well so this is unclear what the true etiology is  # Hypothyroidism Recheck TSH, continue Synthroid  # Muscular deconditioning Doing well up physical therapy, subjectively improving Reviewed physical therapy's notes, it sounds like they're pleased with his progress as well.  # adrenal insufficiency Encouraged to keep Endocrinology follow up     Orders Placed This Encounter  Procedures  . CBC with Differential  . CMP14+EGFR  . TSH    Laroy Apple, MD [facility] 04/14/2016, 5:07 PM

## 2016-04-14 NOTE — Patient Instructions (Signed)
Great to see you!  We will call within 1 week with labs    

## 2016-04-15 LAB — CBC WITH DIFFERENTIAL/PLATELET
BASOS: 0 %
Basophils Absolute: 0 10*3/uL (ref 0.0–0.2)
EOS (ABSOLUTE): 0.3 10*3/uL (ref 0.0–0.4)
Eos: 4 %
Hematocrit: 37.4 % — ABNORMAL LOW (ref 37.5–51.0)
Hemoglobin: 12.9 g/dL (ref 12.6–17.7)
IMMATURE GRANS (ABS): 0 10*3/uL (ref 0.0–0.1)
Immature Granulocytes: 0 %
LYMPHS ABS: 3.4 10*3/uL — AB (ref 0.7–3.1)
LYMPHS: 46 %
MCH: 31.6 pg (ref 26.6–33.0)
MCHC: 34.5 g/dL (ref 31.5–35.7)
MCV: 92 fL (ref 79–97)
MONOCYTES: 10 %
Monocytes Absolute: 0.7 10*3/uL (ref 0.1–0.9)
NEUTROS ABS: 3 10*3/uL (ref 1.4–7.0)
NEUTROS PCT: 40 %
PLATELETS: 246 10*3/uL (ref 150–379)
RBC: 4.08 x10E6/uL — ABNORMAL LOW (ref 4.14–5.80)
RDW: 13.8 % (ref 12.3–15.4)
WBC: 7.5 10*3/uL (ref 3.4–10.8)

## 2016-04-15 LAB — CMP14+EGFR
A/G RATIO: 2.1 (ref 1.2–2.2)
ALBUMIN: 4.1 g/dL (ref 3.5–5.5)
ALK PHOS: 72 IU/L (ref 39–117)
ALT: 6 IU/L (ref 0–44)
AST: 12 IU/L (ref 0–40)
BUN / CREAT RATIO: 15 (ref 9–20)
BUN: 12 mg/dL (ref 6–20)
CHLORIDE: 91 mmol/L — AB (ref 96–106)
CO2: 23 mmol/L (ref 18–29)
Calcium: 9 mg/dL (ref 8.7–10.2)
Creatinine, Ser: 0.79 mg/dL (ref 0.76–1.27)
GFR calc non Af Amer: 129 mL/min/{1.73_m2} (ref >59)
GFR, EST AFRICAN AMERICAN: 149 mL/min/{1.73_m2} (ref >59)
GLUCOSE: 75 mg/dL (ref 65–99)
Globulin, Total: 2 g/dL (ref 1.5–4.5)
POTASSIUM: 4.1 mmol/L (ref 3.5–5.2)
Sodium: 134 mmol/L (ref 134–144)
TOTAL PROTEIN: 6.1 g/dL (ref 6.0–8.5)

## 2016-04-15 LAB — TSH: TSH: 0.549 u[IU]/mL (ref 0.450–4.500)

## 2016-04-18 ENCOUNTER — Ambulatory Visit: Payer: Medicare Other | Attending: Family Medicine | Admitting: Physical Therapy

## 2016-04-18 DIAGNOSIS — M6281 Muscle weakness (generalized): Secondary | ICD-10-CM | POA: Diagnosis not present

## 2016-04-18 DIAGNOSIS — R2681 Unsteadiness on feet: Secondary | ICD-10-CM | POA: Diagnosis not present

## 2016-04-18 NOTE — Therapy (Signed)
[facility] [place], [place], [address] Phone: [phone]   Fax:  [phone]  Physical Therapy Treatment  Patient Details  Name: ALEJO RAYO MRN: [id number] Date of Birth: 06-14-1996 Referring Provider: Kenn File MD.  Encounter Date: 04/18/2016      PT End of Session - 04/18/16 1800    Visit Number 3   Number of Visits 16   Date for PT Re-Evaluation 06/03/16   PT Start Time 0315   PT Stop Time 0404   PT Time Calculation (min) 49 min   Activity Tolerance Patient tolerated treatment well   Behavior During Therapy [facility] for tasks assessed/performed      Past Medical History  Diagnosis Date  . Thyroid disease   . 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 Adrenal insufficiency ([place])   . Hydrocephalus     Past Surgical History  Procedure Laterality Date  . Brain tumor removal  August 2012    There were no vitals filed for this visit.      Subjective Assessment - 04/18/16 1759    Subjective I walk to be able to walk by the time my mom comes home (November).   Limitations Walking   Patient Stated Goals I want to get stronger and walk better.                                   PT Short Term Goals - 04/04/16 1735    PT SHORT TERM GOAL #1   Title Ind with an initial HEP.   Time 2   Period Weeks   Status New           PT Long Term Goals - 04/04/16 1736    PT LONG TERM GOAL #1   Title Ind with an advanced HEP.   Time 8   Period Weeks   Status New   PT LONG TERM GOAL #2   Title Increase bilateral LE strength to a solid 4+/5.   Time 8   Period Weeks   Status New   PT LONG TERM GOAL #3   Title Sit to stand x 5 with SBA.   Time 8   Period Weeks   Status New   PT LONG TERM GOAL #4   Title Walk in clinic with CGA/SBA with a cane 500 feet.   Time 8   Period Weeks   Status New             Patient will benefit from skilled therapeutic intervention  in order to improve the following deficits and impairments:     Visit Diagnosis: Unsteadiness on feet  Muscle weakness (generalized)     Problem List Patient Active Problem List   Diagnosis Date Noted  . Normocytic anemia 04/14/2016  . Muscular deconditioning 09/17/2015  . Other specified hypothyroidism 09/09/2015  . CAP (community acquired pneumonia) 09/09/2015  . Medullary carcinoma ([place]) 09/09/2015  . Adrenal insufficiency ([place]) 09/09/2015  . Hypotension 09/09/2015  . Hyponatremia 09/09/2015  . Hypokalemia 09/09/2015   Treatment:  Nustep level 5 x 15 minutes and then @ level 2 x 5 minutes.  Rockerboard x 7 minutes for neuro-re-education.  Gait training with quad cane on right---pre and mid-gait training activities (11 minutes).   Great job today.  Arvid Marengo, [place] MPT 04/18/2016, 6:10 PM  [facility] [place], [place],  [place] Phone: [phone]   Fax:  [phone]  Name: ASAHEL GREALISH MRN: [id number] Date of Birth: 1996/09/09

## 2016-04-20 ENCOUNTER — Ambulatory Visit: Payer: Medicare Other | Admitting: *Deleted

## 2016-04-20 DIAGNOSIS — R2681 Unsteadiness on feet: Secondary | ICD-10-CM | POA: Diagnosis not present

## 2016-04-20 DIAGNOSIS — M6281 Muscle weakness (generalized): Secondary | ICD-10-CM | POA: Diagnosis not present

## 2016-04-20 NOTE — Therapy (Signed)
[facility] [place], [place], [address] Phone: [phone]   Fax:  [phone]  Physical Therapy Treatment  Patient Details  Name: James Hoffman MRN: [id number] Date of Birth: [birth year] Referring Provider: Kenn File MD.  Encounter Date: 04/20/2016      PT End of Session - 04/20/16 1544    Visit Number 4   Number of Visits 16   Date for PT Re-Evaluation 06/03/16   PT Start Time [id number]   PT Stop Time 1602   PT Time Calculation (min) 47 min      Past Medical History  Diagnosis Date  . Thyroid disease   . 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 Adrenal insufficiency ([place])   . Hydrocephalus     Past Surgical History  Procedure Laterality Date  . Brain tumor removal  August 2012    There were no vitals filed for this visit.      Subjective Assessment - 04/20/16 1534    Subjective I want  to be able to walk by the time my mom comes home (November).  A little titred today   Limitations Walking   Patient Stated Goals I want to get stronger and walk better.                         [place] Adult PT Treatment/Exercise - 04/20/16 0001    Ambulation/Gait   Assistive device Rolling walker  RW today   Gait Pattern Decreased step length - right;Decreased step length - left;Decreased stance time - right;Decreased stance time - left;Decreased stride length;Decreased dorsiflexion - right;Decreased dorsiflexion - left;Scissoring;Ataxic;Poor foot clearance - left;Poor foot clearance - right   Gait Comments With a RW and CGA. Cues to keep walker closer to him   Exercises   Exercises Knee/Hip   Knee/Hip Exercises: Aerobic   Elliptical R5/  L5 x 1.5 minutes with manual guarding at LT knee to stabilize and prevent genu--recurvatum   Nustep Level 5 x 15 minutes.   Knee/Hip Exercises: Standing   Forward Step Up Right;Left;3 sets;10 reps;Step Height: 6"  needs to go up on RT and down on LT due to  hyper extension L   Rocker Board 5 minutes  PF/DF , balance    Knee/Hip Exercises: Seated   Long Arc Quad Strengthening;3 sets;10 reps;Right;Left  5# LT, 71/2 # RT work on eccentric control   Sit to [employer] 10 reps;with UE support  CGA for balance                  PT Short Term Goals - 04/04/16 1735    PT SHORT TERM GOAL #1   Title Ind with an initial HEP.   Time 2   Period Weeks   Status New           PT Long Term Goals - 04/04/16 1736    PT LONG TERM GOAL #1   Title Ind with an advanced HEP.   Time 8   Period Weeks   Status New   PT LONG TERM GOAL #2   Title Increase bilateral LE strength to a solid 4+/5.   Time 8   Period Weeks   Status New   PT LONG TERM GOAL #3   Title Sit to stand x 5 with SBA.   Time 8   Period Weeks   Status New   PT LONG TERM GOAL #4   Title Walk  in clinic with CGA/SBA with a cane 500 feet.   Time 8   Period Weeks   Status New               Plan - 04/20/16 1612    Clinical Impression Statement Pt did fairly well today with Exs and act.'s, but had less energy than last Rx. He wanted to try the RW today and did fairly well' but needed VC's to stay closer to RW. He did better with sit to stand today and was able to control standing balance easier. He still  has trouble controling genu recurvatum in LT knee and needs assistance at times. Goals are ongoing at this time.   Rehab Potential Good   PT Frequency 2x / week   PT Duration 8 weeks   PT Treatment/Interventions ADLs/Self Care Home Management;Therapeutic exercise;Therapeutic activities;Gait training;Neuromuscular re-education;Patient/family education   PT Next Visit Plan Nustep; bil lE strengthening; gait and balance activites.   Consulted and Agree with Plan of Care Patient      Patient will benefit from skilled therapeutic intervention in order to improve the following deficits and impairments:  Abnormal gait, Decreased activity tolerance, Decreased strength  Visit  Diagnosis: Unsteadiness on feet  Muscle weakness (generalized)     Problem List Patient Active Problem List   Diagnosis Date Noted  . Normocytic anemia 04/14/2016  . Muscular deconditioning 09/17/2015  . Other specified hypothyroidism 09/09/2015  . CAP (community acquired pneumonia) 09/09/2015  . Medullary carcinoma ([place]) 09/09/2015  . Adrenal insufficiency ([place]) 09/09/2015  . Hypotension 09/09/2015  . Hyponatremia 09/09/2015  . Hypokalemia 09/09/2015    RAMSEUR,[name], PTA 04/20/2016, 6:11 PM  [facility] [place], [place], [address] Phone: [phone]   Fax:  [phone]  Name: James Hoffman MRN: [id number] Date of Birth: 1996-02-17

## 2016-04-25 ENCOUNTER — Ambulatory Visit: Payer: Medicare Other | Admitting: Physical Therapy

## 2016-04-25 DIAGNOSIS — M6281 Muscle weakness (generalized): Secondary | ICD-10-CM

## 2016-04-25 DIAGNOSIS — R2681 Unsteadiness on feet: Secondary | ICD-10-CM

## 2016-04-25 NOTE — Therapy (Signed)
[facility] [place], [place], [address] Phone: [phone]   Fax:  [phone]  Physical Therapy Treatment  Patient Details  Name: JAYZ MAU MRN: [id number] Date of Birth: [birth year] Referring Provider: Kenn File MD.  Encounter Date: 04/25/2016      PT End of Session - 04/25/16 1747    Visit Number 5   Number of Visits 16   Date for PT Re-Evaluation 06/03/16   PT Start Time 0304   PT Stop Time 0408   PT Time Calculation (min) 64 min   Equipment Utilized During Treatment Back brace   Activity Tolerance Patient tolerated treatment well  Patient need require rest periods.      Past Medical History  Diagnosis Date  . Thyroid disease   . 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 Adrenal insufficiency ([place])   . Hydrocephalus     Past Surgical History  Procedure Laterality Date  . Brain tumor removal  August 2012    There were no vitals filed for this visit.      Subjective Assessment - 04/25/16 1543    Subjective I fell coming out of the house and bruised my left arm.   Limitations Walking   Patient Stated Goals I want to get stronger and walk better.                         [place] Adult PT Treatment/Exercise - 04/25/16 0001    Ambulation/Gait   Gait Comments Gait training with wide based QC and small based QC.  Patient did well overall but is still scissoring on occasion and require minimal assist x 4 due to balance loss.  For the majority of his gait training her required gait belt assist.   Exercises   Exercises Knee/Hip;Ankle   Knee/Hip Exercises: Aerobic   Nustep Level 4 x 15 minutes   Ankle Exercises: Standing   Rocker Board Limitations  Neuro re-education x 4 minutes and 4 minutes of assisted standing with manual balance challenges.                  PT Short Term Goals - 04/04/16 1735    PT SHORT TERM GOAL #1   Title Ind with an initial HEP.   Time  2   Period Weeks   Status New           PT Long Term Goals - 04/04/16 1736    PT LONG TERM GOAL #1   Title Ind with an advanced HEP.   Time 8   Period Weeks   Status New   PT LONG TERM GOAL #2   Title Increase bilateral LE strength to a solid 4+/5.   Time 8   Period Weeks   Status New   PT LONG TERM GOAL #3   Title Sit to stand x 5 with SBA.   Time 8   Period Weeks   Status New   PT LONG TERM GOAL #4   Title Walk in clinic with CGA/SBA with a cane 500 feet.   Time 8   Period Weeks   Status New             Patient will benefit from skilled therapeutic intervention in order to improve the following deficits and impairments:     Visit Diagnosis: Unsteadiness on feet  Muscle weakness (generalized)     Problem List Patient Active Problem List  Diagnosis Date Noted  . Normocytic anemia 04/14/2016  . Muscular deconditioning 09/17/2015  . Other specified hypothyroidism 09/09/2015  . CAP (community acquired pneumonia) 09/09/2015  . Medullary carcinoma ([place]) 09/09/2015  . Adrenal insufficiency ([place]) 09/09/2015  . Hypotension 09/09/2015  . Hyponatremia 09/09/2015  . Hypokalemia 09/09/2015    APPLEGATE, [place] MPT 04/25/2016, 6:06 PM  [facility] [place], [place], [address] Phone: [phone]   Fax:  [phone]  Name: HARVIR CASTLEBERRY MRN: [id number] Date of Birth: 26-Sep-1996

## 2016-04-27 ENCOUNTER — Ambulatory Visit: Payer: Medicare Other | Admitting: "Endocrinology

## 2016-04-27 ENCOUNTER — Ambulatory Visit: Payer: Medicare Other | Admitting: *Deleted

## 2016-04-27 DIAGNOSIS — M6281 Muscle weakness (generalized): Secondary | ICD-10-CM

## 2016-04-27 DIAGNOSIS — R2681 Unsteadiness on feet: Secondary | ICD-10-CM | POA: Diagnosis not present

## 2016-04-27 NOTE — Therapy (Signed)
[facility] [place], [place], [address] Phone: [phone]   Fax:  [phone]  Physical Therapy Treatment  Patient Details  Name: James Hoffman MRN: [id number] Date of Birth: May 09, 1996 Referring Provider: Kenn File MD.  Encounter Date: 04/27/2016      PT End of Session - 04/27/16 1616    Visit Number 6   Number of Visits 16   Date for PT Re-Evaluation 06/03/16   PT Start Time 1520   PT Stop Time 1611   PT Time Calculation (min) 51 min      Past Medical History  Diagnosis Date  . Thyroid disease   . 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 Adrenal insufficiency ([place])   . Hydrocephalus     Past Surgical History  Procedure Laterality Date  . Brain tumor removal  August 2012    There were no vitals filed for this visit.      Subjective Assessment - 04/27/16 1539    Subjective I fell coming out of the house and bruised my left arm. the other day. Hot today. not much energy today   Limitations Walking   Patient Stated Goals I want to get stronger and walk better.                         [place] Adult PT Treatment/Exercise - 04/27/16 0001    Ambulation/Gait   Ambulation Distance (Feet) 220 Feet   Assistive device Rolling walker  RW today   Gait Pattern Decreased step length - right;Decreased step length - left;Decreased stance time - right;Decreased stance time - left;Decreased stride length;Decreased dorsiflexion - right;Decreased dorsiflexion - left;Scissoring;Ataxic;Poor foot clearance - left;Poor foot clearance - right   Exercises   Exercises Knee/Hip;Ankle   Knee/Hip Exercises: Aerobic   Nustep Level 4 x 10 minutes   Knee/Hip Exercises: Standing   Other Standing Knee Exercises Neuro Re-ed  in // bars: Diagonal reaching with Bil. UEs 3x20 with CGA,   Inverted BOSU Ball with CGA   Knee/Hip Exercises: Seated   Long Arc Quad Strengthening;3 sets;10 reps;Right;Left  5# LT,  71/2 # RT work on eccentric control   Sit to [employer] 10 reps;with UE support  CGA for balance.  LOB x 2 with assistance to regain bal.                  PT Short Term Goals - 04/27/16 1629    PT SHORT TERM GOAL #1   Title Ind with an initial HEP.   Time 2   Period Weeks   Status Achieved           PT Long Term Goals - 04/27/16 1629    PT LONG TERM GOAL #1   Title Ind with an advanced HEP.   Baseline No knowledge of appropriate ther ex.   Time 8   Period Weeks   Status On-going   PT LONG TERM GOAL #2   Title Increase bilateral LE strength to a solid 4+/5.   Baseline Bilateral LE strength grades from 3- to 4-/5.   Time 8   Period Weeks   Status On-going   PT LONG TERM GOAL #3   Title Sit to stand x 5 with SBA.   Time 8   Period Weeks   Status Achieved   PT LONG TERM GOAL #4   Title Walk in clinic with CGA/SBA with a cane 500 feet.  Baseline Patient requires HHA to ambulate safely.   Time 8   Period Weeks   Status On-going               Plan - 04/27/16 1616    Clinical Impression Statement Pt did fair with Rx today, but was tired upon arrival. He was only able to perform 10 minutes on Nustep today due to fatigue. He was able to walk longer with RW today, but needed cues to keep walker closer to him and not lean forward. He did well with balance on floor, but needed  more assistance when on unstable  surface . He met LTG for standingx 5 with SBA. Other goals are on-going   Rehab Potential Good   PT Frequency 2x / week   PT Duration 8 weeks   PT Treatment/Interventions ADLs/Self Care Home Management;Therapeutic exercise;Therapeutic activities;Gait training;Neuromuscular re-education;Patient/family education   PT Next Visit Plan Nustep; bil lE strengthening; gait and balance activites.   Consulted and Agree with Plan of Care Patient      Patient will benefit from skilled therapeutic intervention in order to improve the following deficits and  impairments:  Abnormal gait, Decreased activity tolerance, Decreased strength  Visit Diagnosis: Unsteadiness on feet  Muscle weakness (generalized)     Problem List Patient Active Problem List   Diagnosis Date Noted  . Normocytic anemia 04/14/2016  . Muscular deconditioning 09/17/2015  . Other specified hypothyroidism 09/09/2015  . CAP (community acquired pneumonia) 09/09/2015  . Medullary carcinoma ([place]) 09/09/2015  . Adrenal insufficiency ([place]) 09/09/2015  . Hypotension 09/09/2015  . Hyponatremia 09/09/2015  . Hypokalemia 09/09/2015    Tykesha Konicki,[name], PTA 04/27/2016, 4:32 PM  [facility] [place], [place], [address] Phone: [phone]   Fax:  [phone]  Name: THOMA PAULSEN MRN: [id number] Date of Birth: 1995/12/22

## 2016-05-02 ENCOUNTER — Ambulatory Visit: Payer: Medicare Other | Admitting: Physical Therapy

## 2016-05-04 ENCOUNTER — Ambulatory Visit: Payer: Medicare Other | Admitting: Physical Therapy

## 2016-05-04 DIAGNOSIS — R2681 Unsteadiness on feet: Secondary | ICD-10-CM | POA: Diagnosis not present

## 2016-05-04 DIAGNOSIS — M6281 Muscle weakness (generalized): Secondary | ICD-10-CM

## 2016-05-04 NOTE — Therapy (Signed)
[facility] [place], [place], [address] Phone: [phone]   Fax:  [phone]  Physical Therapy Treatment  Patient Details  Name: James Hoffman MRN: [id number] Date of Birth: 08-18-1996 Referring Provider: Kenn File MD.  Encounter Date: 05/04/2016      PT End of Session - 05/04/16 1840    Visit Number 7   Number of Visits 16   Date for PT Re-Evaluation 06/03/16   PT Start Time [id number]   PT Stop Time [id number]  Patient a bit little and limited by faitigue.   PT Time Calculation (min) 36 min   Activity Tolerance Patient tolerated treatment well   Behavior During Therapy WFL for tasks assessed/performed      Past Medical History  Diagnosis Date  . Thyroid disease   . 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 Adrenal insufficiency ([place])   . Hydrocephalus     Past Surgical History  Procedure Laterality Date  . Brain tumor removal  August 2012    There were no vitals filed for this visit.      Subjective Assessment - 05/04/16 1842    Subjective Headache not as bad today but I still feel tired.   Limitations Walking   Patient Stated Goals I want to get stronger and walk better.                         [place] Adult PT Treatment/Exercise - 05/04/16 0001    Ambulation/Gait   Gait Pattern Decreased arm swing - left;Decreased step length - right;Decreased step length - left;Scissoring;Ataxic   Gait Comments Gait training times 2 for a total of 8 minutes with a QC on right.     Exercises   Exercises Knee/Hip   Knee/Hip Exercises: Aerobic   Nustep Level 4 x 15 minutes.                  PT Short Term Goals - 04/27/16 1629    PT SHORT TERM GOAL #1   Title Ind with an initial HEP.   Time 2   Period Weeks   Status Achieved           PT Long Term Goals - 05/04/16 1850    PT LONG TERM GOAL #1   Title Ind with an advanced HEP.   Baseline No knowledge of appropriate ther  ex.   Time 8   Period Weeks   Status On-going   PT LONG TERM GOAL #2   Title Increase bilateral LE strength to a solid 4+/5.   Time 8   Period Weeks   Status On-going   PT LONG TERM GOAL #3   Title Sit to stand x 5 with SBA.   Time 8   Period Weeks   Status Achieved   PT LONG TERM GOAL #4   Title Walk in clinic with CGA/SBA with a cane 500 feet.   Time 8   Period Weeks   Status On-going               Plan - 05/04/16 1847    Clinical Impression Statement Patient did gfair today.  He had a headache earlier this week and was limited by fatigue today.  His gait was quite ataxic today and he require gait belt assist due to loss of balance.   Rehab Potential Good   PT Frequency 2x / week   PT Duration 8  weeks   PT Treatment/Interventions ADLs/Self Care Home Management;Therapeutic exercise;Therapeutic activities;Gait training;Neuromuscular re-education;Patient/family education   PT Next Visit Plan Nustep; bil lE strengthening; gait and balance activites.      Patient will benefit from skilled therapeutic intervention in order to improve the following deficits and impairments:     Visit Diagnosis: Unsteadiness on feet  Muscle weakness (generalized)     Problem List Patient Active Problem List   Diagnosis Date Noted  . Normocytic anemia 04/14/2016  . Muscular deconditioning 09/17/2015  . Other specified hypothyroidism 09/09/2015  . CAP (community acquired pneumonia) 09/09/2015  . Medullary carcinoma ([place]) 09/09/2015  . Adrenal insufficiency ([place]) 09/09/2015  . Hypotension 09/09/2015  . Hyponatremia 09/09/2015  . Hypokalemia 09/09/2015    Seven Marengo, [place] MPT 05/04/2016, 6:51 PM  [facility] [address] [place], [place], [address] Phone: [phone]   Fax:  [phone]  Name: James Hoffman MRN: [id number] Date of Birth: 04-15-1996

## 2016-05-11 ENCOUNTER — Ambulatory Visit: Payer: Medicare Other | Admitting: *Deleted

## 2016-05-11 DIAGNOSIS — M6281 Muscle weakness (generalized): Secondary | ICD-10-CM

## 2016-05-11 DIAGNOSIS — R2681 Unsteadiness on feet: Secondary | ICD-10-CM

## 2016-05-11 NOTE — Therapy (Signed)
[facility] [place], [place], [address] Phone: [phone]   Fax:  [phone]  Physical Therapy Treatment  Patient Details  Name: James Hoffman MRN: [id number] Date of Birth: 1996-08-23 Referring Provider: Kenn File MD.  Encounter Date: 05/11/2016      PT End of Session - 05/11/16 1725    Visit Number 8   Number of Visits 16   Date for PT Re-Evaluation 06/03/16   PT Start Time [id number]   PT Stop Time 1606   PT Time Calculation (min) 51 min      Past Medical History  Diagnosis Date  . Thyroid disease   . 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 Adrenal insufficiency ([place])   . Hydrocephalus     Past Surgical History  Procedure Laterality Date  . Brain tumor removal  August 2012    There were no vitals filed for this visit.      Subjective Assessment - 05/11/16 1532    Subjective Doing a little better today, no headache   Limitations Walking   Patient Stated Goals I want to get stronger and walk better.                         [place] Adult PT Treatment/Exercise - 05/11/16 0001    Ambulation/Gait   Ambulation Distance (Feet) 225 Feet   Assistive device Standard walker;Large base quad cane  RW today   Gait Pattern Decreased arm swing - left;Decreased step length - right;Decreased step length - left;Scissoring;Ataxic   Gait Comments Pt did better today with advancing walker and he had a less ataxic gait  pattern. He also ambulated with [facility]  x 25 feet   Exercises   Exercises Knee/Hip   Knee/Hip Exercises: Aerobic   Nustep Level 6x 12 minutes.L4 x 3 mins   Knee/Hip Exercises: Standing   Forward Step Up Both;2 sets;20 reps;Step Height: 8"  Toe taps for control with 2# 's   Knee/Hip Exercises: [place] Strengthening;3 sets;10 reps;Right;Left  5# LT, 71/2 # RT work on eccentric control   Sit to [employer] 10 reps;with UE support  CGA for balance.  LOB x 1 with  assistance to regain bal.                  PT Short Term Goals - 04/27/16 1629    PT SHORT TERM GOAL #1   Title Ind with an initial HEP.   Time 2   Period Weeks   Status Achieved           PT Long Term Goals - 05/04/16 1850    PT LONG TERM GOAL #1   Title Ind with an advanced HEP.   Baseline No knowledge of appropriate ther ex.   Time 8   Period Weeks   Status On-going   PT LONG TERM GOAL #2   Title Increase bilateral LE strength to a solid 4+/5.   Time 8   Period Weeks   Status On-going   PT LONG TERM GOAL #3   Title Sit to stand x 5 with SBA.   Time 8   Period Weeks   Status Achieved   PT LONG TERM GOAL #4   Title Walk in clinic with CGA/SBA with a cane 500 feet.   Time 8   Period Weeks   Status On-going  Plan - 05/11/16 1728    Clinical Impression Statement Pt did better today. He was able to perform gait with walker with less ataxia today and keep the walker closer to him when advancing. He was also  able to ambulate with [facility], but had increased ataxia and a slower gait and required SBA/CGA.   Rehab Potential Good   PT Frequency 2x / week   PT Duration 8 weeks   PT Treatment/Interventions ADLs/Self Care Home Management;Therapeutic exercise;Therapeutic activities;Gait training;Neuromuscular re-education;Patient/family education   PT Next Visit Plan Nustep; bil lE strengthening; gait and balance activites.   Consulted and Agree with Plan of Care Patient      Patient will benefit from skilled therapeutic intervention in order to improve the following deficits and impairments:  Abnormal gait, Decreased activity tolerance, Decreased strength  Visit Diagnosis: Unsteadiness on feet  Muscle weakness (generalized)     Problem List Patient Active Problem List   Diagnosis Date Noted  . Normocytic anemia 04/14/2016  . Muscular deconditioning 09/17/2015  . Other specified hypothyroidism 09/09/2015  . CAP (community acquired  pneumonia) 09/09/2015  . Medullary carcinoma ([place]) 09/09/2015  . Adrenal insufficiency ([place]) 09/09/2015  . Hypotension 09/09/2015  . Hyponatremia 09/09/2015  . Hypokalemia 09/09/2015    Amylynn Fano,[name], PTA 05/11/2016, 5:35 PM  [facility] [place], [place], [address] Phone: [phone]   Fax:  [phone]  Name: James Hoffman MRN: [id number] Date of Birth: 07-05-1996

## 2016-05-18 ENCOUNTER — Ambulatory Visit: Payer: Medicare Other | Attending: Family Medicine | Admitting: *Deleted

## 2016-05-18 DIAGNOSIS — R2681 Unsteadiness on feet: Secondary | ICD-10-CM | POA: Insufficient documentation

## 2016-05-18 DIAGNOSIS — M6281 Muscle weakness (generalized): Secondary | ICD-10-CM | POA: Diagnosis not present

## 2016-05-18 NOTE — Therapy (Signed)
[facility] [place], [place], [address] Phone: [phone]   Fax:  [phone]  Physical Therapy Treatment  Patient Details  Name: James Hoffman MRN: [id number] Date of Birth: [birth year] Referring Provider: Kenn File MD.  Encounter Date: 05/18/2016      PT End of Session - 05/18/16 1615    Visit Number 9   Number of Visits 16   Date for PT Re-Evaluation 06/03/16   PT Start Time [id number]   PT Stop Time 1606   PT Time Calculation (min) 51 min      Past Medical History  Diagnosis Date  . Thyroid disease   . 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 Adrenal insufficiency ([place])   . Hydrocephalus     Past Surgical History  Procedure Laterality Date  . Brain tumor removal  August 2012    There were no vitals filed for this visit.      Subjective Assessment - 05/18/16 1520    Subjective Doing a little better today, no headache.   Limitations Walking   Patient Stated Goals I want to get stronger and walk better.   Currently in Pain? No/denies                         [facility] Adult PT Treatment/Exercise - 05/18/16 0001    Ambulation/Gait   Ambulation Distance (Feet) 175 Feet   Assistive device Large base quad cane   Gait Pattern Decreased arm swing - left;Decreased step length - right;Decreased step length - left;Scissoring;Ataxic   Ambulation Surface Level;Indoor   Gait Comments Gait training with wide based QC.  Patient did well overall but is still scissoring on occasion and require minimal assist x 2 due to balance loss.  For the majority of his gait training he required CG assist   Exercises   Exercises Knee/Hip   Knee/Hip Exercises: Aerobic   Nustep Level 5 x 15 minutes. UE and LE activity   Knee/Hip Exercises: Standing   Forward Step Up Both;2 sets;20 reps;Step Height: 8"  Toe taps for control with 2# 's, Try 3#s next Rx   Other Standing Knee Exercises Neuro Re-ed  in // bars:  Diagonal reaching with Bil. UEs 3x20 with CGA,   Inverted BOSU Ball with CGA   Knee/Hip Exercises: Seated   Sit to Sand 10 reps;with UE support  CGA for balance.  LOB x 2 with assistance to regain bal.                  PT Short Term Goals - 04/27/16 1629    PT SHORT TERM GOAL #1   Title Ind with an initial HEP.   Time 2   Period Weeks   Status Achieved           PT Long Term Goals - 05/04/16 1850    PT LONG TERM GOAL #1   Title Ind with an advanced HEP.   Baseline No knowledge of appropriate ther ex.   Time 8   Period Weeks   Status On-going   PT LONG TERM GOAL #2   Title Increase bilateral LE strength to a solid 4+/5.   Time 8   Period Weeks   Status On-going   PT LONG TERM GOAL #3   Title Sit to stand x 5 with SBA.   Time 8   Period Weeks   Status Achieved   PT LONG TERM  GOAL #4   Title Walk in clinic with CGA/SBA with a cane 500 feet.   Time 8   Period Weeks   Status On-going               Plan - 05/18/16 1616    Clinical Impression Statement Pt did fair today with RX. He was able to ambulate with [facility] today , but still needed V/C's to decrease advancement of LBQC. He had 1 LOB during gait and 2 during sit to stand. He was unable to meet LTG for Strength due to weakness 4-/5 today and  LTG for gait  due to balance deficit.    Rehab Potential Good   PT Frequency 2x / week   PT Duration 8 weeks   PT Treatment/Interventions ADLs/Self Care Home Management;Therapeutic exercise;Therapeutic activities;Gait training;Neuromuscular re-education;Patient/family education   PT Next Visit Plan Nustep; bil lE strengthening; gait and balance activites.   Consulted and Agree with Plan of Care Patient      Patient will benefit from skilled therapeutic intervention in order to improve the following deficits and impairments:  Abnormal gait, Decreased activity tolerance, Decreased strength  Visit Diagnosis: No diagnosis found.     Problem List Patient  Active Problem List   Diagnosis Date Noted  . Normocytic anemia 04/14/2016  . Muscular deconditioning 09/17/2015  . Other specified hypothyroidism 09/09/2015  . CAP (community acquired pneumonia) 09/09/2015  . 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 Adrenal insufficiency ([place]) 09/09/2015  . Hypotension 09/09/2015  . Hyponatremia 09/09/2015  . Hypokalemia 09/09/2015    RAMSEUR,[name] , PTA  05/18/2016, 4:28 PM  [facility] [place], [place], [address] Phone: [phone]   Fax:  [phone]  Name: POSEY EDDINGER MRN: [id number] Date of Birth: [birth year]

## 2016-05-23 ENCOUNTER — Ambulatory Visit: Payer: Medicare Other | Admitting: *Deleted

## 2016-05-23 DIAGNOSIS — R2681 Unsteadiness on feet: Secondary | ICD-10-CM | POA: Diagnosis not present

## 2016-05-23 DIAGNOSIS — M6281 Muscle weakness (generalized): Secondary | ICD-10-CM

## 2016-05-24 NOTE — Therapy (Signed)
[facility] [place], [place], [address] Phone: [phone]   Fax:  [phone]  Physical Therapy Treatment  Patient Details  Name: James Hoffman MRN: [id number] Date of Birth: June 01, 1996 Referring Provider: Kenn File MD.  Encounter Date: 05/23/2016      PT End of Session - 05/23/16 1513    Visit Number 10   Number of Visits 16   Date for PT Re-Evaluation 06/03/16   PT Start Time [id number]   PT Stop Time 1609   PT Time Calculation (min) 55 min      Past Medical History  Diagnosis Date  . Thyroid disease   . 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 Adrenal insufficiency ([place])   . Hydrocephalus     Past Surgical History  Procedure Laterality Date  . Brain tumor removal  August 2012    There were no vitals filed for this visit.      Subjective Assessment - 05/23/16 1519    Subjective Doing a little better today, no headache.   Limitations Walking   Patient Stated Goals I want to get stronger and walk better.       RX;   See Flow sheet for Exs and Act.'s                            PT Short Term Goals - 04/27/16 1629    PT SHORT TERM GOAL #1   Title Ind with an initial HEP.   Time 2   Period Weeks   Status Achieved           PT Long Term Goals - 05/04/16 1850    PT LONG TERM GOAL #1   Title Ind with an advanced HEP.   Baseline No knowledge of appropriate ther ex.   Time 8   Period Weeks   Status On-going   PT LONG TERM GOAL #2   Title Increase bilateral LE strength to a solid 4+/5.   Time 8   Period Weeks   Status On-going   PT LONG TERM GOAL #3   Title Sit to stand x 5 with SBA.   Time 8   Period Weeks   Status Achieved   PT LONG TERM GOAL #4   Title Walk in clinic with CGA/SBA with a cane 500 feet.   Time 8   Period Weeks   Status On-going               Plan - 05/23/16 1519    Clinical Impression Statement Pt didn't do as well today  with Gait today due to balance deficits. He had 4 LOBs today and needed assistance to correct. I advised him to continue using walker at home. During sit to stand he only had 1 LOB and recovered on his own.  He was able to perform LE strengthening today without any increase in Knee pain.. Goals are ongoing.   Rehab Potential Good   PT Frequency 2x / week   PT Duration 8 weeks   PT Treatment/Interventions ADLs/Self Care Home Management;Therapeutic exercise;Therapeutic activities;Gait training;Neuromuscular re-education;Patient/family education   PT Next Visit Plan Nustep; bil lE strengthening; gait and balance activites.   Consulted and Agree with Plan of Care Patient      Patient will benefit from skilled therapeutic intervention in order to improve the following deficits and impairments:  Abnormal gait, Decreased activity tolerance, Decreased strength  Visit Diagnosis: Unsteadiness on feet  Muscle weakness (generalized)     Problem List Patient Active Problem List   Diagnosis Date Noted  . Normocytic anemia 04/14/2016  . Muscular deconditioning 09/17/2015  . Other specified hypothyroidism 09/09/2015  . CAP (community acquired pneumonia) 09/09/2015  . 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 Adrenal insufficiency ([place]) 09/09/2015  . Hypotension 09/09/2015  . Hyponatremia 09/09/2015  . Hypokalemia 09/09/2015    Jhanvi Drakeford,[name], PTA 05/24/2016, 10:37 AM  [facility] [place], [place], [address] Phone: [phone]   Fax:  [phone]  Name: James Hoffman MRN: [id number] Date of Birth: [birth year]

## 2016-05-25 ENCOUNTER — Ambulatory Visit: Payer: Medicare Other | Admitting: *Deleted

## 2016-05-25 DIAGNOSIS — R2681 Unsteadiness on feet: Secondary | ICD-10-CM | POA: Diagnosis not present

## 2016-05-25 DIAGNOSIS — M6281 Muscle weakness (generalized): Secondary | ICD-10-CM

## 2016-05-25 NOTE — Therapy (Signed)
[facility] [place], [place], [address] Phone: [phone]   Fax:  [phone]  Physical Therapy Treatment  Patient Details  Name: James Hoffman MRN: [id number] Date of Birth: [birth year] Referring Provider: Kenn File MD.  Encounter Date: 05/25/2016      PT End of Session - 05/25/16 1521    Visit Number 11   Number of Visits 16   Date for PT Re-Evaluation 06/03/16   PT Start Time [id number]   PT Stop Time 1606   PT Time Calculation (min) 51 min      Past Medical History  Diagnosis Date  . Thyroid disease   . 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 Adrenal insufficiency ([place])   . Hydrocephalus     Past Surgical History  Procedure Laterality Date  . Brain tumor removal  August 2012    There were no vitals filed for this visit.      Subjective Assessment - 05/25/16 1520    Subjective Feeling tired today and have had a H/A. I have been sleeping a lot today   Limitations Walking   Patient Stated Goals I want to get stronger and walk better.   Currently in Pain? No/denies                         [facility] Adult PT Treatment/Exercise - 05/25/16 0001    Ambulation/Gait   Ambulation Distance (Feet) 125 Feet   Assistive device Large base quad cane   Gait Pattern Decreased arm swing - left;Decreased step length - right;Decreased step length - left;Scissoring;Ataxic   Ambulation Surface Level   Gait Comments Gait training with wide based QC.  Patient not doing as well again with gait due to some dizziness and a head achetoday with 4 LOB  and needed CG assistance . Marland Kitchen  For the majority of his gait training he required CG assist   Exercises   Exercises Knee/Hip   Knee/Hip Exercises: Aerobic   Nustep Level 5 x 15 minutes. UE and LE activity   Knee/Hip Exercises: Machines for Strengthening   Cybex Knee Extension 20# 3x10   Cybex Knee Flexion 30#s 3x10   Knee/Hip Exercises: Seated   Long Arc  Quad Strengthening;3 sets;10 reps;Right;Left  5# LT, 71/2 # RT work on eccentric control   Knee/Hip Exercises: Supine   Bridges with Clamshell Strengthening;Both;3 sets;10 reps  Red T-band hold for 3-5 secs   Straight Leg Raises Strengthening;Both;3 sets;10 reps                  PT Short Term Goals - 04/27/16 1629    PT SHORT TERM GOAL #1   Title Ind with an initial HEP.   Time 2   Period Weeks   Status Achieved           PT Long Term Goals - 05/04/16 1850    PT LONG TERM GOAL #1   Title Ind with an advanced HEP.   Baseline No knowledge of appropriate ther ex.   Time 8   Period Weeks   Status On-going   PT LONG TERM GOAL #2   Title Increase bilateral LE strength to a solid 4+/5.   Time 8   Period Weeks   Status On-going   PT LONG TERM GOAL #3   Title Sit to stand x 5 with SBA.   Time 8   Period Weeks   Status Achieved  PT LONG TERM GOAL #4   Title Walk in clinic with CGA/SBA with a cane 500 feet.   Time 8   Period Weeks   Status On-going               Plan - 05/25/16 1523    Clinical Impression Statement Pt did fairly well in strengthening  exs and Act.'s today, but struggled in Gait training again with LOB x 4 times with assistance needed to regain. Decreased balance exs today due to Pt not feeling good with a  H/A and some dizziness.   Rehab Potential Good   PT Frequency 2x / week   PT Duration 8 weeks   PT Treatment/Interventions ADLs/Self Care Home Management;Therapeutic exercise;Therapeutic activities;Gait training;Neuromuscular re-education;Patient/family education   PT Next Visit Plan Nustep; bil lE strengthening; gait and balance activites.   Consulted and Agree with Plan of Care Patient      Patient will benefit from skilled therapeutic intervention in order to improve the following deficits and impairments:  Abnormal gait, Decreased activity tolerance, Decreased strength  Visit Diagnosis: Unsteadiness on feet  Muscle weakness  (generalized)     Problem List Patient Active Problem List   Diagnosis Date Noted  . Normocytic anemia 04/14/2016  . Muscular deconditioning 09/17/2015  . Other specified hypothyroidism 09/09/2015  . CAP (community acquired pneumonia) 09/09/2015  . 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 Adrenal insufficiency ([place]) 09/09/2015  . Hypotension 09/09/2015  . Hyponatremia 09/09/2015  . Hypokalemia 09/09/2015    James Hoffman,James Hoffman, James Hoffman 05/25/2016, 5:30 PM  [facility] [place], [place], [address] Phone: [phone]   Fax:  [phone]  Name: James Hoffman MRN: [id number] Date of Birth: [birth year]

## 2016-05-30 ENCOUNTER — Encounter: Payer: Medicare Other | Admitting: *Deleted

## 2016-05-31 ENCOUNTER — Encounter: Payer: Self-pay | Admitting: "Endocrinology

## 2016-05-31 ENCOUNTER — Ambulatory Visit (INDEPENDENT_AMBULATORY_CARE_PROVIDER_SITE_OTHER): Payer: Medicare Other | Admitting: "Endocrinology

## 2016-05-31 ENCOUNTER — Other Ambulatory Visit: Payer: Self-pay | Admitting: *Deleted

## 2016-05-31 VITALS — BP 112/74 | HR 84 | Ht 70.0 in | Wt 118.0 lb

## 2016-05-31 DIAGNOSIS — E038 Other specified hypothyroidism: Secondary | ICD-10-CM

## 2016-05-31 DIAGNOSIS — E2740 Unspecified adrenocortical insufficiency: Secondary | ICD-10-CM

## 2016-05-31 MED ORDER — PREDNISONE 5 MG PO TABS: 5.0000 mg | ORAL_TABLET | Freq: Every day | ORAL | Status: DC

## 2016-05-31 MED ORDER — PREDNISONE 10 MG PO TABS: 10.0000 mg | ORAL_TABLET | Freq: Every day | ORAL | Status: DC

## 2016-05-31 NOTE — Telephone Encounter (Signed)
James Hoffman 6/17 Prednisone rf requested

## 2016-05-31 NOTE — Progress Notes (Signed)
Subjective:    Patient ID: James Hoffman, male    DOB: 1996-01-27, PCP Kenn File, MD   Past Medical History  Diagnosis Date  . Thyroid disease   . 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 Adrenal insufficiency ([place])   . Hydrocephalus    Past Surgical History  Procedure Laterality Date  . Brain tumor removal  August 2012   Social History   Social History  . Marital Status: Single    Spouse Name: N/A  . Number of Children: N/A  . Years of Education: N/A   Social History Main Topics  . Smoking status: Current Every Day Smoker -- 0.50 packs/day    Types: Cigarettes    Start date: 11/25/2012  . Smokeless tobacco: None  . Alcohol Use: No  . Drug Use: No  . Sexual Activity: Not Asked   Other Topics Concern  . None   Social History Narrative   Outpatient Encounter Prescriptions as of 05/31/2016  Medication Sig  . levothyroxine (SYNTHROID, LEVOTHROID) 88 MCG tablet Take 1 tablet (88 mcg total) by mouth daily before breakfast.  . predniSONE (DELTASONE) 10 MG tablet Take 1 tablet (10 mg total) by mouth daily with breakfast.  . [DISCONTINUED] predniSONE (DELTASONE) 5 MG tablet Take 1 tablet (5 mg total) by mouth daily with breakfast.   No facility-administered encounter medications on file as of 05/31/2016.   ALLERGIES: No Known Allergies VACCINATION STATUS:  There is no immunization history on file for this patient.  HPI  20 year old male patient with medical history as above. He is being seen in consultation for history of adrenal insufficiency and hypothyroidism requested by Dr. Wendi Snipes. -He has a long and complicated medical history. 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  -His surgery and treatment for medulloblastoma took place at [facility] in [place]. On subsequent years he was diagnosed with hypothyroidism and adrenal insufficiency. He is currently on prednisone 5 mg  by mouth daily and levothyroxine 88 g by mouth every morning. - He continues to have ataxia , following up with outpatient physical therapy. He has significant disequilibrium and on and off dizziness and lightheadedness.  He has both motor and sensory aphasia. His grandmother and sister are helping eliciting history. -Reportedly, he never grew any mustache nor beard and never had to shave. -He has never engaged in sexual activity her family's history.   Review of Systems Constitutional:  Has always been light build, + fatigue, no subjective hyperthermia/hypothermia - He walks with a special walker. Eyes: no blurry vision, no xerophthalmia ENT: no sore throat, no nodules palpated in throat, no dysphagia/odynophagia, no hoarseness Cardiovascular: no CP/SOB/palpitations/leg swelling Respiratory: no cough/SOB Gastrointestinal: no N/V/D/C Musculoskeletal: no muscle/joint aches Skin: no rashes Neurological: no tremors, + complains of numbness of left upper extremity associated with tingling. Marland Kitchen He has on and off lightheadedness and dizziness. Psychiatric: no depression/anxiety  Objective:    BP 112/74 mmHg  Pulse 84  Ht 5\' 10"  (1.778 m)  Wt 118 lb (53.524 kg)  BMI 16.93 kg/m2  Wt Readings from Last 3 Encounters:  05/31/16 118 lb (53.524 kg)  09/17/15 116 lb 9.6 oz (52.889 kg) (2 %*, Z = -2.01)  09/09/15 107 lb 3.2 oz (48.626 kg) (0 %*, Z = -2.73)   * Growth percentiles are based on CDC 2-20 Years data.    Physical Exam  Constitutional: Light build, chronically sick-looking in NAD, he ambulates  with a special walker. Eyes: PERRLA, EOMI, no exophthalmos ENT: Poor dentition, healed post craniotomy scar on the back of his head, moist mucous membranes, no thyromegaly, no cervical lymphadenopathy Cardiovascular: RRR, No MRG Respiratory: CTA B Gastrointestinal: abdomen soft, NT, ND, BS+ Musculoskeletal: Global loss of skeletal muscles, no deformities, strength 3 out of 5 in all 4  extremities. Skin: moist, warm, no rashes, has poor skin turgor  Neurological: no tremor with outstretched hands, DTR normal in all 4  CMP ( most recent) CMP     Component Value Date/Time   NA 134 04/14/2016 1659   NA 135 09/10/2015 0533   K 4.1 04/14/2016 1659   CL 91* 04/14/2016 1659   CO2 23 04/14/2016 1659   GLUCOSE 75 04/14/2016 1659   GLUCOSE 96 09/10/2015 0533   BUN 12 04/14/2016 1659   BUN <5* 09/10/2015 0533   CREATININE 0.79 04/14/2016 1659   CALCIUM 9.0 04/14/2016 1659   PROT 6.1 04/14/2016 1659   PROT 6.8 09/09/2015 1542   ALBUMIN 4.1 04/14/2016 1659   ALBUMIN 3.6 09/09/2015 1542   AST 12 04/14/2016 1659   ALT 6 04/14/2016 1659   ALKPHOS 72 04/14/2016 1659   BILITOT <0.2 04/14/2016 1659   BILITOT 0.6 09/09/2015 1542   GFRNONAA 129 04/14/2016 1659   GFRAA 149 04/14/2016 1659     Assessment & Plan:   1. Adrenal insufficiency ([place]) - He seems to have well settled diagnosis of secondary adrenal insufficiency likely other result of surgery and radiation to his head associated with his history of medulloblastoma of the brainstem. The details of his cancer therapy are not available to review today. -He will benefit from increased steroid support. I would increase his prednisone to 10 mg by mouth every morning. I advised him on sick day rules where he can double his prednisone until the stressors are resolved. - This patient needs screening for other endocrine deficiencies including hypogonadism. I will obtain CMP, FSH/LH, total testosterone on the morning something before his next visit. - He will also need DEXA scan on a later date.   2. Other specified hypothyroidism - This also seems to be a well settled diagnosis for him. I discussed the need for levothyroxine replacement for life with him. I will continue current dose of levothyroxine at 88 g by mouth every morning.  - We discussed about correct intake of levothyroxine, at fasting, with water, separated by at  least 30 minutes from breakfast, and separated by more than 4 hours from calcium, iron, multivitamins, acid reflux medications (PPIs). -Patient is made aware of the fact that thyroid hormone replacement is needed for life, dose to be adjusted by periodic monitoring of thyroid function tests. - I will try to obtain and review his history from [facility] from [place].  -I advised him to continue physical therapy as an outpatient.  -I have extensively counseled him against smoking.  -He will need for medical alert device to wear on his body depicting his history is specially of the need for steroids.  - I advised patient to maintain close follow up with Kenn File, MD for primary care needs. Follow up plan: Return in about 4 weeks (around 06/28/2016) for follow up with pre-visit labs.  Glade Lloyd, MD Phone: [phone]  Fax: [phone]   05/31/2016, 5:17 PM

## 2016-06-01 ENCOUNTER — Ambulatory Visit: Payer: Medicare Other | Admitting: *Deleted

## 2016-06-01 DIAGNOSIS — M6281 Muscle weakness (generalized): Secondary | ICD-10-CM | POA: Diagnosis not present

## 2016-06-01 DIAGNOSIS — R2681 Unsteadiness on feet: Secondary | ICD-10-CM

## 2016-06-01 NOTE — Therapy (Signed)
[facility] [place], [place], [address] Phone: [phone]   Fax:  [phone]  Physical Therapy Treatment  Patient Details  Name: James Hoffman MRN: [id number] Date of Birth: 27-May-1996 Referring Provider: Kenn File MD.  Encounter Date: 06/01/2016      PT End of Session - 06/01/16 1556    Visit Number 12   Number of Visits 16   Date for PT Re-Evaluation 06/03/16   PT Start Time [id number]   PT Stop Time 1605   PT Time Calculation (min) 50 min      Past Medical History  Diagnosis Date  . Thyroid disease   . 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 Adrenal insufficiency ([place])   . Hydrocephalus     Past Surgical History  Procedure Laterality Date  . Brain tumor removal  August 2012    There were no vitals filed for this visit.      Subjective Assessment - 06/01/16 1555    Subjective Feeling hot today.    Limitations Walking   Patient Stated Goals I want to get stronger and walk better.                         [facility] Adult PT Treatment/Exercise - 06/01/16 0001    Ambulation/Gait   Ambulation Distance (Feet) 165 Feet   Assistive device Large base quad cane   Gait Pattern Decreased arm swing - left;Decreased step length - right;Decreased step length - left;Scissoring;Ataxic   Ambulation Surface Level   Gait Comments Gait training with wide based QC.  Patient did better today with only 2 LOB CG assistance . Marland Kitchen    Exercises   Exercises Knee/Hip   Knee/Hip Exercises: Aerobic   Nustep Level 5 x 66minutes. UE and LE activity   Knee/Hip Exercises: Machines for Strengthening   Other Machine Leg press 1pl 4x10 with focus on technique  cues for Jt alignment   Knee/Hip Exercises: Seated   Long Arc Quad Strengthening;3 sets;10 reps;Right;Left  5# LT, 71/2 # RT work on eccentric control   Sit to [employer] 10 reps;with UE support  CGA for balance.  LOB x 2 with assistance to regain bal.                   PT Short Term Goals - 04/27/16 1629    PT SHORT TERM GOAL #1   Title Ind with an initial HEP.   Time 2   Period Weeks   Status Achieved           PT Long Term Goals - 05/04/16 1850    PT LONG TERM GOAL #1   Title Ind with an advanced HEP.   Baseline No knowledge of appropriate ther ex.   Time 8   Period Weeks   Status On-going   PT LONG TERM GOAL #2   Title Increase bilateral LE strength to a solid 4+/5.   Time 8   Period Weeks   Status On-going   PT LONG TERM GOAL #3   Title Sit to stand x 5 with SBA.   Time 8   Period Weeks   Status Achieved   PT LONG TERM GOAL #4   Title Walk in clinic with CGA/SBA with a cane 500 feet.   Time 8   Period Weeks   Status On-going  Plan - 06/01/16 1752    Clinical Impression Statement Pt was doing better today and was able to ambulate better today with decreased LOB today with only 2. He still needed assistance to regain his balance. Pt did better with sit to stand today with LOB x2. We tried the leg press  today and he was able to do well, but needed  cues for jt alignment..  Goals are on-going   Rehab Potential Good   PT Frequency 2x / week   PT Duration 8 weeks   PT Treatment/Interventions ADLs/Self Care Home Management;Therapeutic exercise;Therapeutic activities;Gait training;Neuromuscular re-education;Patient/family education   PT Next Visit Plan Nustep; bil lE strengthening; gait and balance activites.   Consulted and Agree with Plan of Care Patient      Patient will benefit from skilled therapeutic intervention in order to improve the following deficits and impairments:  Abnormal gait, Decreased activity tolerance, Decreased strength  Visit Diagnosis: Unsteadiness on feet  Muscle weakness (generalized)     Problem List Patient Active Problem List   Diagnosis Date Noted  . Normocytic anemia 04/14/2016  . Muscular deconditioning 09/17/2015  . Other specified  hypothyroidism 09/09/2015  . CAP (community acquired pneumonia) 09/09/2015  . 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 Adrenal insufficiency ([place]) 09/09/2015  . Hypotension 09/09/2015  . Hyponatremia 09/09/2015  . Hypokalemia 09/09/2015    Dorrance Sellick,[name], PTA 06/01/2016, 6:16 PM  [facility] [place], [place], [address] Phone: [phone]   Fax:  [phone]  Name: James Hoffman MRN: [id number] Date of Birth: Oct 04, 1996

## 2016-06-06 ENCOUNTER — Ambulatory Visit: Payer: Medicare Other | Admitting: *Deleted

## 2016-06-06 DIAGNOSIS — M6281 Muscle weakness (generalized): Secondary | ICD-10-CM | POA: Diagnosis not present

## 2016-06-06 DIAGNOSIS — R2681 Unsteadiness on feet: Secondary | ICD-10-CM | POA: Diagnosis not present

## 2016-06-06 NOTE — Therapy (Signed)
[facility] [place], [place], [address] Phone: [phone]   Fax:  [phone]  Physical Therapy Treatment  Patient Details  Name: KENDALL DEMAREST MRN: [id number] Date of Birth: Jan 31, 1996 Referring Provider: Kenn File MD.  Encounter Date: 06/06/2016      PT End of Session - 06/06/16 1813    Visit Number 13   Number of Visits 16   Date for PT Re-Evaluation 06/03/16   PT Start Time [id number]   PT Stop Time [id number]   PT Time Calculation (min) 51 min      Past Medical History:  Diagnosis Date  . Adrenal insufficiency ([place])   . 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 Hydrocephalus   . Thyroid disease     Past Surgical History:  Procedure Laterality Date  . brain tumor removal  August 2012    There were no vitals filed for this visit.      Subjective Assessment - 06/06/16 1536    Subjective Feeling hot today.  No pain today   Limitations Walking   Patient Stated Goals I want to get stronger and walk better.                         [facility] Adult PT Treatment/Exercise - 06/06/16 0001      Ambulation/Gait   Ambulation Distance (Feet) 165 Feet   Assistive device Large base quad cane   Gait Pattern Decreased arm swing - left;Decreased step length - right;Decreased step length - left;Scissoring;Ataxic   Ambulation Surface Level   Gait Comments Gait training with wide based QC.  Patient did better today with only 2 LOB CG assistance . Marland Kitchen      Exercises   Exercises Knee/Hip     Knee/Hip Exercises: Aerobic   Nustep Level 5 x 48minutes. UE and LE activity     Knee/Hip Exercises: Machines for Strengthening   Cybex Knee Extension 20# 3x10   Cybex Knee Flexion 30#s 3x10   Other Machine Leg press 1pl 4x10 with focus on technique  cues for Jt alignment          Sit to stand with UE Assistance  x 10 with CGA/SBA              PT Short Term Goals - 04/27/16 1629      PT SHORT TERM  GOAL #1   Title Ind with an initial HEP.   Time 2   Period Weeks   Status Achieved           PT Long Term Goals - 05/04/16 1850      PT LONG TERM GOAL #1   Title Ind with an advanced HEP.   Baseline No knowledge of appropriate ther ex.   Time 8   Period Weeks   Status On-going     PT LONG TERM GOAL #2   Title Increase bilateral LE strength to a solid 4+/5.   Time 8   Period Weeks   Status On-going     PT LONG TERM GOAL #3   Title Sit to stand x 5 with SBA.   Time 8   Period Weeks   Status Achieved     PT LONG TERM GOAL #4   Title Walk in clinic with CGA/SBA with a cane 500 feet.   Time 8   Period Weeks   Status On-going  Plan - 06/06/16 1814    Clinical Impression Statement Pt did fairly well during RX today, but did get dizzy during sit to stand exercise and didn't want to do anymore balance exs today. He did well with gait today with only 2 LOB with LBQC, but still needed assistance to recover. Goals are ongoing due to balance  and gait deficits    Rehab Potential Good   PT Frequency 2x / week   PT Duration 8 weeks   PT Treatment/Interventions ADLs/Self Care Home Management;Therapeutic exercise;Therapeutic activities;Gait training;Neuromuscular re-education;Patient/family education   PT Next Visit Plan Nustep; bil lE strengthening; gait and balance activites.   Consulted and Agree with Plan of Care Patient      Patient will benefit from skilled therapeutic intervention in order to improve the following deficits and impairments:  Abnormal gait, Decreased activity tolerance, Decreased strength  Visit Diagnosis: Unsteadiness on feet  Muscle weakness (generalized)     Problem List Patient Active Problem List   Diagnosis Date Noted  . Normocytic anemia 04/14/2016  . Muscular deconditioning 09/17/2015  . Other specified hypothyroidism 09/09/2015  . CAP (community acquired pneumonia) 09/09/2015  . 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 Adrenal insufficiency ([place]) 09/09/2015  . Hypotension 09/09/2015  . Hyponatremia 09/09/2015  . Hypokalemia 09/09/2015    RAMSEUR,[name], PTA 06/06/2016, 6:18 PM  [facility] [place], [place], [address] Phone: [phone]   Fax:  [phone]  Name: LYNWARD BOURBON MRN: [id number] Date of Birth: [birth year]

## 2016-06-08 ENCOUNTER — Ambulatory Visit: Payer: Medicare Other | Admitting: *Deleted

## 2016-06-08 DIAGNOSIS — M6281 Muscle weakness (generalized): Secondary | ICD-10-CM | POA: Diagnosis not present

## 2016-06-08 DIAGNOSIS — R2681 Unsteadiness on feet: Secondary | ICD-10-CM

## 2016-06-08 NOTE — Therapy (Signed)
[place], [place], [address] Phone: [phone]   Fax:  [phone]  Physical Therapy Treatment  Patient Details  Name: James Hoffman MRN: [id number] Date of Birth: 1996-08-14 Referring Provider: Kenn File MD.  Encounter Date: 06/08/2016      PT End of Session - 06/08/16 1756    Visit Number 14   Number of Visits 16   Date for PT Re-Evaluation 06/03/16   PT Start Time [id number]   PT Stop Time 1605   PT Time Calculation (min) 50 min      Past Medical History:  Diagnosis Date  . Adrenal insufficiency ([place])   . 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 Hydrocephalus   . Thyroid disease     Past Surgical History:  Procedure Laterality Date  . brain tumor removal  August 2012    There were no vitals filed for this visit.      Subjective Assessment - 06/08/16 1532    Subjective Did good after last Rx. I like the machines   Limitations Walking   Patient Stated Goals I want to get stronger and walk better.   Currently in Pain? No/denies                         [facility] Adult PT Treatment/Exercise - 06/08/16 0001      Ambulation/Gait   Ambulation Distance (Feet) 125 Feet   Assistive device Large base quad cane   Gait Pattern Decreased arm swing - left;Decreased step length - right;Decreased step length - left;Scissoring;Ataxic   Ambulation Surface Level   Gait Comments Gait training with wide based QC.  Patient did better today with only 1 LOB CG assistance  self corrected     Exercises   Exercises Knee/Hip     Knee/Hip Exercises: Aerobic   Nustep Level 5 x 15 minutes. UE and LE activity, 1867 steps     Knee/Hip Exercises: Machines for Strengthening   Cybex Knee Extension 20# 3x10   Cybex Knee Flexion 30#s 3x10   Other Machine Leg press 2pl 4x10 with focus on technique     Knee/Hip Exercises: Seated   Sit to Sand 10 reps;with UE support  CGA for balance.  LOB x 2   but was able to regain bal. Ind.                  PT Short Term Goals - 04/27/16 1629      PT SHORT TERM GOAL #1   Title Ind with an initial HEP.   Time 2   Period Weeks   Status Achieved           PT Long Term Goals - 05/04/16 1850      PT LONG TERM GOAL #1   Title Ind with an advanced HEP.   Baseline No knowledge of appropriate ther ex.   Time 8   Period Weeks   Status On-going     PT LONG TERM GOAL #2   Title Increase bilateral LE strength to a solid 4+/5.   Time 8   Period Weeks   Status On-going     PT LONG TERM GOAL #3   Title Sit to stand x 5 with SBA.   Time 8   Period Weeks   Status Achieved     PT LONG TERM GOAL #4   Title Walk in clinic with CGA/SBA with a cane 500  feet.   Time 8   Period Weeks   Status On-going               Plan - 06/08/16 1757    Clinical Impression Statement Pt was fatigued upon arrival to clinic, but actually performed better in gait and balance act.'s with self recovery after LOB. He said he was able to walk at home in the hallway yesterday with no A/D using the walls for assistance. LTGs are still ongoing due to balance deficits    Rehab Potential Good   PT Frequency 2x / week   PT Duration 8 weeks   PT Treatment/Interventions ADLs/Self Care Home Management;Therapeutic exercise;Therapeutic activities;Gait training;Neuromuscular re-education;Patient/family education   PT Next Visit Plan Nustep; bil lE strengthening; gait and balance activites.   Consulted and Agree with Plan of Care Patient      Patient will benefit from skilled therapeutic intervention in order to improve the following deficits and impairments:  Abnormal gait, Decreased activity tolerance, Decreased strength  Visit Diagnosis: Unsteadiness on feet  Muscle weakness (generalized)     Problem List Patient Active Problem List   Diagnosis Date Noted  . Normocytic anemia 04/14/2016  . Muscular deconditioning 09/17/2015  . Other  specified hypothyroidism 09/09/2015  . CAP (community acquired pneumonia) 09/09/2015  . Medullary carcinoma ([place]) 09/09/2015  . Adrenal insufficiency ([place]) 09/09/2015  . Hypotension 09/09/2015  . Hyponatremia 09/09/2015  . Hypokalemia 09/09/2015    Zakaria Sedor,[name], PTA 06/08/2016, 6:08 PM  [facility] [place], [place], [address] Phone: [phone]   Fax:  [phone]  Name: James Hoffman MRN: [id number] Date of Birth: 01/17/1996

## 2016-06-13 ENCOUNTER — Ambulatory Visit: Payer: Medicare Other | Attending: Family Medicine | Admitting: Physical Therapy

## 2016-06-13 DIAGNOSIS — R260 Ataxic gait: Secondary | ICD-10-CM | POA: Insufficient documentation

## 2016-06-13 DIAGNOSIS — R2681 Unsteadiness on feet: Secondary | ICD-10-CM | POA: Insufficient documentation

## 2016-06-13 DIAGNOSIS — M6281 Muscle weakness (generalized): Secondary | ICD-10-CM | POA: Insufficient documentation

## 2016-06-13 NOTE — Therapy (Signed)
[facility] [place], [place], [address] Phone: [phone]   Fax:  [phone]  Physical Therapy Treatment  Patient Details  Name: James Hoffman MRN: [id number] Date of Birth: Mar 02, 1996 Referring Provider: Kenn File MD.  Encounter Date: 06/13/2016      PT End of Session - 06/13/16 1540    Visit Number 15   Number of Visits 16   Date for PT Re-Evaluation 06/03/16   PT Start Time 0320  Patient delayed start of his treatment at he was conversing up front.   PT Stop Time 0406   PT Time Calculation (min) 46 min   Activity Tolerance Patient tolerated treatment well   Behavior During Therapy WFL for tasks assessed/performed      Past Medical History:  Diagnosis Date  . Adrenal insufficiency ([place])   . 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 Hydrocephalus   . Thyroid disease     Past Surgical History:  Procedure Laterality Date  . brain tumor removal  August 2012    There were no vitals filed for this visit.      Subjective Assessment - 06/13/16 1544    Subjective I like working out on the machines.   Patient Stated Goals I want to get stronger and walk better.     Treatment:  Nustep level 4 x 15 minutes f/b 20# knee extension x 3 minutes f/b ham curls 30# x 3 minutes and leg press with 2 plates x 3 minutes f/b Green theraband bilateral hip abduction x 3 minutes.  Gait training with patient with a wide base cane cane (11 minutes total) with multiple stops for balance checks.  Excellent job today.                              PT Short Term Goals - 04/27/16 1629      PT SHORT TERM GOAL #1   Title Ind with an initial HEP.   Time 2   Period Weeks   Status Achieved           PT Long Term Goals - 05/04/16 1850      PT LONG TERM GOAL #1   Title Ind with an advanced HEP.   Baseline No knowledge of appropriate ther ex.   Time 8   Period Weeks   Status On-going      PT LONG TERM GOAL #2   Title Increase bilateral LE strength to a solid 4+/5.   Time 8   Period Weeks   Status On-going     PT LONG TERM GOAL #3   Title Sit to stand x 5 with SBA.   Time 8   Period Weeks   Status Achieved     PT LONG TERM GOAL #4   Title Walk in clinic with CGA/SBA with a cane 500 feet.   Time 8   Period Weeks   Status On-going             Patient will benefit from skilled therapeutic intervention in order to improve the following deficits and impairments:     Visit Diagnosis: Unsteadiness on feet  Muscle weakness (generalized)     Problem List Patient Active Problem List   Diagnosis Date Noted  . Normocytic anemia 04/14/2016  . Muscular deconditioning 09/17/2015  . Other specified hypothyroidism 09/09/2015  . CAP (community acquired pneumonia) 09/09/2015  . Medullary carcinoma ([place])  09/09/2015  . Adrenal insufficiency ([place]) 09/09/2015  . Hypotension 09/09/2015  . Hyponatremia 09/09/2015  . Hypokalemia 09/09/2015    Vernon Ariel, [place] MPT 06/13/2016, 4:08 PM  [facility] [address] [place], [place], [address] Phone: [phone]   Fax:  [phone]  Name: James Hoffman MRN: [id number] Date of Birth: 1996/08/23

## 2016-06-15 ENCOUNTER — Encounter: Payer: Medicare Other | Admitting: Physical Therapy

## 2016-06-20 ENCOUNTER — Encounter: Payer: Medicare Other | Admitting: *Deleted

## 2016-06-22 ENCOUNTER — Encounter: Payer: Medicare Other | Admitting: *Deleted

## 2016-06-28 ENCOUNTER — Other Ambulatory Visit: Payer: Self-pay | Admitting: Family Medicine

## 2016-06-28 ENCOUNTER — Encounter: Payer: Self-pay | Admitting: Family Medicine

## 2016-06-28 ENCOUNTER — Ambulatory Visit (INDEPENDENT_AMBULATORY_CARE_PROVIDER_SITE_OTHER): Payer: Medicare Other | Admitting: Family Medicine

## 2016-06-28 VITALS — BP 111/74 | HR 81 | Temp 96.9°F | Ht 70.0 in | Wt 118.0 lb

## 2016-06-28 DIAGNOSIS — E2740 Unspecified adrenocortical insufficiency: Secondary | ICD-10-CM

## 2016-06-28 DIAGNOSIS — Z13828 Encounter for screening for other musculoskeletal disorder: Secondary | ICD-10-CM

## 2016-06-28 DIAGNOSIS — Z9189 Other specified personal risk factors, not elsewhere classified: Secondary | ICD-10-CM

## 2016-06-28 DIAGNOSIS — J449 Chronic obstructive pulmonary disease, unspecified: Secondary | ICD-10-CM

## 2016-06-28 NOTE — Patient Instructions (Signed)
Great to see you!  We will look into how to get a Dexa scan arranged (covred by insurance)  Lets see him again in 4 months

## 2016-06-28 NOTE — Progress Notes (Signed)
   HPI  Patient presents today for follow-up for adrenal insufficiency and routine follow-up.  Patient is doing very well, he is enjoying physical therapy but has not been able to go routinely over the last week due to his father's recent bowel perforation and surgery.  He and his mother enjoyed their visit with her new endocrinologist.   He has no complaints and does not need refills today.  PMH: Smoking status noted ROS: Per HPI  Objective: BP 111/74 (BP Location: Right Arm, Patient Position: Sitting, Cuff Size: Normal)   Pulse 81   Temp (!) 96.9 F (36.1 C) (Oral)   Ht 5\' 10"  (1.778 m)   Wt 118 lb (53.5 kg)   BMI 16.93 kg/m  Gen: NAD, alert, cooperative with exam HEENT: NCAT CV: RRR, good S1/S2, no murmur Resp: CTABL, no wheezes, non-labored Abd: SNTND, BS present, no guarding or organomegaly Ext: No edema, warm Neuro: Alert and oriented, No gross deficits  Assessment and plan:  # Adrenal insufficiency Doing well on prednisone Appreciate endocrinology's management Patient and family are preparing for previsit labs We have arranged for DEXA scan here, as he is at increased risk for osteopenia/osteoporosis   Follow-up every 4 months for now.   Laroy Apple, MD [facility] 06/28/2016, 5:07 PM

## 2016-07-04 ENCOUNTER — Encounter: Payer: Medicare Other | Admitting: *Deleted

## 2016-07-06 ENCOUNTER — Ambulatory Visit: Payer: Medicare Other | Admitting: Physical Therapy

## 2016-07-06 DIAGNOSIS — M6281 Muscle weakness (generalized): Secondary | ICD-10-CM | POA: Diagnosis not present

## 2016-07-06 DIAGNOSIS — R2681 Unsteadiness on feet: Secondary | ICD-10-CM | POA: Diagnosis not present

## 2016-07-06 DIAGNOSIS — R260 Ataxic gait: Secondary | ICD-10-CM | POA: Diagnosis not present

## 2016-07-06 NOTE — Therapy (Signed)
[facility] [place], [place], [address] Phone: [phone]   Fax:  [phone]  Physical Therapy Treatment  Patient Details  Name: CAROLS KINTIGH MRN: [id number] Date of Birth: 05-09-1996 Referring Provider: Kenn File MD.  Encounter Date: 07/06/2016      PT End of Session - 07/06/16 1535    Visit Number 16   Number of Visits 28   Date for PT Re-Evaluation 07/29/16   PT Start Time 0327  Late arrival.   PT Stop Time 0403   PT Time Calculation (min) 36 min      Past Medical History:  Diagnosis Date  . Adrenal insufficiency ([place])   . 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 Hydrocephalus   . Thyroid disease     Past Surgical History:  Procedure Laterality Date  . brain tumor removal  August 2012    There were no vitals filed for this visit.                       [facility] Adult PT Treatment/Exercise - 07/06/16 0001      Ambulation/Gait   Assistive device Large base quad cane   Ambulation Surface Level   Gait Comments Gait training with a large base quad cane 130 feet x 2.     Exercises   Exercises Knee/Hip     Knee/Hip Exercises: Aerobic   Nustep Level 5 x 15 minutes.                  PT Short Term Goals - 04/27/16 1629      PT SHORT TERM GOAL #1   Title Ind with an initial HEP.   Time 2   Period Weeks   Status Achieved           PT Long Term Goals - 05/04/16 1850      PT LONG TERM GOAL #1   Title Ind with an advanced HEP.   Baseline No knowledge of appropriate ther ex.   Time 8   Period Weeks   Status On-going     PT LONG TERM GOAL #2   Title Increase bilateral LE strength to a solid 4+/5.   Time 8   Period Weeks   Status On-going     PT LONG TERM GOAL #3   Title Sit to stand x 5 with SBA.   Time 8   Period Weeks   Status Achieved     PT LONG TERM GOAL #4   Title Walk in clinic with CGA/SBA with a cane 500 feet.   Time 8   Period  Weeks   Status On-going               Plan - 07/06/16 1537    Clinical Impression Statement Patient tired upon arrival clinic today.  Nonetheless he has overall shown improvement with regards to balance and gait activites. Goals are ongoing.   Rehab Potential Good   PT Frequency 2x / week   PT Duration 8 weeks      Patient will benefit from skilled therapeutic intervention in order to improve the following deficits and impairments:  Abnormal gait, Decreased activity tolerance, Decreased strength  Visit Diagnosis: Ataxic gait - Plan: PT plan of care cert/re-cert, PT plan of care cert/re-cert  Unsteadiness on feet     Problem List Patient Active Problem List   Diagnosis Date Noted  . Normocytic anemia 04/14/2016  .  Muscular deconditioning 09/17/2015  . Other specified hypothyroidism 09/09/2015  . Medullary carcinoma ([place]) 09/09/2015  . Adrenal insufficiency ([place]) 09/09/2015  . Hypotension 09/09/2015  . Hyponatremia 09/09/2015  . Hypokalemia 09/09/2015    Jizelle Conkey, [place] MPT 07/06/2016, 4:13 PM  [facility] [address] [place], [place], [address] Phone: [phone]   Fax:  [phone]  Name: TRELON BLISH MRN: [id number] Date of Birth: 07/27/1996

## 2016-07-11 ENCOUNTER — Encounter: Payer: Medicare Other | Admitting: Physical Therapy

## 2016-07-12 ENCOUNTER — Other Ambulatory Visit: Payer: Medicare Other

## 2016-07-12 DIAGNOSIS — E2740 Unspecified adrenocortical insufficiency: Secondary | ICD-10-CM | POA: Diagnosis not present

## 2016-07-12 DIAGNOSIS — E038 Other specified hypothyroidism: Secondary | ICD-10-CM | POA: Diagnosis not present

## 2016-07-13 ENCOUNTER — Encounter: Payer: Medicare Other | Admitting: *Deleted

## 2016-07-13 LAB — CMP14+EGFR
ALBUMIN: 4.3 g/dL (ref 3.5–5.5)
ALT: 9 IU/L (ref 0–44)
AST: 12 IU/L (ref 0–40)
Albumin/Globulin Ratio: 2.3 — ABNORMAL HIGH (ref 1.2–2.2)
Alkaline Phosphatase: 65 IU/L (ref 39–117)
BUN / CREAT RATIO: 9 (ref 9–20)
BUN: 8 mg/dL (ref 6–20)
Bilirubin Total: 0.3 mg/dL (ref 0.0–1.2)
CALCIUM: 10 mg/dL (ref 8.7–10.2)
CO2: 26 mmol/L (ref 18–29)
CREATININE: 0.88 mg/dL (ref 0.76–1.27)
Chloride: 95 mmol/L — ABNORMAL LOW (ref 96–106)
GFR, EST AFRICAN AMERICAN: 143 mL/min/{1.73_m2} (ref >59)
GFR, EST NON AFRICAN AMERICAN: 124 mL/min/{1.73_m2} (ref >59)
GLOBULIN, TOTAL: 1.9 g/dL (ref 1.5–4.5)
Glucose: 101 mg/dL — ABNORMAL HIGH (ref 65–99)
Potassium: 3.4 mmol/L — ABNORMAL LOW (ref 3.5–5.2)
SODIUM: 139 mmol/L (ref 134–144)
TOTAL PROTEIN: 6.2 g/dL (ref 6.0–8.5)

## 2016-07-13 LAB — TSH: TSH: 0.618 u[IU]/mL (ref 0.450–4.500)

## 2016-07-13 LAB — FSH/LH
FSH: 7.8 m[IU]/mL (ref 1.5–12.4)
LH: 2.5 m[IU]/mL (ref 1.7–8.6)

## 2016-07-13 LAB — TESTOSTERONE, FREE, TOTAL, SHBG
SEX HORMONE BINDING: 125 nmol/L — AB (ref 16.5–55.9)
Testosterone, Free: <0.2 pg/mL — ABNORMAL LOW (ref 9.3–26.5)
Testosterone: 14 ng/dL — ABNORMAL LOW (ref 264–916)

## 2016-07-13 LAB — T4, FREE: FREE T4: 1.25 ng/dL (ref 0.82–1.77)

## 2016-07-19 ENCOUNTER — Ambulatory Visit (INDEPENDENT_AMBULATORY_CARE_PROVIDER_SITE_OTHER): Payer: Medicare Other | Admitting: "Endocrinology

## 2016-07-19 ENCOUNTER — Encounter: Payer: Self-pay | Admitting: "Endocrinology

## 2016-07-19 VITALS — BP 112/76 | HR 86 | Ht 70.0 in

## 2016-07-19 DIAGNOSIS — E038 Other specified hypothyroidism: Secondary | ICD-10-CM

## 2016-07-19 DIAGNOSIS — E291 Testicular hypofunction: Secondary | ICD-10-CM | POA: Insufficient documentation

## 2016-07-19 DIAGNOSIS — E2740 Unspecified adrenocortical insufficiency: Secondary | ICD-10-CM

## 2016-07-19 MED ORDER — TESTOSTERONE 20.25 MG/ACT (1.62%) TD GEL: 20.2500 mg | Freq: Every day | TRANSDERMAL | 2 refills | Status: DC

## 2016-07-19 NOTE — Progress Notes (Signed)
Subjective:    Patient ID: James Hoffman, male    DOB: [birth year], PCP Kenn File, MD   Past Medical History:  Diagnosis Date  . Adrenal insufficiency ([place])   . 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 Hydrocephalus   . Thyroid disease    Past Surgical History:  Procedure Laterality Date  . brain tumor removal  August 2012   Social History   Social History  . Marital status: Single    Spouse name: N/A  . Number of children: N/A  . Years of education: N/A   Social History Main Topics  . Smoking status: Current Every Day Smoker    Packs/day: 0.50    Types: Cigarettes    Start date: 11/25/2012  . Smokeless tobacco: Never Used  . Alcohol use No  . Drug use: No  . Sexual activity: Not Asked   Other Topics Concern  . None   Social History Narrative  . None   Outpatient Encounter Prescriptions as of 07/19/2016  Medication Sig  . levothyroxine (SYNTHROID, LEVOTHROID) 88 MCG tablet Take 1 tablet (88 mcg total) by mouth daily before breakfast.  . predniSONE (DELTASONE) 10 MG tablet Take 1 tablet (10 mg total) by mouth daily with breakfast.  . Testosterone (ANDROGEL PUMP) 20.25 MG/ACT (1.62%) GEL Place 20.25 mg onto the skin daily.   No facility-administered encounter medications on file as of 07/19/2016.    ALLERGIES: No Known Allergies VACCINATION STATUS:  There is no immunization history on file for this patient.  HPI  20 year old male patient with medical history as above. He is being seen in f/u for history of adrenal insufficiency and hypothyroidism requested by Dr. Wendi Snipes. -He has a long and complicated medical history. 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  -His surgery and treatment for medulloblastoma took place at [facility] in [place]. On subsequent years he was diagnosed with hypothyroidism and adrenal insufficiency. He is currently on prednisone 10 mg by mouth  daily and levothyroxine 88 g by mouth every morning. - He continues to have ataxia , following up with outpatient physical therapy. He has significant disequilibrium and on and off dizziness and lightheadedness.  He has both motor and sensory aphasia. His grandmother and sister are helping eliciting history. -Reportedly, he never grew any mustache nor beard and never had to shave. -He has never engaged in sexual activity her family's history.   Review of Systems Constitutional:  Has always been light build, + fatigue, no subjective hyperthermia/hypothermia - He walks with a special walker. Eyes: no blurry vision, no xerophthalmia ENT: no sore throat, no nodules palpated in throat, no dysphagia/odynophagia, no hoarseness Cardiovascular: no CP/SOB/palpitations/leg swelling Respiratory: no cough/SOB Gastrointestinal: no N/V/D/C Musculoskeletal: no muscle/joint aches Skin: no rashes Neurological: no tremors, + complains of numbness of left upper extremity associated with tingling. Marland Kitchen He has on and off lightheadedness and dizziness. Psychiatric: no depression/anxiety  Objective:    BP 112/76   Pulse 86   Ht 5\' 10"  (1.778 m)   Wt Readings from Last 3 Encounters:  06/28/16 118 lb (53.5 kg)  05/31/16 118 lb (53.5 kg)  09/17/15 116 lb 9.6 oz (52.9 kg) (2 %, Z= -2.01)*   * Growth percentiles are based on CDC 2-20 Years data.    Physical Exam  Constitutional: Light build, chronically sick-looking in NAD, he ambulates with a special walker. Eyes: PERRLA, EOMI, no exophthalmos ENT: Poor dentition, healed  post craniotomy scar on the back of his head, moist mucous membranes, no thyromegaly, no cervical lymphadenopathy Cardiovascular: RRR, No MRG Respiratory: CTA B Gastrointestinal: abdomen soft, NT, ND, BS+ Musculoskeletal: Global loss of skeletal muscles, no deformities, strength 3 out of 5 in all 4 extremities. Skin: moist, warm, no rashes, has poor skin turgor , no mustache no beard. He  wanted to defer genital exam. Neurological: no tremor with outstretched hands, DTR normal in all 4   CMP     Component Value Date/Time   NA 139 07/12/2016 0000   K 3.4 (L) 07/12/2016 0000   CL 95 (L) 07/12/2016 0000   CO2 26 07/12/2016 0000   GLUCOSE 101 (H) 07/12/2016 0000   GLUCOSE 96 09/10/2015 0533   BUN 8 07/12/2016 0000   CREATININE 0.88 07/12/2016 0000   CALCIUM 10.0 07/12/2016 0000   PROT 6.2 07/12/2016 0000   ALBUMIN 4.3 07/12/2016 0000   AST 12 07/12/2016 0000   ALT 9 07/12/2016 0000   ALKPHOS 65 07/12/2016 0000   BILITOT 0.3 07/12/2016 0000   GFRNONAA 124 07/12/2016 0000   GFRAA 143 07/12/2016 0000     Assessment & Plan:   1. Adrenal insufficiency ([place]) - He seems to have well settled diagnosis of secondary adrenal insufficiency likely other result of surgery and radiation to his head associated with his history of medulloblastoma of the brainstem. The details of his cancer therapy are not available to review today. -He did Better with increased steroid support. I will  continue  his prednisone  10 mg by mouth every morning. I advised him on sick day rules where he can double his prednisone until the stressors are resolved. - This patient needs screening for other endocrine deficiencies including hypogonadism. I will obtain CMP, FSH/LH, total testosterone on the morning something before his next visit. - He will also need DEXA scan on a later date.   2. Other specified hypothyroidism - His thyroid function tests are within target. I discussed the need for levothyroxine replacement for life with him. I will continue current dose of levothyroxine at 88 g by mouth every morning.  - We discussed about correct intake of levothyroxine, at fasting, with water, separated by at least 30 minutes from breakfast, and separated by more than 4 hours from calcium, iron, multivitamins, acid reflux medications (PPIs). -Patient is made aware of the fact that thyroid hormone  replacement is needed for life, dose to be adjusted by periodic monitoring of thyroid function tests.  -I advised him to continue physical therapy as an outpatient.  -I have extensively counseled him against smoking.  -He will need for medical alert device to wear on his body depicting his history is specially of the need for steroids.  3. Profound hypogonadism: His total testosterone is 14 ! - He will benefit from some degree of testosterone replacement, this will hopefully help with the muscular deconditioning he is dealing with as well. -I discuss his options. Due to his poor social support, he would not be able to do injectable testosterone. I prescribed AndroGel 20.25mg  pump once in the morning daily. He will likely require higher doses however this is a safe dose to start with.  - I have instructed is the family to document how much liquids he drinks and how many times he visits the bathroom. This is a patient with at-risk of diabetes insipidus.  - He has missed a scheduled bone density scan with Dr. Wendi Snipes. I advised him to call back and  reschedule.   - I advised patient to maintain close follow up with Kenn File, MD for primary care needs. Follow up plan: Return in about 3 months (around 10/18/2016) for follow up with pre-visit labs.  Glade Lloyd, MD Phone: [phone]  Fax: [phone]   07/19/2016, 4:12 PM

## 2016-07-20 ENCOUNTER — Encounter: Payer: Medicare Other | Admitting: *Deleted

## 2016-07-25 ENCOUNTER — Encounter: Payer: Medicare Other | Admitting: *Deleted

## 2016-07-27 ENCOUNTER — Encounter: Payer: Medicare Other | Admitting: *Deleted

## 2016-08-01 ENCOUNTER — Encounter: Payer: Medicare Other | Admitting: *Deleted

## 2016-08-03 ENCOUNTER — Encounter: Payer: Medicare Other | Admitting: *Deleted

## 2016-08-08 ENCOUNTER — Ambulatory Visit: Payer: Medicare Other | Attending: Family Medicine | Admitting: *Deleted

## 2016-08-08 DIAGNOSIS — R260 Ataxic gait: Secondary | ICD-10-CM | POA: Insufficient documentation

## 2016-08-08 DIAGNOSIS — R2681 Unsteadiness on feet: Secondary | ICD-10-CM | POA: Diagnosis not present

## 2016-08-08 DIAGNOSIS — M6281 Muscle weakness (generalized): Secondary | ICD-10-CM | POA: Diagnosis not present

## 2016-08-08 NOTE — Therapy (Signed)
[facility] [place], [place], [address] Phone: [phone]   Fax:  [phone]  Physical Therapy Treatment  Patient Details  Name: James Hoffman MRN: [id number] Date of Birth: Sep 07, 1996 Referring Provider: Kenn File MD.  Encounter Date: 08/08/2016      PT End of Session - 08/08/16 1710    Visit Number 17   Number of Visits 28   Date for PT Re-Evaluation 09/05/16   PT Start Time [id number]   PT Stop Time 1605   PT Time Calculation (min) 50 min      Past Medical History:  Diagnosis Date  . Adrenal insufficiency ([place])   . 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 Hydrocephalus   . Thyroid disease     Past Surgical History:  Procedure Laterality Date  . brain tumor removal  August 2012    There were no vitals filed for this visit.      Subjective Assessment - 08/08/16 1548    Subjective I  would like to workout on the machines again. Testosterone is helping. Have been sick for 2 weeks   Limitations Walking   Patient Stated Goals I want to get stronger and walk better.   Currently in Pain? No/denies                         [facility] Adult PT Treatment/Exercise - 08/08/16 0001      Ambulation/Gait   Ambulation Distance (Feet) 125 Feet   Assistive device Standard walker   Gait Pattern Step-through pattern;Scissoring   Ambulation Surface Level   Gait Comments will try LBQC next time     Exercises   Exercises Knee/Hip     Knee/Hip Exercises: Aerobic   Nustep Level 5 x 15 minutes.     Knee/Hip Exercises: Machines for Strengthening   Cybex Knee Extension 20# 5x10   Cybex Knee Flexion 30#s 5x10   Other Machine Leg press 2pl 5 x10 with focus on technique  Focus on LT knee and prevent hyper-extension. Cues to slow d                  PT Short Term Goals - 04/27/16 1629      PT SHORT TERM GOAL #1   Title Ind with an initial HEP.   Time 2   Period Weeks   Status  Achieved           PT Long Term Goals - 05/04/16 1850      PT LONG TERM GOAL #1   Title Ind with an advanced HEP.   Baseline No knowledge of appropriate ther ex.   Time 8   Period Weeks   Status On-going     PT LONG TERM GOAL #2   Title Increase bilateral LE strength to a solid 4+/5.   Time 8   Period Weeks   Status On-going     PT LONG TERM GOAL #3   Title Sit to stand x 5 with SBA.   Time 8   Period Weeks   Status Achieved     PT LONG TERM GOAL #4   Title Walk in clinic with CGA/SBA with a cane 500 feet.   Time 8   Period Weeks   Status On-going               Plan - 08/08/16 1517    Clinical Impression Statement Pt did fairly well today with  Rx and was able to progress Sets and reps for LE strengthening. Even his gait was better with less ataxic pattern with standard walker. He wants to try [facility] next Rx. . Pt was fatigued at the end of Rx ,but felt good. Current goals are still ongoing due to strength and balance deficits.   Rehab Potential Good   PT Frequency 2x / week   PT Duration 8 weeks   PT Treatment/Interventions ADLs/Self Care Home Management;Therapeutic exercise;Therapeutic activities;Gait training;Neuromuscular re-education;Patient/family education   PT Next Visit Plan Nustep; bil lE strengthening; gait and balance activites.   Consulted and Agree with Plan of Care Patient      Patient will benefit from skilled therapeutic intervention in order to improve the following deficits and impairments:  Abnormal gait, Decreased activity tolerance, Decreased strength  Visit Diagnosis: Ataxic gait  Unsteadiness on feet  Muscle weakness (generalized)     Problem List Patient Active Problem List   Diagnosis Date Noted  . Hypogonadism, male 07/19/2016  . Normocytic anemia 04/14/2016  . Muscular deconditioning 09/17/2015  . Other specified hypothyroidism 09/09/2015  . Medullary carcinoma ([place]) 09/09/2015  . Adrenal insufficiency ([place]) 09/09/2015   . Hypotension 09/09/2015  . Hyponatremia 09/09/2015  . Hypokalemia 09/09/2015    [name],[name] , PTA 08/08/2016, 5:17 PM  [facility] [place], [place], [address] Phone: [phone]   Fax:  [phone]  Name: James Hoffman MRN: [id number] Date of Birth: [birth year]

## 2016-08-08 NOTE — Therapy (Signed)
[facility] [place], [place], [address] Phone: [phone]   Fax:  [phone]  Physical Therapy Treatment  Patient Details  Name: James Hoffman MRN: [id number] Date of Birth: 08/25/1996 Referring Provider: Kenn File MD.  Encounter Date: 08/08/2016      PT End of Session - 08/08/16 1710    Visit Number 17   Number of Visits 28   Date for PT Re-Evaluation 09/05/16   PT Start Time [id number]   PT Stop Time 1605   PT Time Calculation (min) 50 min      Past Medical History:  Diagnosis Date  . Adrenal insufficiency ([place])   . 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 Hydrocephalus   . Thyroid disease     Past Surgical History:  Procedure Laterality Date  . brain tumor removal  August 2012    There were no vitals filed for this visit.      Subjective Assessment - 08/08/16 1548    Subjective I  would like to workout on the machines again. Testosterone is helping. Have been sick for 2 weeks   Limitations Walking   Patient Stated Goals I want to get stronger and walk better.   Currently in Pain? No/denies                         [facility] Adult PT Treatment/Exercise - 08/08/16 0001      Ambulation/Gait   Ambulation Distance (Feet) 125 Feet   Assistive device Standard walker   Gait Pattern Step-through pattern;Scissoring   Ambulation Surface Level   Gait Comments will try LBQC next time     Exercises   Exercises Knee/Hip     Knee/Hip Exercises: Aerobic   Nustep Level 5 x 15 minutes.     Knee/Hip Exercises: Machines for Strengthening   Cybex Knee Extension 20# 5x10   Cybex Knee Flexion 30#s 5x10   Other Machine Leg press 2pl 5 x10 with focus on technique  Focus on LT knee and prevent hyper-extension. Cues to slow d                  PT Short Term Goals - 04/27/16 1629      PT SHORT TERM GOAL #1   Title Ind with an initial HEP.   Time 2   Period Weeks   Status  Achieved           PT Long Term Goals - 05/04/16 1850      PT LONG TERM GOAL #1   Title Ind with an advanced HEP.   Baseline No knowledge of appropriate ther ex.   Time 8   Period Weeks   Status On-going     PT LONG TERM GOAL #2   Title Increase bilateral LE strength to a solid 4+/5.   Time 8   Period Weeks   Status On-going     PT LONG TERM GOAL #3   Title Sit to stand x 5 with SBA.   Time 8   Period Weeks   Status Achieved     PT LONG TERM GOAL #4   Title Walk in clinic with CGA/SBA with a cane 500 feet.   Time 8   Period Weeks   Status On-going               Plan - 08/08/16 1517    Clinical Impression Statement Pt did fairly well today with  Rx and was able to progress Sets and reps for LE strengthening. Even his gait was better with less ataxic pattern with standard walker. He wants to try [facility] next Rx. . Pt was fatigued at the end of Rx ,but felt good. Current goals are still ongoing due to strength and balance deficits.   Rehab Potential Good   PT Frequency 2x / week   PT Duration 8 weeks   PT Treatment/Interventions ADLs/Self Care Home Management;Therapeutic exercise;Therapeutic activities;Gait training;Neuromuscular re-education;Patient/family education   PT Next Visit Plan Nustep; bil lE strengthening; gait and balance activites.   Consulted and Agree with Plan of Care Patient      Patient will benefit from skilled therapeutic intervention in order to improve the following deficits and impairments:  Abnormal gait, Decreased activity tolerance, Decreased strength  Visit Diagnosis: Ataxic gait  Unsteadiness on feet  Muscle weakness (generalized)     Problem List Patient Active Problem List   Diagnosis Date Noted  . Hypogonadism, male 07/19/2016  . Normocytic anemia 04/14/2016  . Muscular deconditioning 09/17/2015  . Other specified hypothyroidism 09/09/2015  . Medullary carcinoma ([place]) 09/09/2015  . Adrenal insufficiency ([place]) 09/09/2015   . Hypotension 09/09/2015  . Hyponatremia 09/09/2015  . Hypokalemia 09/09/2015    RAMSEUR,[name] 08/08/2016, 5:20 PM  [facility] [address] [place], [place], [address] Phone: [phone]   Fax:  [phone]  Name: TODD WEIHER MRN: [id number] Date of Birth: [birth year]

## 2016-08-10 ENCOUNTER — Ambulatory Visit: Payer: Medicare Other | Admitting: *Deleted

## 2016-08-10 DIAGNOSIS — R2681 Unsteadiness on feet: Secondary | ICD-10-CM

## 2016-08-10 DIAGNOSIS — M6281 Muscle weakness (generalized): Secondary | ICD-10-CM | POA: Diagnosis not present

## 2016-08-10 DIAGNOSIS — R260 Ataxic gait: Secondary | ICD-10-CM | POA: Diagnosis not present

## 2016-08-10 NOTE — Therapy (Signed)
[facility] [place], [place], [address] Phone: [phone]   Fax:  [phone]  Physical Therapy Treatment  Patient Details  Name: SHLOIMY BOREN MRN: [id number] Date of Birth: 08/31/1996 Referring Provider: Kenn File MD.  Encounter Date: 08/10/2016      PT End of Session - 08/10/16 1536    Visit Number 18   Number of Visits 28   Date for PT Re-Evaluation 09/05/16   PT Start Time [id number]   PT Stop Time [id number]   PT Time Calculation (min) 49 min      Past Medical History:  Diagnosis Date  . Adrenal insufficiency ([place])   . 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 Hydrocephalus   . Thyroid disease     Past Surgical History:  Procedure Laterality Date  . brain tumor removal  August 2012    There were no vitals filed for this visit.      Subjective Assessment - 08/10/16 1535    Subjective I  would like to workout on the machines again. Testosterone is helping. Have been sick for 2 weeks. Did Ok after last Rx   Limitations Walking   Patient Stated Goals I want to get stronger and walk better.                         [place] Adult PT Treatment/Exercise - 08/10/16 0001      Ambulation/Gait   Ambulation/Gait Yes   Ambulation Distance (Feet) 150 Feet   Assistive device Large base quad cane   Gait Pattern Step-through pattern   Ambulation Surface Level   Gait Comments LOB x 2 and needed assistance to regain     Exercises   Exercises Knee/Hip     Knee/Hip Exercises: Aerobic   Nustep Level 5 x 12 minutes.  1315 steps     Knee/Hip Exercises: Machines for Strengthening   Cybex Knee Extension 20# 5x10   Cybex Knee Flexion 30#s 5x10   Other Machine Leg press 2pl 5 x10 with focus on technique  V/Cs to slow down for control     Knee/Hip Exercises: Seated   Sit to Sand 10 reps;with UE support  3 LOB with self correct                  PT Short Term Goals - 04/27/16 1629       PT SHORT TERM GOAL #1   Title Ind with an initial HEP.   Time 2   Period Weeks   Status Achieved           PT Long Term Goals - 05/04/16 1850      PT LONG TERM GOAL #1   Title Ind with an advanced HEP.   Baseline No knowledge of appropriate ther ex.   Time 8   Period Weeks   Status On-going     PT LONG TERM GOAL #2   Title Increase bilateral LE strength to a solid 4+/5.   Time 8   Period Weeks   Status On-going     PT LONG TERM GOAL #3   Title Sit to stand x 5 with SBA.   Time 8   Period Weeks   Status Achieved     PT LONG TERM GOAL #4   Title Walk in clinic with CGA/SBA with a cane 500 feet.   Time 8   Period Weeks   Status On-going  Plan - 08/10/16 1605    Clinical Impression Statement PT DID FAIRLY WELL WITH rX TODAY AND WAS ABLE TO AMBULATE WITH LBQC x 150 ft. with CGA at gait belt. He had 2 LOB during and needed assistance to correct so goal is ongoing. He also had LOB  x3 during  sit to stand, but self corrected and LTG is also ongoing    Rehab Potential Good   PT Frequency 2x / week   PT Duration 8 weeks   PT Treatment/Interventions ADLs/Self Care Home Management;Therapeutic exercise;Therapeutic activities;Gait training;Neuromuscular re-education;Patient/family education   PT Next Visit Plan Nustep; bil lE strengthening; gait and balance activites.   Consulted and Agree with Plan of Care Patient      Patient will benefit from skilled therapeutic intervention in order to improve the following deficits and impairments:  Abnormal gait, Decreased activity tolerance, Decreased strength  Visit Diagnosis: Ataxic gait  Unsteadiness on feet  Muscle weakness (generalized)     Problem List Patient Active Problem List   Diagnosis Date Noted  . Hypogonadism, male 07/19/2016  . Normocytic anemia 04/14/2016  . Muscular deconditioning 09/17/2015  . Other specified hypothyroidism 09/09/2015  . Medullary carcinoma ([place]) 09/09/2015   . Adrenal insufficiency ([place]) 09/09/2015  . Hypotension 09/09/2015  . Hyponatremia 09/09/2015  . Hypokalemia 09/09/2015    Torrie Namba,[name], PTA 08/10/2016, 5:24 PM  [facility] [address] [place], [place], [address] Phone: [phone]   Fax:  [phone]  Name: VASILI JANAK MRN: [id number] Date of Birth: 09/03/1996

## 2016-08-15 ENCOUNTER — Ambulatory Visit: Payer: Medicare Other | Attending: Family Medicine | Admitting: *Deleted

## 2016-08-15 DIAGNOSIS — M6281 Muscle weakness (generalized): Secondary | ICD-10-CM | POA: Diagnosis not present

## 2016-08-15 DIAGNOSIS — R260 Ataxic gait: Secondary | ICD-10-CM | POA: Diagnosis not present

## 2016-08-15 DIAGNOSIS — R2681 Unsteadiness on feet: Secondary | ICD-10-CM | POA: Insufficient documentation

## 2016-08-15 NOTE — Therapy (Signed)
[facility] [place], [place], [address] Phone: [phone]   Fax:  [phone]  Physical Therapy Treatment  Patient Details  Name: James Hoffman MRN: [id number] Date of Birth: [birth year] Referring Provider: Kenn File MD.  Encounter Date: 08/15/2016      PT End of Session - 08/15/16 1609    Visit Number 19   Number of Visits 28   Date for PT Re-Evaluation 09/05/16   Authorization Type Gcode next visit   PT Start Time 1515   PT Stop Time 1606   PT Time Calculation (min) 51 min      Past Medical History:  Diagnosis Date  . Adrenal insufficiency ([place])   . 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 Hydrocephalus   . Thyroid disease     Past Surgical History:  Procedure Laterality Date  . brain tumor removal  August 2012    There were no vitals filed for this visit.      Subjective Assessment - 08/15/16 1532    Subjective RT ear is hurting some today. Get dizzy at times. Gaining wt and feeling stronger.   Limitations Walking   Patient Stated Goals I want to get stronger and walk better.                         [facility] Adult PT Treatment/Exercise - 08/15/16 0001      Ambulation/Gait   Ambulation/Gait Yes   Ambulation Distance (Feet) 165 Feet   Assistive device Large base quad cane   Gait Pattern Step-through pattern   Ambulation Surface Level;Indoor   Gait Comments LOB x 2 and needed assistance to regain     Exercises   Exercises Knee/Hip     Knee/Hip Exercises: Aerobic   Nustep Level 5 x 15 minutes.     Knee/Hip Exercises: Machines for Strengthening   Cybex Knee Extension 30#  5x10   Cybex Knee Flexion 30#s 5x10   Other Machine Leg press 2pl 5 x10 with focus on technique     Knee/Hip Exercises: Seated   Sit to Sand 1 set;10 reps;with UE support  10 reps with no LOB today                  PT Short Term Goals - 04/27/16 1629      PT SHORT TERM GOAL #1    Title Ind with an initial HEP.   Time 2   Period Weeks   Status Achieved           PT Long Term Goals - 08/15/16 1622      PT LONG TERM GOAL #3   Title Sit to stand x 5 with SBA.   Baseline Up to moderate assist required today.   Time 8   Period Weeks   Status Achieved               Plan - 08/15/16 1618    Clinical Impression Statement Pt was able to ambulate further today with [facility] to 165 ft  and only 1 LOB with assistance needed to correct. He was also able to perform sit to stand today x 10 with SBA only and No LOB.   Ambulation  LTG on-going   PT Frequency 2x / week   PT Duration 8 weeks   PT Next Visit Plan Nustep; bil lE strengthening; gait and balance activites.   Consulted and Agree with Plan of Care  Patient      Patient will benefit from skilled therapeutic intervention in order to improve the following deficits and impairments:  Abnormal gait, Decreased activity tolerance, Decreased strength  Visit Diagnosis: Ataxic gait  Unsteadiness on feet  Muscle weakness (generalized)     Problem List Patient Active Problem List   Diagnosis Date Noted  . Hypogonadism, male 07/19/2016  . Normocytic anemia 04/14/2016  . Muscular deconditioning 09/17/2015  . Other specified hypothyroidism 09/09/2015  . Medullary carcinoma ([place]) 09/09/2015  . Adrenal insufficiency ([place]) 09/09/2015  . Hypotension 09/09/2015  . Hyponatremia 09/09/2015  . Hypokalemia 09/09/2015    [name],[name], PTA 08/15/2016, 4:27 PM  [facility] [address] [place], [place], [address] Phone: [phone]   Fax:  [phone]  Name: James Hoffman MRN: [id number] Date of Birth: 02-06-1996

## 2016-08-17 ENCOUNTER — Ambulatory Visit: Payer: Medicare Other | Admitting: *Deleted

## 2016-08-17 DIAGNOSIS — R2681 Unsteadiness on feet: Secondary | ICD-10-CM | POA: Diagnosis not present

## 2016-08-17 DIAGNOSIS — R260 Ataxic gait: Secondary | ICD-10-CM | POA: Diagnosis not present

## 2016-08-17 DIAGNOSIS — M6281 Muscle weakness (generalized): Secondary | ICD-10-CM

## 2016-08-17 NOTE — Therapy (Signed)
[facility] [place], [place], [address] Phone: [phone]   Fax:  [phone]  Physical Therapy Treatment  Patient Details  Name: James Hoffman MRN: [id number] Date of Birth: [birth year] Referring Provider: Kenn File MD.  Encounter Date: 08/17/2016      PT End of Session - 08/17/16 1539    Visit Number 20   Number of Visits 28   Date for PT Re-Evaluation 09/05/16   PT Start Time [id number]   PT Stop Time 1610   PT Time Calculation (min) 55 min      Past Medical History:  Diagnosis Date  . Adrenal insufficiency ([place])   . 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 Hydrocephalus   . Thyroid disease     Past Surgical History:  Procedure Laterality Date  . brain tumor removal  August 2012    There were no vitals filed for this visit.      Subjective Assessment - 08/17/16 1539    Subjective . Gaining wt and feeling stronger.   Limitations Walking   Patient Stated Goals I want to get stronger and walk better.                         [place] Adult PT Treatment/Exercise - 08/17/16 0001      Ambulation/Gait   Ambulation/Gait Yes   Ambulation Distance (Feet) 150 Feet   Assistive device Large base quad cane   Gait Pattern Step-through pattern;Ataxic   Ambulation Surface Level;Indoor   Gait Comments LOB x4 today needed assistance to correct     Exercises   Exercises Knee/Hip     Knee/Hip Exercises: Aerobic   Nustep Level 5 x 15 minutes.     Knee/Hip Exercises: Machines for Strengthening   Cybex Knee Extension 30#  5x10   Cybex Knee Flexion 40#s 5x10   Other Machine Leg press 2pl 5 x10 with focus on technique     Knee/Hip Exercises: Seated   Sit to Sand 1 set;10 reps;with UE support  10 reps with no LOB today     Knee/Hip Exercises: Supine   Bridges 20 reps                  PT Short Term Goals - 04/27/16 1629      PT SHORT TERM GOAL #1   Title Ind with an  initial HEP.   Time 2   Period Weeks   Status Achieved           PT Long Term Goals - 08/15/16 1622      PT LONG TERM GOAL #3   Title Sit to stand x 5 with SBA.   Baseline Up to moderate assist required today.   Time 8   Period Weeks   Status Achieved               Plan - 08/17/16 1556    Clinical Impression Statement Pt didn't do as well today with gait. His movements were more ataxic and he had 4 LOB episodes that needed assistance to correct. He did about the same on strengthening exs, but had less overall energy. Strength was 4/5 Bil. LEs so  LTG is ongoing. Gait goal is also ongoing due to fatigue   PT Frequency 2x / week   PT Duration 8 weeks   PT Treatment/Interventions ADLs/Self Care Home Management;Therapeutic exercise;Therapeutic activities;Gait training;Neuromuscular re-education;Patient/family education   PT Next Visit Plan  Nustep; bil lE strengthening; gait and balance activites.   Consulted and Agree with Plan of Care Patient      Patient will benefit from skilled therapeutic intervention in order to improve the following deficits and impairments:  Abnormal gait, Decreased activity tolerance, Decreased strength  Visit Diagnosis: Ataxic gait  Unsteadiness on feet  Muscle weakness (generalized)     Problem List Patient Active Problem List   Diagnosis Date Noted  . Hypogonadism, male 07/19/2016  . Normocytic anemia 04/14/2016  . Muscular deconditioning 09/17/2015  . Other specified hypothyroidism 09/09/2015  . Medullary carcinoma ([place]) 09/09/2015  . Adrenal insufficiency ([place]) 09/09/2015  . Hypotension 09/09/2015  . Hyponatremia 09/09/2015  . Hypokalemia 09/09/2015    James Hoffman,James Hoffman, James Hoffman 08/17/2016, 4:15 PM  [facility] [place], [place], [address] Phone: [phone]   Fax:  [phone]  Name: James Hoffman MRN: [id number] Date of Birth: [birth year]

## 2016-08-22 ENCOUNTER — Encounter: Payer: Medicare Other | Admitting: Physical Therapy

## 2016-08-24 ENCOUNTER — Ambulatory Visit: Payer: Medicare Other | Admitting: *Deleted

## 2016-08-24 DIAGNOSIS — R260 Ataxic gait: Secondary | ICD-10-CM

## 2016-08-24 DIAGNOSIS — R2681 Unsteadiness on feet: Secondary | ICD-10-CM

## 2016-08-24 DIAGNOSIS — M6281 Muscle weakness (generalized): Secondary | ICD-10-CM | POA: Diagnosis not present

## 2016-08-24 NOTE — Therapy (Signed)
[facility] [place], [place], [address] Phone: [phone]   Fax:  [phone]  Physical Therapy Treatment  Patient Details  Name: James Hoffman MRN: [id number] Date of Birth: 06-23-1996 Referring Provider: Kenn File MD.  Encounter Date: 08/24/2016      PT End of Session - 08/24/16 1557    Visit Number 21   Number of Visits 28   Date for PT Re-Evaluation 09/05/16   PT Start Time [id number]   PT Stop Time 1605   PT Time Calculation (min) 50 min      Past Medical History:  Diagnosis Date  . Adrenal insufficiency ([place])   . 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 Hydrocephalus   . Thyroid disease     Past Surgical History:  Procedure Laterality Date  . brain tumor removal  August 2012    There were no vitals filed for this visit.                       [place] Adult PT Treatment/Exercise - 08/24/16 0001      Ambulation/Gait   Ambulation/Gait Yes   Ambulation Distance (Feet) 160 Feet   Assistive device Large base quad cane   Gait Pattern Step-through pattern;Ataxic   Ambulation Surface Level;Indoor   Gait Comments LOB x4 today needed assistance to correct     Exercises   Exercises Knee/Hip     Knee/Hip Exercises: Aerobic   Nustep Level 5-8 x 15 minutes.     Knee/Hip Exercises: Machines for Strengthening   Cybex Knee Extension 20#  5x10   Cybex Knee Flexion 40#s 5x10   Other Machine Leg press 2pl 5 x10 with focus on technique       Sit to stand x 10 with SBa             PT Short Term Goals - 04/27/16 1629      PT SHORT TERM GOAL #1   Title Ind with an initial HEP.   Time 2   Period Weeks   Status Achieved           PT Long Term Goals - 08/15/16 1622      PT LONG TERM GOAL #3   Title Sit to stand x 5 with SBA.   Baseline Up to moderate assist required today.   Time 8   Period Weeks   Status Achieved               Plan - 08/24/16 1756    Clinical Impression Statement Pt arrived to PT in fairly good spirits today. He was able to perform well on strengthening Act.'s , but had difficulty with gait and balance activities. He needed constant V/Cs  during gait today to slow down and decrease LBQC advancement.Marland Kitchen He lost his balance x4 during gait and needed assistance to correct. During sit to stand he also lost his balance x 3. LTG sare ongoing   Rehab Potential Good   PT Frequency 2x / week   PT Duration 8 weeks   PT Treatment/Interventions ADLs/Self Care Home Management;Therapeutic exercise;Therapeutic activities;Gait training;Neuromuscular re-education;Patient/family education   PT Next Visit Plan Nustep; bil lE strengthening; gait and balance activites.   Consulted and Agree with Plan of Care Patient      Patient will benefit from skilled therapeutic intervention in order to improve the following deficits and impairments:  Abnormal gait, Decreased activity tolerance, Decreased strength  Visit Diagnosis:  Ataxic gait  Unsteadiness on feet  Muscle weakness (generalized)     Problem List Patient Active Problem List   Diagnosis Date Noted  . Hypogonadism, male 07/19/2016  . Normocytic anemia 04/14/2016  . Muscular deconditioning 09/17/2015  . Other specified hypothyroidism 09/09/2015  . Medullary carcinoma ([place]) 09/09/2015  . Adrenal insufficiency ([place]) 09/09/2015  . Hypotension 09/09/2015  . Hyponatremia 09/09/2015  . Hypokalemia 09/09/2015    Marquee Fuchs,[name], PTA 08/24/2016, 6:04 PM  [facility] [place], [place], [address] Phone: [phone]   Fax:  [phone]  Name: James Hoffman MRN: [id number] Date of Birth: [birth year]

## 2016-08-29 ENCOUNTER — Encounter: Payer: Medicare Other | Admitting: *Deleted

## 2016-08-31 ENCOUNTER — Encounter: Payer: Medicare Other | Admitting: *Deleted

## 2016-09-05 ENCOUNTER — Ambulatory Visit: Payer: Medicare Other | Admitting: *Deleted

## 2016-09-05 DIAGNOSIS — R2681 Unsteadiness on feet: Secondary | ICD-10-CM | POA: Diagnosis not present

## 2016-09-05 DIAGNOSIS — R260 Ataxic gait: Secondary | ICD-10-CM

## 2016-09-05 DIAGNOSIS — M6281 Muscle weakness (generalized): Secondary | ICD-10-CM

## 2016-09-05 NOTE — Therapy (Signed)
[facility] [place], [place], [address] Phone: [phone]   Fax:  [phone]  Physical Therapy Treatment  Patient Details  Name: James Hoffman MRN: [id number] Date of Birth: 1996/11/10 Referring Provider: Kenn File MD.  Encounter Date: 09/05/2016      PT End of Session - 09/05/16 1551    Visit Number 22   Number of Visits 28   Date for PT Re-Evaluation 09/05/16   PT Start Time 2458   PT Stop Time 0998   PT Time Calculation (min) 49 min      Past Medical History:  Diagnosis Date  . Adrenal insufficiency ([place])   . 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 Hydrocephalus   . Thyroid disease     Past Surgical History:  Procedure Laterality Date  . brain tumor removal  August 2012    There were no vitals filed for this visit.      Subjective Assessment - 09/05/16 1553    Subjective . Gaining wt and feeling stronger. Have a H/A today. Just work on strengthening today   Limitations Walking   Patient Stated Goals I want to get stronger and walk better.   Currently in Pain? No/denies                         [facility] Adult PT Treatment/Exercise - 09/05/16 0001      Exercises   Exercises Knee/Hip     Knee/Hip Exercises: Aerobic   Nustep Level 5-8 x 15 minutes.     Knee/Hip Exercises: Machines for Strengthening   Cybex Knee Extension 20#  5x10   Cybex Knee Flexion 30#s 5x10   Other Machine Leg press 2pl 5 x10 with focus on technique     Knee/Hip Exercises: Seated   Sit to Sand 10 reps;with UE support;1 set  with CGA                   PT Short Term Goals - 04/27/16 1629      PT SHORT TERM GOAL #1   Title Ind with an initial HEP.   Time 2   Period Weeks   Status Achieved           PT Long Term Goals - 08/15/16 1622      PT LONG TERM GOAL #3   Title Sit to stand x 5 with SBA.   Baseline Up to moderate assist required today.   Time 8   Period Weeks    Status Achieved               Plan - 09/05/16 1553    Clinical Impression Statement Pt arrived to clinic today with c/o a H/A and low energy levels. He wanted to focus on strengthening exs today and did fairly well, but moving slower today due to lack of energy. No new goals met today due to Pt having low energy levels.   Rehab Potential Good   PT Frequency 2x / week   PT Duration 8 weeks   PT Treatment/Interventions ADLs/Self Care Home Management;Therapeutic exercise;Therapeutic activities;Gait training;Neuromuscular re-education;Patient/family education   PT Next Visit Plan Nustep; bil lE strengthening; gait and balance activites.   Consulted and Agree with Plan of Care Patient      Patient will benefit from skilled therapeutic intervention in order to improve the following deficits and impairments:  Abnormal gait, Decreased activity tolerance, Decreased strength  Visit Diagnosis:  Ataxic gait  Unsteadiness on feet  Muscle weakness (generalized)     Problem List Patient Active Problem List   Diagnosis Date Noted  . Hypogonadism, male 07/19/2016  . Normocytic anemia 04/14/2016  . Muscular deconditioning 09/17/2015  . Other specified hypothyroidism 09/09/2015  . Medullary carcinoma ([place]) 09/09/2015  . Adrenal insufficiency ([place]) 09/09/2015  . Hypotension 09/09/2015  . Hyponatremia 09/09/2015  . Hypokalemia 09/09/2015    Ysabel Stankovich,[name], PTA 09/05/2016, 4:26 PM  [facility] [place], [place], [address] Phone: [phone]   Fax:  [phone]  Name: James Hoffman MRN: [id number] Date of Birth: 09-27-1996

## 2016-09-07 ENCOUNTER — Ambulatory Visit: Payer: Medicare Other | Admitting: *Deleted

## 2016-09-07 DIAGNOSIS — M6281 Muscle weakness (generalized): Secondary | ICD-10-CM

## 2016-09-07 DIAGNOSIS — R2681 Unsteadiness on feet: Secondary | ICD-10-CM | POA: Diagnosis not present

## 2016-09-07 DIAGNOSIS — R260 Ataxic gait: Secondary | ICD-10-CM | POA: Diagnosis not present

## 2016-09-07 NOTE — Therapy (Signed)
[facility] [place], [place], [address] Phone: [phone]   Fax:  [phone]  Physical Therapy Treatment  Patient Details  Name: James Hoffman MRN: [id number] Date of Birth: Jan 24, 1996 Referring Provider: Kenn File MD.  Encounter Date: 09/07/2016      PT End of Session - 09/07/16 1539    Visit Number 23   Number of Visits 28   Date for PT Re-Evaluation 10/17/16   PT Start Time [id number]   PT Stop Time 1605   PT Time Calculation (min) 50 min   Activity Tolerance Patient tolerated treatment well   Behavior During Therapy [facility] for tasks assessed/performed      Past Medical History:  Diagnosis Date  . Adrenal insufficiency ([place])   . 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 Hydrocephalus   . Thyroid disease     Past Surgical History:  Procedure Laterality Date  . brain tumor removal  August 2012    There were no vitals filed for this visit.      Subjective Assessment - 09/07/16 1536    Subjective . Gaining wt and feeling stronger. Feeling better today   Limitations Walking   Patient Stated Goals I want to get stronger and walk better.                         [facility] Adult PT Treatment/Exercise - 09/07/16 0001      Ambulation/Gait   Ambulation/Gait Yes   Ambulation/Gait Assistance 4: Min assist   Ambulation Distance (Feet) 122 Feet   Assistive device Small based quad cane   Gait Pattern Step-through pattern;Ataxic   Ambulation Surface Level;Outdoor   Gait Comments Tried SBQC todaywith CGA, but Pt had a harder time maintaining his balance and does better with [facility]     Exercises   Exercises Knee/Hip     Knee/Hip Exercises: Aerobic   Nustep Level 5-8 x 15 minutes.     Knee/Hip Exercises: Machines for Strengthening   Cybex Knee Extension 20#  6x10   Cybex Knee Flexion 30#s 6x10   Other Machine Leg press 2 pl 5 x10 with focus on technique                  PT  Short Term Goals - 04/27/16 1629      PT SHORT TERM GOAL #1   Title Ind with an initial HEP.   Time 2   Period Weeks   Status Achieved           PT Long Term Goals - 08/15/16 1622      PT LONG TERM GOAL #3   Title Sit to stand x 5 with SBA.   Baseline Up to moderate assist required today.   Time 8   Period Weeks   Status Achieved               Plan - 09/07/16 1539    Clinical Impression Statement Pt did a little better with Rx today than last time. He had more energy and was able to perform gait training. We tried a [facility] today, but it was fairly challenging for Pt. due to balance deficits. He needed a little more assistance  during RT LE advancement due to LT LE weakness. The resistance remained the same for strengthening Ex.'s, but the Reps were increased. Unable to  meet LTG for ambulating yet and is ongoing.   Rehab Potential Good  PT Frequency 2x / week   PT Duration 8 weeks   PT Treatment/Interventions ADLs/Self Care Home Management;Therapeutic exercise;Therapeutic activities;Gait training;Neuromuscular re-education;Patient/family education   PT Next Visit Plan Nustep; bil lE strengthening; gait and balance activites.   Consulted and Agree with Plan of Care Patient      Patient will benefit from skilled therapeutic intervention in order to improve the following deficits and impairments:  Abnormal gait, Decreased activity tolerance, Decreased strength  Visit Diagnosis: Ataxic gait  Unsteadiness on feet  Muscle weakness (generalized)     Problem List Patient Active Problem List   Diagnosis Date Noted  . Hypogonadism, male 07/19/2016  . Normocytic anemia 04/14/2016  . Muscular deconditioning 09/17/2015  . Other specified hypothyroidism 09/09/2015  . Medullary carcinoma ([place]) 09/09/2015  . Adrenal insufficiency ([place]) 09/09/2015  . Hypotension 09/09/2015  . Hyponatremia 09/09/2015  . Hypokalemia 09/09/2015    Sabryna Lahm,[name], PTA 09/07/2016, 4:31  PM  [facility] [place], [place], [address] Phone: [phone]   Fax:  [phone]  Name: James Hoffman MRN: [id number] Date of Birth: [birth year]

## 2016-09-12 ENCOUNTER — Ambulatory Visit: Payer: Medicare Other | Admitting: *Deleted

## 2016-09-12 DIAGNOSIS — R2681 Unsteadiness on feet: Secondary | ICD-10-CM | POA: Diagnosis not present

## 2016-09-12 DIAGNOSIS — R260 Ataxic gait: Secondary | ICD-10-CM | POA: Diagnosis not present

## 2016-09-12 DIAGNOSIS — M6281 Muscle weakness (generalized): Secondary | ICD-10-CM

## 2016-09-12 NOTE — Therapy (Signed)
[facility] [place], [place], [address] Phone: [phone]   Fax:  [phone]  Physical Therapy Treatment  Patient Details  Name: James Hoffman MRN: [id number] Date of Birth: 09-21-1996 Referring Provider: Kenn File MD.  Encounter Date: 09/12/2016      PT End of Session - 09/12/16 1543    Visit Number 24   Number of Visits 28   Date for PT Re-Evaluation 10/17/16   PT Start Time [id number]  Pt was 15 mins late   PT Stop Time 1607   PT Time Calculation (min) 35 min      Past Medical History:  Diagnosis Date  . Adrenal insufficiency ([place])   . 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 Hydrocephalus   . Thyroid disease     Past Surgical History:  Procedure Laterality Date  . brain tumor removal  August 2012    There were no vitals filed for this visit.      Subjective Assessment - 09/12/16 1542    Subjective . Gaining wt and feeling stronger. Feeling better today. 15 mins late   Limitations Walking   Patient Stated Goals I want to get stronger and walk better.   Currently in Pain? No/denies                         [facility] Adult PT Treatment/Exercise - 09/12/16 0001      Ambulation/Gait   Ambulation/Gait Yes   Ambulation/Gait Assistance 4: Min assist   Ambulation Distance (Feet) 40 Feet   Assistive device Large base quad cane   Gait Pattern Step-through pattern;Ataxic   Ambulation Surface Level;Indoor   Gait Comments LOB x 2 today due to increased length in advancement of [facility]     Exercises   Exercises Knee/Hip     Knee/Hip Exercises: Aerobic   Nustep Level 5-8 x 15 minutes.     Knee/Hip Exercises: Machines for Strengthening   Cybex Knee Extension 20#  6x10   Cybex Knee Flexion 30#s 6x10   Other Machine Leg press 2.5 pl 5 x10 with focus on technique                  PT Short Term Goals - 04/27/16 1629      PT SHORT TERM GOAL #1   Title Ind with an initial  HEP.   Time 2   Period Weeks   Status Achieved           PT Long Term Goals - 08/15/16 1622      PT LONG TERM GOAL #3   Title Sit to stand x 5 with SBA.   Baseline Up to moderate assist required today.   Time 8   Period Weeks   Status Achieved               Plan - 09/12/16 1544    Clinical Impression Statement Pt was 15 mins late. We focused mainly on strengthening and LT LE quad control today. Pt did fairly well with TKE on LT knee today during leg press with focus on eccectrics and not letting  letting his knee hyper-extend.    Rehab Potential Good   PT Frequency 2x / week   PT Duration 8 weeks   PT Treatment/Interventions ADLs/Self Care Home Management;Therapeutic exercise;Therapeutic activities;Gait training;Neuromuscular re-education;Patient/family education   PT Next Visit Plan Nustep; bil lE strengthening; gait and balance activites.   Consulted and Agree  with Plan of Care Patient      Patient will benefit from skilled therapeutic intervention in order to improve the following deficits and impairments:  Abnormal gait, Decreased activity tolerance, Decreased strength  Visit Diagnosis: Ataxic gait  Unsteadiness on feet  Muscle weakness (generalized)     Problem List Patient Active Problem List   Diagnosis Date Noted  . Hypogonadism, male 07/19/2016  . Normocytic anemia 04/14/2016  . Muscular deconditioning 09/17/2015  . Other specified hypothyroidism 09/09/2015  . Medullary carcinoma ([place]) 09/09/2015  . Adrenal insufficiency ([place]) 09/09/2015  . Hypotension 09/09/2015  . Hyponatremia 09/09/2015  . Hypokalemia 09/09/2015    Lian Pounds,[name], PTA 09/12/2016, 4:32 PM  [facility] [place], [place], [address] Phone: [phone]   Fax:  [phone]  Name: James Hoffman MRN: [id number] Date of Birth: Apr 11, 1996

## 2016-09-13 ENCOUNTER — Other Ambulatory Visit: Payer: Self-pay

## 2016-09-13 DIAGNOSIS — E038 Other specified hypothyroidism: Secondary | ICD-10-CM

## 2016-09-13 MED ORDER — LEVOTHYROXINE SODIUM 88 MCG PO TABS: 88.0000 ug | ORAL_TABLET | Freq: Every day | ORAL | 2 refills | Status: DC

## 2016-09-14 ENCOUNTER — Ambulatory Visit: Payer: Medicare Other | Attending: Family Medicine | Admitting: *Deleted

## 2016-09-14 DIAGNOSIS — M6281 Muscle weakness (generalized): Secondary | ICD-10-CM | POA: Insufficient documentation

## 2016-09-14 DIAGNOSIS — R2681 Unsteadiness on feet: Secondary | ICD-10-CM | POA: Insufficient documentation

## 2016-09-14 DIAGNOSIS — R260 Ataxic gait: Secondary | ICD-10-CM | POA: Diagnosis not present

## 2016-09-14 NOTE — Therapy (Signed)
[facility] [place], [place], [address] Phone: [phone]   Fax:  [phone]  Physical Therapy Treatment  Patient Details  Name: James Hoffman MRN: [id number] Date of Birth: 1996/01/22 Referring Provider: Kenn File MD.  Encounter Date: 09/14/2016      PT End of Session - 09/14/16 1649    Visit Number 25   Number of Visits 28   Date for PT Re-Evaluation 10/17/16   PT Start Time [id number]   PT Stop Time 1606   PT Time Calculation (min) 51 min      Past Medical History:  Diagnosis Date  . Adrenal insufficiency ([place])   . 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 Hydrocephalus   . Thyroid disease     Past Surgical History:  Procedure Laterality Date  . brain tumor removal  August 2012    There were no vitals filed for this visit.                       [facility] Adult PT Treatment/Exercise - 09/14/16 0001      Ambulation/Gait   Gait Comments Gait with 4 wheeled walker in clinic     Exercises   Exercises Knee/Hip     Knee/Hip Exercises: Aerobic   Recumbent Bike L2-5 x 6 mins   Nustep Level 5-8 x 15 minutes.     Knee/Hip Exercises: Machines for Strengthening   Other Machine Leg press 2.5 pl 5 x10 with focus on technique     Knee/Hip Exercises: Seated   Sit to Sand 10 reps;with UE support;1 set  with SBA/CGA. Pt was able to stand longer indepedently afte                   PT Short Term Goals - 04/27/16 1629      PT SHORT TERM GOAL #1   Title Ind with an initial HEP.   Time 2   Period Weeks   Status Achieved           PT Long Term Goals - 09/14/16 1653      PT LONG TERM GOAL #1   Title Ind with an advanced HEP.   Baseline No knowledge of appropriate ther ex.   Time 8   Period Weeks   Status On-going     PT LONG TERM GOAL #2   Title Increase bilateral LE strength to a solid 4+/5.   Baseline Bilateral LE strength grades from 3- to 4-/5.   Time 8   Period Weeks   Status On-going     PT LONG TERM GOAL #3   Title Sit to stand x 5 with SBA.   Baseline Up to moderate assist required today.   Time 8   Period Weeks   Status Achieved     PT LONG TERM GOAL #4   Title Walk in clinic with CGA/SBA with a cane 500 feet.   Baseline Patient requires HHA to ambulate safely.   Time 8   Period Weeks   Status On-going               Plan - 09/14/16 1653    Clinical Impression Statement Pt entered  clinic today with a 4 wheeled walker. We practiced in the clinic and Pt did fairly well, but needed cues to slow down and stand more erect. The walker was adjusted to max Ht and was a little short. still. He currently uses  it some at home and might be the safest device for him at this time. He is still a little unstable on [facility] and needs SBA/CGA at times.. Strength goal is 4/5 LT LE today and goals are ongoing   Rehab Potential Good   PT Frequency 2x / week   PT Duration 8 weeks   PT Treatment/Interventions ADLs/Self Care Home Management;Therapeutic exercise;Therapeutic activities;Gait training;Neuromuscular re-education;Patient/family education   PT Next Visit Plan Nustep; bil lE strengthening; gait and balance activites.   Consulted and Agree with Plan of Care Patient      Patient will benefit from skilled therapeutic intervention in order to improve the following deficits and impairments:  Abnormal gait, Decreased activity tolerance, Decreased strength  Visit Diagnosis: Ataxic gait  Unsteadiness on feet  Muscle weakness (generalized)     Problem List Patient Active Problem List   Diagnosis Date Noted  . Hypogonadism, male 07/19/2016  . Normocytic anemia 04/14/2016  . Muscular deconditioning 09/17/2015  . Other specified hypothyroidism 09/09/2015  . Medullary carcinoma ([place]) 09/09/2015  . Adrenal insufficiency ([place]) 09/09/2015  . Hypotension 09/09/2015  . Hyponatremia 09/09/2015  . Hypokalemia 09/09/2015    Yana Schorr,[name],  PTA 09/14/2016, 5:14 PM  [facility] [place], [place], [address] Phone: [phone]   Fax:  [phone]  Name: James Hoffman MRN: [id number] Date of Birth: December 31, 1995

## 2016-09-26 ENCOUNTER — Ambulatory Visit: Payer: Medicare Other | Admitting: *Deleted

## 2016-09-28 ENCOUNTER — Ambulatory Visit: Payer: Medicare Other | Admitting: *Deleted

## 2016-09-28 DIAGNOSIS — R2681 Unsteadiness on feet: Secondary | ICD-10-CM | POA: Diagnosis not present

## 2016-09-28 DIAGNOSIS — R260 Ataxic gait: Secondary | ICD-10-CM | POA: Diagnosis not present

## 2016-09-28 DIAGNOSIS — M6281 Muscle weakness (generalized): Secondary | ICD-10-CM

## 2016-09-28 NOTE — Therapy (Signed)
[facility] [place], [place], [address] Phone: [phone]   Fax:  [phone]  Physical Therapy Treatment  Patient Details  Name: James Hoffman MRN: [id number] Date of Birth: [birth year] Referring Provider: Kenn File MD.  Encounter Date: 09/28/2016      PT End of Session - 09/28/16 1536    Visit Number 26   Number of Visits 28   Date for PT Re-Evaluation 10/17/16   Authorization Type Gcode next visit   PT Start Time 1515   PT Stop Time 1605   PT Time Calculation (min) 50 min   Equipment Utilized During Treatment Back brace   Activity Tolerance Patient tolerated treatment well   Behavior During Therapy [facility] for tasks assessed/performed      Past Medical History:  Diagnosis Date  . Adrenal insufficiency ([place])   . 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 Hydrocephalus   . Thyroid disease     Past Surgical History:  Procedure Laterality Date  . brain tumor removal  August 2012    There were no vitals filed for this visit.                       [facility] Adult PT Treatment/Exercise - 09/28/16 0001      Ambulation/Gait   Ambulation/Gait Yes   Ambulation/Gait Assistance 4: Min assist   Ambulation Distance (Feet) 160 Feet   Assistive device Large base quad cane   Gait Pattern Step-through pattern;Ataxic   Ambulation Surface Level;Indoor   Gait Comments Pt not doing as well today. He had 4 LOB episodes and needed min a to regain     Exercises   Exercises Knee/Hip     Knee/Hip Exercises: Aerobic   Nustep Level 6 x 15 minutes.     Knee/Hip Exercises: Machines for Strengthening   Cybex Knee Extension 20#  6x10   Cybex Knee Flexion 30#s 6x10   Other Machine Leg press 2.5 pl 5 x10 with focus on technique and keeping LT knee from hyper-extension     Knee/Hip Exercises: Seated   Sit to Sand --  with SBA/CGA. Pt was able to stand longer indepedently afte      Knee/Hip Exercises:  Supine   Bridges Strengthening;3 sets;20 reps                  PT Short Term Goals - 04/27/16 1629      PT SHORT TERM GOAL #1   Title Ind with an initial HEP.   Time 2   Period Weeks   Status Achieved           PT Long Term Goals - 09/14/16 1653      PT LONG TERM GOAL #1   Title Ind with an advanced HEP.   Baseline No knowledge of appropriate ther ex.   Time 8   Period Weeks   Status On-going     PT LONG TERM GOAL #2   Title Increase bilateral LE strength to a solid 4+/5.   Baseline Bilateral LE strength grades from 3- to 4-/5.   Time 8   Period Weeks   Status On-going     PT LONG TERM GOAL #3   Title Sit to stand x 5 with SBA.   Baseline Up to moderate assist required today.   Time 8   Period Weeks   Status Achieved     PT LONG TERM GOAL #4  Title Walk in clinic with CGA/SBA with a cane 500 feet.   Baseline Patient requires HHA to ambulate safely.   Time 8   Period Weeks   Status On-going               Plan - 09/28/16 1538    Clinical Impression Statement Pt arrived at clinic today ambulating with a 4WW again, but had low energy levels. We ambulated in clinic with [facility] and Pt had 4 LOB episodes that required min assist to correct and was unable to meet LTG. He did fairly well with strengthening exs today, but unable to meet any new goals  today.   Rehab Potential Good   PT Frequency 2x / week   PT Duration 8 weeks   PT Treatment/Interventions ADLs/Self Care Home Management;Therapeutic exercise;Therapeutic activities;Gait training;Neuromuscular re-education;Patient/family education   PT Next Visit Plan Nustep; bil lE strengthening; gait and balance activites.   Consulted and Agree with Plan of Care Patient      Patient will benefit from skilled therapeutic intervention in order to improve the following deficits and impairments:  Abnormal gait, Decreased activity tolerance, Decreased strength  Visit Diagnosis: Ataxic  gait  Unsteadiness on feet  Muscle weakness (generalized)     Problem List Patient Active Problem List   Diagnosis Date Noted  . Hypogonadism, male 07/19/2016  . Normocytic anemia 04/14/2016  . Muscular deconditioning 09/17/2015  . Other specified hypothyroidism 09/09/2015  . Medullary carcinoma ([place]) 09/09/2015  . Adrenal insufficiency ([place]) 09/09/2015  . Hypotension 09/09/2015  . Hyponatremia 09/09/2015  . Hypokalemia 09/09/2015    Sharian Delia,[name], PTA 09/28/2016, 10:09 PM  [facility] [place], [place], [address] Phone: [phone]   Fax:  [phone]  Name: James Hoffman MRN: [id number] Date of Birth: 1996/01/29

## 2016-10-03 ENCOUNTER — Encounter: Payer: Medicare Other | Admitting: *Deleted

## 2016-10-10 ENCOUNTER — Encounter: Payer: Medicare Other | Admitting: *Deleted

## 2016-10-12 ENCOUNTER — Encounter: Payer: Medicare Other | Admitting: *Deleted

## 2016-10-13 ENCOUNTER — Other Ambulatory Visit: Payer: Medicare Other

## 2016-10-13 DIAGNOSIS — E291 Testicular hypofunction: Secondary | ICD-10-CM | POA: Diagnosis not present

## 2016-10-13 DIAGNOSIS — E2740 Unspecified adrenocortical insufficiency: Secondary | ICD-10-CM | POA: Diagnosis not present

## 2016-10-14 LAB — CMP14+EGFR
ALT: 12 IU/L (ref 0–44)
AST: 13 IU/L (ref 0–40)
Albumin/Globulin Ratio: 2.4 — ABNORMAL HIGH (ref 1.2–2.2)
Albumin: 4.8 g/dL (ref 3.5–5.5)
Alkaline Phosphatase: 76 IU/L (ref 39–117)
BUN/Creatinine Ratio: 8 — ABNORMAL LOW (ref 9–20)
BUN: 7 mg/dL (ref 6–20)
Bilirubin Total: <0.2 mg/dL (ref 0.0–1.2)
CALCIUM: 9.9 mg/dL (ref 8.7–10.2)
CO2: 32 mmol/L — AB (ref 18–29)
CREATININE: 0.84 mg/dL (ref 0.76–1.27)
Chloride: 90 mmol/L — ABNORMAL LOW (ref 96–106)
GFR calc Af Amer: 146 mL/min/{1.73_m2} (ref >59)
GFR, EST NON AFRICAN AMERICAN: 126 mL/min/{1.73_m2} (ref >59)
GLUCOSE: 60 mg/dL — AB (ref 65–99)
Globulin, Total: 2 g/dL (ref 1.5–4.5)
Potassium: 3.8 mmol/L (ref 3.5–5.2)
Sodium: 137 mmol/L (ref 134–144)
Total Protein: 6.8 g/dL (ref 6.0–8.5)

## 2016-10-14 LAB — TESTOSTERONE: TESTOSTERONE: 35 ng/dL — AB (ref 264–916)

## 2016-10-17 ENCOUNTER — Ambulatory Visit: Payer: Medicare Other | Admitting: *Deleted

## 2016-10-19 ENCOUNTER — Ambulatory Visit: Payer: Medicare Other | Admitting: "Endocrinology

## 2016-10-24 ENCOUNTER — Ambulatory Visit: Payer: Medicare Other | Attending: Family Medicine | Admitting: *Deleted

## 2016-10-24 DIAGNOSIS — R2681 Unsteadiness on feet: Secondary | ICD-10-CM | POA: Diagnosis not present

## 2016-10-24 DIAGNOSIS — M6281 Muscle weakness (generalized): Secondary | ICD-10-CM | POA: Diagnosis not present

## 2016-10-24 DIAGNOSIS — R260 Ataxic gait: Secondary | ICD-10-CM | POA: Insufficient documentation

## 2016-10-24 NOTE — Therapy (Addendum)
[facility] [place], [place], [address] Phone: [phone]   Fax:  [phone]  Physical Therapy Treatment  Patient Details  Name: James Hoffman MRN: [id number] Date of Birth: 1996/01/31 Referring Provider: Kenn File MD.  Encounter Date: 10/24/2016    Past Medical History:  Diagnosis Date  . Adrenal insufficiency ([place])   . 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 Hydrocephalus   . Thyroid disease     Past Surgical History:  Procedure Laterality Date  . brain tumor removal  August 2012    There were no vitals filed for this visit.                                 PT Short Term Goals - 04/27/16 1629      PT SHORT TERM GOAL #1   Title Ind with an initial HEP.   Time 2   Period Weeks   Status Achieved           PT Long Term Goals - 09/14/16 1653      PT LONG TERM GOAL #1   Title Ind with an advanced HEP.   Baseline No knowledge of appropriate ther ex.   Time 8   Period Weeks   Status On-going     PT LONG TERM GOAL #2   Title Increase bilateral LE strength to a solid 4+/5.   Baseline Bilateral LE strength grades from 3- to 4-/5.   Time 8   Period Weeks   Status On-going     PT LONG TERM GOAL #3   Title Sit to stand x 5 with SBA.   Baseline Up to moderate assist required today.   Time 8   Period Weeks   Status Achieved     PT LONG TERM GOAL #4   Title Walk in clinic with CGA/SBA with a cane 500 feet.   Baseline Patient requires HHA to ambulate safely.   Time 8   Period Weeks   Status On-going             Patient will benefit from skilled therapeutic intervention in order to improve the following deficits and impairments:  Abnormal gait, Decreased activity tolerance, Decreased strength  Visit Diagnosis: Ataxic gait  Unsteadiness on feet  Muscle weakness (generalized)     Problem List Patient Active Problem List   Diagnosis Date  Noted  . Hypogonadism, male 07/19/2016  . Normocytic anemia 04/14/2016  . Muscular deconditioning 09/17/2015  . Other specified hypothyroidism 09/09/2015  . Medullary carcinoma ([place]) 09/09/2015  . Adrenal insufficiency ([place]) 09/09/2015  . Hypotension 09/09/2015  . Hyponatremia 09/09/2015  . Hypokalemia 09/09/2015    APPLEGATE, [place], PTA 11/20/2016, 3:42 PM  [facility] [address] [place], [place], [address] Phone: [phone]   Fax:  [phone]  Name: James Hoffman MRN: [id number] Date of Birth: November 17, 1995  PHYSICAL THERAPY DISCHARGE SUMMARY  Visits from Start of Care: 27.  Current functional level related to goals / functional outcomes: See above.   Remaining deficits: Continued LE weakness and gait ataxia.  Patient also reportedly had a fall at home an sustained an ankle injury.   Education / Equipment: HEP. Plan: Patient agrees to discharge.  Patient goals were not met. Patient is being discharged due to a change in medical status.  ?????         [place] Metallurgist  MPT

## 2016-10-26 ENCOUNTER — Ambulatory Visit: Payer: Medicare Other | Admitting: *Deleted

## 2016-10-26 ENCOUNTER — Other Ambulatory Visit: Payer: Self-pay | Admitting: "Endocrinology

## 2016-10-31 ENCOUNTER — Ambulatory Visit: Payer: Medicare Other | Admitting: *Deleted

## 2016-11-01 ENCOUNTER — Ambulatory Visit (INDEPENDENT_AMBULATORY_CARE_PROVIDER_SITE_OTHER): Payer: Medicare Other | Admitting: "Endocrinology

## 2016-11-01 ENCOUNTER — Encounter: Payer: Self-pay | Admitting: "Endocrinology

## 2016-11-01 VITALS — BP 110/60 | HR 120 | Resp 18 | Ht 70.0 in | Wt 137.0 lb

## 2016-11-01 DIAGNOSIS — E291 Testicular hypofunction: Secondary | ICD-10-CM

## 2016-11-01 DIAGNOSIS — E038 Other specified hypothyroidism: Secondary | ICD-10-CM

## 2016-11-01 DIAGNOSIS — E2740 Unspecified adrenocortical insufficiency: Secondary | ICD-10-CM | POA: Diagnosis not present

## 2016-11-01 MED ORDER — PREDNISONE 10 MG PO TABS: 10.0000 mg | ORAL_TABLET | Freq: Every day | ORAL | 3 refills | Status: DC

## 2016-11-01 MED ORDER — LEVOTHYROXINE SODIUM 88 MCG PO TABS: 88.0000 ug | ORAL_TABLET | Freq: Every day | ORAL | 2 refills | Status: DC

## 2016-11-01 MED ORDER — TESTOSTERONE 20.25 MG/ACT (1.62%) TD GEL: TRANSDERMAL | 2 refills | Status: DC

## 2016-11-01 NOTE — Progress Notes (Signed)
Subjective:    Patient ID: James Hoffman, male    DOB: [birth year], PCP Kenn File, MD   Past Medical History:  Diagnosis Date  . Adrenal insufficiency ([place])   . Cancer ([place])    brain tumor on brain stem  . Hydrocephalus   . Thyroid disease    Past Surgical History:  Procedure Laterality Date  . brain tumor removal  August 2012   Social History   Social History  . Marital status: Single    Spouse name: N/A  . Number of children: N/A  . Years of education: N/A   Social History Main Topics  . Smoking status: Current Every Day Smoker    Packs/day: 0.50    Types: Cigarettes    Start date: 11/25/2012  . Smokeless tobacco: Never Used  . Alcohol use No  . Drug use: No  . Sexual activity: Not Asked   Other Topics Concern  . None   Social History Narrative  . None   Outpatient Encounter Prescriptions as of 11/01/2016  Medication Sig  . levothyroxine (SYNTHROID, LEVOTHROID) 88 MCG tablet Take 1 tablet (88 mcg total) by mouth daily before breakfast.  . predniSONE (DELTASONE) 10 MG tablet Take 1 tablet (10 mg total) by mouth daily with breakfast.  . Testosterone (ANDROGEL PUMP) 20.25 MG/ACT (1.62%) GEL One pump on each shoulder every morning  . [DISCONTINUED] levothyroxine (SYNTHROID, LEVOTHROID) 88 MCG tablet Take 1 tablet (88 mcg total) by mouth daily before breakfast.  . [DISCONTINUED] predniSONE (DELTASONE) 10 MG tablet TAKE 1 TABLET (10 MG TOTAL) BY MOUTH DAILY WITH BREAKFAST.  . [DISCONTINUED] Testosterone (ANDROGEL PUMP) 20.25 MG/ACT (1.62%) GEL Place 20.25 mg onto the skin daily.   No facility-administered encounter medications on file as of 11/01/2016.    ALLERGIES: No Known Allergies VACCINATION STATUS:  There is no immunization history on file for this patient.  HPI  20 year old male patient with medical history as above. He is being seen in f/u for history of adrenal insufficiency and hypothyroidism requested by Dr. Wendi Snipes. -He has a long  and complicated medical history. 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  -His surgery and treatment for medulloblastoma took place at [facility] in [place]. On subsequent years he was diagnosed with hypothyroidism, hypogonadism, and adrenal insufficiency. He is currently on prednisone 10 mg by mouth daily and levothyroxine 88 g by mouth every morning. - He continues to have ataxia , following up with outpatient physical therapy. He has significant disequilibrium and on and off dizziness and lightheadedness.  He has both motor and sensory aphasia. His grandmother and sister are helping eliciting history. -Reportedly, he never grew any mustache nor beard and never had to shave. -He has never engaged in sexual activity her family's history.  - He is now on AndroGel 20.25 mg pump once every morning for profound secondary hypogonadism.  Review of Systems Constitutional:  Has always been light build, gained 20 pounds since last visit, feels better and stronger, no subjective hyperthermia/hypothermia - He walks with a special walker. Eyes: no blurry vision, no xerophthalmia ENT: no sore throat, no nodules palpated in throat, no dysphagia/odynophagia, no hoarseness Cardiovascular: no CP/SOB/palpitations/leg swelling Respiratory: no cough/SOB Gastrointestinal: no N/V/D/C Musculoskeletal: no muscle/joint aches Skin: no rashes Neurological: no tremors, + complains of numbness of left upper extremity associated with tingling. Marland Kitchen He has on and off lightheadedness and dizziness. Psychiatric: no depression/anxiety  Objective:    BP 110/60  Pulse (!) 120   Resp 18   Ht 5\' 10"  (1.778 m)   Wt 137 lb (62.1 kg)   SpO2 97%   BMI 19.66 kg/m   Wt Readings from Last 3 Encounters:  11/01/16 137 lb (62.1 kg)  06/28/16 118 lb (53.5 kg)  05/31/16 118 lb (53.5 kg)    Physical Exam  Constitutional: chronically sick-looking in NAD,  he ambulates with With the support of his mother, during last visit he was on a specialized walker.  Eyes: PERRLA, EOMI, no exophthalmos ENT: Poor dentition, healed post craniotomy scar on the back of his head, moist mucous membranes, no thyromegaly, no cervical lymphadenopathy Cardiovascular: RRR, No MRG Respiratory: CTA B Gastrointestinal: abdomen soft, NT, ND, BS+ Musculoskeletal: Global loss of skeletal muscles, no deformities, strength 3 out of 5 in all 4 extremities. Skin: moist, warm, no rashes, has poor skin turgor , no mustache no beard. He wanted to defer genital exam. Neurological: no tremor with outstretched hands, DTR normal in all 4   CMP     Component Value Date/Time   NA 137 10/13/2016 0000   K 3.8 10/13/2016 0000   CL 90 (L) 10/13/2016 0000   CO2 32 (H) 10/13/2016 0000   GLUCOSE 60 (L) 10/13/2016 0000   GLUCOSE 96 09/10/2015 0533   BUN 7 10/13/2016 0000   CREATININE 0.84 10/13/2016 0000   CALCIUM 9.9 10/13/2016 0000   PROT 6.8 10/13/2016 0000   ALBUMIN 4.8 10/13/2016 0000   AST 13 10/13/2016 0000   ALT 12 10/13/2016 0000   ALKPHOS 76 10/13/2016 0000   BILITOT <0.2 10/13/2016 0000   GFRNONAA 126 10/13/2016 0000   GFRAA 146 10/13/2016 0000     Assessment & Plan:   1. Adrenal insufficiency ([place]) - He seems to have well settled diagnosis of secondary adrenal insufficiency likely other result of surgery and radiation to his head associated with his history of medulloblastoma of the brainstem. The details of his cancer therapy are not available to review today. -He did Better with increased steroid support. I will  continue  his prednisone  10 mg by mouth every morning. I advised him on sick day rules where he can double his prednisone until the stressors are resolved.  - He will also need DEXA scan on a later date.   2. Other specified hypothyroidism  - His thyroid function tests are within target. I discussed the need for levothyroxine replacement for life  with him. I will continue current dose of levothyroxine at 88 g by mouth every morning.  - We discussed about correct intake of levothyroxine, at fasting, with water, separated by at least 30 minutes from breakfast, and separated by more than 4 hours from calcium, iron, multivitamins, acid reflux medications (PPIs). -Patient is made aware of the fact that thyroid hormone replacement is needed for life, dose to be adjusted by periodic monitoring of thyroid function tests.  -I advised him to continue physical therapy as an outpatient.  -I have extensively counseled him against smoking.  -He will need for medical alert device to wear on his body depicting his history is specially of the need for steroids.  3. Profound hypogonadism: His total testosterone is 35 slightly improving from  14 ! - He will benefit from continuation of  testosterone replacement, this will hopefully help with the muscular deconditioning he is dealing with as well. -I discussed his options. Due to his poor social support, he would not be able to do injectable testosterone. I  prescribed AndroGel 20.25mg  pump   on both shoulders to get a total of 40.5 mg testosterone  Daily in the morning. He will likely require higher doses however this is a safe dose to start with.  - I have instructed is the family to document how much liquids he drinks and how many times he visits the bathroom. This is a patient with at-risk of diabetes insipidus.  - He has missed a scheduled bone density scan with Dr. Wendi Snipes. I advised him to call back and reschedule.   - I advised patient to maintain close follow up with Kenn File, MD for primary care needs. Follow up plan: No Follow-up on file.  Glade Lloyd, MD Phone: [phone]  Fax: [phone]   11/01/2016, 3:45 PM

## 2016-11-02 ENCOUNTER — Encounter: Payer: Medicare Other | Admitting: *Deleted

## 2016-11-07 ENCOUNTER — Encounter: Payer: Medicare Other | Admitting: *Deleted

## 2016-11-09 ENCOUNTER — Ambulatory Visit: Payer: Medicare Other | Admitting: *Deleted

## 2016-11-14 ENCOUNTER — Ambulatory Visit: Payer: Medicare Other | Admitting: *Deleted

## 2016-11-15 DIAGNOSIS — Z923 Personal history of irradiation: Secondary | ICD-10-CM | POA: Diagnosis not present

## 2016-11-15 DIAGNOSIS — M25472 Effusion, left ankle: Secondary | ICD-10-CM | POA: Diagnosis not present

## 2016-11-15 DIAGNOSIS — Z85841 Personal history of malignant neoplasm of brain: Secondary | ICD-10-CM | POA: Diagnosis not present

## 2016-11-15 DIAGNOSIS — M25572 Pain in left ankle and joints of left foot: Secondary | ICD-10-CM | POA: Diagnosis not present

## 2016-11-15 DIAGNOSIS — Z9221 Personal history of antineoplastic chemotherapy: Secondary | ICD-10-CM | POA: Diagnosis not present

## 2016-11-15 DIAGNOSIS — S93402A Sprain of unspecified ligament of left ankle, initial encounter: Secondary | ICD-10-CM | POA: Diagnosis not present

## 2016-11-15 DIAGNOSIS — Z7722 Contact with and (suspected) exposure to environmental tobacco smoke (acute) (chronic): Secondary | ICD-10-CM | POA: Diagnosis not present

## 2016-11-15 DIAGNOSIS — Z8639 Personal history of other endocrine, nutritional and metabolic disease: Secondary | ICD-10-CM | POA: Diagnosis not present

## 2016-11-16 ENCOUNTER — Encounter: Payer: Medicare Other | Admitting: *Deleted

## 2016-11-21 ENCOUNTER — Encounter: Payer: Medicare Other | Admitting: *Deleted

## 2016-11-23 ENCOUNTER — Encounter: Payer: Medicare Other | Admitting: *Deleted

## 2016-11-28 ENCOUNTER — Encounter: Payer: Medicare Other | Admitting: *Deleted

## 2016-11-30 ENCOUNTER — Encounter: Payer: Medicare Other | Admitting: *Deleted

## 2016-12-05 ENCOUNTER — Encounter: Payer: Medicare Other | Admitting: *Deleted

## 2016-12-07 ENCOUNTER — Encounter: Payer: Medicare Other | Admitting: *Deleted

## 2016-12-12 ENCOUNTER — Encounter: Payer: Medicare Other | Admitting: *Deleted

## 2016-12-14 ENCOUNTER — Encounter: Payer: Medicare Other | Admitting: *Deleted

## 2016-12-18 ENCOUNTER — Encounter: Payer: Self-pay | Admitting: Family Medicine

## 2016-12-18 ENCOUNTER — Ambulatory Visit (INDEPENDENT_AMBULATORY_CARE_PROVIDER_SITE_OTHER): Payer: Medicare Other | Admitting: Family Medicine

## 2016-12-18 VITALS — BP 117/81 | HR 94 | Temp 96.9°F | Ht 70.0 in | Wt 137.8 lb

## 2016-12-18 DIAGNOSIS — S93401A Sprain of unspecified ligament of right ankle, initial encounter: Secondary | ICD-10-CM | POA: Diagnosis not present

## 2016-12-18 NOTE — Progress Notes (Signed)
   HPI  Patient presents today for follow-up of sprained ankle.  Patient reports that he was seen at [facility] for right sprained ankle about one month ago. He reports complete resolution and would like a note to clear him to go back to physical therapy.  Patient states that he is walking without pain. He states that he previously had right lateral ankle pain distal to his lateral malleolus.  PMH: Smoking status noted ROS: Per HPI  Objective: BP 117/81   Pulse 94   Temp (!) 96.9 F (36.1 C) (Oral)   Ht 5\' 10"  (1.778 m)   Wt 137 lb 12.8 oz (62.5 kg)   BMI 19.77 kg/m  Gen: NAD, alert, cooperative with exam HEENT: NCAT, EOMI, PERRL CV: RRR, good S1/S2, no murmur Resp: CTABL, no wheezes, non-labored  Ext: No edema, warm Neuro: Alert and oriented MSK:  Tenderness to palpation of any of the ligaments surrounding the right lateral malleolus, no tenderness with plantar or dorsiflexion of the right foot, no tenderness with inversion or eversion  Assessment and plan:  # Right ankle sprain Resolved, note written to return back to physical therapy    Laroy Apple, MD [facility] 12/18/2016, 5:24 PM

## 2016-12-19 ENCOUNTER — Other Ambulatory Visit: Payer: Self-pay | Admitting: Family Medicine

## 2016-12-19 DIAGNOSIS — R29898 Other symptoms and signs involving the musculoskeletal system: Secondary | ICD-10-CM

## 2016-12-19 DIAGNOSIS — R2681 Unsteadiness on feet: Secondary | ICD-10-CM | POA: Insufficient documentation

## 2016-12-26 ENCOUNTER — Ambulatory Visit: Payer: Medicare Other | Attending: Family Medicine | Admitting: Physical Therapy

## 2016-12-26 DIAGNOSIS — R2681 Unsteadiness on feet: Secondary | ICD-10-CM | POA: Diagnosis not present

## 2016-12-26 DIAGNOSIS — R29898 Other symptoms and signs involving the musculoskeletal system: Secondary | ICD-10-CM | POA: Insufficient documentation

## 2016-12-26 DIAGNOSIS — M6281 Muscle weakness (generalized): Secondary | ICD-10-CM

## 2016-12-26 DIAGNOSIS — R269 Unspecified abnormalities of gait and mobility: Secondary | ICD-10-CM | POA: Diagnosis not present

## 2016-12-26 DIAGNOSIS — R260 Ataxic gait: Secondary | ICD-10-CM | POA: Diagnosis not present

## 2016-12-26 NOTE — Therapy (Addendum)
[facility] [place], [place], [address] Phone: [phone]   Fax:  [phone]  Physical Therapy Evaluation  Patient Details  Name: James Hoffman MRN: [id number] Date of Birth: October 05, 1996 Referring Provider: Kenn File MD  Encounter Date: 12/26/2016      PT End of Session - 12/26/16 2051    Visit Number 1   Number of Visits 16   Date for PT Re-Evaluation 02/24/17   PT Start Time 0334   PT Stop Time 0415   PT Time Calculation (min) 41 min   Activity Tolerance Patient tolerated treatment well   Behavior During Therapy [facility] for tasks assessed/performed      Past Medical History:  Diagnosis Date  . Adrenal insufficiency ([place])   . Cancer ([place])    brain tumor on brain stem  . Hydrocephalus   . Thyroid disease     Past Surgical History:  Procedure Laterality Date  . brain tumor removal  August 2012    There were no vitals filed for this visit.       Subjective Assessment - 12/26/16 2057    Subjective The patient presents to OPPT with a diagnosis of gait instability.  He states he has been walking lately with family members with HHA on right.  He is at high risk for falls and it is recommedned he use a walker as he has previously.  Patient hopes to ger stronger and walk better and hopefully independently someday.   Limitations Walking   Patient Stated Goals Get stronger and walk better.   Currently in Pain? No/denies            [facility] PT Assessment - 12/26/16 0001      Assessment   Medical Diagnosis Gait instability.   Referring Provider Kenn File MD   Onset Date/Surgical Date --  21 years of age.     Precautions   Precautions --  HIGH FALL RISK.     Restrictions   Weight Bearing Restrictions No     Balance Screen   Has the patient fallen in the past 6 months No   Has the patient had a decrease in activity level because of a fear of falling?  Yes   Is the patient reluctant to  leave their home because of a fear of falling?  No     Home Ecologist residence     Prior Function   Level of Independence Independent with community mobility with device     Coordination   Gross Motor Movements are Fluid and Coordinated No   Heel Shin Test --  WFL's.     Posture/Postural Control   Posture/Postural Control Postural limitations   Postural Limitations Rounded Shoulders;Forward head;Decreased lumbar lordosis   Posture Comments --  Left genu recurvatum.     ROM / Strength   AROM / PROM / Strength AROM;Strength     AROM   Overall AROM Comments Bilateral heel cored tightness observed.     Strength   Overall Strength Comments Left hip abduction and flexion= 4-5; left knee ext and flexion= 4/5; left ankle= 3+ to 4-/5.  Right hip flexion= 4+/5; abd= 4-/5; right knee strength= 5/5; right ankle= 4/5.   Strength Assessment Site Hip;Knee;Ankle   Right/Left Hip Left   Left Hip Flexion 4-/5   Left Hip ABduction 4-/5     Special Tests    Special Tests --  (+)Romberg test.     Transfers  Transfers Sit to Stand;Supine to Sit   Sit to Stand 4: Min guard   Supine to Sit 6: Modified independent (Device/Increase time)     Ambulation/Gait   Gait Pattern Decreased arm swing - right;Decreased arm swing - left;Decreased step length - right;Decreased step length - left;Decreased stride length;Decreased hip/knee flexion - right;Decreased hip/knee flexion - left;Decreased dorsiflexion - right;Decreased dorsiflexion - left;Left genu recurvatum;Shuffle;Scissoring;Ataxic;Trunk flexed;Poor foot clearance - left;Poor foot clearance - right  HHA provided on right.     Standardized Balance Assessment   Standardized Balance Assessment Berg Balance Test     Berg Balance Test   Sit to Stand Able to stand  independently using hands   Standing Unsupported Unable to stand 30 seconds unassisted   Sitting with Back Unsupported but Feet Supported on Floor or  Stool Able to sit 2 minutes under supervision   Stand to Sit Controls descent by using hands   Transfers Able to transfer with verbal cueing and /or supervision   Standing Unsupported with Eyes Closed Needs help to keep from falling   Standing Ubsupported with Feet Together Needs help to attain position and unable to hold for 15 seconds   From Standing, Reach Forward with Outstretched Arm Reaches forward but needs supervision   From Standing Position, Pick up Object from Floor Unable to try/needs assist to keep balance   From Standing Position, Turn to Look Behind Over each Shoulder Needs assist to keep from losing balance and falling   Turn 360 Degrees Needs assistance while turning   Standing Unsupported, Alternately Place Feet on Step/Stool Able to complete >2 steps/needs minimal assist   Standing Unsupported, One Foot in Front Loses balance while stepping or standing   Standing on One Leg Unable to try or needs assist to prevent fall   Total Score 13                   [facility] Adult PT Treatment/Exercise - 12/26/16 0001      Exercises   Exercises Knee/Hip     Knee/Hip Exercises: Aerobic   Nustep Level 3 x 9 minutes.  02 sats stayed WNL's.                  PT Short Term Goals - 12/26/16 2117      PT SHORT TERM GOAL #1   Title Ind with an initial HEP.   Time 2   Period Weeks   Status New           PT Long Term Goals - 12/26/16 2117      PT LONG TERM GOAL #1   Title Ind with an advanced HEP.   Time 8   Period Weeks   Status New     PT LONG TERM GOAL #2   Time 8   Period Weeks   Status New     PT LONG TERM GOAL #3   Title Sit to stand x 5 with SBA.   Time 8   Period Weeks     PT LONG TERM GOAL #4   Title Walk in clinic with CGA/SBA with a cane 500 feet.   Time 8   Period Weeks   Status New     PT LONG TERM GOAL #5   Title Improve Berg score by 10-12 points.   Time 8   Period Weeks   Status New               Plan - 12/26/16  2114  Clinical Impression Statement The patient is at high risk for falls and score a 13/56 on a Berg test.  Additionally he demonstrates a positive Romberg test.  He has significant LE weakness and his gait is very ataxic in nature.  Patient would benefit from skilled PT to include gait and balance training and LE strengthening.   Rehab Potential Fair   PT Frequency 2x / week   PT Treatment/Interventions ADLs/Self Care Home Management;Functional mobility training;Stair training;Gait training;Therapeutic activities;Therapeutic exercise;Balance training;Neuromuscular re-education;Patient/family education   PT Next Visit Plan gait/balance activites and bilateral LE strengthning exercises.      Patient will benefit from skilled therapeutic intervention in order to improve the following deficits and impairments:  Abnormal gait, Decreased activity tolerance, Decreased strength, Decreased coordination, Decreased balance  Visit Diagnosis: Unsteadiness on feet - Plan: PT plan of care cert/re-cert  Muscle weakness (generalized) - Plan: PT plan of care cert/re-cert     Problem List Patient Active Problem List   Diagnosis Date Noted  . Gait instability 12/19/2016  . Hypogonadism, male 07/19/2016  . Normocytic anemia 04/14/2016  . Muscular deconditioning 09/17/2015  . Other specified hypothyroidism 09/09/2015  . Medullary carcinoma ([place]) 09/09/2015  . Adrenal insufficiency ([place]) 09/09/2015  . Hypotension 09/09/2015  . Hyponatremia 09/09/2015  . Hypokalemia 09/09/2015    Shirah Roseman, [place] MPT 12/26/2016, 9:21 PM  [facility] [place], [place], [address] Phone: [phone]   Fax:  [phone]  Name: James Hoffman MRN: [id number] Date of Birth: 06/01/1996 PHYSICAL THERAPY DISCHARGE SUMMARY  Visits from Start of Care: 4.  Current functional level related to goals / functional outcomes: See above.   Remaining  deficits: Patient did not return to PT.   Education / Equipment: HEP.  Plan: Patient agrees to discharge.  Patient goals were not met. Patient is being discharged due to not returning since the last visit.  ?????         Mali Abbey Veith MPT

## 2016-12-28 ENCOUNTER — Ambulatory Visit: Payer: Medicare Other | Admitting: *Deleted

## 2016-12-28 DIAGNOSIS — M6281 Muscle weakness (generalized): Secondary | ICD-10-CM

## 2016-12-28 DIAGNOSIS — R2681 Unsteadiness on feet: Secondary | ICD-10-CM | POA: Diagnosis not present

## 2016-12-28 DIAGNOSIS — R260 Ataxic gait: Secondary | ICD-10-CM

## 2016-12-28 DIAGNOSIS — R269 Unspecified abnormalities of gait and mobility: Secondary | ICD-10-CM | POA: Diagnosis not present

## 2016-12-28 DIAGNOSIS — R29898 Other symptoms and signs involving the musculoskeletal system: Secondary | ICD-10-CM | POA: Diagnosis not present

## 2016-12-28 NOTE — Therapy (Signed)
[facility] [place], [place], [address] Phone: [phone]   Fax:  [phone]  Physical Therapy Treatment  Patient Details  Name: James Hoffman MRN: [id number] Date of Birth: [birth year] Referring Provider: Kenn File MD  Encounter Date: 12/28/2016      PT End of Session - 12/28/16 1603    Visit Number 2   Number of Visits 16   Date for PT Re-Evaluation 02/24/17   PT Start Time [id number]   PT Stop Time 1606   PT Time Calculation (min) 51 min      Past Medical History:  Diagnosis Date  . Adrenal insufficiency ([place])   . Cancer ([place])    brain tumor on brain stem  . Hydrocephalus   . Thyroid disease     Past Surgical History:  Procedure Laterality Date  . brain tumor removal  August 2012    There were no vitals filed for this visit.      Subjective Assessment - 12/28/16 1531    Subjective The patient presents to OPPT with a diagnosis of gait instability.  He states he has been walking lately with family members with HHA on right.  He is at high risk for falls and it is recommedned he use a walker as he has previously.  Patient hopes to ger stronger and walk better and hopefully independently someday.   Limitations Walking   Patient Stated Goals Get stronger and walk better.   Currently in Pain? No/denies                         [facility] Adult PT Treatment/Exercise - 12/28/16 0001      Ambulation/Gait   Gait Pattern Decreased arm swing - right;Decreased arm swing - left;Decreased step length - right;Decreased step length - left;Decreased stride length;Decreased hip/knee flexion - right;Decreased hip/knee flexion - left;Decreased dorsiflexion - right;Decreased dorsiflexion - left;Left genu recurvatum;Shuffle;Scissoring;Ataxic;Trunk flexed;Poor foot clearance - left;Poor foot clearance - right   Gait Comments Gait in clinic with [facility] and CGA x 250 feet.  LOB x 5 with manual assistance to  correctt. V/Cs to slow down     Exercises   Exercises Knee/Hip     Knee/Hip Exercises: Aerobic   Nustep Level 6-7 x 15 minutes.  02 sats stayed WNL's.     Knee/Hip Exercises: Machines for Strengthening   Cybex Knee Extension 10#s 5 x10   Cybex Knee Flexion 30# 5x10     Knee/Hip Exercises: Standing   Rocker Board 5 minutes  DF/PF and calf stretching                  PT Short Term Goals - 12/26/16 2117      PT SHORT TERM GOAL #1   Title Ind with an initial HEP.   Time 2   Period Weeks   Status New           PT Long Term Goals - 12/26/16 2117      PT LONG TERM GOAL #1   Title Ind with an advanced HEP.   Time 8   Period Weeks   Status New     PT LONG TERM GOAL #2   Time 8   Period Weeks   Status New     PT LONG TERM GOAL #3   Title Sit to stand x 5 with SBA.   Time 8   Period Weeks     PT LONG TERM GOAL #4  Title Walk in clinic with CGA/SBA with a cane 500 feet.   Time 8   Period Weeks   Status New     PT LONG TERM GOAL #5   Title Improve Berg score by 10-12 points.   Time 8   Period Weeks   Status New               Plan - 12/28/16 1606    Clinical Impression Statement Pt did fairly well with Rx today. He was able to ambulate wit [facility] in clinic with CGA, but had ataxic gait pattern and 5 LOBs that needed manual assistance to correct. He did was able to complete strengthening exs fairly well, but had some Bil. calf cramping. LTGs ongpoing only 2nd visit   Rehab Potential Fair   PT Frequency 2x / week   PT Treatment/Interventions ADLs/Self Care Home Management;Functional mobility training;Stair training;Gait training;Therapeutic activities;Therapeutic exercise;Balance training;Neuromuscular re-education;Patient/family education   PT Next Visit Plan gait/balance activites and bilateral LE strengthning exercises.   Consulted and Agree with Plan of Care Patient      Patient will benefit from skilled therapeutic intervention in order to  improve the following deficits and impairments:  Abnormal gait, Decreased activity tolerance, Decreased strength, Decreased coordination, Decreased balance  Visit Diagnosis: Unsteadiness on feet  Muscle weakness (generalized)  Ataxic gait     Problem List Patient Active Problem List   Diagnosis Date Noted  . Gait instability 12/19/2016  . Hypogonadism, male 07/19/2016  . Normocytic anemia 04/14/2016  . Muscular deconditioning 09/17/2015  . Other specified hypothyroidism 09/09/2015  . Medullary carcinoma ([place]) 09/09/2015  . Adrenal insufficiency ([place]) 09/09/2015  . Hypotension 09/09/2015  . Hyponatremia 09/09/2015  . Hypokalemia 09/09/2015    James Hoffman,James Hoffman, PTA 12/28/2016, 5:08 PM  [facility] [place], [place], [address] Phone: [phone]   Fax:  [phone]  Name: James Hoffman MRN: [id number] Date of Birth: 12-Mar-1996

## 2017-01-02 ENCOUNTER — Ambulatory Visit: Payer: Medicare Other | Admitting: *Deleted

## 2017-01-02 DIAGNOSIS — R260 Ataxic gait: Secondary | ICD-10-CM | POA: Diagnosis not present

## 2017-01-02 DIAGNOSIS — R29898 Other symptoms and signs involving the musculoskeletal system: Secondary | ICD-10-CM | POA: Diagnosis not present

## 2017-01-02 DIAGNOSIS — R2681 Unsteadiness on feet: Secondary | ICD-10-CM | POA: Diagnosis not present

## 2017-01-02 DIAGNOSIS — R269 Unspecified abnormalities of gait and mobility: Secondary | ICD-10-CM

## 2017-01-02 DIAGNOSIS — M6281 Muscle weakness (generalized): Secondary | ICD-10-CM

## 2017-01-02 NOTE — Therapy (Signed)
[facility] [place], [place], [address] Phone: [phone]   Fax:  [phone]  Physical Therapy Treatment  Patient Details  Name: James Hoffman MRN: [id number] Date of Birth: [birth year] Referring Provider: Kenn File MD  Encounter Date: 01/02/2017      PT End of Session - 01/02/17 1553    Visit Number 3   Number of Visits 16   Date for PT Re-Evaluation 02/24/17   PT Start Time [id number]   PT Stop Time 1607   PT Time Calculation (min) 52 min      Past Medical History:  Diagnosis Date  . Adrenal insufficiency ([place])   . 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 Hydrocephalus   . Thyroid disease     Past Surgical History:  Procedure Laterality Date  . brain tumor removal  August 2012    There were no vitals filed for this visit.      Subjective Assessment - 01/02/17 1551    Subjective The patient presents to OPPT with a diagnosis of gait instability.  He states he has been walking lately with family members with HHA on right.  He is at high risk for falls and it is recommedned he use a walker as he has previously.  Patient hopes to ger stronger and walk better and hopefully independently someday.   Limitations Walking   Patient Stated Goals Get stronger and walk better.   Currently in Pain? No/denies                         [facility] Adult PT Treatment/Exercise - 01/02/17 0001      Transfers   Transfers Sit to Stand;Supine to Sit   Sit to Stand 4: Min guard   Supine to Sit 6: Modified independent (Device/Increase time)     Ambulation/Gait   Gait Pattern Decreased arm swing - right;Decreased arm swing - left;Decreased step length - right;Decreased step length - left;Decreased stride length;Decreased hip/knee flexion - right;Decreased hip/knee flexion - left;Decreased dorsiflexion - right;Decreased dorsiflexion - left;Left genu recurvatum;Shuffle;Scissoring;Ataxic;Trunk flexed;Poor foot  clearance - left;Poor foot clearance - right   Gait Comments Gait in clinic with [facility] and CGA x 300 feet.  LOB x 6 with manual assistance to correctt x 4. V/Cs to slow down     Exercises   Exercises Knee/Hip     Knee/Hip Exercises: Aerobic   Nustep Level 6-7 x 15 minutes.  02 sats stayed WNL's.     Knee/Hip Exercises: Machines for Strengthening   Cybex Knee Extension 20#s 5 x10   Cybex Knee Flexion 40# 5x10     Knee/Hip Exercises: Seated   Sit to Sand 10 reps  with SBA/CGA  balance after standing x2 assistance to gain b                  PT Short Term Goals - 12/26/16 2117      PT SHORT TERM GOAL #1   Title Ind with an initial HEP.   Time 2   Period Weeks   Status New           PT Long Term Goals - 12/26/16 2117      PT LONG TERM GOAL #1   Title Ind with an advanced HEP.   Time 8   Period Weeks   Status New     PT LONG TERM GOAL #2   Time 8   Period Weeks  Status New     PT LONG TERM GOAL #3   Title Sit to stand x 5 with SBA.   Time 8   Period Weeks     PT LONG TERM GOAL #4   Title Walk in clinic with CGA/SBA with a cane 500 feet.   Time 8   Period Weeks   Status New     PT LONG TERM GOAL #5   Title Improve Berg score by 10-12 points.   Time 8   Period Weeks   Status New               Plan - 01/02/17 1553    Clinical Impression Statement Pt arrived today feeling a little better and was able to perform more balance activities. He was able to ambulate with [facility] and CGA with  6 LOBs and 4 that needed manual assistance to correct. Pt loses his balance when he tries to increase his gait speed and needs cues to slow down.    Rehab Potential Fair   PT Frequency 2x / week   PT Treatment/Interventions ADLs/Self Care Home Management;Functional mobility training;Stair training;Gait training;Therapeutic activities;Therapeutic exercise;Balance training;Neuromuscular re-education;Patient/family education   PT Next Visit Plan gait/balance  activites and bilateral LE strengthning exercises.   Consulted and Agree with Plan of Care Patient      Patient will benefit from skilled therapeutic intervention in order to improve the following deficits and impairments:  Abnormal gait, Decreased activity tolerance, Decreased strength, Decreased coordination, Decreased balance  Visit Diagnosis: Unsteadiness on feet  Muscle weakness (generalized)  Ataxic gait  Unsteadiness  Abnormality of gait  Weakness of both lower extremities     Problem List Patient Active Problem List   Diagnosis Date Noted  . Gait instability 12/19/2016  . Hypogonadism, male 07/19/2016  . Normocytic anemia 04/14/2016  . Muscular deconditioning 09/17/2015  . Other specified hypothyroidism 09/09/2015  . Medullary carcinoma ([place]) 09/09/2015  . Adrenal insufficiency ([place]) 09/09/2015  . Hypotension 09/09/2015  . Hyponatremia 09/09/2015  . Hypokalemia 09/09/2015    RAMSEUR,[name], PTA 01/02/2017, 5:54 PM  [facility] [address] [place], [place], [address] Phone: [phone]   Fax:  [phone]  Name: James Hoffman MRN: [id number] Date of Birth: 10/29/1996

## 2017-01-04 ENCOUNTER — Ambulatory Visit: Payer: Medicare Other | Admitting: *Deleted

## 2017-01-04 DIAGNOSIS — R269 Unspecified abnormalities of gait and mobility: Secondary | ICD-10-CM | POA: Diagnosis not present

## 2017-01-04 DIAGNOSIS — R260 Ataxic gait: Secondary | ICD-10-CM

## 2017-01-04 DIAGNOSIS — M6281 Muscle weakness (generalized): Secondary | ICD-10-CM | POA: Diagnosis not present

## 2017-01-04 DIAGNOSIS — R2681 Unsteadiness on feet: Secondary | ICD-10-CM | POA: Diagnosis not present

## 2017-01-04 DIAGNOSIS — R29898 Other symptoms and signs involving the musculoskeletal system: Secondary | ICD-10-CM | POA: Diagnosis not present

## 2017-01-04 NOTE — Therapy (Signed)
[facility] [place], [place], [address] Phone: [phone]   Fax:  [phone]  Physical Therapy Treatment  Patient Details  Name: James Hoffman MRN: [id number] Date of Birth: 1995-11-24 Referring Provider: Kenn File MD  Encounter Date: 01/04/2017      PT End of Session - 01/04/17 1547    Visit Number 4   Number of Visits 16   Date for PT Re-Evaluation 02/24/17   PT Start Time 3244   PT Stop Time 1600   PT Time Calculation (min) 29 min      Past Medical History:  Diagnosis Date  . Adrenal insufficiency ([place])   . 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 Hydrocephalus   . Thyroid disease     Past Surgical History:  Procedure Laterality Date  . brain tumor removal  August 2012    There were no vitals filed for this visit.      Subjective Assessment - 01/04/17 1545    Subjective I did a lot of walking today with walker at home and am tired.   Limitations Walking   Patient Stated Goals Get stronger and walk better.   Currently in Pain? No/denies                         [facility] Adult PT Treatment/Exercise - 01/04/17 0001      Exercises   Exercises Knee/Hip     Knee/Hip Exercises: Aerobic   Nustep Level 5 x 12 minutes.  02 sats stayed WNL's. 2 short rest periods today     Knee/Hip Exercises: Machines for Strengthening   Cybex Knee Extension 10#s 2 x10   Cybex Knee Flexion 20# 2x10                  PT Short Term Goals - 12/26/16 2117      PT SHORT TERM GOAL #1   Title Ind with an initial HEP.   Time 2   Period Weeks   Status New           PT Long Term Goals - 01/04/17 1547      PT LONG TERM GOAL #1   Title Ind with an advanced HEP.   Baseline No knowledge of appropriate ther ex.   Time 8   Period Weeks   Status On-going     PT LONG TERM GOAL #2   Title Increase bilateral LE strength to a solid 4+/5.   Baseline Bilateral LE strength grades  from 3- to 4-/5.   Time 8   Period Weeks   Status On-going     PT LONG TERM GOAL #3   Title Sit to stand x 5 with SBA.   Baseline Up to moderate assist required today.   Time 8   Period Weeks   Status Achieved     PT LONG TERM GOAL #4   Title Walk in clinic with CGA/SBA with a cane 500 feet.   Baseline Patient requires HHA to ambulate safely.   Time 8   Period Weeks   Status On-going     PT LONG TERM GOAL #5   Title Improve Berg score by 10-12 points.   Time 8   Period Weeks   Status On-going               Plan - 01/04/17 1552    Clinical Impression Statement Pt arrived to clinic today already tired  due to walking more than usual today. He wasn't able to perform as well today with Therex and was unable to perform Berg due to LE fatigue. He also reports Both shldrs being sore due using walker so much. Pt was only able to perform 12 mins on nustep and some LE therex and decided he didn't want to do anything else today. No LTGs met today due to fatigue and balance deficits.   Rehab Potential Fair   PT Frequency 2x / week   PT Treatment/Interventions ADLs/Self Care Home Management;Functional mobility training;Stair training;Gait training;Therapeutic activities;Therapeutic exercise;Balance training;Neuromuscular re-education;Patient/family education   PT Next Visit Plan gait/balance activites and bilateral LE strengthning exercises.   Consulted and Agree with Plan of Care Patient      Patient will benefit from skilled therapeutic intervention in order to improve the following deficits and impairments:  Abnormal gait, Decreased activity tolerance, Decreased strength, Decreased coordination, Decreased balance  Visit Diagnosis: Unsteadiness on feet  Muscle weakness (generalized)  Ataxic gait     Problem List Patient Active Problem List   Diagnosis Date Noted  . Gait instability 12/19/2016  . Hypogonadism, male 07/19/2016  . Normocytic anemia 04/14/2016  .  Muscular deconditioning 09/17/2015  . Other specified hypothyroidism 09/09/2015  . Medullary carcinoma ([place]) 09/09/2015  . Adrenal insufficiency ([place]) 09/09/2015  . Hypotension 09/09/2015  . Hyponatremia 09/09/2015  . Hypokalemia 09/09/2015    RAMSEUR,[name], PTA 01/04/2017, 4:20 PM  [facility] [place], [place], [address] Phone: [phone]   Fax:  [phone]  Name: James Hoffman MRN: [id number] Date of Birth: 14-Sep-1996

## 2017-01-04 NOTE — Therapy (Signed)
[facility] [place], [place], [address] Phone: [phone]   Fax:  [phone]  Physical Therapy Treatment  Patient Details  Name: James Hoffman MRN: [id number] Date of Birth: 1995-11-24 Referring Provider: Kenn File MD  Encounter Date: 01/04/2017      PT End of Session - 01/04/17 1547    Visit Number 4   Number of Visits 16   Date for PT Re-Evaluation 02/24/17   PT Start Time 3244   PT Stop Time 1600   PT Time Calculation (min) 29 min      Past Medical History:  Diagnosis Date  . Adrenal insufficiency ([place])   . 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 Hydrocephalus   . Thyroid disease     Past Surgical History:  Procedure Laterality Date  . brain tumor removal  August 2012    There were no vitals filed for this visit.      Subjective Assessment - 01/04/17 1545    Subjective I did a lot of walking today with walker at home and am tired.   Limitations Walking   Patient Stated Goals Get stronger and walk better.   Currently in Pain? No/denies                         [facility] Adult PT Treatment/Exercise - 01/04/17 0001      Exercises   Exercises Knee/Hip     Knee/Hip Exercises: Aerobic   Nustep Level 5 x 12 minutes.  02 sats stayed WNL's. 2 short rest periods today     Knee/Hip Exercises: Machines for Strengthening   Cybex Knee Extension 10#s 2 x10   Cybex Knee Flexion 20# 2x10                  PT Short Term Goals - 12/26/16 2117      PT SHORT TERM GOAL #1   Title Ind with an initial HEP.   Time 2   Period Weeks   Status New           PT Long Term Goals - 01/04/17 1547      PT LONG TERM GOAL #1   Title Ind with an advanced HEP.   Baseline No knowledge of appropriate ther ex.   Time 8   Period Weeks   Status On-going     PT LONG TERM GOAL #2   Title Increase bilateral LE strength to a solid 4+/5.   Baseline Bilateral LE strength grades  from 3- to 4-/5.   Time 8   Period Weeks   Status On-going     PT LONG TERM GOAL #3   Title Sit to stand x 5 with SBA.   Baseline Up to moderate assist required today.   Time 8   Period Weeks   Status Achieved     PT LONG TERM GOAL #4   Title Walk in clinic with CGA/SBA with a cane 500 feet.   Baseline Patient requires HHA to ambulate safely.   Time 8   Period Weeks   Status On-going     PT LONG TERM GOAL #5   Title Improve Berg score by 10-12 points.   Time 8   Period Weeks   Status On-going               Plan - 01/04/17 1552    Clinical Impression Statement Pt arrived to clinic today already tired  due to walking more than usual today. He wasn't able to perform as well today with Therex and was unable to perform Berg due to LE fatigue. He also reports Both shldrs being sore due using walker so much. Pt was only able to perform 12 mins on nustep and some LE therex and decided he didn't want to do anything else today. No LTGs met today due to fatigue and balance deficits.   Rehab Potential Fair   PT Frequency 2x / week   PT Treatment/Interventions ADLs/Self Care Home Management;Functional mobility training;Stair training;Gait training;Therapeutic activities;Therapeutic exercise;Balance training;Neuromuscular re-education;Patient/family education   PT Next Visit Plan gait/balance activites and bilateral LE strengthning exercises.   Consulted and Agree with Plan of Care Patient      Patient will benefit from skilled therapeutic intervention in order to improve the following deficits and impairments:  Abnormal gait, Decreased activity tolerance, Decreased strength, Decreased coordination, Decreased balance  Visit Diagnosis: Unsteadiness on feet  Muscle weakness (generalized)  Ataxic gait     Problem List Patient Active Problem List   Diagnosis Date Noted  . Gait instability 12/19/2016  . Hypogonadism, male 07/19/2016  . Normocytic anemia 04/14/2016  .  Muscular deconditioning 09/17/2015  . Other specified hypothyroidism 09/09/2015  . Medullary carcinoma ([place]) 09/09/2015  . Adrenal insufficiency ([place]) 09/09/2015  . Hypotension 09/09/2015  . Hyponatremia 09/09/2015  . Hypokalemia 09/09/2015    RAMSEUR,[name], PTA 01/04/2017, 5:11 PM  [facility] [place], [place], [address] Phone: [phone]   Fax:  [phone]  Name: James Hoffman MRN: [id number] Date of Birth: [birth year]

## 2017-01-04 NOTE — Therapy (Addendum)
[facility] [place], [place], [address] Phone: [phone]   Fax:  [phone]  Physical Therapy Treatment  Patient Details  Name: James Hoffman MRN: [id number] Date of Birth: 21-Jun-1996 Referring Provider: Kenn File MD.  Encounter Date: 01/04/2017    Past Medical History:  Diagnosis Date  . Adrenal insufficiency ([place])   . 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 Hydrocephalus   . Thyroid disease     Past Surgical History:  Procedure Laterality Date  . brain tumor removal  August 2012    There were no vitals filed for this visit.                                 PT Short Term Goals - 06/25/17 1712      PT SHORT TERM GOAL #1   Title STG's=LTG's.           PT Long Term Goals - 06/25/17 1712      PT LONG TERM GOAL #1   Title Ind with an advanced HEP.   Time 8   Period Weeks   Status New     PT LONG TERM GOAL #2   Title Increase bilateral LE strength to a solid 4+/5.   Time 8   Period Weeks   Status New     PT LONG TERM GOAL #3   Title Sit to stand x 5 with SBA.   Time 8   Period Weeks   Status New     PT LONG TERM GOAL #4   Title Walk in clinic with CGA/SBA with a cane 500 feet.   Time 8   Period Weeks   Status New             Patient will benefit from skilled therapeutic intervention in order to improve the following deficits and impairments:  Abnormal gait, Decreased activity tolerance, Decreased strength, Decreased coordination, Decreased balance  Visit Diagnosis: Unsteadiness on feet  Muscle weakness (generalized)  Ataxic gait     Problem List Patient Active Problem List   Diagnosis Date Noted  . Gait instability 12/19/2016  . Hypogonadism, male 07/19/2016  . Normocytic anemia 04/14/2016  . Muscular deconditioning 09/17/2015  . Other specified hypothyroidism 09/09/2015  . Medullary carcinoma ([place]) 09/09/2015  . Adrenal  insufficiency ([place]) 09/09/2015  . Hypotension 09/09/2015  . Hyponatremia 09/09/2015  . Hypokalemia 09/09/2015    APPLEGATE, [place] 07/17/2017, 11:51 AM  [facility] [place], [place], [address] Phone: [phone]   Fax:  [phone]  Name: James Hoffman MRN: [id number] Date of Birth: 1996-01-22  PHYSICAL THERAPY DISCHARGE SUMMARY  Visits from Start of Care: 4.  Current functional level related to goals / functional outcomes: See above.   Remaining deficits: See above.  Education / Equipment: HEP. Plan: Patient agrees to discharge.  Patient goals were not met. Patient is being discharged due to not returning since the last visit.  ?????         Mali Applegate MPT

## 2017-01-08 ENCOUNTER — Ambulatory Visit (INDEPENDENT_AMBULATORY_CARE_PROVIDER_SITE_OTHER): Payer: Medicare Other | Admitting: Family Medicine

## 2017-01-08 ENCOUNTER — Encounter: Payer: Self-pay | Admitting: Family Medicine

## 2017-01-08 VITALS — BP 83/51 | HR 132

## 2017-01-08 DIAGNOSIS — J019 Acute sinusitis, unspecified: Secondary | ICD-10-CM | POA: Diagnosis not present

## 2017-01-08 DIAGNOSIS — J101 Influenza due to other identified influenza virus with other respiratory manifestations: Secondary | ICD-10-CM | POA: Diagnosis not present

## 2017-01-08 DIAGNOSIS — R509 Fever, unspecified: Secondary | ICD-10-CM | POA: Diagnosis not present

## 2017-01-08 DIAGNOSIS — N189 Chronic kidney disease, unspecified: Secondary | ICD-10-CM | POA: Diagnosis present

## 2017-01-08 DIAGNOSIS — Z923 Personal history of irradiation: Secondary | ICD-10-CM | POA: Diagnosis not present

## 2017-01-08 DIAGNOSIS — N179 Acute kidney failure, unspecified: Secondary | ICD-10-CM | POA: Diagnosis not present

## 2017-01-08 DIAGNOSIS — Z7722 Contact with and (suspected) exposure to environmental tobacco smoke (acute) (chronic): Secondary | ICD-10-CM | POA: Diagnosis not present

## 2017-01-08 DIAGNOSIS — R51 Headache: Secondary | ICD-10-CM | POA: Diagnosis not present

## 2017-01-08 DIAGNOSIS — J111 Influenza due to unidentified influenza virus with other respiratory manifestations: Secondary | ICD-10-CM | POA: Diagnosis not present

## 2017-01-08 DIAGNOSIS — E039 Hypothyroidism, unspecified: Secondary | ICD-10-CM | POA: Diagnosis not present

## 2017-01-08 DIAGNOSIS — R55 Syncope and collapse: Secondary | ICD-10-CM | POA: Diagnosis not present

## 2017-01-08 DIAGNOSIS — I959 Hypotension, unspecified: Secondary | ICD-10-CM | POA: Diagnosis not present

## 2017-01-08 DIAGNOSIS — Z9221 Personal history of antineoplastic chemotherapy: Secondary | ICD-10-CM | POA: Diagnosis not present

## 2017-01-08 DIAGNOSIS — R0902 Hypoxemia: Secondary | ICD-10-CM

## 2017-01-08 DIAGNOSIS — E860 Dehydration: Secondary | ICD-10-CM | POA: Diagnosis not present

## 2017-01-08 DIAGNOSIS — R6250 Unspecified lack of expected normal physiological development in childhood: Secondary | ICD-10-CM | POA: Diagnosis present

## 2017-01-08 DIAGNOSIS — Z982 Presence of cerebrospinal fluid drainage device: Secondary | ICD-10-CM | POA: Diagnosis not present

## 2017-01-08 DIAGNOSIS — R6521 Severe sepsis with septic shock: Secondary | ICD-10-CM | POA: Diagnosis not present

## 2017-01-08 DIAGNOSIS — R410 Disorientation, unspecified: Secondary | ICD-10-CM

## 2017-01-08 DIAGNOSIS — E2749 Other adrenocortical insufficiency: Secondary | ICD-10-CM | POA: Diagnosis not present

## 2017-01-08 DIAGNOSIS — R0602 Shortness of breath: Secondary | ICD-10-CM | POA: Diagnosis not present

## 2017-01-08 DIAGNOSIS — E2740 Unspecified adrenocortical insufficiency: Secondary | ICD-10-CM | POA: Diagnosis not present

## 2017-01-08 DIAGNOSIS — I9589 Other hypotension: Secondary | ICD-10-CM | POA: Diagnosis not present

## 2017-01-08 DIAGNOSIS — A419 Sepsis, unspecified organism: Secondary | ICD-10-CM | POA: Diagnosis not present

## 2017-01-08 DIAGNOSIS — G8194 Hemiplegia, unspecified affecting left nondominant side: Secondary | ICD-10-CM | POA: Diagnosis not present

## 2017-01-08 DIAGNOSIS — R531 Weakness: Secondary | ICD-10-CM | POA: Diagnosis not present

## 2017-01-08 DIAGNOSIS — Z7952 Long term (current) use of systemic steroids: Secondary | ICD-10-CM | POA: Diagnosis not present

## 2017-01-08 DIAGNOSIS — F0390 Unspecified dementia without behavioral disturbance: Secondary | ICD-10-CM | POA: Diagnosis present

## 2017-01-08 DIAGNOSIS — I482 Chronic atrial fibrillation: Secondary | ICD-10-CM | POA: Diagnosis not present

## 2017-01-08 DIAGNOSIS — R061 Stridor: Secondary | ICD-10-CM | POA: Diagnosis not present

## 2017-01-08 DIAGNOSIS — M791 Myalgia: Secondary | ICD-10-CM | POA: Diagnosis present

## 2017-01-08 DIAGNOSIS — Z85841 Personal history of malignant neoplasm of brain: Secondary | ICD-10-CM | POA: Diagnosis not present

## 2017-01-08 DIAGNOSIS — E871 Hypo-osmolality and hyponatremia: Secondary | ICD-10-CM | POA: Diagnosis not present

## 2017-01-08 LAB — VERITOR FLU A/B WAIVED
INFLUENZA A: POSITIVE — AB
INFLUENZA B: NEGATIVE

## 2017-01-08 LAB — GLUCOSE HEMOCUE WAIVED: Glu Hemocue Waived: 88 mg/dL (ref 65–99)

## 2017-01-08 NOTE — Progress Notes (Signed)
Subjective:  Patient ID: ZAEDON PASSANISI, male    DOB: [birth year]  Age: 21 y.o. MRN: [id number]  CC: Headache (FLU LIKE, WEk, low bp)   HPI Was called to see patient emergently due to being unresponsive. He presented with 2 days of headache. He has history of frequent headaches and a history of brain tumor with delayed development. He had the tumor when he was 69. He is now 20 and cannot walk due to left sided weakness. He has not been eating or drinking for the last day or 2. He has had some cough but not profuse amounts. It's been dry. No fever chills or sweats. Upon presentation was noted to have a hypotensive blood pressure. His pulse ox reading was 84%. He was immediately placed on oxygen and lay down in Trendelenburg. When on oxygen his pulse ox came up to 97% and he became more responsive. The EMT squad was called and arrived and took over care of the patient before and IV be placed. Normal saline had been ordered. The patient was awake and somewhat alert able to tell us his name prior to leaving in route to [place] flu swab was obtained which was positive and that result was called to [facility] E.D. triage nurse   History Cozy has a past medical history of Adrenal insufficiency ([place]); Cancer ([place]); Hydrocephalus; and Thyroid disease.   He has a past surgical history that includes brain tumor removal (August 2012).   His family history is not on file.He reports that he has been smoking Cigarettes.  He started smoking about 4 years ago. He has been smoking about 0.50 packs per day. He has never used smokeless tobacco. He reports that he does not drink alcohol or use drugs.    ROS Review of Systems  Unable to perform ROS: Acuity of condition    Objective:  BP (!) 83/51   Pulse (!) 132   SpO2 (!) 84%   BP Readings from Last 3 Encounters:  01/08/17 (!) 83/51  12/18/16 117/81  11/01/16 110/60    Wt Readings from Last 3 Encounters:  12/18/16 137 lb  12.8 oz (62.5 kg)  11/01/16 137 lb (62.1 kg)  06/28/16 118 lb (53.5 kg)     Physical Exam  Constitutional: He appears well-developed. He appears distressed.  HENT:  Head: Normocephalic.  Right Ear: Tympanic membrane and ear canal normal.  Left Ear: Tympanic membrane and ear canal normal.  Eyes: Conjunctivae and EOM are normal. Pupils are equal, round, and reactive to light.  Neck: Normal range of motion. Neck supple.  Cardiovascular: Normal heart sounds.   No murmur heard. Pulse 160 initially. Decreased to 140 during eval.  Pulmonary/Chest: Effort normal. No stridor. He has wheezes.  Abdominal: Soft. Bowel sounds are normal.  Lymphadenopathy:    He has cervical adenopathy.  Neurological:  Obtunded   Skin: He is diaphoretic. There is pallor.  Clammy     Dg Chest 2 View  Result Date: 09/09/2015 CLINICAL DATA:  Dehydration, loss of appetite. Chronic productive cough. EXAM: CHEST  2 VIEW COMPARISON:  None. FINDINGS: Radiopaque tubing transverses the right thorax. Cardiomediastinal silhouette is normal. Mediastinal contours appear intact. There is an ill-defined focal airspace consolidation in the left lung base, which may represent a developing pneumonia. There may be small bilateral pleural effusions. Osseous structures are without acute abnormality. Soft tissues are grossly normal. IMPRESSION: Subtle left lower lobe airspace consolidation, which may represent a developing pneumonia. Probable small bilateral pleural  effusions. Electronically Signed   By: Fidela Salisbury M.D.   On: 09/09/2015 16:31   Dg Abd 1 View  Result Date: 09/09/2015 CLINICAL DATA:  Abdominal pain with loss of appetite EXAM: ABDOMEN - 1 VIEW COMPARISON:  None. FINDINGS: There is no bowel dilatation or air-fluid level suggesting obstruction. No free air is seen on this supine examination. There is moderate stool in the colon. There are no abnormal calcifications. IMPRESSION: Bowel gas pattern unremarkable.  Electronically Signed   By: Lowella Grip III M.D.   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 COMPARISON:  CT head dated 09/07/2011. FINDINGS: Bilateral extra-axial subdural collections, measuring 7 mm on the right and 5 mm on the left (series 2/image 25). No evidence of parenchymal hemorrhage. No mass lesion, mass effect, or midline shift. No CT evidence of acute infarction. Cerebral volume is within normal limits. Ventricles are decompressed with a right parietal approach ventriculostomy catheter terminating at the foramen of Monro. The visualized paranasal sinuses are essentially clear. The mastoid air cells are unopacified. Status post suboccipital craniectomy. Postsurgical changes involving the cerebellar vermis/posterior fossa. No evidence of calvarial fracture. IMPRESSION: Bilateral extra-axial subdural collections, measuring 7 mm on the right and 5 mm on the left. Ventricles are decompressed with a right parietal approach ventriculostomy catheter. Overall, in the absence of a traumatic history, this appearance raises the possibility of intracranial hypotension secondary to overshunting. These results were called by telephone at the time of interpretation on 09/09/2015 at 4:12 pm to Dr. Brantley Stage, who verbally acknowledged these results. Electronically Signed   By: Julian Hy M.D.   On: 09/09/2015 16:14    Assessment & Plan:   Iori was seen today for headache.  Diagnoses and all orders for this visit:  Disorientated -     Veritor Flu A/B Waived -     Glucose Hemocue Waived  Transferred via EMS to [facility]  emergency department  I am having Mr. Desisto maintain his Testosterone, predniSONE, and levothyroxine.  Allergies as of 01/08/2017      Reactions   Morphine And Related       Medication List       Accurate as of 01/08/17  6:24 PM. Always use your most recent med list.          levothyroxine 88 MCG tablet Commonly known as:  SYNTHROID, LEVOTHROID Take 1 tablet (88 mcg total) by mouth daily before breakfast.   predniSONE 10 MG tablet Commonly known as:  DELTASONE Take 1 tablet (10 mg total) by mouth daily with breakfast.   Testosterone 20.25 MG/ACT (1.62%) Gel Commonly known as:  ANDROGEL PUMP One pump on each shoulder every morning        Follow-up: No Follow-up on file.  Claretta Fraise, M.D.

## 2017-01-09 ENCOUNTER — Encounter: Payer: Medicare Other | Admitting: *Deleted

## 2017-01-11 ENCOUNTER — Encounter: Payer: Medicare Other | Admitting: *Deleted

## 2017-01-11 DIAGNOSIS — G8194 Hemiplegia, unspecified affecting left nondominant side: Secondary | ICD-10-CM | POA: Diagnosis not present

## 2017-01-11 DIAGNOSIS — E2749 Other adrenocortical insufficiency: Secondary | ICD-10-CM | POA: Diagnosis not present

## 2017-01-11 DIAGNOSIS — J101 Influenza due to other identified influenza virus with other respiratory manifestations: Secondary | ICD-10-CM | POA: Diagnosis not present

## 2017-01-11 DIAGNOSIS — H905 Unspecified sensorineural hearing loss: Secondary | ICD-10-CM | POA: Diagnosis not present

## 2017-01-11 DIAGNOSIS — C716 Malignant neoplasm of cerebellum: Secondary | ICD-10-CM | POA: Diagnosis not present

## 2017-01-11 DIAGNOSIS — N189 Chronic kidney disease, unspecified: Secondary | ICD-10-CM | POA: Diagnosis not present

## 2017-01-15 DIAGNOSIS — H905 Unspecified sensorineural hearing loss: Secondary | ICD-10-CM | POA: Diagnosis not present

## 2017-01-15 DIAGNOSIS — G8194 Hemiplegia, unspecified affecting left nondominant side: Secondary | ICD-10-CM | POA: Diagnosis not present

## 2017-01-15 DIAGNOSIS — E2749 Other adrenocortical insufficiency: Secondary | ICD-10-CM | POA: Diagnosis not present

## 2017-01-15 DIAGNOSIS — C716 Malignant neoplasm of cerebellum: Secondary | ICD-10-CM | POA: Diagnosis not present

## 2017-01-15 DIAGNOSIS — J101 Influenza due to other identified influenza virus with other respiratory manifestations: Secondary | ICD-10-CM | POA: Diagnosis not present

## 2017-01-15 DIAGNOSIS — N189 Chronic kidney disease, unspecified: Secondary | ICD-10-CM | POA: Diagnosis not present

## 2017-01-16 ENCOUNTER — Encounter: Payer: Medicare Other | Admitting: *Deleted

## 2017-01-17 DIAGNOSIS — J101 Influenza due to other identified influenza virus with other respiratory manifestations: Secondary | ICD-10-CM | POA: Diagnosis not present

## 2017-01-17 DIAGNOSIS — H905 Unspecified sensorineural hearing loss: Secondary | ICD-10-CM | POA: Diagnosis not present

## 2017-01-17 DIAGNOSIS — G8194 Hemiplegia, unspecified affecting left nondominant side: Secondary | ICD-10-CM | POA: Diagnosis not present

## 2017-01-17 DIAGNOSIS — N189 Chronic kidney disease, unspecified: Secondary | ICD-10-CM | POA: Diagnosis not present

## 2017-01-17 DIAGNOSIS — E2749 Other adrenocortical insufficiency: Secondary | ICD-10-CM | POA: Diagnosis not present

## 2017-01-17 DIAGNOSIS — C716 Malignant neoplasm of cerebellum: Secondary | ICD-10-CM | POA: Diagnosis not present

## 2017-01-18 ENCOUNTER — Encounter: Payer: Medicare Other | Admitting: *Deleted

## 2017-01-23 ENCOUNTER — Encounter: Payer: Medicare Other | Admitting: *Deleted

## 2017-01-25 ENCOUNTER — Encounter: Payer: Medicare Other | Admitting: *Deleted

## 2017-01-26 DIAGNOSIS — H905 Unspecified sensorineural hearing loss: Secondary | ICD-10-CM | POA: Diagnosis not present

## 2017-01-26 DIAGNOSIS — E2749 Other adrenocortical insufficiency: Secondary | ICD-10-CM | POA: Diagnosis not present

## 2017-01-26 DIAGNOSIS — N189 Chronic kidney disease, unspecified: Secondary | ICD-10-CM | POA: Diagnosis not present

## 2017-01-26 DIAGNOSIS — J101 Influenza due to other identified influenza virus with other respiratory manifestations: Secondary | ICD-10-CM | POA: Diagnosis not present

## 2017-01-26 DIAGNOSIS — G8194 Hemiplegia, unspecified affecting left nondominant side: Secondary | ICD-10-CM | POA: Diagnosis not present

## 2017-01-26 DIAGNOSIS — C716 Malignant neoplasm of cerebellum: Secondary | ICD-10-CM | POA: Diagnosis not present

## 2017-01-30 ENCOUNTER — Encounter: Payer: Medicare Other | Admitting: *Deleted

## 2017-01-30 DIAGNOSIS — H905 Unspecified sensorineural hearing loss: Secondary | ICD-10-CM | POA: Diagnosis not present

## 2017-01-30 DIAGNOSIS — N189 Chronic kidney disease, unspecified: Secondary | ICD-10-CM | POA: Diagnosis not present

## 2017-01-30 DIAGNOSIS — J101 Influenza due to other identified influenza virus with other respiratory manifestations: Secondary | ICD-10-CM | POA: Diagnosis not present

## 2017-01-30 DIAGNOSIS — E2749 Other adrenocortical insufficiency: Secondary | ICD-10-CM | POA: Diagnosis not present

## 2017-01-30 DIAGNOSIS — C716 Malignant neoplasm of cerebellum: Secondary | ICD-10-CM | POA: Diagnosis not present

## 2017-01-30 DIAGNOSIS — G8194 Hemiplegia, unspecified affecting left nondominant side: Secondary | ICD-10-CM | POA: Diagnosis not present

## 2017-01-31 ENCOUNTER — Ambulatory Visit: Payer: Medicare Other | Admitting: "Endocrinology

## 2017-02-01 ENCOUNTER — Encounter: Payer: Medicare Other | Admitting: *Deleted

## 2017-02-02 DIAGNOSIS — H905 Unspecified sensorineural hearing loss: Secondary | ICD-10-CM | POA: Diagnosis not present

## 2017-02-02 DIAGNOSIS — C716 Malignant neoplasm of cerebellum: Secondary | ICD-10-CM | POA: Diagnosis not present

## 2017-02-02 DIAGNOSIS — N189 Chronic kidney disease, unspecified: Secondary | ICD-10-CM | POA: Diagnosis not present

## 2017-02-02 DIAGNOSIS — E2749 Other adrenocortical insufficiency: Secondary | ICD-10-CM | POA: Diagnosis not present

## 2017-02-02 DIAGNOSIS — G8194 Hemiplegia, unspecified affecting left nondominant side: Secondary | ICD-10-CM | POA: Diagnosis not present

## 2017-02-02 DIAGNOSIS — J101 Influenza due to other identified influenza virus with other respiratory manifestations: Secondary | ICD-10-CM | POA: Diagnosis not present

## 2017-02-07 ENCOUNTER — Encounter: Payer: Self-pay | Admitting: Pediatrics

## 2017-02-07 ENCOUNTER — Ambulatory Visit (INDEPENDENT_AMBULATORY_CARE_PROVIDER_SITE_OTHER): Payer: Medicare Other | Admitting: Pediatrics

## 2017-02-07 VITALS — BP 117/77 | HR 106 | Temp 97.2°F | Ht 70.0 in | Wt 137.0 lb

## 2017-02-07 DIAGNOSIS — C716 Malignant neoplasm of cerebellum: Secondary | ICD-10-CM | POA: Diagnosis not present

## 2017-02-07 DIAGNOSIS — H905 Unspecified sensorineural hearing loss: Secondary | ICD-10-CM | POA: Diagnosis not present

## 2017-02-07 DIAGNOSIS — E2749 Other adrenocortical insufficiency: Secondary | ICD-10-CM | POA: Diagnosis not present

## 2017-02-07 DIAGNOSIS — N189 Chronic kidney disease, unspecified: Secondary | ICD-10-CM | POA: Diagnosis not present

## 2017-02-07 DIAGNOSIS — K047 Periapical abscess without sinus: Secondary | ICD-10-CM | POA: Diagnosis not present

## 2017-02-07 DIAGNOSIS — J101 Influenza due to other identified influenza virus with other respiratory manifestations: Secondary | ICD-10-CM | POA: Diagnosis not present

## 2017-02-07 DIAGNOSIS — G8194 Hemiplegia, unspecified affecting left nondominant side: Secondary | ICD-10-CM | POA: Diagnosis not present

## 2017-02-07 MED ORDER — AMOXICILLIN-POT CLAVULANATE 875-125 MG PO TABS
1.0000 | ORAL_TABLET | Freq: Two times a day (BID) | ORAL | 0 refills | Status: DC
Start: 2017-02-07 — End: 2017-08-13

## 2017-02-07 NOTE — Patient Instructions (Signed)
[facility]  Address: [address], [place], [place] [address] Phone: [phone] to set up dental appointment  [facility] Address: [address], [place], [place] [address] Phone: [phone]  [facility] [address] [place], [place] [address] [phone]

## 2017-02-07 NOTE — Progress Notes (Signed)
  Subjective:   Patient ID: James Hoffman, male    DOB: Mar 27, 1996, 21 y.o.   MRN: [id number] CC: Oral Pain  HPI: James Hoffman is a 21 y.o. male presenting for Oral Pain  Mouth pain has been getting worse for the past week Has had mouth pain for weeks Has been chewing on L side of mouth for months because has pain on other side Mom says has been complaing past week Poor dentition mom says has had since radiation and chemo for brain tumor Not brushing regularly, says it hurts too much Says it has been like that for a while hasnt recently seen dentist Mom has not been able to find provider that takes his insurance  Relevant past medical, surgical, family and social history reviewed. Allergies and medications reviewed and updated. History  Smoking Status  . Current Every Day Smoker  . Packs/day: 0.50  . Types: Cigarettes  . Start date: 11/25/2012  Smokeless Tobacco  . Never Used   ROS: Per HPI   Objective:    BP 117/77   Pulse (!) 106   Temp 97.2 F (36.2 C) (Oral)   Ht 5\' 10"  (1.778 m)   Wt 137 lb (62.1 kg)   BMI 19.66 kg/m   Wt Readings from Last 3 Encounters:  02/07/17 137 lb (62.1 kg)  12/18/16 137 lb 12.8 oz (62.5 kg)  11/01/16 137 lb (62.1 kg)    Gen: NAD, alert, cooperative with exam, NCAT EYES: EOMI, no conjunctival injection, or no icterus ENT:  TMs pearly gray b/l, OP without erythema, dental decay throughout, back bottom molars b/l with large caries, R lower back gum ttp. No fluctuance LYMPH: no cervical LAD CV: NRRR, normal S1/S2, no murmur, distal pulses 2+ b/l Resp: CTABL, no wheezes, normal WOB Abd: +BS, soft, NTND. no guarding or organomegaly Ext: No edema, warm  Assessment & Plan:  James Hoffman was seen today for oral pain.  Diagnoses and all orders for this visit:  Dental infection Tooth decay throughout TTP along gum back R molar Will treat for dental infection with below Gave list of dentists in area -     Ambulatory referral to  Dentistry -     amoxicillin-clavulanate (AUGMENTIN) 875-125 MG tablet; Take 1 tablet by mouth 2 (two) times daily.  Follow up plan: Return if symptoms worsen or fail to improve. Assunta Found, MD [place]

## 2017-02-09 ENCOUNTER — Ambulatory Visit (INDEPENDENT_AMBULATORY_CARE_PROVIDER_SITE_OTHER): Payer: Medicare Other | Admitting: Family Medicine

## 2017-02-09 DIAGNOSIS — Z9181 History of falling: Secondary | ICD-10-CM | POA: Diagnosis not present

## 2017-02-09 DIAGNOSIS — F0390 Unspecified dementia without behavioral disturbance: Secondary | ICD-10-CM

## 2017-02-09 DIAGNOSIS — Z982 Presence of cerebrospinal fluid drainage device: Secondary | ICD-10-CM

## 2017-02-09 DIAGNOSIS — C716 Malignant neoplasm of cerebellum: Secondary | ICD-10-CM | POA: Diagnosis not present

## 2017-02-09 DIAGNOSIS — J101 Influenza due to other identified influenza virus with other respiratory manifestations: Secondary | ICD-10-CM | POA: Diagnosis not present

## 2017-02-09 DIAGNOSIS — G8194 Hemiplegia, unspecified affecting left nondominant side: Secondary | ICD-10-CM

## 2017-02-09 DIAGNOSIS — E2749 Other adrenocortical insufficiency: Secondary | ICD-10-CM | POA: Diagnosis not present

## 2017-02-09 DIAGNOSIS — H905 Unspecified sensorineural hearing loss: Secondary | ICD-10-CM

## 2017-02-09 DIAGNOSIS — N189 Chronic kidney disease, unspecified: Secondary | ICD-10-CM | POA: Diagnosis not present

## 2017-02-09 DIAGNOSIS — Z7952 Long term (current) use of systemic steroids: Secondary | ICD-10-CM

## 2017-02-12 DIAGNOSIS — E2749 Other adrenocortical insufficiency: Secondary | ICD-10-CM | POA: Diagnosis not present

## 2017-02-12 DIAGNOSIS — R079 Chest pain, unspecified: Secondary | ICD-10-CM | POA: Diagnosis not present

## 2017-02-12 DIAGNOSIS — E2740 Unspecified adrenocortical insufficiency: Secondary | ICD-10-CM | POA: Diagnosis not present

## 2017-02-12 DIAGNOSIS — Z049 Encounter for examination and observation for unspecified reason: Secondary | ICD-10-CM | POA: Diagnosis not present

## 2017-02-12 DIAGNOSIS — M25552 Pain in left hip: Secondary | ICD-10-CM | POA: Diagnosis not present

## 2017-02-12 DIAGNOSIS — W0110XA Fall on same level from slipping, tripping and stumbling with subsequent striking against unspecified object, initial encounter: Secondary | ICD-10-CM | POA: Diagnosis not present

## 2017-02-12 DIAGNOSIS — G8194 Hemiplegia, unspecified affecting left nondominant side: Secondary | ICD-10-CM | POA: Diagnosis not present

## 2017-02-12 DIAGNOSIS — R6510 Systemic inflammatory response syndrome (SIRS) of non-infectious origin without acute organ dysfunction: Secondary | ICD-10-CM | POA: Diagnosis not present

## 2017-02-12 DIAGNOSIS — H905 Unspecified sensorineural hearing loss: Secondary | ICD-10-CM | POA: Diagnosis not present

## 2017-02-12 DIAGNOSIS — K089 Disorder of teeth and supporting structures, unspecified: Secondary | ICD-10-CM | POA: Diagnosis not present

## 2017-02-12 DIAGNOSIS — S7222XA Displaced subtrochanteric fracture of left femur, initial encounter for closed fracture: Secondary | ICD-10-CM | POA: Diagnosis not present

## 2017-02-12 DIAGNOSIS — N189 Chronic kidney disease, unspecified: Secondary | ICD-10-CM | POA: Diagnosis not present

## 2017-02-12 DIAGNOSIS — N179 Acute kidney failure, unspecified: Secondary | ICD-10-CM | POA: Diagnosis not present

## 2017-02-12 DIAGNOSIS — E872 Acidosis: Secondary | ICD-10-CM | POA: Diagnosis not present

## 2017-02-12 DIAGNOSIS — S72142A Displaced intertrochanteric fracture of left femur, initial encounter for closed fracture: Secondary | ICD-10-CM | POA: Diagnosis not present

## 2017-02-12 DIAGNOSIS — J101 Influenza due to other identified influenza virus with other respiratory manifestations: Secondary | ICD-10-CM | POA: Diagnosis not present

## 2017-02-12 DIAGNOSIS — S72141A Displaced intertrochanteric fracture of right femur, initial encounter for closed fracture: Secondary | ICD-10-CM | POA: Diagnosis not present

## 2017-02-12 DIAGNOSIS — R000 Tachycardia, unspecified: Secondary | ICD-10-CM | POA: Diagnosis not present

## 2017-02-12 DIAGNOSIS — C716 Malignant neoplasm of cerebellum: Secondary | ICD-10-CM | POA: Diagnosis not present

## 2017-02-13 DIAGNOSIS — D62 Acute posthemorrhagic anemia: Secondary | ICD-10-CM | POA: Diagnosis not present

## 2017-02-13 DIAGNOSIS — R000 Tachycardia, unspecified: Secondary | ICD-10-CM | POA: Diagnosis not present

## 2017-02-13 DIAGNOSIS — K029 Dental caries, unspecified: Secondary | ICD-10-CM | POA: Diagnosis present

## 2017-02-13 DIAGNOSIS — Z7952 Long term (current) use of systemic steroids: Secondary | ICD-10-CM | POA: Diagnosis not present

## 2017-02-13 DIAGNOSIS — Z9889 Other specified postprocedural states: Secondary | ICD-10-CM | POA: Diagnosis not present

## 2017-02-13 DIAGNOSIS — Z043 Encounter for examination and observation following other accident: Secondary | ICD-10-CM | POA: Diagnosis not present

## 2017-02-13 DIAGNOSIS — N179 Acute kidney failure, unspecified: Secondary | ICD-10-CM | POA: Diagnosis not present

## 2017-02-13 DIAGNOSIS — H905 Unspecified sensorineural hearing loss: Secondary | ICD-10-CM | POA: Diagnosis present

## 2017-02-13 DIAGNOSIS — S7222XA Displaced subtrochanteric fracture of left femur, initial encounter for closed fracture: Secondary | ICD-10-CM | POA: Diagnosis not present

## 2017-02-13 DIAGNOSIS — E872 Acidosis: Secondary | ICD-10-CM | POA: Diagnosis not present

## 2017-02-13 DIAGNOSIS — S72142A Displaced intertrochanteric fracture of left femur, initial encounter for closed fracture: Secondary | ICD-10-CM | POA: Diagnosis not present

## 2017-02-13 DIAGNOSIS — E2740 Unspecified adrenocortical insufficiency: Secondary | ICD-10-CM | POA: Diagnosis not present

## 2017-02-13 DIAGNOSIS — K089 Disorder of teeth and supporting structures, unspecified: Secondary | ICD-10-CM | POA: Diagnosis not present

## 2017-02-13 DIAGNOSIS — R6510 Systemic inflammatory response syndrome (SIRS) of non-infectious origin without acute organ dysfunction: Secondary | ICD-10-CM | POA: Insufficient documentation

## 2017-02-13 DIAGNOSIS — Z923 Personal history of irradiation: Secondary | ICD-10-CM | POA: Diagnosis not present

## 2017-02-13 DIAGNOSIS — W19XXXA Unspecified fall, initial encounter: Secondary | ICD-10-CM | POA: Diagnosis not present

## 2017-02-13 DIAGNOSIS — S7290XD Unspecified fracture of unspecified femur, subsequent encounter for closed fracture with routine healing: Secondary | ICD-10-CM | POA: Diagnosis not present

## 2017-02-13 DIAGNOSIS — R609 Edema, unspecified: Secondary | ICD-10-CM | POA: Diagnosis not present

## 2017-02-13 DIAGNOSIS — Z885 Allergy status to narcotic agent status: Secondary | ICD-10-CM | POA: Diagnosis not present

## 2017-02-13 DIAGNOSIS — E039 Hypothyroidism, unspecified: Secondary | ICD-10-CM | POA: Diagnosis not present

## 2017-02-13 DIAGNOSIS — Z79899 Other long term (current) drug therapy: Secondary | ICD-10-CM | POA: Diagnosis not present

## 2017-02-13 DIAGNOSIS — S72102A Unspecified trochanteric fracture of left femur, initial encounter for closed fracture: Secondary | ICD-10-CM | POA: Diagnosis not present

## 2017-02-13 DIAGNOSIS — Z79890 Hormone replacement therapy (postmenopausal): Secondary | ICD-10-CM | POA: Diagnosis not present

## 2017-02-13 DIAGNOSIS — Z9221 Personal history of antineoplastic chemotherapy: Secondary | ICD-10-CM | POA: Diagnosis not present

## 2017-02-13 DIAGNOSIS — M7989 Other specified soft tissue disorders: Secondary | ICD-10-CM | POA: Diagnosis present

## 2017-02-13 DIAGNOSIS — Z85841 Personal history of malignant neoplasm of brain: Secondary | ICD-10-CM | POA: Diagnosis not present

## 2017-02-13 DIAGNOSIS — E2749 Other adrenocortical insufficiency: Secondary | ICD-10-CM | POA: Diagnosis not present

## 2017-02-13 DIAGNOSIS — R079 Chest pain, unspecified: Secondary | ICD-10-CM | POA: Diagnosis not present

## 2017-02-13 DIAGNOSIS — Z982 Presence of cerebrospinal fluid drainage device: Secondary | ICD-10-CM | POA: Diagnosis not present

## 2017-02-13 DIAGNOSIS — S7292XA Unspecified fracture of left femur, initial encounter for closed fracture: Secondary | ICD-10-CM | POA: Diagnosis not present

## 2017-02-16 ENCOUNTER — Ambulatory Visit: Payer: Medicare Other | Admitting: "Endocrinology

## 2017-02-21 ENCOUNTER — Ambulatory Visit: Payer: Medicare Other | Admitting: Pediatrics

## 2017-02-22 ENCOUNTER — Encounter: Payer: Self-pay | Admitting: Family Medicine

## 2017-02-22 ENCOUNTER — Telehealth: Payer: Self-pay | Admitting: Family Medicine

## 2017-02-22 DIAGNOSIS — Z09 Encounter for follow-up examination after completed treatment for conditions other than malignant neoplasm: Secondary | ICD-10-CM | POA: Insufficient documentation

## 2017-02-22 DIAGNOSIS — S7222XD Displaced subtrochanteric fracture of left femur, subsequent encounter for closed fracture with routine healing: Secondary | ICD-10-CM | POA: Diagnosis not present

## 2017-02-23 DIAGNOSIS — C716 Malignant neoplasm of cerebellum: Secondary | ICD-10-CM | POA: Diagnosis not present

## 2017-02-23 DIAGNOSIS — N189 Chronic kidney disease, unspecified: Secondary | ICD-10-CM | POA: Diagnosis not present

## 2017-02-23 DIAGNOSIS — J101 Influenza due to other identified influenza virus with other respiratory manifestations: Secondary | ICD-10-CM | POA: Diagnosis not present

## 2017-02-23 DIAGNOSIS — H905 Unspecified sensorineural hearing loss: Secondary | ICD-10-CM | POA: Diagnosis not present

## 2017-02-23 DIAGNOSIS — E2749 Other adrenocortical insufficiency: Secondary | ICD-10-CM | POA: Diagnosis not present

## 2017-02-23 DIAGNOSIS — G8194 Hemiplegia, unspecified affecting left nondominant side: Secondary | ICD-10-CM | POA: Diagnosis not present

## 2017-02-26 DIAGNOSIS — E2749 Other adrenocortical insufficiency: Secondary | ICD-10-CM | POA: Diagnosis not present

## 2017-02-26 DIAGNOSIS — H905 Unspecified sensorineural hearing loss: Secondary | ICD-10-CM | POA: Diagnosis not present

## 2017-02-26 DIAGNOSIS — G8194 Hemiplegia, unspecified affecting left nondominant side: Secondary | ICD-10-CM | POA: Diagnosis not present

## 2017-02-26 DIAGNOSIS — J101 Influenza due to other identified influenza virus with other respiratory manifestations: Secondary | ICD-10-CM | POA: Diagnosis not present

## 2017-02-26 DIAGNOSIS — N189 Chronic kidney disease, unspecified: Secondary | ICD-10-CM | POA: Diagnosis not present

## 2017-02-26 DIAGNOSIS — C716 Malignant neoplasm of cerebellum: Secondary | ICD-10-CM | POA: Diagnosis not present

## 2017-02-28 DIAGNOSIS — J101 Influenza due to other identified influenza virus with other respiratory manifestations: Secondary | ICD-10-CM | POA: Diagnosis not present

## 2017-02-28 DIAGNOSIS — N189 Chronic kidney disease, unspecified: Secondary | ICD-10-CM | POA: Diagnosis not present

## 2017-02-28 DIAGNOSIS — H905 Unspecified sensorineural hearing loss: Secondary | ICD-10-CM | POA: Diagnosis not present

## 2017-02-28 DIAGNOSIS — G8194 Hemiplegia, unspecified affecting left nondominant side: Secondary | ICD-10-CM | POA: Diagnosis not present

## 2017-02-28 DIAGNOSIS — C716 Malignant neoplasm of cerebellum: Secondary | ICD-10-CM | POA: Diagnosis not present

## 2017-02-28 DIAGNOSIS — E2749 Other adrenocortical insufficiency: Secondary | ICD-10-CM | POA: Diagnosis not present

## 2017-03-02 DIAGNOSIS — N189 Chronic kidney disease, unspecified: Secondary | ICD-10-CM | POA: Diagnosis not present

## 2017-03-02 DIAGNOSIS — H905 Unspecified sensorineural hearing loss: Secondary | ICD-10-CM | POA: Diagnosis not present

## 2017-03-02 DIAGNOSIS — S7222XD Displaced subtrochanteric fracture of left femur, subsequent encounter for closed fracture with routine healing: Secondary | ICD-10-CM | POA: Diagnosis not present

## 2017-03-02 DIAGNOSIS — C716 Malignant neoplasm of cerebellum: Secondary | ICD-10-CM | POA: Diagnosis not present

## 2017-03-02 DIAGNOSIS — G8194 Hemiplegia, unspecified affecting left nondominant side: Secondary | ICD-10-CM | POA: Diagnosis not present

## 2017-03-02 DIAGNOSIS — E2749 Other adrenocortical insufficiency: Secondary | ICD-10-CM | POA: Diagnosis not present

## 2017-03-02 DIAGNOSIS — J101 Influenza due to other identified influenza virus with other respiratory manifestations: Secondary | ICD-10-CM | POA: Diagnosis not present

## 2017-03-05 DIAGNOSIS — C716 Malignant neoplasm of cerebellum: Secondary | ICD-10-CM | POA: Diagnosis not present

## 2017-03-05 DIAGNOSIS — J101 Influenza due to other identified influenza virus with other respiratory manifestations: Secondary | ICD-10-CM | POA: Diagnosis not present

## 2017-03-05 DIAGNOSIS — G8194 Hemiplegia, unspecified affecting left nondominant side: Secondary | ICD-10-CM | POA: Diagnosis not present

## 2017-03-05 DIAGNOSIS — E2749 Other adrenocortical insufficiency: Secondary | ICD-10-CM | POA: Diagnosis not present

## 2017-03-05 DIAGNOSIS — H905 Unspecified sensorineural hearing loss: Secondary | ICD-10-CM | POA: Diagnosis not present

## 2017-03-05 DIAGNOSIS — N189 Chronic kidney disease, unspecified: Secondary | ICD-10-CM | POA: Diagnosis not present

## 2017-03-07 DIAGNOSIS — G8194 Hemiplegia, unspecified affecting left nondominant side: Secondary | ICD-10-CM | POA: Diagnosis not present

## 2017-03-07 DIAGNOSIS — J101 Influenza due to other identified influenza virus with other respiratory manifestations: Secondary | ICD-10-CM | POA: Diagnosis not present

## 2017-03-07 DIAGNOSIS — N189 Chronic kidney disease, unspecified: Secondary | ICD-10-CM | POA: Diagnosis not present

## 2017-03-07 DIAGNOSIS — E2749 Other adrenocortical insufficiency: Secondary | ICD-10-CM | POA: Diagnosis not present

## 2017-03-07 DIAGNOSIS — C716 Malignant neoplasm of cerebellum: Secondary | ICD-10-CM | POA: Diagnosis not present

## 2017-03-07 DIAGNOSIS — H905 Unspecified sensorineural hearing loss: Secondary | ICD-10-CM | POA: Diagnosis not present

## 2017-03-08 DIAGNOSIS — N189 Chronic kidney disease, unspecified: Secondary | ICD-10-CM | POA: Diagnosis not present

## 2017-03-08 DIAGNOSIS — J101 Influenza due to other identified influenza virus with other respiratory manifestations: Secondary | ICD-10-CM | POA: Diagnosis not present

## 2017-03-08 DIAGNOSIS — G8194 Hemiplegia, unspecified affecting left nondominant side: Secondary | ICD-10-CM | POA: Diagnosis not present

## 2017-03-08 DIAGNOSIS — E2749 Other adrenocortical insufficiency: Secondary | ICD-10-CM | POA: Diagnosis not present

## 2017-03-08 DIAGNOSIS — H905 Unspecified sensorineural hearing loss: Secondary | ICD-10-CM | POA: Diagnosis not present

## 2017-03-08 DIAGNOSIS — C716 Malignant neoplasm of cerebellum: Secondary | ICD-10-CM | POA: Diagnosis not present

## 2017-03-12 DIAGNOSIS — C716 Malignant neoplasm of cerebellum: Secondary | ICD-10-CM | POA: Diagnosis not present

## 2017-03-12 DIAGNOSIS — S7222XD Displaced subtrochanteric fracture of left femur, subsequent encounter for closed fracture with routine healing: Secondary | ICD-10-CM | POA: Diagnosis not present

## 2017-03-12 DIAGNOSIS — G8194 Hemiplegia, unspecified affecting left nondominant side: Secondary | ICD-10-CM | POA: Diagnosis not present

## 2017-03-12 DIAGNOSIS — D631 Anemia in chronic kidney disease: Secondary | ICD-10-CM | POA: Diagnosis not present

## 2017-03-12 DIAGNOSIS — S72141D Displaced intertrochanteric fracture of right femur, subsequent encounter for closed fracture with routine healing: Secondary | ICD-10-CM | POA: Diagnosis not present

## 2017-03-12 DIAGNOSIS — N189 Chronic kidney disease, unspecified: Secondary | ICD-10-CM | POA: Diagnosis not present

## 2017-03-13 DIAGNOSIS — Z9181 History of falling: Secondary | ICD-10-CM | POA: Insufficient documentation

## 2017-03-13 DIAGNOSIS — E039 Hypothyroidism, unspecified: Secondary | ICD-10-CM | POA: Diagnosis not present

## 2017-03-13 DIAGNOSIS — Z9221 Personal history of antineoplastic chemotherapy: Secondary | ICD-10-CM | POA: Insufficient documentation

## 2017-03-13 DIAGNOSIS — S7222XD Displaced subtrochanteric fracture of left femur, subsequent encounter for closed fracture with routine healing: Secondary | ICD-10-CM | POA: Diagnosis not present

## 2017-03-13 DIAGNOSIS — Z923 Personal history of irradiation: Secondary | ICD-10-CM | POA: Diagnosis not present

## 2017-03-13 DIAGNOSIS — K089 Disorder of teeth and supporting structures, unspecified: Secondary | ICD-10-CM | POA: Diagnosis not present

## 2017-03-13 DIAGNOSIS — C716 Malignant neoplasm of cerebellum: Secondary | ICD-10-CM | POA: Diagnosis not present

## 2017-03-13 DIAGNOSIS — F17200 Nicotine dependence, unspecified, uncomplicated: Secondary | ICD-10-CM | POA: Diagnosis not present

## 2017-03-13 DIAGNOSIS — S7222XA Displaced subtrochanteric fracture of left femur, initial encounter for closed fracture: Secondary | ICD-10-CM | POA: Diagnosis not present

## 2017-03-13 DIAGNOSIS — T50905A Adverse effect of unspecified drugs, medicaments and biological substances, initial encounter: Secondary | ICD-10-CM | POA: Insufficient documentation

## 2017-03-13 DIAGNOSIS — R7989 Other specified abnormal findings of blood chemistry: Secondary | ICD-10-CM | POA: Diagnosis not present

## 2017-03-13 DIAGNOSIS — E2749 Other adrenocortical insufficiency: Secondary | ICD-10-CM | POA: Diagnosis not present

## 2017-03-13 DIAGNOSIS — Z789 Other specified health status: Secondary | ICD-10-CM | POA: Insufficient documentation

## 2017-03-13 DIAGNOSIS — Z7952 Long term (current) use of systemic steroids: Secondary | ICD-10-CM | POA: Insufficient documentation

## 2017-03-13 DIAGNOSIS — M818 Other osteoporosis without current pathological fracture: Secondary | ICD-10-CM | POA: Insufficient documentation

## 2017-03-14 DIAGNOSIS — S72141D Displaced intertrochanteric fracture of right femur, subsequent encounter for closed fracture with routine healing: Secondary | ICD-10-CM | POA: Diagnosis not present

## 2017-03-14 DIAGNOSIS — C716 Malignant neoplasm of cerebellum: Secondary | ICD-10-CM | POA: Diagnosis not present

## 2017-03-14 DIAGNOSIS — D631 Anemia in chronic kidney disease: Secondary | ICD-10-CM | POA: Diagnosis not present

## 2017-03-14 DIAGNOSIS — G8194 Hemiplegia, unspecified affecting left nondominant side: Secondary | ICD-10-CM | POA: Diagnosis not present

## 2017-03-14 DIAGNOSIS — N189 Chronic kidney disease, unspecified: Secondary | ICD-10-CM | POA: Diagnosis not present

## 2017-03-14 DIAGNOSIS — S7222XD Displaced subtrochanteric fracture of left femur, subsequent encounter for closed fracture with routine healing: Secondary | ICD-10-CM | POA: Diagnosis not present

## 2017-03-16 ENCOUNTER — Ambulatory Visit: Payer: Medicare Other | Admitting: "Endocrinology

## 2017-03-16 DIAGNOSIS — S7222XD Displaced subtrochanteric fracture of left femur, subsequent encounter for closed fracture with routine healing: Secondary | ICD-10-CM | POA: Diagnosis not present

## 2017-03-16 DIAGNOSIS — M818 Other osteoporosis without current pathological fracture: Secondary | ICD-10-CM | POA: Diagnosis not present

## 2017-03-16 DIAGNOSIS — C716 Malignant neoplasm of cerebellum: Secondary | ICD-10-CM | POA: Diagnosis not present

## 2017-03-22 DIAGNOSIS — D631 Anemia in chronic kidney disease: Secondary | ICD-10-CM | POA: Diagnosis not present

## 2017-03-22 DIAGNOSIS — C716 Malignant neoplasm of cerebellum: Secondary | ICD-10-CM | POA: Diagnosis not present

## 2017-03-22 DIAGNOSIS — N189 Chronic kidney disease, unspecified: Secondary | ICD-10-CM | POA: Diagnosis not present

## 2017-03-22 DIAGNOSIS — S7222XD Displaced subtrochanteric fracture of left femur, subsequent encounter for closed fracture with routine healing: Secondary | ICD-10-CM | POA: Diagnosis not present

## 2017-03-22 DIAGNOSIS — S72141D Displaced intertrochanteric fracture of right femur, subsequent encounter for closed fracture with routine healing: Secondary | ICD-10-CM | POA: Diagnosis not present

## 2017-03-22 DIAGNOSIS — G8194 Hemiplegia, unspecified affecting left nondominant side: Secondary | ICD-10-CM | POA: Diagnosis not present

## 2017-04-03 DIAGNOSIS — S7222XD Displaced subtrochanteric fracture of left femur, subsequent encounter for closed fracture with routine healing: Secondary | ICD-10-CM | POA: Diagnosis not present

## 2017-04-03 DIAGNOSIS — C716 Malignant neoplasm of cerebellum: Secondary | ICD-10-CM | POA: Diagnosis not present

## 2017-04-03 DIAGNOSIS — N189 Chronic kidney disease, unspecified: Secondary | ICD-10-CM | POA: Diagnosis not present

## 2017-04-03 DIAGNOSIS — S72141D Displaced intertrochanteric fracture of right femur, subsequent encounter for closed fracture with routine healing: Secondary | ICD-10-CM | POA: Diagnosis not present

## 2017-04-03 DIAGNOSIS — D631 Anemia in chronic kidney disease: Secondary | ICD-10-CM | POA: Diagnosis not present

## 2017-04-03 DIAGNOSIS — G8194 Hemiplegia, unspecified affecting left nondominant side: Secondary | ICD-10-CM | POA: Diagnosis not present

## 2017-04-10 ENCOUNTER — Telehealth: Payer: Self-pay | Admitting: *Deleted

## 2017-04-10 DIAGNOSIS — G8194 Hemiplegia, unspecified affecting left nondominant side: Secondary | ICD-10-CM | POA: Diagnosis not present

## 2017-04-10 DIAGNOSIS — D631 Anemia in chronic kidney disease: Secondary | ICD-10-CM | POA: Diagnosis not present

## 2017-04-10 DIAGNOSIS — N189 Chronic kidney disease, unspecified: Secondary | ICD-10-CM | POA: Diagnosis not present

## 2017-04-10 DIAGNOSIS — C801 Malignant (primary) neoplasm, unspecified: Secondary | ICD-10-CM

## 2017-04-10 DIAGNOSIS — S7222XD Displaced subtrochanteric fracture of left femur, subsequent encounter for closed fracture with routine healing: Secondary | ICD-10-CM | POA: Diagnosis not present

## 2017-04-10 DIAGNOSIS — R2681 Unsteadiness on feet: Secondary | ICD-10-CM

## 2017-04-10 DIAGNOSIS — S72141D Displaced intertrochanteric fracture of right femur, subsequent encounter for closed fracture with routine healing: Secondary | ICD-10-CM | POA: Diagnosis not present

## 2017-04-10 DIAGNOSIS — C716 Malignant neoplasm of cerebellum: Secondary | ICD-10-CM | POA: Diagnosis not present

## 2017-04-10 NOTE — Telephone Encounter (Signed)
NA

## 2017-04-10 NOTE — Telephone Encounter (Signed)
Patient will finish up with in home PT this Thursday. They believe he will benefit now from out patient PT, please put in a referral

## 2017-04-10 NOTE — Telephone Encounter (Signed)
PT referral placed  James Apple, MD [facility] 04/10/2017, 4:51 PM

## 2017-04-12 ENCOUNTER — Ambulatory Visit (INDEPENDENT_AMBULATORY_CARE_PROVIDER_SITE_OTHER): Payer: Medicare Other | Admitting: Pediatrics

## 2017-04-12 DIAGNOSIS — N189 Chronic kidney disease, unspecified: Secondary | ICD-10-CM | POA: Diagnosis not present

## 2017-04-12 DIAGNOSIS — S7222XD Displaced subtrochanteric fracture of left femur, subsequent encounter for closed fracture with routine healing: Secondary | ICD-10-CM | POA: Diagnosis not present

## 2017-04-12 DIAGNOSIS — S72141D Displaced intertrochanteric fracture of right femur, subsequent encounter for closed fracture with routine healing: Secondary | ICD-10-CM | POA: Diagnosis not present

## 2017-04-12 DIAGNOSIS — C716 Malignant neoplasm of cerebellum: Secondary | ICD-10-CM | POA: Diagnosis not present

## 2017-04-12 DIAGNOSIS — G8194 Hemiplegia, unspecified affecting left nondominant side: Secondary | ICD-10-CM | POA: Diagnosis not present

## 2017-04-12 DIAGNOSIS — D631 Anemia in chronic kidney disease: Secondary | ICD-10-CM | POA: Diagnosis not present

## 2017-04-16 ENCOUNTER — Telehealth: Payer: Self-pay | Admitting: Family Medicine

## 2017-04-16 NOTE — Telephone Encounter (Signed)
Pt's mother notified that per Dr Wendi Snipes the benefits outweighs the risks She verbalizes understanding

## 2017-04-23 DIAGNOSIS — F0390 Unspecified dementia without behavioral disturbance: Secondary | ICD-10-CM | POA: Diagnosis not present

## 2017-04-23 DIAGNOSIS — K089 Disorder of teeth and supporting structures, unspecified: Secondary | ICD-10-CM | POA: Diagnosis not present

## 2017-04-23 DIAGNOSIS — K0889 Other specified disorders of teeth and supporting structures: Secondary | ICD-10-CM | POA: Diagnosis not present

## 2017-04-24 ENCOUNTER — Ambulatory Visit: Payer: Medicare Other | Admitting: Physical Therapy

## 2017-05-27 ENCOUNTER — Other Ambulatory Visit: Payer: Self-pay | Admitting: "Endocrinology

## 2017-05-27 DIAGNOSIS — E038 Other specified hypothyroidism: Secondary | ICD-10-CM

## 2017-05-31 ENCOUNTER — Ambulatory Visit: Payer: Medicare Other | Admitting: Physical Therapy

## 2017-06-12 ENCOUNTER — Ambulatory Visit: Payer: Medicare Other | Admitting: Physical Therapy

## 2017-06-21 DIAGNOSIS — M8580 Other specified disorders of bone density and structure, unspecified site: Secondary | ICD-10-CM | POA: Insufficient documentation

## 2017-06-21 DIAGNOSIS — Z923 Personal history of irradiation: Secondary | ICD-10-CM | POA: Diagnosis not present

## 2017-06-21 DIAGNOSIS — E2749 Other adrenocortical insufficiency: Secondary | ICD-10-CM | POA: Diagnosis not present

## 2017-06-21 DIAGNOSIS — E039 Hypothyroidism, unspecified: Secondary | ICD-10-CM | POA: Diagnosis not present

## 2017-06-21 DIAGNOSIS — Z9221 Personal history of antineoplastic chemotherapy: Secondary | ICD-10-CM | POA: Diagnosis not present

## 2017-06-21 DIAGNOSIS — Z9181 History of falling: Secondary | ICD-10-CM | POA: Diagnosis not present

## 2017-06-21 DIAGNOSIS — Z789 Other specified health status: Secondary | ICD-10-CM | POA: Diagnosis not present

## 2017-06-21 DIAGNOSIS — Z7952 Long term (current) use of systemic steroids: Secondary | ICD-10-CM | POA: Diagnosis not present

## 2017-06-21 DIAGNOSIS — F17200 Nicotine dependence, unspecified, uncomplicated: Secondary | ICD-10-CM | POA: Diagnosis not present

## 2017-06-21 DIAGNOSIS — C716 Malignant neoplasm of cerebellum: Secondary | ICD-10-CM | POA: Diagnosis not present

## 2017-06-21 DIAGNOSIS — R7989 Other specified abnormal findings of blood chemistry: Secondary | ICD-10-CM | POA: Diagnosis not present

## 2017-06-25 ENCOUNTER — Ambulatory Visit: Payer: Medicare Other | Attending: Family Medicine | Admitting: Physical Therapy

## 2017-06-25 DIAGNOSIS — R2681 Unsteadiness on feet: Secondary | ICD-10-CM | POA: Insufficient documentation

## 2017-06-25 DIAGNOSIS — R269 Unspecified abnormalities of gait and mobility: Secondary | ICD-10-CM | POA: Insufficient documentation

## 2017-06-25 DIAGNOSIS — R29898 Other symptoms and signs involving the musculoskeletal system: Secondary | ICD-10-CM | POA: Insufficient documentation

## 2017-06-25 DIAGNOSIS — M6281 Muscle weakness (generalized): Secondary | ICD-10-CM | POA: Diagnosis not present

## 2017-06-25 DIAGNOSIS — R260 Ataxic gait: Secondary | ICD-10-CM | POA: Diagnosis not present

## 2017-06-25 NOTE — Therapy (Signed)
[facility] [place], [place], [address] Phone: [phone]   Fax:  [phone]  Physical Therapy Evaluation  Patient Details  Name: James Hoffman MRN: [id number] Date of Birth: 26-Mar-1996 Referring Provider: Kenn File MD.  Encounter Date: 06/25/2017      PT End of Session - 06/25/17 1652    Visit Number 1   Number of Visits 16   Date for PT Re-Evaluation 08/24/17   PT Start Time 0318   PT Stop Time 0402   PT Time Calculation (min) 44 min   Activity Tolerance Patient tolerated treatment well   Behavior During Therapy [facility] for tasks assessed/performed      Past Medical History:  Diagnosis Date  . Adrenal insufficiency ([place])   . 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 Hydrocephalus   . Thyroid disease     Past Surgical History:  Procedure Laterality Date  . brain tumor removal  August 2012    There were no vitals filed for this visit.       Subjective Assessment - 06/25/17 1648    Subjective The patient returns to [place] with a diagnosis of gait instability.  Since he was seen last he fell and sustained a left hip fracture and underwent an ORIF on 02/15/17.  He reports no pain at rest but increasing left hip pain with walking.     Limitations Walking   Patient Stated Goals Get stronger and walk better.   Currently in Pain? Yes   Pain Score 5    Pain Location Hip   Pain Orientation Left   Pain Descriptors / Indicators Aching   Pain Type Acute pain   Pain Onset More than a month ago   Pain Frequency Intermittent   Aggravating Factors  See above.   Pain Relieving Factors Rest.            [facility] PT Assessment - 06/25/17 0001      Assessment   Medical Diagnosis Gait instability.   Referring Provider Kenn File MD.   Onset Date/Surgical Date --  7 years.     Precautions   Precautions --  HALL FALL RISK.  HOH.     Restrictions   Weight Bearing Restrictions No     Balance  Screen   Has the patient fallen in the past 6 months Yes   How many times? --  1.   Has the patient had a decrease in activity level because of a fear of falling?  Yes   Is the patient reluctant to leave their home because of a fear of falling?  No     Prior Function   Level of Independence --  Requires HHA for ambulation.     Cognition   Overall Cognitive Status Within Functional Limits for tasks assessed     Posture/Postural Control   Postural Limitations Rounded Shoulders;Forward head;Decreased lumbar lordosis     AROM   Overall AROM Comments Bilateral heel cord tightness with left limited to neutral.     Strength   Overall Strength Comments left ankle 3+ to 4-/5; left knee= 4/5; left hip abd/flexion= 3/5.  Right ankle eversion 1/5; dorsiflexion= 3-/5; right knee= 4-/5 and right hip= 3-/5.     Palpation   Palpation comment C/o left lateral hip pain.     Transfers   Transfers Sit to Stand   Sit to Stand 4: Min guard     Ambulation/Gait   Gait Pattern Decreased  step length - right;Decreased step length - left;Decreased hip/knee flexion - right;Decreased hip/knee flexion - left;Decreased dorsiflexion - right;Decreased dorsiflexion - left;Right foot flat;Left foot flat;Scissoring;Ataxic;Poor foot clearance - left;Poor foot clearance - right  Right HHA requited for ambulation.            Objective measurements completed on examination: See above findings.          [facility] Adult PT Treatment/Exercise - 06/25/17 0001      Exercises   Exercises Knee/Hip     Knee/Hip Exercises: Aerobic   Nustep Level 3 x 10 minutes.                  PT Short Term Goals - 06/25/17 1712      PT SHORT TERM GOAL #1   Title STG's=LTG's.           PT Long Term Goals - 06/25/17 1712      PT LONG TERM GOAL #1   Title Ind with an advanced HEP.   Time 8   Period Weeks   Status New     PT LONG TERM GOAL #2   Title Increase bilateral LE strength to a solid 4+/5.    Time 8   Period Weeks   Status New     PT LONG TERM GOAL #3   Title Sit to stand x 5 with SBA.   Time 8   Period Weeks   Status New     PT LONG TERM GOAL #4   Title Walk in clinic with CGA/SBA with a cane 500 feet.   Time 8   Period Weeks   Status New                Plan - 06/25/17 1707    Clinical Impression Statement The patient presents to OPPT with severe gait ataxia and bilateral LE weakness.  He is at high risk for falls.  He has multiple gait deviations.  Deficits prevent him from being independent with regards to ambulation and performing ADL's.  Additionally, he has c/o left hip pain when weight bearing due to a fall and subsquent ORIF earlier this year.  Patient will benefit from skilled physical therapy to address deficits.   Clinical Presentation Stable   Clinical Decision Making Moderate   Rehab Potential Fair   PT Frequency 2x / week   PT Duration 8 weeks   PT Treatment/Interventions ADLs/Self Care Home Management;Functional mobility training;Stair training;Gait training;Therapeutic activities;Therapeutic exercise;Balance training;Neuromuscular re-education;Patient/family education   PT Next Visit Plan gait/balance activites and bilateral LE strengthning exercises.   Consulted and Agree with Plan of Care Patient      Patient will benefit from skilled therapeutic intervention in order to improve the following deficits and impairments:  Abnormal gait, Decreased activity tolerance, Decreased strength, Decreased coordination, Decreased balance, Pain  Visit Diagnosis: Unsteadiness on feet - Plan: PT plan of care cert/re-cert  Muscle weakness (generalized) - Plan: PT plan of care cert/re-cert      G-Codes - 16/96/78 1645    Functional Assessment Tool Used (Outpatient Only) Clinical judgement.....   Functional Limitation Mobility: Walking and moving around   Mobility: Walking and Moving Around Current Status [phone]) At least 60 percent but less than 80  percent impaired, limited or restricted   Mobility: Walking and Moving Around Goal Status [phone]) At least 40 percent but less than 60 percent impaired, limited or restricted       Problem List Patient Active Problem List   Diagnosis Date  Noted  . Gait instability 12/19/2016  . Hypogonadism, male 07/19/2016  . Normocytic anemia 04/14/2016  . Muscular deconditioning 09/17/2015  . Other specified hypothyroidism 09/09/2015  . Medullary carcinoma ([place]) 09/09/2015  . Adrenal insufficiency ([place]) 09/09/2015  . Hypotension 09/09/2015  . Hyponatremia 09/09/2015  . Hypokalemia 09/09/2015    Nhi Butrum, [place]  MPT 06/25/2017, 5:15 PM  [facility] [address] [place], [place], [address] Phone: [phone]   Fax:  [phone]  Name: DONTAVIAN MARCHI MRN: [id number] Date of Birth: [birth year]

## 2017-06-28 ENCOUNTER — Ambulatory Visit: Payer: Medicare Other | Admitting: *Deleted

## 2017-06-28 DIAGNOSIS — R29898 Other symptoms and signs involving the musculoskeletal system: Secondary | ICD-10-CM | POA: Diagnosis not present

## 2017-06-28 DIAGNOSIS — M6281 Muscle weakness (generalized): Secondary | ICD-10-CM

## 2017-06-28 DIAGNOSIS — R2681 Unsteadiness on feet: Secondary | ICD-10-CM

## 2017-06-28 DIAGNOSIS — R260 Ataxic gait: Secondary | ICD-10-CM

## 2017-06-28 DIAGNOSIS — R269 Unspecified abnormalities of gait and mobility: Secondary | ICD-10-CM | POA: Diagnosis not present

## 2017-06-28 NOTE — Therapy (Signed)
[facility] [place], [place], [address] Phone: [phone]   Fax:  [phone]  Physical Therapy Treatment  Patient Details  Name: James Hoffman MRN: [id number] Date of Birth: February 15, 1996 Referring Provider: Kenn File MD.  Encounter Date: 06/28/2017      PT End of Session - 06/28/17 1552    Visit Number 2   Number of Visits 16   Date for PT Re-Evaluation 08/24/17   PT Start Time 2536   PT Stop Time 1605   PT Time Calculation (min) 46 min      Past Medical History:  Diagnosis Date  . Adrenal insufficiency ([place])   . 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 Hydrocephalus   . Thyroid disease     Past Surgical History:  Procedure Laterality Date  . brain tumor removal  August 2012    There were no vitals filed for this visit.      Subjective Assessment - 06/28/17 1519    Subjective  Feeling dizzy today    .The patient returns to [place] with a diagnosis of gait instability.  Since he was seen last he fell and sustained a left hip fracture and underwent an ORIF on 02/15/17.  He reports no pain at rest but increasing left hip pain with walking.     Limitations Walking   Patient Stated Goals Get stronger and walk better.   Currently in Pain? Yes   Pain Score 5                          [facility] Adult PT Treatment/Exercise - 06/28/17 0001      Ambulation/Gait   Gait Pattern Decreased step length - right;Decreased step length - left;Decreased hip/knee flexion - right;Decreased hip/knee flexion - left;Decreased dorsiflexion - right;Decreased dorsiflexion - left;Right foot flat;Left foot flat;Scissoring;Ataxic;Poor foot clearance - left;Poor foot clearance - right  Right HHA requited for ambulation.     Exercises   Exercises Knee/Hip     Knee/Hip Exercises: Aerobic   Nustep Level  5 x 15 minutes.   seat 12     Knee/Hip Exercises: Seated   Long Arc Quad Strengthening;Both;2 sets;10 reps    Long Arc Quad Weight 2 lbs.     Knee/Hip Exercises: Supine   Straight Leg Raises Both;2 sets;10 reps;AROM     Knee/Hip Exercises: Sidelying   Hip ABduction Left;AROM;AAROM;10 reps;3 sets  pain in LT hip                  PT Short Term Goals - 06/25/17 1712      PT SHORT TERM GOAL #1   Title STG's=LTG's.           PT Long Term Goals - 06/25/17 1712      PT LONG TERM GOAL #1   Title Ind with an advanced HEP.   Time 8   Period Weeks   Status New     PT LONG TERM GOAL #2   Title Increase bilateral LE strength to a solid 4+/5.   Time 8   Period Weeks   Status New     PT LONG TERM GOAL #3   Title Sit to stand x 5 with SBA.   Time 8   Period Weeks   Status New     PT LONG TERM GOAL #4   Title Walk in clinic with CGA/SBA with a cane 500 feet.   Time  8   Period Weeks   Status New               Plan - 06/28/17 1553    Clinical Impression Statement Pt arrived to clinic doing fairly well, but was feeling dizzy when he was up. He reports that this is normal for him to have days like this. Rx focused on LE endurance and strengthening. No balanc e exs performed due to diizziness. He did fairly well with therex, but was only able to tolerate about 30 mins  today due to not feeling well. He needed AAROM on LT hip abduction dur to some increased pain and weakness.   Rehab Potential Fair   PT Frequency 2x / week   PT Duration 8 weeks   PT Treatment/Interventions ADLs/Self Care Home Management;Functional mobility training;Stair training;Gait training;Therapeutic activities;Therapeutic exercise;Balance training;Neuromuscular re-education;Patient/family education   PT Next Visit Plan gait/balance activites and bilateral LE strengthning exercises.   Consulted and Agree with Plan of Care Patient      Patient will benefit from skilled therapeutic intervention in order to improve the following deficits and impairments:  Abnormal gait, Decreased activity tolerance,  Decreased strength, Decreased coordination, Decreased balance, Pain  Visit Diagnosis: Muscle weakness (generalized)  Unsteadiness on feet  Ataxic gait  Unsteadiness  Abnormality of gait  Weakness of both lower extremities     Problem List Patient Active Problem List   Diagnosis Date Noted  . Gait instability 12/19/2016  . Hypogonadism, male 07/19/2016  . Normocytic anemia 04/14/2016  . Muscular deconditioning 09/17/2015  . Other specified hypothyroidism 09/09/2015  . Medullary carcinoma ([place]) 09/09/2015  . Adrenal insufficiency ([place]) 09/09/2015  . Hypotension 09/09/2015  . Hyponatremia 09/09/2015  . Hypokalemia 09/09/2015    RAMSEUR,[name], PTA 06/28/2017, 5:25 PM  [facility] [place], [place], [address] Phone: [phone]   Fax:  [phone]  Name: James Hoffman MRN: [id number] Date of Birth: Jun 20, 1996

## 2017-07-05 ENCOUNTER — Ambulatory Visit: Payer: Medicare Other | Admitting: Physical Therapy

## 2017-07-05 ENCOUNTER — Encounter: Payer: Self-pay | Admitting: Physical Therapy

## 2017-07-05 DIAGNOSIS — R2681 Unsteadiness on feet: Secondary | ICD-10-CM | POA: Diagnosis not present

## 2017-07-05 DIAGNOSIS — R29898 Other symptoms and signs involving the musculoskeletal system: Secondary | ICD-10-CM | POA: Diagnosis not present

## 2017-07-05 DIAGNOSIS — R269 Unspecified abnormalities of gait and mobility: Secondary | ICD-10-CM | POA: Diagnosis not present

## 2017-07-05 DIAGNOSIS — M6281 Muscle weakness (generalized): Secondary | ICD-10-CM | POA: Diagnosis not present

## 2017-07-05 DIAGNOSIS — R260 Ataxic gait: Secondary | ICD-10-CM | POA: Diagnosis not present

## 2017-07-05 NOTE — Therapy (Signed)
[facility] [place], [place], [address] Phone: [phone]   Fax:  [phone]  Physical Therapy Treatment  Patient Details  Name: James Hoffman MRN: [id number] Date of Birth: 17-Jan-1996 Referring Provider: Kenn File MD.  Encounter Date: 07/05/2017      PT End of Session - 07/05/17 1530    Visit Number 3   Number of Visits 16   Date for PT Re-Evaluation 08/24/17   PT Start Time 5027   PT Stop Time 1600   PT Time Calculation (min) 30 min   Activity Tolerance Patient tolerated treatment well;Patient limited by fatigue   Behavior During Therapy [facility] for tasks assessed/performed      Past Medical History:  Diagnosis Date  . Adrenal insufficiency ([place])   . 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 Hydrocephalus   . Thyroid disease     Past Surgical History:  Procedure Laterality Date  . brain tumor removal  August 2012    There were no vitals filed for this visit.      Subjective Assessment - 07/05/17 1529    Subjective Reports that he has a headache today. Reports that his hip hurts every now and then.   Limitations Walking   Patient Stated Goals Get stronger and walk better.   Currently in Pain? Yes   Pain Score 3    Pain Location Hip   Pain Orientation Left   Pain Descriptors / Indicators Discomfort   Pain Type Acute pain   Pain Onset More than a month ago            [facility] PT Assessment - 07/05/17 0001      Assessment   Medical Diagnosis Gait instability.     Restrictions   Weight Bearing Restrictions No                     [facility] Adult PT Treatment/Exercise - 07/05/17 0001      Knee/Hip Exercises: Aerobic   Nustep L5, seat   x10 mn     Knee/Hip Exercises: Seated   Sit to Sand 10 reps;with UE support     Knee/Hip Exercises: Supine   Short Arc Quad Sets Strengthening;Left;3 sets;10 reps   Short Arc Quad Sets Limitations 3#   Heel Slides AROM;Left;3 sets;10  reps   Straight Leg Raises Both;2 sets;10 reps;AAROM  AROM with RLE   Other Supine Knee/Hip Exercises B ankle pumps in supine x20 reps   Other Supine Knee/Hip Exercises L hip abduction in supine x20 reps                  PT Short Term Goals - 06/25/17 1712      PT SHORT TERM GOAL #1   Title STG's=LTG's.           PT Long Term Goals - 07/05/17 1609      PT LONG TERM GOAL #1   Title Ind with an advanced HEP.   Time 8   Period Weeks   Status On-going     PT LONG TERM GOAL #2   Title Increase bilateral LE strength to a solid 4+/5.   Time 8   Period Weeks   Status On-going     PT LONG TERM GOAL #3   Title Sit to stand x 5 with SBA.   Time 8   Period Weeks   Status Achieved     PT LONG TERM GOAL #4  Title Walk in clinic with CGA/SBA with a cane 500 feet.   Time 8   Period Weeks   Status On-going               Plan - 07/05/17 1610    Clinical Impression Statement Patient arrived in clinic with reports of low level L hip pain but also of a headache. Patient required SBA for ambulation while in clinic as well as sit to stands today. Min assist +1 required for LLE SLR secondary to weakness. Patient able to complete all other exercises independently. Patient encouraged to continue ankle pumps at home as B ankles rest in PF and to improve DF during gait. Patient did fatigue with exercises thus treatment discontinued early.   Rehab Potential Fair   PT Frequency 2x / week   PT Duration 8 weeks   PT Treatment/Interventions ADLs/Self Care Home Management;Functional mobility training;Stair training;Gait training;Therapeutic activities;Therapeutic exercise;Balance training;Neuromuscular re-education;Patient/family education   PT Next Visit Plan gait/balance activites and bilateral LE strengthning exercises.   Consulted and Agree with Plan of Care Patient      Patient will benefit from skilled therapeutic intervention in order to improve the following deficits  and impairments:  Abnormal gait, Decreased activity tolerance, Decreased strength, Decreased coordination, Decreased balance, Pain  Visit Diagnosis: Muscle weakness (generalized)  Unsteadiness on feet     Problem List Patient Active Problem List   Diagnosis Date Noted  . Gait instability 12/19/2016  . Hypogonadism, male 07/19/2016  . Normocytic anemia 04/14/2016  . Muscular deconditioning 09/17/2015  . Other specified hypothyroidism 09/09/2015  . Medullary carcinoma ([place]) 09/09/2015  . Adrenal insufficiency ([place]) 09/09/2015  . Hypotension 09/09/2015  . Hyponatremia 09/09/2015  . Hypokalemia 09/09/2015    Wynelle Fanny, PTA 07/05/2017, 4:19 PM  [facility] [address] [place], [place], [address] Phone: [phone]   Fax:  [phone]  Name: ALTIN SEASE MRN: [id number] Date of Birth: 01-Nov-1996

## 2017-07-10 ENCOUNTER — Ambulatory Visit: Payer: Medicare Other | Admitting: *Deleted

## 2017-07-10 DIAGNOSIS — M6281 Muscle weakness (generalized): Secondary | ICD-10-CM | POA: Diagnosis not present

## 2017-07-10 DIAGNOSIS — R269 Unspecified abnormalities of gait and mobility: Secondary | ICD-10-CM | POA: Diagnosis not present

## 2017-07-10 DIAGNOSIS — R29898 Other symptoms and signs involving the musculoskeletal system: Secondary | ICD-10-CM

## 2017-07-10 DIAGNOSIS — R2681 Unsteadiness on feet: Secondary | ICD-10-CM | POA: Diagnosis not present

## 2017-07-10 DIAGNOSIS — R260 Ataxic gait: Secondary | ICD-10-CM

## 2017-07-10 NOTE — Therapy (Signed)
[facility] [place], [place], [address] Phone: [phone]   Fax:  [phone]  Physical Therapy Treatment  Patient Details  Name: James Hoffman MRN: [id number] Date of Birth: June 10, 1996 Referring Provider: Kenn File MD.  Encounter Date: 07/10/2017      PT End of Session - 07/10/17 1604    Visit Number 4   Number of Visits 16   Date for PT Re-Evaluation 08/24/17   PT Start Time 1520   PT Stop Time 1610   PT Time Calculation (min) 50 min      Past Medical History:  Diagnosis Date  . Adrenal insufficiency ([place])   . 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 Hydrocephalus   . Thyroid disease     Past Surgical History:  Procedure Laterality Date  . brain tumor removal  August 2012    There were no vitals filed for this visit.      Subjective Assessment - 07/10/17 1531    Subjective Not feeling to good today. The last week my RT leg and Rt arm have had some numbness in them. I'm gonna tell the Dr. when I see Him   Limitations Walking   Patient Stated Goals Get stronger and walk better.   Currently in Pain? Yes   Pain Score 2    Pain Location Hip   Pain Orientation Left   Pain Descriptors / Indicators Discomfort   Pain Type Surgical pain   Pain Onset More than a month ago                         [facility] Adult PT Treatment/Exercise - 07/10/17 0001      Ambulation/Gait   Ambulation Distance (Feet) 80 Feet   Assistive device Rolling walker   Gait Pattern Decreased step length - right;Decreased step length - left;Decreased hip/knee flexion - right;Decreased hip/knee flexion - left;Decreased dorsiflexion - right;Decreased dorsiflexion - left;Right foot flat;Left foot flat;Scissoring;Ataxic;Poor foot clearance - left;Poor foot clearance - right  Right HHA requited for ambulation.   Gait Comments CGA     Exercises   Exercises Knee/Hip     Knee/Hip Exercises: Aerobic   Nustep  L5, seat   x10 mn     Knee/Hip Exercises: Seated   Long Arc Quad Strengthening;Both;10 reps;3 sets   Long Arc Quad Weight --  5# on RT, 3# on LT     Knee/Hip Exercises: Supine   Bridges Strengthening;Both;2 sets;10 reps   Straight Leg Raises Both;10 reps;AROM;3 sets  AROM with RLE   Other Supine Knee/Hip Exercises B ankle pumps in supine x20 reps     Knee/Hip Exercises: Sidelying   Hip ABduction Left;AROM;AAROM;10 reps;3 sets  pain in LT hip                  PT Short Term Goals - 06/25/17 1712      PT SHORT TERM GOAL #1   Title STG's=LTG's.           PT Long Term Goals - 07/05/17 1609      PT LONG TERM GOAL #1   Title Ind with an advanced HEP.   Time 8   Period Weeks   Status On-going     PT LONG TERM GOAL #2   Title Increase bilateral LE strength to a solid 4+/5.   Time 8   Period Weeks   Status On-going     PT LONG  TERM GOAL #3   Title Sit to stand x 5 with SBA.   Time 8   Period Weeks   Status Achieved     PT LONG TERM GOAL #4   Title Walk in clinic with CGA/SBA with a cane 500 feet.   Time 8   Period Weeks   Status On-going               Plan - 07/10/17 1610    Clinical Impression Statement Pt arrived today not feeling well with low energy levels. He also reports some numbness in his RT arm and leg that he is going to discuss with MD. Lianne Moris was mainly performed in sitting and  lying down as per pt request. He did well with all exs except sidelying abduction and needed assistance . Pt was able to perform SLR without assist today Lt LE.Marland Kitchen       Patient will benefit from skilled therapeutic intervention in order to improve the following deficits and impairments:  Abnormal gait, Decreased activity tolerance, Decreased strength, Decreased coordination, Decreased balance, Pain  Visit Diagnosis: Unsteadiness on feet  Muscle weakness (generalized)  Ataxic gait  Unsteadiness  Abnormality of gait  Weakness of both lower  extremities     Problem List Patient Active Problem List   Diagnosis Date Noted  . Gait instability 12/19/2016  . Hypogonadism, male 07/19/2016  . Normocytic anemia 04/14/2016  . Muscular deconditioning 09/17/2015  . Other specified hypothyroidism 09/09/2015  . Medullary carcinoma ([place]) 09/09/2015  . Adrenal insufficiency ([place]) 09/09/2015  . Hypotension 09/09/2015  . Hyponatremia 09/09/2015  . Hypokalemia 09/09/2015    Dodge Ator,[name], PTA 07/10/2017, 4:29 PM  [facility] [place], [place], [address] Phone: [phone]   Fax:  [phone]  Name: James Hoffman MRN: [id number] Date of Birth: Oct 07, 1996

## 2017-07-12 ENCOUNTER — Encounter: Payer: Medicare Other | Admitting: *Deleted

## 2017-07-17 ENCOUNTER — Encounter: Payer: Medicare Other | Admitting: *Deleted

## 2017-07-19 ENCOUNTER — Ambulatory Visit: Payer: Medicare Other | Attending: Family Medicine | Admitting: Physical Therapy

## 2017-07-19 DIAGNOSIS — R2681 Unsteadiness on feet: Secondary | ICD-10-CM | POA: Insufficient documentation

## 2017-07-19 DIAGNOSIS — M6281 Muscle weakness (generalized): Secondary | ICD-10-CM | POA: Insufficient documentation

## 2017-07-19 DIAGNOSIS — R260 Ataxic gait: Secondary | ICD-10-CM | POA: Diagnosis not present

## 2017-07-19 NOTE — Therapy (Addendum)
[facility] [place], [place], [address] Phone: [phone]   Fax:  [phone]  Physical Therapy Treatment  Patient Details  Name: James Hoffman MRN: [id number] Date of Birth: 05-23-1996 Referring Provider: Kenn File MD.  Encounter Date: 07/19/2017      PT End of Session - 07/19/17 1658    Visit Number 5   Number of Visits 16   Date for PT Re-Evaluation 08/24/17   PT Start Time 0408   PT Stop Time 2725  Patient limited by fatigue.   PT Time Calculation (min) 21 min   Activity Tolerance Patient limited by fatigue   Behavior During Therapy [facility] for tasks assessed/performed      Past Medical History:  Diagnosis Date  . Adrenal insufficiency ([place])   . 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 Hydrocephalus   . Thyroid disease     Past Surgical History:  Procedure Laterality Date  . brain tumor removal  August 2012    There were no vitals filed for this visit.      Subjective Assessment - 07/19/17 1657    Subjective Not feeling good today.   Pain Score 4    Pain Location Hip   Pain Orientation Left   Pain Descriptors / Indicators Discomfort   Pain Type Surgical pain                         [facility] Adult PT Treatment/Exercise - 07/19/17 0001      Exercises   Exercises Knee/Hip     Knee/Hip Exercises: Aerobic   Nustep Level 5 x 10 minutes.             Balance Exercises - 07/19/17 1701      Balance Exercises: Standing   Other Standing Exercises AIRE[employer] balance pad in parallel bars x 2 minutes.             PT Short Term Goals - 06/25/17 1712      PT SHORT TERM GOAL #1   Title STG's=LTG's.           PT Long Term Goals - 07/05/17 1609      PT LONG TERM GOAL #1   Title Ind with an advanced HEP.   Time 8   Period Weeks   Status On-going     PT LONG TERM GOAL #2   Title Increase bilateral LE strength to a solid 4+/5.   Time 8   Period Weeks   Status On-going     PT LONG TERM GOAL #3   Title Sit to stand x 5 with SBA.   Time 8   Period Weeks   Status Achieved     PT LONG TERM GOAL #4   Title Walk in clinic with CGA/SBA with a cane 500 feet.   Time 8   Period Weeks   Status On-going               Plan - 07/19/17 1658    Clinical Impression Statement Patient unable to complete full treatment today to to c/o fatigue and dizziness.      Patient will benefit from skilled therapeutic intervention in order to improve the following deficits and impairments:  Abnormal gait, Decreased activity tolerance, Decreased strength, Decreased coordination, Decreased balance, Pain  Visit Diagnosis: Unsteadiness on feet  Muscle weakness (generalized)  Ataxic gait     Problem List Patient Active Problem List  Diagnosis Date Noted  . Gait instability 12/19/2016  . Hypogonadism, male 07/19/2016  . Normocytic anemia 04/14/2016  . Muscular deconditioning 09/17/2015  . Other specified hypothyroidism 09/09/2015  . 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 Adrenal insufficiency ([place]) 09/09/2015  . Hypotension 09/09/2015  . Hyponatremia 09/09/2015  . Hypokalemia 09/09/2015   PHYSICAL THERAPY DISCHARGE SUMMARY  Visits from Start of Care: 5.  Current functional level related to goals / functional outcomes: See above.   Remaining deficits: Patient did not return to PT.   Education / Equipment: HEP. Plan: Patient agrees to discharge.  Patient goals were partially met. Patient is being discharged due to not returning since the last visit.  ?????       Cordarrell Sane, [place] MPT 07/19/2017, 5:03 PM  [facility] [address] [place], [place], [address] Phone: [phone]   Fax:  [phone]  Name: HASAAN RADDE MRN: [id number] Date of Birth: [birth year]

## 2017-07-24 ENCOUNTER — Encounter: Payer: Medicare Other | Admitting: *Deleted

## 2017-08-02 ENCOUNTER — Encounter: Payer: Medicare Other | Admitting: *Deleted

## 2017-08-06 DIAGNOSIS — M810 Age-related osteoporosis without current pathological fracture: Secondary | ICD-10-CM | POA: Diagnosis not present

## 2017-08-13 ENCOUNTER — Ambulatory Visit (INDEPENDENT_AMBULATORY_CARE_PROVIDER_SITE_OTHER): Payer: Medicare Other | Admitting: Family Medicine

## 2017-08-13 ENCOUNTER — Encounter: Payer: Self-pay | Admitting: Family Medicine

## 2017-08-13 VITALS — BP 111/77 | HR 110 | Temp 97.9°F | Ht 70.0 in | Wt 144.0 lb

## 2017-08-13 DIAGNOSIS — L03311 Cellulitis of abdominal wall: Secondary | ICD-10-CM | POA: Diagnosis not present

## 2017-08-13 DIAGNOSIS — L0231 Cutaneous abscess of buttock: Secondary | ICD-10-CM

## 2017-08-13 DIAGNOSIS — L03317 Cellulitis of buttock: Secondary | ICD-10-CM

## 2017-08-13 MED ORDER — CEFTRIAXONE SODIUM 1 G IJ SOLR: 1.0000 g | Freq: Once | INTRAMUSCULAR | Status: AC

## 2017-08-13 MED ORDER — DOXYCYCLINE HYCLATE 100 MG PO TABS: 100.0000 mg | ORAL_TABLET | Freq: Two times a day (BID) | ORAL | 0 refills | Status: DC

## 2017-08-13 MED ORDER — CEFTRIAXONE SODIUM 1 G IJ SOLR
1.0000 g | Freq: Once | INTRAMUSCULAR | Status: AC
  Administered 2017-08-13: 1 g via INTRAMUSCULAR

## 2017-08-13 NOTE — Patient Instructions (Addendum)
Great to see you!  Start antibiotics tonight.  If he is not able to tolerate antibiotics, develops fevers, or has difficulty eating or drinking please taken to the hospital.   Cellulitis, Adult Cellulitis is a skin infection. The infected area is usually red and sore. This condition occurs most often in the arms and lower legs. It is very important to get treated for this condition. Follow these instructions at home:  Take over-the-counter and prescription medicines only as told by your doctor.  If you were prescribed an antibiotic medicine, take it as told by your doctor. Do not stop taking the antibiotic even if you start to feel better.  Drink enough fluid to keep your pee (urine) clear or pale yellow.  Do not touch or rub the infected area.  Raise (elevate) the infected area above the level of your heart while you are sitting or lying down.  Place warm or cold wet cloths (warm or cold compresses) on the infected area. Do this as told by your doctor.  Keep all follow-up visits as told by your doctor. This is important. These visits let your doctor make sure your infection is not getting worse. Contact a doctor if:  You have a fever.  Your symptoms do not get better after 1-2 days of treatment.  Your bone or joint under the infected area starts to hurt after the skin has healed.  Your infection comes back. This can happen in the same area or another area.  You have a swollen bump in the infected area.  You have new symptoms.  You feel ill and also have muscle aches and pains. Get help right away if:  Your symptoms get worse.  You feel very sleepy.  You throw up (vomit) or have watery poop (diarrhea) for a long time.  There are red streaks coming from the infected area.  Your red area gets larger.  Your red area turns darker. This information is not intended to replace advice given to you by your health care provider. Make sure you discuss any questions you have  with your health care provider. Document Released: 04/17/2008 Document Revised: 04/06/2016 Document Reviewed: 09/08/2015 Elsevier Interactive Patient Education  2018 [employer].

## 2017-08-13 NOTE — Progress Notes (Signed)
   HPI  Patient presents today with infection.  His mother is with him, she states that his symptoms have been present for about one week. She states that she's been doing sitz baths, apply Neosporin and alcohol to clean it. She's also been applying bandages.  He has nausea tonight, however he's tolerating food and fluids in general. He requests pain medication which I declined.  He has 2 lesions, one on the right abdomen and one on the left buttock. Both of them have drained at times. They're very painful  PMH: Smoking status noted ROS: Per HPI  Objective: BP 111/77   Pulse (!) 110   Temp 97.9 F (36.6 C) (Oral)   Ht 5\' 10"  (1.778 m)   Wt 144 lb (65.3 kg)   BMI 20.66 kg/m  Gen: NAD, alert, cooperative with exam HEENT: NCAT CV: RRR, good S1/S2, no murmur Resp: CTABL, no wheezes, non-labored Ext: No edema, warm Neuro: Alert and oriented, No gross deficits  Skin Right abdomen with erythematous indurated area approximately 3 cm x 4 cm Left buttock next to the gluteal cleft has 1-1/2 cm in diameter circular lesion with approximately 2 cm area of induration underlying. No area of fluctuance for either lesion, there is evidence of drainage in both lesions  Assessment and plan:  # Cellulitis of the abdominal wall, cellulitis of the left buttock Considering patient's heart rate and nausea I have treated him with IM Rocephin tonight Treating with doxycycline to be started tonight. I discussed multiple red flags with his mother and him, primarily if he cannot tolerate oral antibiotics, develops fever, or has worsening symptoms I recommended he seen in the emergency room. Low threshold for return if symptoms worsen   Meds ordered this encounter  Medications  . cefTRIAXone (ROCEPHIN) injection 1 g  . doxycycline (VIBRA-TABS) 100 MG tablet    Sig: Take 1 tablet (100 mg total) by mouth 2 (two) times daily. 1 po bid    Dispense:  20 tablet    Refill:  0  . cefTRIAXone (ROCEPHIN)  injection 1 g    Laroy Apple, MD [place] 08/13/2017, 7:24 PM

## 2017-08-16 ENCOUNTER — Encounter: Payer: Medicare Other | Admitting: *Deleted

## 2017-08-20 ENCOUNTER — Other Ambulatory Visit: Payer: Self-pay | Admitting: "Endocrinology

## 2017-08-20 ENCOUNTER — Telehealth: Payer: Self-pay

## 2017-08-20 NOTE — Telephone Encounter (Signed)
Pts grandmother called requesting refills on Prednisone and testosterone. Pt has not been here since 11-01-2016. She states that he will have labs this week and schedule appt.

## 2017-08-20 NOTE — Telephone Encounter (Signed)
That is the correct decision.

## 2017-08-21 NOTE — Telephone Encounter (Signed)
Called pt no answer °

## 2017-08-27 ENCOUNTER — Other Ambulatory Visit: Payer: Self-pay | Admitting: Family Medicine

## 2017-08-27 NOTE — Telephone Encounter (Signed)
Dr. Dorris Fetch usually prescribes.  He has to be seen in awhile & has to have labs there before they will refill.  Since just seen here would like to seen if this can be refilled Pt uses [facility]

## 2017-08-27 NOTE — Telephone Encounter (Signed)
Lmtcb regarding his prednisone. Last filled was 11/01/16 by Dr. Dorris Fetch

## 2017-08-28 MED ORDER — PREDNISONE 10 MG PO TABS: 10.0000 mg | ORAL_TABLET | Freq: Every day | ORAL | 0 refills | Status: DC

## 2017-08-28 NOTE — Telephone Encounter (Signed)
1 month supply sent, needs to see Endo as they request.   Laroy Apple, MD [facility] 08/28/2017, 7:45 AM

## 2017-08-30 NOTE — Telephone Encounter (Signed)
Mother aware

## 2017-09-20 ENCOUNTER — Other Ambulatory Visit: Payer: Self-pay | Admitting: "Endocrinology

## 2017-09-20 ENCOUNTER — Other Ambulatory Visit: Payer: Medicare Other

## 2017-09-20 DIAGNOSIS — E039 Hypothyroidism, unspecified: Secondary | ICD-10-CM | POA: Diagnosis not present

## 2017-09-20 DIAGNOSIS — E291 Testicular hypofunction: Secondary | ICD-10-CM

## 2017-09-20 DIAGNOSIS — E2740 Unspecified adrenocortical insufficiency: Secondary | ICD-10-CM | POA: Diagnosis not present

## 2017-09-21 LAB — SPECIMEN STATUS REPORT

## 2017-09-21 LAB — COMPREHENSIVE METABOLIC PANEL
ALK PHOS: 98 IU/L (ref 39–117)
ALT: 14 IU/L (ref 0–44)
AST: 11 IU/L (ref 0–40)
Albumin/Globulin Ratio: 2 (ref 1.2–2.2)
Albumin: 4.2 g/dL (ref 3.5–5.5)
BUN/Creatinine Ratio: 10 (ref 9–20)
BUN: 6 mg/dL (ref 6–20)
Bilirubin Total: <0.2 mg/dL (ref 0.0–1.2)
CHLORIDE: 91 mmol/L — AB (ref 96–106)
CO2: 27 mmol/L (ref 20–29)
CREATININE: 0.62 mg/dL — AB (ref 0.76–1.27)
Calcium: 9.7 mg/dL (ref 8.7–10.2)
GFR calc Af Amer: 164 mL/min/{1.73_m2} (ref >59)
GFR calc non Af Amer: 142 mL/min/{1.73_m2} (ref >59)
GLOBULIN, TOTAL: 2.1 g/dL (ref 1.5–4.5)
Glucose: 90 mg/dL (ref 65–99)
POTASSIUM: 4.3 mmol/L (ref 3.5–5.2)
SODIUM: 133 mmol/L — AB (ref 134–144)
Total Protein: 6.3 g/dL (ref 6.0–8.5)

## 2017-09-21 LAB — TSH: TSH: 0.135 u[IU]/mL — ABNORMAL LOW (ref 0.450–4.500)

## 2017-09-21 LAB — T4, FREE: FREE T4: 1.1 ng/dL (ref 0.82–1.77)

## 2017-09-21 LAB — TESTOSTERONE

## 2017-09-23 ENCOUNTER — Other Ambulatory Visit: Payer: Self-pay | Admitting: Family Medicine

## 2017-09-24 NOTE — Telephone Encounter (Signed)
Last seen 08/13/17  Dr Wendi Snipes

## 2017-10-13 ENCOUNTER — Ambulatory Visit (INDEPENDENT_AMBULATORY_CARE_PROVIDER_SITE_OTHER): Payer: Medicare Other | Admitting: Family Medicine

## 2017-10-13 VITALS — BP 95/65 | HR 99 | Temp 97.2°F | Ht 70.0 in | Wt 144.0 lb

## 2017-10-13 DIAGNOSIS — J40 Bronchitis, not specified as acute or chronic: Secondary | ICD-10-CM | POA: Diagnosis not present

## 2017-10-13 DIAGNOSIS — J329 Chronic sinusitis, unspecified: Secondary | ICD-10-CM | POA: Diagnosis not present

## 2017-10-13 MED ORDER — AMOXICILLIN-POT CLAVULANATE 875-125 MG PO TABS: 1.0000 | ORAL_TABLET | Freq: Two times a day (BID) | ORAL | 0 refills | Status: DC

## 2017-10-13 MED ORDER — GUAIFENESIN-CODEINE 100-10 MG/5ML PO SYRP: ORAL_SOLUTION | ORAL | 0 refills | Status: DC

## 2017-10-13 NOTE — Progress Notes (Signed)
Chief Complaint  Patient presents with  . Cough  . Nasal Congestion    HPI  Patient presents today for Patient presents with upper respiratory congestion. Rhinorrhea that is frequently purulent. There is moderate sore throat. Patient reports coughing frequently as well.  Green sputum noted. There is no fever, chills or sweats. The patient denies being short of breath. Onset was 3-5 days ago. Gradually worsening. Tried OTCs without improvement.  PMH: Smoking status noted ROS: Per HPI  Objective: BP 95/65   Pulse 99   Temp (!) 97.2 F (36.2 C) (Oral)   Ht 5\' 10"  (1.778 m)   Wt 144 lb (65.3 kg)   BMI 20.66 kg/m  Gen: NAD, alert, cooperative with exam HEENT: NCAT, Nasal passages swollen, red TMS RED CV: RRR, good S1/S2, no murmur Resp: Bronchitis changes with scattered wheezes, non-labored Ext: No edema, warm Neuro: Alert and oriented, No gross deficits  Assessment and plan:  1. Sinobronchitis     Meds ordered this encounter  Medications  . guaiFENesin-codeine (CHERATUSSIN AC) 100-10 MG/5ML syrup    Sig: I tsp every 4 hours prn cough    Dispense:  120 mL    Refill:  0  . amoxicillin-clavulanate (AUGMENTIN) 875-125 MG tablet    Sig: Take 1 tablet by mouth 2 (two) times daily.    Dispense:  20 tablet    Refill:  0      Follow up as needed.  Claretta Fraise, MD

## 2017-10-25 ENCOUNTER — Other Ambulatory Visit: Payer: Self-pay | Admitting: Family Medicine

## 2017-10-25 NOTE — Telephone Encounter (Signed)
Last seen 10/13/17  Dr Livia Snellen

## 2017-10-28 ENCOUNTER — Emergency Department (HOSPITAL_COMMUNITY): Payer: Medicare Other

## 2017-10-28 ENCOUNTER — Other Ambulatory Visit: Payer: Self-pay

## 2017-10-28 ENCOUNTER — Encounter (HOSPITAL_COMMUNITY): Payer: Self-pay | Admitting: *Deleted

## 2017-10-28 ENCOUNTER — Emergency Department (HOSPITAL_COMMUNITY)
Admission: EM | Admit: 2017-10-28 | Discharge: 2017-10-28 | Disposition: A | Discharge status: Home / Self Care | Payer: Medicare Other | Attending: Emergency Medicine | Admitting: Emergency Medicine

## 2017-10-28 DIAGNOSIS — R51 Headache: Secondary | ICD-10-CM | POA: Insufficient documentation

## 2017-10-28 DIAGNOSIS — F17210 Nicotine dependence, cigarettes, uncomplicated: Secondary | ICD-10-CM | POA: Insufficient documentation

## 2017-10-28 DIAGNOSIS — Z79899 Other long term (current) drug therapy: Secondary | ICD-10-CM | POA: Insufficient documentation

## 2017-10-28 DIAGNOSIS — R519 Headache, unspecified: Secondary | ICD-10-CM

## 2017-10-28 DIAGNOSIS — R05 Cough: Secondary | ICD-10-CM | POA: Diagnosis not present

## 2017-10-28 HISTORY — DX: Age-related osteoporosis without current pathological fracture: M81.0

## 2017-10-28 MED ORDER — MAGNESIUM SULFATE 2 GM/50ML IV SOLN
2.0000 g | Freq: Once | INTRAVENOUS | Status: AC
  Administered 2017-10-28: 2 g via INTRAVENOUS
  Filled 2017-10-28: qty 50

## 2017-10-28 MED ORDER — SODIUM CHLORIDE 0.9 % IV BOLUS (SEPSIS)
1000.0000 mL | Freq: Once | INTRAVENOUS | Status: AC
  Administered 2017-10-28: 1000 mL via INTRAVENOUS

## 2017-10-28 MED ORDER — METOCLOPRAMIDE HCL 5 MG/ML IJ SOLN
5.0000 mg | Freq: Once | INTRAMUSCULAR | Status: AC
  Administered 2017-10-28: 5 mg via INTRAVENOUS
  Filled 2017-10-28: qty 2

## 2017-10-28 MED ORDER — SODIUM CHLORIDE 0.9 % IV BOLUS (SEPSIS)
500.0000 mL | Freq: Once | INTRAVENOUS | Status: AC
  Administered 2017-10-28: 500 mL via INTRAVENOUS

## 2017-10-28 MED ORDER — DIPHENHYDRAMINE HCL 50 MG/ML IJ SOLN
12.5000 mg | Freq: Once | INTRAMUSCULAR | Status: AC
  Administered 2017-10-28: 12.5 mg via INTRAVENOUS
  Filled 2017-10-28: qty 1

## 2017-10-28 NOTE — ED Triage Notes (Signed)
Mother reports that pt had a brain tumor when he was 14. Mother states pt began having a headache x 2 days and today he has not eaten anything ans drank enough water to take his morning meds.

## 2017-10-28 NOTE — ED Provider Notes (Signed)
[facility] EMERGENCY DEPARTMENT Provider Note   CSN: [id number] Arrival date & time: 10/28/17  0055  Time seen 01:39 AM   History   Chief Complaint Chief Complaint  Patient presents with  . Headache    HPI James Hoffman is a 21 y.o. male.  HPI patient is here with his mother and sister, he states he started having a holoacranial headache 2-3 days ago that he describes as sharp and constant.  Nothing makes the headache hurt more, nothing makes it hurt less.  His mother was given him Tylenol, NyQuil, Tylenol Cold, and he took a hydrocodone 2 days ago without relief of the headache.  He has not had a headache before.  He has had nausea but no vomiting.  He is also had a cough for 2 weeks that is improving, there was no documented fever.  Patient had a brain tumor, medulloblastoma removed in 2011 and has a shunt on it.  He is followed at [facility].  Mother states they "messed up his sinus cavity" when he had a surgery.  She states he gets lots of sinus problems now.  Mother states they have electric heat so no concern for carbon monoxide.  Mother and several other family members have recently had pneumonia or respiratory problems.  PCP Timmothy Euler, MD   Past Medical History:  Diagnosis Date  . Adrenal insufficiency ([place])   . 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 Hydrocephalus   . Osteoporosis   . Thyroid disease     Patient Active Problem List   Diagnosis Date Noted  . Gait instability 12/19/2016  . Hypogonadism, male 07/19/2016  . Normocytic anemia 04/14/2016  . Muscular deconditioning 09/17/2015  . Other specified hypothyroidism 09/09/2015  . Medullary carcinoma ([place]) 09/09/2015  . Adrenal insufficiency ([place]) 09/09/2015  . Hypotension 09/09/2015  . Hyponatremia 09/09/2015  . Hypokalemia 09/09/2015    Past Surgical History:  Procedure Laterality Date  . brain tumor removal  August 2012  . HIP SURGERY    . teeth removal         Home  Medications    Prior to Admission medications   Medication Sig Start Date End Date Taking? Authorizing Provider  amoxicillin-clavulanate (AUGMENTIN) 875-125 MG tablet Take 1 tablet by mouth 2 (two) times daily. 10/13/17  Yes Stacks, Cletus Gash, MD  levothyroxine (SYNTHROID, LEVOTHROID) 88 MCG tablet TAKE 1 TABLET (88 MCG TOTAL) BY MOUTH DAILY BEFORE BREAKFAST. 05/28/17  Yes Cassandria Anger, MD  Testosterone (ANDROGEL PUMP) 20.25 MG/ACT (1.62%) GEL One pump on each shoulder every morning 11/01/16  Yes Nida, Marella Chimes, MD  doxycycline (VIBRA-TABS) 100 MG tablet Take 1 tablet (100 mg total) by mouth 2 (two) times daily. 1 po bid 08/13/17   Timmothy Euler, MD  guaiFENesin-codeine [facility]) 100-10 MG/5ML syrup I tsp every 4 hours prn cough 10/13/17   Claretta Fraise, MD  predniSONE (DELTASONE) 10 MG tablet TAKE 1 TABLET (10 MG TOTAL) BY MOUTH DAILY WITH BREAKFAST. 10/25/17   Timmothy Euler, MD    Family History No family history on file.  Social History Social History   Tobacco Use  . Smoking status: Current Every Day Smoker    Packs/day: 0.25    Types: Cigarettes    Start date: 11/25/2012  . Smokeless tobacco: Never Used  Substance Use Topics  . Alcohol use: No  . Drug use: No  lives at home Lives with mother   Allergies  Patient has no known allergies.   Review of Systems Review of Systems  All other systems reviewed and are negative.    Physical Exam Updated Vital Signs BP (!) 123/94   Pulse 99   Temp 98.3 F (36.8 C) (Oral)   Resp 16   Ht 5\' 10"  (1.778 m)   Wt 65.3 kg (144 lb)   SpO2 98%   BMI 20.66 kg/m   Vital signs normal    Physical Exam  Constitutional: He is oriented to person, place, and time. He appears well-developed and well-nourished.  Non-toxic appearance. He does not appear ill. No distress.  HENT:  Head: Normocephalic and atraumatic.  Right Ear: External ear normal.  Left Ear: External ear normal.  Nose: Nose normal. No  mucosal edema or rhinorrhea.  Mouth/Throat: Oropharynx is clear and moist and mucous membranes are normal. No dental abscesses or uvula swelling.  Eyes: Conjunctivae and EOM are normal. Pupils are equal, round, and reactive to light.  Cataract in the left eye, when I shown the light in his eyes to do pupillary reflex he did state it made his headache hurt more  Neck: Normal range of motion and full passive range of motion without pain. Neck supple.  Cardiovascular: Normal rate, regular rhythm and normal heart sounds. Exam reveals no gallop and no friction rub.  No murmur heard. Pulmonary/Chest: Effort normal and breath sounds normal. No respiratory distress. He has no wheezes. He has no rhonchi. He has no rales. He exhibits no tenderness and no crepitus.  Abdominal: Soft. Normal appearance and bowel sounds are normal. He exhibits no distension. There is no tenderness. There is no rebound and no guarding.  Musculoskeletal: Normal range of motion. He exhibits no edema or tenderness.  Moves all extremities well.   Neurological: He is alert and oriented to person, place, and time. He has normal strength. No cranial nerve deficit.  Skin: Skin is warm, dry and intact. No rash noted. No erythema. No pallor.  Psychiatric: He has a normal mood and affect. His speech is normal and behavior is normal. His mood appears not anxious.  Nursing note and vitals reviewed.    ED Treatments / Results  Labs (all labs ordered are listed, but only abnormal results are displayed) Labs Reviewed - No data to display  EKG  EKG Interpretation None       Radiology Dg Chest 2 View  Result Date: 10/28/2017 CLINICAL DATA:  Initial evaluation for acute cough for 2 weeks. EXAM: CHEST  2 VIEW COMPARISON:  Prior radiograph from 09/09/2015. FINDINGS: Cardiac and mediastinal silhouettes are stable in size and contour, and remain within normal limits. Lungs are hypoinflated with elevation of the left hemidiaphragm.  Subtle mild peribronchial thickening, which may reflect sequelae of acute bronchiolitis given provided history of cough. No consolidative opacity to suggest pneumonia. No pulmonary edema or pleural effusion. No pneumothorax. VP shunt catheter overlies the right neck and chest and is partially visualize within the left upper quadrant. No acute osseous abnormality. IMPRESSION: 1. Subtle mild diffuse peribronchial thickening, suggesting possible bronchiolitis given the history of cough. No consolidative opacity to suggest bronchopneumonia. 2. Elevation of the left hemidiaphragm. Electronically Signed   By: Jeannine Boga M.D.   On: 10/28/2017 02:46   Ct Head Wo Contrast  Result Date: 10/28/2017 CLINICAL DATA:  Acute headache. History of brainstem tumor post resection with VP shunt. EXAM: CT HEAD WITHOUT CONTRAST TECHNIQUE: Contiguous axial images were obtained from the base of the skull through the  vertex without intravenous contrast. COMPARISON:  Head CT 09/09/2015 FINDINGS: Brain: Ventriculostomy catheter from a right parietal approach unchanged in position with tip in the midline. No hydrocephalus. Chronic right subdural collection has slightly decreased in size currently 5 mm, previously 7 mm, with some dural calcifications. Thin isodense left subdural collection has also decreased in size currently 4 mm, previously 5 mm. No evidence of acute hemorrhage component. No intracranial hemorrhage elsewhere. Minimal encephalomalacia in the right frontal lobe is unchanged. No evidence of new intracranial mass on noncontrast CT. Postsurgical change in the posterior fossa, stable in appearance. Vascular: No hyperdense vessel. Skull: Postsurgical change in the posterior fossa. Chronic opacification of lower left mastoid air cells. Sinuses/Orbits: Paranasal sinuses are clear. Other: None. IMPRESSION: 1. Right parietal ventriculostomy catheter place, no hydrocephalus. 2. Thin bilateral subdural collections, chronic  and slightly decreased in size since 2016 exam. This can be seen in the setting of intracranial hypotension, however there is normal ventricular size and morphology. 3. Postsurgical change of the posterior fossa. Electronically Signed   By: Jeb Levering M.D.   On: 10/28/2017 02:58    Procedures Procedures (including critical care time)  Medications Ordered in ED Medications  sodium chloride 0.9 % bolus 1,000 mL (0 mLs Intravenous Stopped 10/28/17 0316)  sodium chloride 0.9 % bolus 500 mL (0 mLs Intravenous Stopped 10/28/17 0316)  metoCLOPramide (REGLAN) injection 5 mg (5 mg Intravenous Given 10/28/17 8502)  diphenhydrAMINE (BENADRYL) injection 12.5 mg (12.5 mg Intravenous Given 10/28/17 0211)  magnesium sulfate IVPB 2 g 50 mL (0 g Intravenous Stopped 10/28/17 0536)     Initial Impression / Assessment and Plan / ED Course  I have reviewed the triage vital signs and the nursing notes.  Pertinent labs & imaging results that were available during my care of the patient were reviewed by me and considered in my medical decision making (see chart for details).     Due to his history of brain tumor and because he has a shunt a CT scan was done of his brain.  An IV was inserted, mother was concerned that he was dehydrated and he was given IV fluids and IV migraine cocktail at low dose.  Patient CT scan is reassuring, there is no hydrocephalus to suggest the shunt is not functioning, there is the chronic subdural fluid collections which are improving over the past couple of years.  Recheck at 3:40 AM patient is sleeping soundly, he is very hard to get awake.  Is not clear whether he is doing better or not.  Finally mother was able to get him to wake up and he said his headache was still present but better.  He was then given IV magnesium.  Recheck at 5:30 AM when patient is awake and he states his headache is gone.  He will be discharged home.  Final Clinical Impressions(s) / ED Diagnoses    Final diagnoses:  Acute nonintractable headache, unspecified headache type    ED Discharge Orders    None      Plan discharge  Rolland Porter, MD, Barbette Or, MD 10/28/17 [phone]

## 2017-10-28 NOTE — Discharge Instructions (Signed)
Drink plenty of fluids.  Recheck as needed.

## 2017-10-30 DIAGNOSIS — R0902 Hypoxemia: Secondary | ICD-10-CM | POA: Diagnosis not present

## 2017-10-30 DIAGNOSIS — Z982 Presence of cerebrospinal fluid drainage device: Secondary | ICD-10-CM | POA: Diagnosis not present

## 2017-10-30 DIAGNOSIS — E2740 Unspecified adrenocortical insufficiency: Secondary | ICD-10-CM | POA: Diagnosis not present

## 2017-10-30 DIAGNOSIS — G9341 Metabolic encephalopathy: Secondary | ICD-10-CM | POA: Diagnosis not present

## 2017-10-30 DIAGNOSIS — R000 Tachycardia, unspecified: Secondary | ICD-10-CM | POA: Diagnosis not present

## 2017-10-30 DIAGNOSIS — R9431 Abnormal electrocardiogram [ECG] [EKG]: Secondary | ICD-10-CM | POA: Diagnosis not present

## 2017-10-30 DIAGNOSIS — R569 Unspecified convulsions: Secondary | ICD-10-CM | POA: Diagnosis not present

## 2017-10-30 DIAGNOSIS — R509 Fever, unspecified: Secondary | ICD-10-CM | POA: Diagnosis not present

## 2017-10-30 DIAGNOSIS — R0989 Other specified symptoms and signs involving the circulatory and respiratory systems: Secondary | ICD-10-CM | POA: Diagnosis not present

## 2017-10-30 DIAGNOSIS — R51 Headache: Secondary | ICD-10-CM | POA: Diagnosis not present

## 2017-10-30 DIAGNOSIS — G042 Bacterial meningoencephalitis and meningomyelitis, not elsewhere classified: Secondary | ICD-10-CM | POA: Diagnosis not present

## 2017-10-30 DIAGNOSIS — A419 Sepsis, unspecified organism: Secondary | ICD-10-CM | POA: Diagnosis not present

## 2017-10-30 DIAGNOSIS — R6521 Severe sepsis with septic shock: Secondary | ICD-10-CM | POA: Diagnosis not present

## 2017-10-30 DIAGNOSIS — J690 Pneumonitis due to inhalation of food and vomit: Secondary | ICD-10-CM | POA: Diagnosis not present

## 2017-10-30 DIAGNOSIS — Z049 Encounter for examination and observation for unspecified reason: Secondary | ICD-10-CM | POA: Diagnosis not present

## 2017-10-31 ENCOUNTER — Ambulatory Visit: Payer: Medicare Other | Admitting: Pediatrics

## 2017-10-31 DIAGNOSIS — I959 Hypotension, unspecified: Secondary | ICD-10-CM | POA: Diagnosis not present

## 2017-10-31 DIAGNOSIS — G9340 Encephalopathy, unspecified: Secondary | ICD-10-CM | POA: Diagnosis not present

## 2017-10-31 DIAGNOSIS — G9349 Other encephalopathy: Secondary | ICD-10-CM | POA: Diagnosis not present

## 2017-10-31 DIAGNOSIS — N189 Chronic kidney disease, unspecified: Secondary | ICD-10-CM | POA: Diagnosis not present

## 2017-10-31 DIAGNOSIS — E876 Hypokalemia: Secondary | ICD-10-CM | POA: Diagnosis not present

## 2017-10-31 DIAGNOSIS — K521 Toxic gastroenteritis and colitis: Secondary | ICD-10-CM | POA: Diagnosis not present

## 2017-10-31 DIAGNOSIS — C716 Malignant neoplasm of cerebellum: Secondary | ICD-10-CM | POA: Diagnosis not present

## 2017-10-31 DIAGNOSIS — G039 Meningitis, unspecified: Secondary | ICD-10-CM | POA: Diagnosis not present

## 2017-10-31 DIAGNOSIS — R51 Headache: Secondary | ICD-10-CM | POA: Diagnosis not present

## 2017-10-31 DIAGNOSIS — R918 Other nonspecific abnormal finding of lung field: Secondary | ICD-10-CM | POA: Diagnosis not present

## 2017-10-31 DIAGNOSIS — R569 Unspecified convulsions: Secondary | ICD-10-CM | POA: Diagnosis not present

## 2017-10-31 DIAGNOSIS — Z923 Personal history of irradiation: Secondary | ICD-10-CM | POA: Diagnosis not present

## 2017-10-31 DIAGNOSIS — E871 Hypo-osmolality and hyponatremia: Secondary | ICD-10-CM | POA: Diagnosis present

## 2017-10-31 DIAGNOSIS — R0902 Hypoxemia: Secondary | ICD-10-CM | POA: Diagnosis present

## 2017-10-31 DIAGNOSIS — G042 Bacterial meningoencephalitis and meningomyelitis, not elsewhere classified: Secondary | ICD-10-CM | POA: Diagnosis present

## 2017-10-31 DIAGNOSIS — Z7952 Long term (current) use of systemic steroids: Secondary | ICD-10-CM | POA: Diagnosis not present

## 2017-10-31 DIAGNOSIS — Z7982 Long term (current) use of aspirin: Secondary | ICD-10-CM | POA: Diagnosis not present

## 2017-10-31 DIAGNOSIS — R4182 Altered mental status, unspecified: Secondary | ICD-10-CM | POA: Diagnosis not present

## 2017-10-31 DIAGNOSIS — R197 Diarrhea, unspecified: Secondary | ICD-10-CM | POA: Diagnosis not present

## 2017-10-31 DIAGNOSIS — E039 Hypothyroidism, unspecified: Secondary | ICD-10-CM | POA: Diagnosis present

## 2017-10-31 DIAGNOSIS — J690 Pneumonitis due to inhalation of food and vomit: Secondary | ICD-10-CM | POA: Diagnosis present

## 2017-10-31 DIAGNOSIS — I67848 Other cerebrovascular vasospasm and vasoconstriction: Secondary | ICD-10-CM | POA: Diagnosis not present

## 2017-10-31 DIAGNOSIS — H548 Legal blindness, as defined in USA: Secondary | ICD-10-CM | POA: Diagnosis present

## 2017-10-31 DIAGNOSIS — A419 Sepsis, unspecified organism: Secondary | ICD-10-CM | POA: Diagnosis not present

## 2017-10-31 DIAGNOSIS — Z85841 Personal history of malignant neoplasm of brain: Secondary | ICD-10-CM | POA: Diagnosis not present

## 2017-10-31 DIAGNOSIS — G40909 Epilepsy, unspecified, not intractable, without status epilepticus: Secondary | ICD-10-CM | POA: Diagnosis present

## 2017-10-31 DIAGNOSIS — Z9221 Personal history of antineoplastic chemotherapy: Secondary | ICD-10-CM | POA: Diagnosis not present

## 2017-10-31 DIAGNOSIS — G932 Benign intracranial hypertension: Secondary | ICD-10-CM | POA: Diagnosis present

## 2017-10-31 DIAGNOSIS — R0989 Other specified symptoms and signs involving the circulatory and respiratory systems: Secondary | ICD-10-CM | POA: Diagnosis not present

## 2017-10-31 DIAGNOSIS — F17210 Nicotine dependence, cigarettes, uncomplicated: Secondary | ICD-10-CM | POA: Diagnosis present

## 2017-10-31 DIAGNOSIS — R110 Nausea: Secondary | ICD-10-CM | POA: Diagnosis not present

## 2017-10-31 DIAGNOSIS — R9401 Abnormal electroencephalogram [EEG]: Secondary | ICD-10-CM | POA: Diagnosis not present

## 2017-10-31 DIAGNOSIS — R6521 Severe sepsis with septic shock: Secondary | ICD-10-CM | POA: Diagnosis not present

## 2017-10-31 DIAGNOSIS — Z9889 Other specified postprocedural states: Secondary | ICD-10-CM | POA: Diagnosis not present

## 2017-10-31 DIAGNOSIS — G0481 Other encephalitis and encephalomyelitis: Secondary | ICD-10-CM | POA: Diagnosis not present

## 2017-10-31 DIAGNOSIS — R000 Tachycardia, unspecified: Secondary | ICD-10-CM | POA: Diagnosis not present

## 2017-10-31 DIAGNOSIS — E878 Other disorders of electrolyte and fluid balance, not elsewhere classified: Secondary | ICD-10-CM | POA: Diagnosis not present

## 2017-10-31 DIAGNOSIS — E2749 Other adrenocortical insufficiency: Secondary | ICD-10-CM | POA: Diagnosis not present

## 2017-10-31 DIAGNOSIS — E2740 Unspecified adrenocortical insufficiency: Secondary | ICD-10-CM | POA: Diagnosis present

## 2017-10-31 DIAGNOSIS — J9601 Acute respiratory failure with hypoxia: Secondary | ICD-10-CM | POA: Diagnosis not present

## 2017-10-31 DIAGNOSIS — R9431 Abnormal electrocardiogram [ECG] [EKG]: Secondary | ICD-10-CM | POA: Diagnosis not present

## 2017-10-31 DIAGNOSIS — Z982 Presence of cerebrospinal fluid drainage device: Secondary | ICD-10-CM | POA: Diagnosis not present

## 2017-10-31 DIAGNOSIS — R9402 Abnormal brain scan: Secondary | ICD-10-CM | POA: Diagnosis not present

## 2017-10-31 DIAGNOSIS — C760 Malignant neoplasm of head, face and neck: Secondary | ICD-10-CM | POA: Diagnosis not present

## 2017-10-31 DIAGNOSIS — T3695XA Adverse effect of unspecified systemic antibiotic, initial encounter: Secondary | ICD-10-CM | POA: Diagnosis not present

## 2017-10-31 DIAGNOSIS — G9341 Metabolic encephalopathy: Secondary | ICD-10-CM | POA: Diagnosis present

## 2017-10-31 DIAGNOSIS — E291 Testicular hypofunction: Secondary | ICD-10-CM | POA: Diagnosis present

## 2017-10-31 DIAGNOSIS — G009 Bacterial meningitis, unspecified: Secondary | ICD-10-CM | POA: Diagnosis not present

## 2017-10-31 DIAGNOSIS — R6520 Severe sepsis without septic shock: Secondary | ICD-10-CM | POA: Diagnosis not present

## 2017-11-07 ENCOUNTER — Encounter: Payer: Self-pay | Admitting: Family Medicine

## 2017-11-10 DIAGNOSIS — G911 Obstructive hydrocephalus: Secondary | ICD-10-CM | POA: Diagnosis not present

## 2017-11-10 DIAGNOSIS — G0490 Encephalitis and encephalomyelitis, unspecified: Secondary | ICD-10-CM | POA: Diagnosis not present

## 2017-11-10 DIAGNOSIS — G40909 Epilepsy, unspecified, not intractable, without status epilepticus: Secondary | ICD-10-CM | POA: Diagnosis not present

## 2017-11-10 DIAGNOSIS — E2740 Unspecified adrenocortical insufficiency: Secondary | ICD-10-CM | POA: Diagnosis not present

## 2017-11-10 DIAGNOSIS — A419 Sepsis, unspecified organism: Secondary | ICD-10-CM | POA: Diagnosis not present

## 2017-11-10 DIAGNOSIS — N189 Chronic kidney disease, unspecified: Secondary | ICD-10-CM | POA: Diagnosis not present

## 2017-11-12 DIAGNOSIS — G039 Meningitis, unspecified: Secondary | ICD-10-CM | POA: Diagnosis not present

## 2017-11-12 DIAGNOSIS — N189 Chronic kidney disease, unspecified: Secondary | ICD-10-CM | POA: Diagnosis not present

## 2017-11-12 DIAGNOSIS — G40909 Epilepsy, unspecified, not intractable, without status epilepticus: Secondary | ICD-10-CM | POA: Diagnosis not present

## 2017-11-12 DIAGNOSIS — E2740 Unspecified adrenocortical insufficiency: Secondary | ICD-10-CM | POA: Diagnosis not present

## 2017-11-12 DIAGNOSIS — G911 Obstructive hydrocephalus: Secondary | ICD-10-CM | POA: Diagnosis not present

## 2017-11-12 DIAGNOSIS — G0490 Encephalitis and encephalomyelitis, unspecified: Secondary | ICD-10-CM | POA: Diagnosis not present

## 2017-11-12 DIAGNOSIS — A419 Sepsis, unspecified organism: Secondary | ICD-10-CM | POA: Diagnosis not present

## 2017-11-14 DIAGNOSIS — E2740 Unspecified adrenocortical insufficiency: Secondary | ICD-10-CM | POA: Diagnosis not present

## 2017-11-14 DIAGNOSIS — N189 Chronic kidney disease, unspecified: Secondary | ICD-10-CM | POA: Diagnosis not present

## 2017-11-14 DIAGNOSIS — G40909 Epilepsy, unspecified, not intractable, without status epilepticus: Secondary | ICD-10-CM | POA: Diagnosis not present

## 2017-11-14 DIAGNOSIS — G0490 Encephalitis and encephalomyelitis, unspecified: Secondary | ICD-10-CM | POA: Diagnosis not present

## 2017-11-14 DIAGNOSIS — A419 Sepsis, unspecified organism: Secondary | ICD-10-CM | POA: Diagnosis not present

## 2017-11-14 DIAGNOSIS — G911 Obstructive hydrocephalus: Secondary | ICD-10-CM | POA: Diagnosis not present

## 2017-11-15 DIAGNOSIS — G911 Obstructive hydrocephalus: Secondary | ICD-10-CM | POA: Diagnosis not present

## 2017-11-15 DIAGNOSIS — N189 Chronic kidney disease, unspecified: Secondary | ICD-10-CM | POA: Diagnosis not present

## 2017-11-15 DIAGNOSIS — E2740 Unspecified adrenocortical insufficiency: Secondary | ICD-10-CM | POA: Diagnosis not present

## 2017-11-15 DIAGNOSIS — G0490 Encephalitis and encephalomyelitis, unspecified: Secondary | ICD-10-CM | POA: Diagnosis not present

## 2017-11-15 DIAGNOSIS — A419 Sepsis, unspecified organism: Secondary | ICD-10-CM | POA: Diagnosis not present

## 2017-11-15 DIAGNOSIS — G40909 Epilepsy, unspecified, not intractable, without status epilepticus: Secondary | ICD-10-CM | POA: Diagnosis not present

## 2017-11-21 ENCOUNTER — Encounter: Payer: Self-pay | Admitting: Family

## 2017-11-21 ENCOUNTER — Ambulatory Visit (INDEPENDENT_AMBULATORY_CARE_PROVIDER_SITE_OTHER): Payer: Medicare Other | Admitting: Family

## 2017-11-21 VITALS — BP 114/85 | HR 86 | Temp 96.3°F

## 2017-11-21 DIAGNOSIS — Z09 Encounter for follow-up examination after completed treatment for conditions other than malignant neoplasm: Secondary | ICD-10-CM

## 2017-11-21 DIAGNOSIS — G0490 Encephalitis and encephalomyelitis, unspecified: Secondary | ICD-10-CM

## 2017-11-21 DIAGNOSIS — R29898 Other symptoms and signs involving the musculoskeletal system: Secondary | ICD-10-CM

## 2017-11-21 DIAGNOSIS — R569 Unspecified convulsions: Secondary | ICD-10-CM | POA: Diagnosis not present

## 2017-11-21 NOTE — Progress Notes (Signed)
   Subjective:    Patient ID: James Hoffman, male    DOB: January 03, 1996, 22 y.o.   MRN: [id number]  HPI Pt presents to the office today for hospital follow up. 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 Pt had seizure "likely provoked due to history of medulloblastoma".   PT was treated with IV vancomycin and ceftriaxone and discharged home with a PICC line and completed both of them on 11/15/16. Mother reports pt is doing much better. Denies any seizures or fevers. States he has a slight headache.    PT has follow up with Neurologists March 13.   [facility] notes reviewed  Review of Systems  Constitutional: Positive for fatigue.  Neurological: Positive for headaches.  All other systems reviewed and are negative.      Objective:   Physical Exam  Constitutional: He is oriented to person, place, and time. He appears well-developed and well-nourished. No distress.  HENT:  Head: Normocephalic.  Nose: Nose normal.  Mouth/Throat: Oropharynx is clear and moist.  Swelling in face  Eyes: Pupils are equal, round, and reactive to light. Right eye exhibits no discharge. Left eye exhibits no discharge.  Neck: Normal range of motion. Neck supple. No thyromegaly present.  Cardiovascular: Normal rate, regular rhythm, normal heart sounds and intact distal pulses.  No murmur heard. Pulmonary/Chest: Effort normal and breath sounds normal. No respiratory distress. He has no wheezes.  Abdominal: Soft. Bowel sounds are normal. He exhibits no distension. There is no tenderness.  Musculoskeletal: He exhibits no edema or tenderness.  Generalized weakness, pt in wheelchair  Neurological: He is alert and oriented to person, place, and time.  Skin: Skin is warm and dry. No rash noted. No erythema.  Psychiatric: He has a normal mood and affect. His behavior is normal. Judgment and thought content  normal.  Vitals reviewed.    BP 114/85   Pulse 86   Temp (!) 96.3 F (35.7 C) (Oral)      Assessment & Plan:  1. Muscular deconditioning - Ambulatory referral to Physical Therapy - BMP8+EGFR - CBC with Differential/Platelet  2. Hospital discharge follow-up - BMP8+EGFR - CBC with Differential/Platelet  3. Seizure ([place]) - BMP8+EGFR - CBC with Differential/Platelet  4. Meningoencephalitis - BMP8+EGFR - CBC with Differential/Platelet  Pt looks improved, Vitals stable, will do referral to Physical Therapy today for weakness Keep follow up with PCP and neurologists   Evelina Dun, FNP

## 2017-11-21 NOTE — Patient Instructions (Signed)
Bacterial Meningitis, Adult Bacterial meningitis is a serious infection that affects the membranes that line the brain and spinal cord (meninges). Bacterial meningitis needs to be treated as soon as possible. The infection can get worse quickly and cause permanent brain damage and long-term problems such as seizures and hearing loss. What are the causes? This condition is caused by bacteria. You can get this condition if fluid from an infected person's nose, mouth, or throat:  Gets into your brain through a wound in your head.  Gets into your body and travels to your brain.  What increases the risk? This condition is more likely to develop in:  People who have: ? A cerebral shunt, a cochlear implant, or a similar device. ? An infection in the head and neck area. ? A weakened immune system.  People who have traveled to [place] or to [place].  People who live close to others, such as in dormitories.  People who live with or have close contact with someone who is infected with the bacteria that cause this infection.  Health care and daycare workers.  What are the signs or symptoms? Symptoms of this condition start suddenly. They include:  High fever.  Headache.  Stiff neck.  Irritability.  Nausea and vomiting.  Decreased appetite.  Fatigue, low energy, or sleepiness (lethargy).  Trouble walking.  A change in thinking, feeling, and behaving (altered mental status).  Confusion.  Discomfort from exposure to light or loud noise.  Seizures.  Loss of consciousness.  Rapidly spreading rash. The rash consists of many small, irregular purple or red spots (petechiae) on the torso, legs, feet, mucous membranes, eyes, and sometimes on the palms of the hands or soles of the feet.  How is this diagnosed? This condition is diagnosed based on your symptoms, your medical history, a physical exam, and tests. Tests may include:  A spinal tap (lumbar puncture). This test  involves using a needle to obtain a sample of spinal fluid. This is the most important test for diagnosing this condition.  Blood tests.  Imaging studies, including a CT scan, MRI, and ultrasound. These tests can show changes in the brain that are caused by the infection.  How is this treated? This condition is usually treated at the hospital with antibiotic medicines given through an IV tube. Sometimes, steroid medicine is also given to limit swelling of the brain. Treatment usually begins as soon as your health care provider suspects bacterial meningitis. When treatment begins, you may get a combination of antibiotics to kill the bacteria that usually cause meningitis. When your health care provider gets your test results, you may be given different antibiotics that are effective against the particular bacteria that caused your condition. You may also get fluids and nutrition through an IV tube. Follow these instructions at home:  Rest.  Take over-the-counter and prescription medicines only as told by your health care provider.  Take your antibiotic as told by your health care provider. Do not stop taking the antibiotic even if you start to feel better.  Drink enough fluid to keep your urine clear or pale yellow.  Keep all follow-up visits as told by your health care provider. This is important.  Try to keep the infection from spreading to others while you are contagious, such as by: ? Staying away from others. ? Practicing good hygiene. Examples of good hygiene include washing your hands often with soap and water and covering your mouth when you cough and sneeze. ? Having people  who are in close contact with you talk with a health care provider about getting antibiotics or a vaccine to prevent the condition. How is this prevented?  If you have had contact with someone diagnosed with bacterial meningitis, let your health care provider know. You may be started on an antibiotic to prevent  bacterial meningitis.  Avoid contact with people who are sick.  Wash your hands often.  Make sure you get all routine vaccinations as scheduled. Get help right away if:  Your condition does not improve with treatment.  Your symptoms get worse.  You develop: ? A high fever. ? A stiff neck. ? Confusion. ? A rash. ? Severe vomiting or you are unable to tolerate food or fluids.  You have a headache that becomes severe or is not controlled with pain medicine.  You have a fast heartbeat.  You have a seizure.  You feel dizzy. This information is not intended to replace advice given to you by your health care provider. Make sure you discuss any questions you have with your health care provider. Document Released: 01/08/2002 Document Revised: 05/19/2016 Document Reviewed: 04/18/2016 Elsevier Interactive Patient Education  2018 [employer].

## 2017-11-22 LAB — BMP8+EGFR
BUN/Creatinine Ratio: 10 (ref 9–20)
BUN: 7 mg/dL (ref 6–20)
CHLORIDE: 94 mmol/L — AB (ref 96–106)
CO2: 28 mmol/L (ref 20–29)
Calcium: 9.9 mg/dL (ref 8.7–10.2)
Creatinine, Ser: 0.71 mg/dL — ABNORMAL LOW (ref 0.76–1.27)
GFR, EST AFRICAN AMERICAN: 155 mL/min/{1.73_m2} (ref >59)
GFR, EST NON AFRICAN AMERICAN: 134 mL/min/{1.73_m2} (ref >59)
Glucose: 98 mg/dL (ref 65–99)
Potassium: 3.9 mmol/L (ref 3.5–5.2)
Sodium: 142 mmol/L (ref 134–144)

## 2017-11-22 LAB — CBC WITH DIFFERENTIAL/PLATELET
BASOS ABS: 0 10*3/uL (ref 0.0–0.2)
BASOS: 0 %
EOS (ABSOLUTE): 0.1 10*3/uL (ref 0.0–0.4)
Eos: 1 %
HEMOGLOBIN: 11 g/dL — AB (ref 13.0–17.7)
Hematocrit: 33 % — ABNORMAL LOW (ref 37.5–51.0)
IMMATURE GRANS (ABS): 0.1 10*3/uL (ref 0.0–0.1)
IMMATURE GRANULOCYTES: 1 %
LYMPHS: 13 %
Lymphocytes Absolute: 1.3 10*3/uL (ref 0.7–3.1)
MCH: 31.5 pg (ref 26.6–33.0)
MCHC: 33.3 g/dL (ref 31.5–35.7)
MCV: 95 fL (ref 79–97)
MONOCYTES: 2 %
Monocytes Absolute: 0.2 10*3/uL (ref 0.1–0.9)
NEUTROS ABS: 8.1 10*3/uL — AB (ref 1.4–7.0)
NEUTROS PCT: 83 %
Platelets: 347 10*3/uL (ref 150–379)
RBC: 3.49 x10E6/uL — ABNORMAL LOW (ref 4.14–5.80)
RDW: 15.7 % — ABNORMAL HIGH (ref 12.3–15.4)
WBC: 9.6 10*3/uL (ref 3.4–10.8)

## 2017-11-23 ENCOUNTER — Other Ambulatory Visit: Payer: Self-pay | Admitting: Family Medicine

## 2017-11-24 NOTE — Telephone Encounter (Signed)
LAST SEEN 11/21/17

## 2017-11-26 NOTE — Telephone Encounter (Signed)
Aware. 

## 2017-12-06 ENCOUNTER — Ambulatory Visit: Payer: Medicare Other | Attending: Family | Admitting: Physical Therapy

## 2017-12-06 ENCOUNTER — Other Ambulatory Visit: Payer: Self-pay

## 2017-12-06 ENCOUNTER — Encounter: Payer: Self-pay | Admitting: Physical Therapy

## 2017-12-06 VITALS — HR 102

## 2017-12-06 DIAGNOSIS — M6281 Muscle weakness (generalized): Secondary | ICD-10-CM

## 2017-12-06 DIAGNOSIS — R2681 Unsteadiness on feet: Secondary | ICD-10-CM | POA: Diagnosis not present

## 2017-12-06 NOTE — Therapy (Signed)
[facility] [place], [place], [address] Phone: [phone]   Fax:  [phone]  Physical Therapy Evaluation  Patient Details  Name: James Hoffman MRN: [id number] Date of Birth: 06-May-1996 Referring Provider: Evelina Dun   Encounter Date: 12/06/2017  PT End of Session - 12/06/17 1741    Visit Number  1    Number of Visits  16    Date for PT Re-Evaluation  02/04/18    PT Start Time  0230    PT Stop Time  0305    PT Time Calculation (min)  35 min    Activity Tolerance  Patient tolerated treatment well    Behavior During Therapy  [facility] for tasks assessed/performed       Past Medical History:  Diagnosis Date  . Adrenal insufficiency ([place])   . Cancer ([place])    brain tumor on brain stem  . Hydrocephalus   . Osteoporosis   . Thyroid disease     Past Surgical History:  Procedure Laterality Date  . brain tumor removal  August 2012  . HIP SURGERY    . teeth removal      Vitals:   12/06/17 1734  Pulse: (!) 102  SpO2: 97%     Subjective Assessment - 12/06/17 1715    Subjective  The patient returns to physical therapy reportedly having to stop PT early last time due to seizures.  He states he is feeling much better now. He hopes he can walk again one day by himself.     Limitations  Walking    Patient Stated Goals  Get stronger and walk better.    Currently in Pain?  No/denies         [facility] PT Assessment - 12/06/17 0001      Assessment   Medical Diagnosis  Muscular deconditioning.    Referring Provider  Evelina Dun    Onset Date/Surgical Date  -- 7+ years.      Precautions   Precautions  -- HALL FALL RISK.  Monitor 02 sat and HR please.      Restrictions   Weight Bearing Restrictions  No      Balance Screen   Has the patient fallen in the past 6 months  No    Has the patient had a decrease in activity level because of a fear of falling?   Yes    Is the patient reluctant to leave their home  because of a fear of falling?   No      Prior Function   Level of Independence  -- Pt. uses HHA around his home for ambulation.      Posture/Postural Control   Postural Limitations  Rounded Shoulders;Forward head;Decreased lumbar lordosis;Increased thoracic kyphosis      AROM   Overall AROM Comments  Left heel cord remains tight but bilateral hip and knee ROM is WNL.      Strength   Overall Strength Comments  Right hip flexion and abduction= 4-/5; right knee ext= 4/5 and right knee flexion= 4+/5 and bilateral dorsiflexion= 3-/5; Left hip flexion and abduction= 4-/5; left knee ext= 4/5 and left knee flexion= 4+/5.      Special Tests    Special Tests  -- (+) Romberg test.      Transfers   Sit to Stand  -- Supervision with armrests.    Comments  Supine to sit with supervision.      Ambulation/Gait   Ambulation Distance (Feet)  -- 50  feet x 2.    Gait Comments  FWW used with gait belt with min- assist.  patient wearing high top boots which stabilizes his ankle.  His gait remains ataxic but he did not scissor today.  His step and stride length are decreased as is his cadence.             Objective measurements completed on examination: See above findings.                PT Short Term Goals - 06/25/17 1712      PT SHORT TERM GOAL #1   Title  STG's=LTG's.        PT Long Term Goals - 07/05/17 1609      PT LONG TERM GOAL #1   Title  Ind with an advanced HEP.    Time  8    Period  Weeks    Status  On-going      PT LONG TERM GOAL #2   Title  Increase bilateral LE strength to a solid 4+/5.    Time  8    Period  Weeks    Status  On-going      PT LONG TERM GOAL #3   Title  Sit to stand x 5 with SBA.    Time  8    Period  Weeks    Status  Achieved      PT LONG TERM GOAL #4   Title  Walk in clinic with CGA/SBA with a cane 500 feet.    Time  8    Period  Weeks    Status  On-going             Plan - 12/06/17 1738    Clinical Impression  Statement  The patient returns to PT today.  His gait remains ataxic and his has bilateral LE global weakness.  He had a very bright affect today and was motivated for PT.  He is at high risk for falls and his balance is very poor.  The patient will benefit from skilled physical therapy intervention to address deficits that impair his functional mobility.    Clinical Presentation  Stable    Clinical Decision Making  Moderate    Rehab Potential  Good    PT Frequency  2x / week    PT Duration  8 weeks    PT Treatment/Interventions  ADLs/Self Care Home Management;Functional mobility training;Stair training;Gait training;Therapeutic activities;Therapeutic exercise;Balance training;Neuromuscular re-education;Patient/family education    PT Next Visit Plan  gait/balance activites and bilateral LE strengthning exercises.    Consulted and Agree with Plan of Care  Patient       Patient will benefit from skilled therapeutic intervention in order to improve the following deficits and impairments:  Abnormal gait, Decreased activity tolerance, Decreased strength, Decreased coordination, Decreased balance, Pain  Visit Diagnosis: Unsteadiness on feet - Plan: PT plan of care cert/re-cert  Muscle weakness (generalized) - Plan: PT plan of care cert/re-cert  Unsteadiness - Plan: PT plan of care cert/re-cert     Problem List Patient Active Problem List   Diagnosis Date Noted  . Gait instability 12/19/2016  . Hypogonadism, male 07/19/2016  . Normocytic anemia 04/14/2016  . Muscular deconditioning 09/17/2015  . Other specified hypothyroidism 09/09/2015  . Medullary carcinoma ([place]) 09/09/2015  . Adrenal insufficiency ([place]) 09/09/2015  . Hypotension 09/09/2015  . Hyponatremia 09/09/2015  . Hypokalemia 09/09/2015    [place], [place] MPT 12/06/2017, 5:43 PM  [facility] [address]  [place], [place], [address] Phone: [phone]   Fax:   [phone]  Name: YOSTIN MALACARA MRN: [id number] Date of Birth: 04-01-1996

## 2017-12-11 ENCOUNTER — Ambulatory Visit (INDEPENDENT_AMBULATORY_CARE_PROVIDER_SITE_OTHER): Payer: Medicare Other | Admitting: "Endocrinology

## 2017-12-11 ENCOUNTER — Encounter: Payer: Self-pay | Admitting: "Endocrinology

## 2017-12-11 VITALS — BP 118/76 | HR 76

## 2017-12-11 DIAGNOSIS — E038 Other specified hypothyroidism: Secondary | ICD-10-CM | POA: Diagnosis not present

## 2017-12-11 DIAGNOSIS — E291 Testicular hypofunction: Secondary | ICD-10-CM | POA: Diagnosis not present

## 2017-12-11 DIAGNOSIS — E2740 Unspecified adrenocortical insufficiency: Secondary | ICD-10-CM | POA: Diagnosis not present

## 2017-12-11 MED ORDER — TESTOSTERONE 20.25 MG/ACT (1.62%) TD GEL: TRANSDERMAL | 2 refills | Status: DC

## 2017-12-11 NOTE — Progress Notes (Signed)
Subjective:    Patient ID: James Hoffman, male    DOB: 1996-02-03, PCP Timmothy Euler, MD    Past Medical History:  Diagnosis Date  . Adrenal insufficiency ([place])   . 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 Hydrocephalus   . Osteoporosis   . Thyroid disease    Past Surgical History:  Procedure Laterality Date  . brain tumor removal  August 2012  . HIP SURGERY    . teeth removal     Social History   Socioeconomic History  . Marital status: Single    Spouse name: None  . Number of children: None  . Years of education: None  . Highest education level: None  Social Needs  . Financial resource strain: None  . Food insecurity - worry: None  . Food insecurity - inability: None  . Transportation needs - medical: None  . Transportation needs - non-medical: None  Occupational History  . None  Tobacco Use  . Smoking status: Current Every Day Smoker    Packs/day: 0.25    Types: Cigarettes    Start date: 11/25/2012  . Smokeless tobacco: Never Used  Substance and Sexual Activity  . Alcohol use: No  . Drug use: No  . Sexual activity: None  Other Topics Concern  . None  Social History Narrative  . None   Outpatient Encounter Medications as of 12/11/2017  Medication Sig  . levothyroxine (SYNTHROID, LEVOTHROID) 88 MCG tablet TAKE 1 TABLET (88 MCG TOTAL) BY MOUTH DAILY BEFORE BREAKFAST.  Marland Kitchen predniSONE (DELTASONE) 10 MG tablet TAKE 1 TABLET (10 MG TOTAL) BY MOUTH DAILY WITH BREAKFAST.  Marland Kitchen Testosterone (ANDROGEL PUMP) 20.25 MG/ACT (1.62%) GEL One pump on each shoulder every morning  . [DISCONTINUED] Testosterone (ANDROGEL PUMP) 20.25 MG/ACT (1.62%) GEL One pump on each shoulder every morning   No facility-administered encounter medications on file as of 12/11/2017.    ALLERGIES: No Known Allergies VACCINATION STATUS:  There is no immunization history on file for this patient.  HPI  22 year old male patient with medical history as above. He is being seen in  follow-up for history of adrenal insufficiency , hypogonadism, and hypothyroidism requested by Dr. Wendi Snipes. - he missed his appointments since December 2017. He ran out of his AndroGel. He did have interval hospitalizations for altered mental status with hypoxia and hypotension. - Circumstances of his care prior to hospitalization are not available today. Patient is back in the care of his mother, who accompanies him to clinic today. She claims that he never ran out of his prednisone nor levothyroxine.  -He has a long and complicated medical history. 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  -His surgery and treatment for medulloblastoma took place at [facility] in [place]. On subsequent years he was diagnosed with hypothyroidism, hypogonadism, and adrenal insufficiency. He is currently on prednisone 10 mg by mouth daily and levothyroxine 88 g by mouth every morning. - He continues to have ataxia , following up with outpatient physical therapy. He has significant disequilibrium and on and off dizziness and lightheadedness.  He has both motor and sensory aphasia.  -Reportedly, he never grew any mustache nor beard and never had to shave. -He has never engaged in sexual activity per family's history.  - He was supposed to be on AndroGel 20.25 mg pump once every morning for profound secondary hypogonadism. However he ran out of this product several months, his previsit  labs show total testosterone which is undetectable.  Review of Systems Constitutional:  Has always been light build, recently gained 20 pounds, and has steady weight over the last several months. - he says he feels better since discharge from hospital, no subjective hyperthermia/hypothermia - He is wheelchair-bound. Eyes: no blurry vision, no xerophthalmia ENT: no sore throat, no nodules palpated in throat, no dysphagia/odynophagia, no hoarseness Cardiovascular:  no CP/SOB/palpitations/leg swelling Respiratory: no cough/SOB Gastrointestinal: no N/V/D/C Musculoskeletal: no muscle/joint aches Skin: no rashes Neurological: no tremors, + complains of numbness of left upper extremity associated with tingling. Marland Kitchen He has on and off lightheadedness and dizziness. Psychiatric: no depression/anxiety  Objective:    BP 118/76   Pulse 76   Wt Readings from Last 3 Encounters:  10/28/17 144 lb (65.3 kg)  10/13/17 144 lb (65.3 kg)  08/13/17 144 lb (65.3 kg)    Physical Exam  Constitutional: chronically sick-looking in NAD, he is in  wheelchair today. Eyes: PERRLA, EOMI, no exophthalmos ENT: + edentulous, healed post craniotomy scar on the back of his head, moist mucous membranes, no thyromegaly, no cervical lymphadenopathy Cardiovascular: RRR, No MRG Respiratory: CTA B Gastrointestinal: abdomen soft, NT, ND, BS+ Musculoskeletal: Global loss of skeletal muscles, no deformities, strength 3 out of 5 in all 4 extremities. Skin: moist, warm, no rashes, has poor skin turgor , no mustache no beard. He wanted to defer genital exam. Neurological: no tremor with outstretched hands, DTR normal in all 4   CMP     Component Value Date/Time   NA 142 11/21/2017 1545   K 3.9 11/21/2017 1545   CL 94 (L) 11/21/2017 1545   CO2 28 11/21/2017 1545   GLUCOSE 98 11/21/2017 1545   GLUCOSE 96 09/10/2015 0533   BUN 7 11/21/2017 1545   CREATININE 0.71 (L) 11/21/2017 1545   CALCIUM 9.9 11/21/2017 1545   PROT 6.3 09/20/2017 0000   ALBUMIN 4.2 09/20/2017 0000   AST 11 09/20/2017 0000   ALT 14 09/20/2017 0000   ALKPHOS 98 09/20/2017 0000   BILITOT <0.2 09/20/2017 0000   GFRNONAA 134 11/21/2017 1545   GFRAA 155 11/21/2017 1545     Assessment & Plan:   1. Adrenal insufficiency ([place]) - He seems to have well settled diagnosis of secondary adrenal insufficiency likely as a result of surgery and radiation to his head associated with his history of medulloblastoma of the  brainstem. The details of his cancer therapy are not available to review today. -He did better with increased steroid support. I will  continue  his prednisone  10 mg by mouth every morning. I advised him on sick day rules where he can double his prednisone until the stressors are resolved. - his mother reports that he is having regular bone density his rheumatologists office.  2. Other specified hypothyroidism  - His thyroid function tests are within target. I discussed the need for levothyroxine replacement for life with him. I will continue current dose of levothyroxine at 88 g by mouth every morning.   - We discussed about correct intake of levothyroxine, at fasting, with water, separated by at least 30 minutes from breakfast, and separated by more than 4 hours from calcium, iron, multivitamins, acid reflux medications (PPIs). -Patient is made aware of the fact that thyroid hormone replacement is needed for life, dose to be adjusted by periodic monitoring of thyroid function tests.  -I have extensively counseled him against smoking.  -He will need for medical alert device to wear on his body  depicting his history is specially of the need for steroids.  3. Profound hypogonadism:  His total testosterone is <3, due to treatment withdrawal.  - He will benefit from continuation of  testosterone replacement, this will hopefully help with the muscular deconditioning he is dealing with as well. -I discussed his options. Due to his poor social support, he would not be able to do injectable testosterone.  I refilled his AndroGel 20.25mg  pump   on both shoulders to get a total of 40.5 mg testosterone  daily in the morning. He will likely require higher doses however this is a safe dose to start with.  - I have instructed his  family to document how much liquids he drinks and how many times he visits the bathroom. This is a patient who is  at-risk of diabetes insipidus. - I advised patient to maintain  close follow up with Timmothy Euler, MD for primary care needs. Follow up plan: Return in about 3 months (around 03/11/2018) for follow up with pre-visit labs. - Time spent with the patient: 25 min, of which >50% was spent in reviewing her  current and  previous labs, previous treatments, and medications  doses and developing a plan for long-term care.   Glade Lloyd, MD Phone: [phone]  Fax: [phone]  -  This note was partially dictated with voice recognition software. Similar sounding words can be transcribed inadequately or may not  be corrected upon review.  12/11/2017, 3:06 PM

## 2017-12-13 ENCOUNTER — Encounter: Payer: Self-pay | Admitting: Physical Therapy

## 2017-12-13 ENCOUNTER — Ambulatory Visit: Payer: Medicare Other | Admitting: Physical Therapy

## 2017-12-13 DIAGNOSIS — M6281 Muscle weakness (generalized): Secondary | ICD-10-CM | POA: Diagnosis not present

## 2017-12-13 DIAGNOSIS — R2681 Unsteadiness on feet: Secondary | ICD-10-CM | POA: Diagnosis not present

## 2017-12-13 NOTE — Therapy (Signed)
[facility] [place], [place], [address] Phone: [phone]   Fax:  [phone]  Physical Therapy Treatment  Patient Details  Name: James Hoffman MRN: [id number] Date of Birth: [birth year] Referring Provider: Evelina Dun   Encounter Date: 12/13/2017  PT End of Session - 12/13/17 1526    Visit Number  2    Number of Visits  16    Date for PT Re-Evaluation  02/04/18    PT Start Time  2979    PT Stop Time  1556    PT Time Calculation (min)  41 min    Activity Tolerance  Patient tolerated treatment well    Behavior During Therapy  [facility] for tasks assessed/performed       Past Medical History:  Diagnosis Date  . Adrenal insufficiency ([place])   . 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 Hydrocephalus   . Osteoporosis   . Thyroid disease     Past Surgical History:  Procedure Laterality Date  . brain tumor removal  August 2012  . HIP SURGERY    . teeth removal      There were no vitals filed for this visit.  Subjective Assessment - 12/13/17 1521    Subjective  Patient reported doing well after last treatment. Patient reported discomfort in left knee and felt pop for unknown reason "the other day" no pain or complaints upon arrival    Limitations  Walking    Patient Stated Goals  Get stronger and walk better.    Currently in Pain?  No/denies                      [facility] Adult PT Treatment/Exercise - 12/13/17 0001      Transfers   Transfers  Sit to Stand;Stand Pivot Transfers    Comments  patient able to transfer and sit to stand with supervision and SBA      Ambulation/Gait   Ambulation/Gait  Yes    Ambulation Distance (Feet)  75 Feet    Assistive device  1 person hand held assist    Gait Pattern  Ataxic    Ambulation Surface  Level;Indoor    Gait Comments  patient required cues for pace      Knee/Hip Exercises: Aerobic   Nustep  32min L5 UE/LE activity, monitored      Knee/Hip  Exercises: Seated   Long Arc Quad  Strengthening;Both;Weights;Limitations    Long Arc Quad Weight  4 lbs.    Long [employer] Limitations  3x10 half range due to knee discomfort in left side    Marching  Strengthening;Both;2 sets;10 reps;Weights    Marching Weights  4 lbs.    Hamstring Curl  Strengthening;Both;20 reps;Limitations    Hamstring Limitations  green t-band      Knee/Hip Exercises: Supine   Hip Adduction Isometric  Strengthening;Both;Limitations;20 reps    Hip Adduction Isometric Limitations  with grey ball    Bridges  Strengthening;Both;2 sets;10 reps    Straight Leg Raises  Strengthening;Both;2 sets;10 reps    Other Supine Knee/Hip Exercises  hip abd with green t-band  2x10               PT Short Term Goals - 06/25/17 1712      PT SHORT TERM GOAL #1   Title  STG's=LTG's.        PT Long Term Goals - 07/05/17 1609      PT LONG  TERM GOAL #1   Title  Ind with an advanced HEP.    Time  8    Period  Weeks    Status  On-going      PT LONG TERM GOAL #2   Title  Increase bilateral LE strength to a solid 4+/5.    Time  8    Period  Weeks    Status  On-going      PT LONG TERM GOAL #3   Title  Sit to stand x 5 with SBA.    Time  8    Period  Weeks    Status  Achieved      PT LONG TERM GOAL #4   Title  Walk in clinic with CGA/SBA with a cane 500 feet.    Time  8    Period  Weeks    Status  On-going            Plan - 12/13/17 1555    Clinical Impression Statement  Patient tolerated treatment well today. Patient required ongoing verbal education and cues for technique and pace. Patient able to complete all exercises today. Patient reported some left knee discomfort for unknown reason. Current goals ongoing due to limitations.     Rehab Potential  Good    PT Frequency  2x / week    PT Duration  8 weeks    PT Treatment/Interventions  ADLs/Self Care Home Management;Functional mobility training;Stair training;Gait training;Therapeutic  activities;Therapeutic exercise;Balance training;Neuromuscular re-education;Patient/family education    PT Next Visit Plan  cont with POC for gait/balance activites and bilateral LE strengthning exercises.    Consulted and Agree with Plan of Care  Patient       Patient will benefit from skilled therapeutic intervention in order to improve the following deficits and impairments:  Abnormal gait, Decreased activity tolerance, Decreased strength, Decreased coordination, Decreased balance, Pain  Visit Diagnosis: Unsteadiness on feet  Muscle weakness (generalized)  Unsteadiness     Problem List Patient Active Problem List   Diagnosis Date Noted  . Gait instability 12/19/2016  . Hypogonadism, male 07/19/2016  . Normocytic anemia 04/14/2016  . Muscular deconditioning 09/17/2015  . Other specified hypothyroidism 09/09/2015  . Medullary carcinoma ([place]) 09/09/2015  . Adrenal insufficiency ([place]) 09/09/2015  . Hypotension 09/09/2015  . Hyponatremia 09/09/2015  . Hypokalemia 09/09/2015    Laurianne Floresca P, PTA 12/13/2017, 3:57 PM  [facility] [place], [place], [address] Phone: [phone]   Fax:  [phone]  Name: James Hoffman MRN: [id number] Date of Birth: 1996-04-15

## 2017-12-18 ENCOUNTER — Encounter: Payer: Medicare Other | Admitting: *Deleted

## 2017-12-20 ENCOUNTER — Other Ambulatory Visit: Payer: Self-pay | Admitting: "Endocrinology

## 2017-12-20 ENCOUNTER — Other Ambulatory Visit: Payer: Self-pay | Admitting: Family Medicine

## 2017-12-20 ENCOUNTER — Encounter: Payer: Medicare Other | Admitting: Physical Therapy

## 2017-12-20 DIAGNOSIS — E038 Other specified hypothyroidism: Secondary | ICD-10-CM

## 2017-12-22 ENCOUNTER — Other Ambulatory Visit: Payer: Self-pay | Admitting: Family Medicine

## 2017-12-22 DIAGNOSIS — E038 Other specified hypothyroidism: Secondary | ICD-10-CM

## 2017-12-22 MED ORDER — LEVOTHYROXINE SODIUM 88 MCG PO TABS: 88.0000 ug | ORAL_TABLET | Freq: Every day | ORAL | 0 refills | Status: DC

## 2017-12-22 NOTE — Telephone Encounter (Signed)
What is the name of the medication? Levothyroxine 88 mcg -- Patient seen Dr. Dorris Fetch two weeks ago for his thyroid and he hadn't called his medications in. Been out for two days. There is a refill request that was sent into Dr. Dorris Fetch 2-7 but hadn't been filled  Have you contacted your pharmacy to request a refill? YES  Which pharmacy would you like this sent to? [facility] in [place]   Patient notified that their request is being sent to the clinical staff for review and that they should receive a call once it is complete. If they do not receive a call within 24 hours they can check with their pharmacy or our office.

## 2017-12-22 NOTE — Telephone Encounter (Signed)
Per Dr. Evette Doffing - okay to send in one month supply. Mom aware.

## 2017-12-24 ENCOUNTER — Other Ambulatory Visit: Payer: Self-pay | Admitting: "Endocrinology

## 2017-12-25 ENCOUNTER — Encounter: Payer: Medicare Other | Admitting: *Deleted

## 2017-12-27 ENCOUNTER — Encounter: Payer: Medicare Other | Admitting: *Deleted

## 2018-01-01 ENCOUNTER — Ambulatory Visit: Payer: Medicare Other | Attending: Family | Admitting: *Deleted

## 2018-01-01 ENCOUNTER — Encounter: Payer: Self-pay | Admitting: *Deleted

## 2018-01-01 DIAGNOSIS — R2681 Unsteadiness on feet: Secondary | ICD-10-CM | POA: Diagnosis not present

## 2018-01-01 DIAGNOSIS — R29898 Other symptoms and signs involving the musculoskeletal system: Secondary | ICD-10-CM | POA: Insufficient documentation

## 2018-01-01 DIAGNOSIS — R260 Ataxic gait: Secondary | ICD-10-CM | POA: Insufficient documentation

## 2018-01-01 DIAGNOSIS — M6281 Muscle weakness (generalized): Secondary | ICD-10-CM | POA: Diagnosis not present

## 2018-01-01 DIAGNOSIS — R269 Unspecified abnormalities of gait and mobility: Secondary | ICD-10-CM

## 2018-01-01 NOTE — Therapy (Signed)
[facility] [place], [place], [address] Phone: [phone]   Fax:  [phone]  Physical Therapy Treatment  Patient Details  Name: James Hoffman MRN: [id number] Date of Birth: 14-Jan-1996 Referring Provider: Evelina Dun   Encounter Date: 01/01/2018  PT End of Session - 01/01/18 1541    Visit Number  3    Number of Visits  16    Date for PT Re-Evaluation  02/04/18    PT Start Time  6834    PT Stop Time  1607    PT Time Calculation (min)  46 min       Past Medical History:  Diagnosis Date  . Adrenal insufficiency ([place])   . 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 Hydrocephalus   . Osteoporosis   . Thyroid disease     Past Surgical History:  Procedure Laterality Date  . brain tumor removal  August 2012  . HIP SURGERY    . teeth removal      There were no vitals filed for this visit.  Subjective Assessment - 01/01/18 1539    Subjective  I have been sick. Still not feeling good. Ifell at home right before coming and my LT knee is a little sore.    Limitations  Walking    Patient Stated Goals  Get stronger and walk better.    Currently in Pain?  No/denies                      [facility] Adult PT Treatment/Exercise - 01/01/18 0001      Transfers   Transfers  Sit to Stand;Stand Pivot Transfers    Comments  patient able to transfer and sit to stand with supervision and SBA      Ambulation/Gait   Ambulation Distance (Feet)  50 Feet    Assistive device  1 person hand held assist    Gait Pattern  Ataxic    Ambulation Surface  Level;Indoor      Posture/Postural Control   Postural Limitations  Rounded Shoulders;Forward head;Decreased lumbar lordosis;Increased thoracic kyphosis    Posture Comments  UBE x 6 min for posture focus      Knee/Hip Exercises: Aerobic   Nustep  31min L5 UE/LE activity, monitored      Knee/Hip Exercises: Seated   Long Arc Quad   Strengthening;Both;Weights;Limitations;3 sets;10 reps    Long Arc Quad Weight  4 lbs.    Marching  Strengthening;Both;2 sets;10 reps;Weights    Marching Weights  4 lbs.    Hamstring Curl  --    Hamstring Limitations  --      Knee/Hip Exercises: Supine   Hip Adduction Isometric  -- seated  abd with blue tband 3x10                         Plan - 01/01/18 1543    Clinical Impression Statement  Pt arrived today doing fair after being sick  last week, but still very weak. He was able to complete all therex at a slower pace. He was able to ambulate with HHA, but very ataxic today.    Clinical Presentation  Stable    Rehab Potential  Good    PT Frequency  2x / week    PT Duration  8 weeks    PT Treatment/Interventions  ADLs/Self Care Home Management;Functional mobility training;Stair training;Gait training;Therapeutic activities;Therapeutic exercise;Balance training;Neuromuscular re-education;Patient/family education  PT Next Visit Plan  cont with POC for gait/balance activites and bilateral LE strengthning exercises.       Patient will benefit from skilled therapeutic intervention in order to improve the following deficits and impairments:  Abnormal gait, Decreased activity tolerance, Decreased strength, Decreased coordination, Decreased balance, Pain  Visit Diagnosis: Unsteadiness on feet  Muscle weakness (generalized)  Unsteadiness  Ataxic gait  Abnormality of gait  Weakness of both lower extremities     Problem List Patient Active Problem List   Diagnosis Date Noted  . Gait instability 12/19/2016  . Hypogonadism, male 07/19/2016  . Normocytic anemia 04/14/2016  . Muscular deconditioning 09/17/2015  . Other specified hypothyroidism 09/09/2015  . Medullary carcinoma ([place]) 09/09/2015  . Adrenal insufficiency ([place]) 09/09/2015  . Hypotension 09/09/2015  . Hyponatremia 09/09/2015  . Hypokalemia 09/09/2015    Olof Marcil,[name] , PTA 01/01/2018, 5:17  PM  [facility] [place], [place], [address] Phone: [phone]   Fax:  [phone]  Name: DARTANION TEO MRN: [id number] Date of Birth: 17-Sep-1996

## 2018-01-03 ENCOUNTER — Encounter: Payer: Medicare Other | Admitting: *Deleted

## 2018-01-08 ENCOUNTER — Encounter: Payer: Medicare Other | Admitting: *Deleted

## 2018-01-10 ENCOUNTER — Ambulatory Visit: Payer: Medicare Other | Admitting: *Deleted

## 2018-01-10 ENCOUNTER — Encounter: Payer: Self-pay | Admitting: *Deleted

## 2018-01-10 DIAGNOSIS — R269 Unspecified abnormalities of gait and mobility: Secondary | ICD-10-CM

## 2018-01-10 DIAGNOSIS — M6281 Muscle weakness (generalized): Secondary | ICD-10-CM

## 2018-01-10 DIAGNOSIS — R260 Ataxic gait: Secondary | ICD-10-CM

## 2018-01-10 DIAGNOSIS — R2681 Unsteadiness on feet: Secondary | ICD-10-CM

## 2018-01-10 DIAGNOSIS — R29898 Other symptoms and signs involving the musculoskeletal system: Secondary | ICD-10-CM | POA: Diagnosis not present

## 2018-01-10 NOTE — Therapy (Signed)
[facility] [place], [place], [address] Phone: [phone]   Fax:  [phone]  Physical Therapy Treatment  Patient Details  Name: DVID PENDRY MRN: [id number] Date of Birth: March 11, 1996 Referring Provider: Evelina Dun   Encounter Date: 01/10/2018  PT End of Session - 01/10/18 1711    Visit Number  4    Number of Visits  16    Date for PT Re-Evaluation  02/04/18    PT Start Time  8182    PT Stop Time  1605    PT Time Calculation (min)  50 min       Past Medical History:  Diagnosis Date  . Adrenal insufficiency ([place])   . 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 Hydrocephalus   . Osteoporosis   . Thyroid disease     Past Surgical History:  Procedure Laterality Date  . brain tumor removal  August 2012  . HIP SURGERY    . teeth removal      There were no vitals filed for this visit.  Subjective Assessment - 01/10/18 1532    Subjective  I have been sick. Still not feeling good. Ifell at home right before coming and my LT knee is a little sore.    Limitations  Walking    Patient Stated Goals  Get stronger and walk better.    Currently in Pain?  No/denies                      [facility] Adult PT Treatment/Exercise - 01/10/18 0001      Transfers   Transfers  Sit to Stand;Stand Pivot Transfers    Comments  patient able to transfer and sit to stand with supervision and SBA      Ambulation/Gait   Ambulation/Gait  Yes    Ambulation Distance (Feet)  65 Feet    Assistive device  1 person hand held assist;Small based quad cane CGA with min/mod assist    Gait Pattern  Ataxic    Ambulation Surface  Level;Indoor    Gait Comments  patient required cues for placement of SBQC due to advancing it to far and LOB      Posture/Postural Control   Postural Limitations  Rounded Shoulders;Forward head;Decreased lumbar lordosis;Increased thoracic kyphosis    Posture Comments  UBE x 6 min for posture  focus VC for posture      Knee/Hip Exercises: Aerobic   Nustep  72min L5 UE/LE activity, monitored      Knee/Hip Exercises: Seated   Long Arc Quad  Strengthening;Both;Weights;Limitations;10 reps;2 sets    [employer] Weight  4 lbs.    Marching  Strengthening;Both;2 sets;10 reps;Weights    Marching Weights  4 lbs.      Knee/Hip Exercises: Supine   Hip Adduction Isometric  Strengthening;Both;Limitations;20 reps    Hip Adduction Isometric Limitations  with grey ball    Bridges  Strengthening;Both;2 sets;10 reps                  PT Long Term Goals - 01/10/18 1722      PT LONG TERM GOAL #1   Title  Ind with an advanced HEP.    Baseline  No knowledge of appropriate ther ex.    Time  8    Period  Weeks    Status  On-going      PT LONG TERM GOAL #2   Title  Increase bilateral LE  strength to a solid 4+/5.    Baseline  Bilateral LE strength grades from 3- to 4-/5.    Time  8    Period  Weeks    Status  On-going      PT LONG TERM GOAL #3   Title  Sit to stand x 5 with SBA.    Baseline  Up to min assist required today.    Time  8    Period  Weeks    Status  On-going      PT LONG TERM GOAL #4   Title  Walk in clinic with CGA/SBA with a cane 500 feet.    Baseline  Patient requires HHA to ambulate safely.    Time  8    Period  Weeks    Status  On-going      PT LONG TERM GOAL #5   Title  Improve Berg score by 10-12 points.    Time  8    Period  Weeks    Status  On-going            Plan - 01/10/18 1714    Clinical Impression Statement  Pt arrived today feeling a little fatigued. Ambulation with [facility] was performed in clinic with CGA/min assist to help maintain balance PRN. Pt also needed V/Cs not to place the [facility] so far ahead of himdue to LOB. Ataxic movements and LOB x3 during gait and assistance needed for correction. He was also able to complete Therex for LE strengthening and core/posture focus. LTGs are ongoing    Clinical Decision Making  Moderate     Rehab Potential  Good    PT Frequency  2x / week    PT Duration  8 weeks    PT Treatment/Interventions  ADLs/Self Care Home Management;Functional mobility training;Stair training;Gait training;Therapeutic activities;Therapeutic exercise;Balance training;Neuromuscular re-education;Patient/family education    PT Next Visit Plan  cont with POC for gait/balance activites and bilateral LE strengthning exercises.    Consulted and Agree with Plan of Care  Patient       Patient will benefit from skilled therapeutic intervention in order to improve the following deficits and impairments:  Abnormal gait, Decreased activity tolerance, Decreased strength, Decreased coordination, Decreased balance, Pain  Visit Diagnosis: Unsteadiness on feet  Muscle weakness (generalized)  Unsteadiness  Ataxic gait  Abnormality of gait  Weakness of both lower extremities     Problem List Patient Active Problem List   Diagnosis Date Noted  . Gait instability 12/19/2016  . Hypogonadism, male 07/19/2016  . Normocytic anemia 04/14/2016  . Muscular deconditioning 09/17/2015  . Other specified hypothyroidism 09/09/2015  . Medullary carcinoma ([place]) 09/09/2015  . Adrenal insufficiency ([place]) 09/09/2015  . Hypotension 09/09/2015  . Hyponatremia 09/09/2015  . Hypokalemia 09/09/2015    RAMSEUR,[name], PTA 01/10/2018, 5:24 PM  [facility] [address] [place], [place], [address] Phone: [phone]   Fax:  [phone]  Name: PRYOR GUETTLER MRN: [id number] Date of Birth: Feb 27, 1996

## 2018-01-15 ENCOUNTER — Encounter: Payer: Medicare Other | Admitting: *Deleted

## 2018-01-17 ENCOUNTER — Ambulatory Visit: Payer: Medicare Other | Attending: Family | Admitting: *Deleted

## 2018-01-17 DIAGNOSIS — M6281 Muscle weakness (generalized): Secondary | ICD-10-CM

## 2018-01-17 DIAGNOSIS — R29898 Other symptoms and signs involving the musculoskeletal system: Secondary | ICD-10-CM

## 2018-01-17 DIAGNOSIS — R269 Unspecified abnormalities of gait and mobility: Secondary | ICD-10-CM | POA: Diagnosis not present

## 2018-01-17 DIAGNOSIS — R2681 Unsteadiness on feet: Secondary | ICD-10-CM | POA: Diagnosis not present

## 2018-01-17 DIAGNOSIS — R260 Ataxic gait: Secondary | ICD-10-CM | POA: Diagnosis not present

## 2018-01-17 NOTE — Therapy (Signed)
[facility] [place], [place], [address] Phone: [phone]   Fax:  [phone]  Physical Therapy Treatment  Patient Details  Name: James Hoffman MRN: [id number] Date of Birth: [birth year] Referring Provider: Evelina Dun   Encounter Date: 01/17/2018  PT End of Session - 01/17/18 1527    Visit Number  5    Number of Visits  16    Date for PT Re-Evaluation  02/04/18    PT Start Time  5400    PT Stop Time  8676    PT Time Calculation (min)  49 min       Past Medical History:  Diagnosis Date  . Adrenal insufficiency ([place])   . 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 Hydrocephalus   . Osteoporosis   . Thyroid disease     Past Surgical History:  Procedure Laterality Date  . brain tumor removal  August 2012  . HIP SURGERY    . teeth removal      There were no vitals filed for this visit.                   [facility] Adult PT Treatment/Exercise - 01/17/18 0001      Transfers   Transfers  Sit to [employer] Transfers    Comments  patient able to transfer and sit to stand with supervision and SBA      Ambulation/Gait   Ambulation/Gait  Yes    Ambulation Distance (Feet)  50 Feet    Assistive device  1 person hand held assist;Small based quad cane    Gait Pattern  Ataxic    Ambulation Surface  Level;Indoor    Gait Comments  patient required cues for placement of SBQC due to advancing it to far and LOB      Posture/Postural Control   Postural Limitations  Rounded Shoulders;Forward head;Decreased lumbar lordosis;Increased thoracic kyphosis    Posture Comments  UBE x 6 min for posture focus VC for posture      Exercises   Exercises  Knee/Hip;Lumbar      Knee/Hip Exercises: Aerobic   Nustep  45min L5 UE/LE activity, monitored      Knee/Hip Exercises: Machines for Strengthening   Cybex Knee Extension  20#x 10,10# 2x10    Cybex Leg Press  1pl 3x10 with focus on jt alignment and control  of LT knee to prevent genu-recurvatum      Knee/Hip Exercises: Supine   Bridges  Strengthening;Both;2 sets;10 reps    Bridges with Diona Foley Squeeze  2 sets;Both;10 reps    Bridges with Cl[employer]  Strengthening;2 sets;10 reps green                  PT Long Term Goals - 01/10/18 1722      PT LONG TERM GOAL #1   Title  Ind with an advanced HEP.    Baseline  No knowledge of appropriate ther ex.    Time  8    Period  Weeks    Status  On-going      PT LONG TERM GOAL #2   Title  Increase bilateral LE strength to a solid 4+/5.    Baseline  Bilateral LE strength grades from 3- to 4-/5.    Time  8    Period  Weeks    Status  On-going      PT LONG TERM GOAL #3   Title  Sit to stand x 5  with SBA.    Baseline  Up to min assist required today.    Time  8    Period  Weeks    Status  On-going      PT LONG TERM GOAL #4   Title  Walk in clinic with CGA/SBA with a cane 500 feet.    Baseline  Patient requires HHA to ambulate safely.    Time  8    Period  Weeks    Status  On-going      PT LONG TERM GOAL #5   Title  Improve Berg score by 10-12 points.    Time  8    Period  Weeks    Status  On-going            Plan - 01/17/18 1528    Clinical Impression Statement  Pt arrived today with moderate energy as per pt. Rx focused on gait, LE strengthening , and postural strengthening. He ambulated in clinic today with HHA and does much better than with [facility]. due to balance deficits.Marland KitchenLTGs ongoing atthis time    Rehab Potential  Good    PT Frequency  2x / week    PT Duration  8 weeks    PT Treatment/Interventions  ADLs/Self Care Home Management;Functional mobility training;Stair training;Gait training;Therapeutic activities;Therapeutic exercise;Balance training;Neuromuscular re-education;Patient/family education    PT Next Visit Plan  cont with POC for gait/balance activites and bilateral LE strengthning exercises.    Consulted and Agree with Plan of Care  Patient       Patient  will benefit from skilled therapeutic intervention in order to improve the following deficits and impairments:  Abnormal gait, Decreased activity tolerance, Decreased strength, Decreased coordination, Decreased balance, Pain  Visit Diagnosis: Unsteadiness on feet  Muscle weakness (generalized)  Unsteadiness  Ataxic gait  Abnormality of gait  Weakness of both lower extremities     Problem List Patient Active Problem List   Diagnosis Date Noted  . Gait instability 12/19/2016  . Hypogonadism, male 07/19/2016  . Normocytic anemia 04/14/2016  . Muscular deconditioning 09/17/2015  . Other specified hypothyroidism 09/09/2015  . Medullary carcinoma ([place]) 09/09/2015  . Adrenal insufficiency ([place]) 09/09/2015  . Hypotension 09/09/2015  . Hyponatremia 09/09/2015  . Hypokalemia 09/09/2015    RAMSEUR,[name], PTA 01/17/2018, 4:07 PM  [facility] [place], [place], [address] Phone: [phone]   Fax:  [phone]  Name: SALEH ULBRICH MRN: [id number] Date of Birth: 1995-12-19

## 2018-01-22 ENCOUNTER — Ambulatory Visit: Payer: Medicare Other | Admitting: Physical Therapy

## 2018-01-22 DIAGNOSIS — R269 Unspecified abnormalities of gait and mobility: Secondary | ICD-10-CM

## 2018-01-22 DIAGNOSIS — M6281 Muscle weakness (generalized): Secondary | ICD-10-CM

## 2018-01-22 DIAGNOSIS — R2681 Unsteadiness on feet: Secondary | ICD-10-CM | POA: Diagnosis not present

## 2018-01-22 DIAGNOSIS — R260 Ataxic gait: Secondary | ICD-10-CM | POA: Diagnosis not present

## 2018-01-22 DIAGNOSIS — R29898 Other symptoms and signs involving the musculoskeletal system: Secondary | ICD-10-CM

## 2018-01-22 NOTE — Therapy (Signed)
[facility] [place], [place], [address] Phone: [phone]   Fax:  [phone]  Physical Therapy Treatment  Patient Details  Name: James Hoffman MRN: [id number] Date of Birth: 02-Dec-1995 Referring Provider: Evelina Dun   Encounter Date: 01/22/2018  PT End of Session - 01/22/18 1610    Visit Number  6    Number of Visits  16    Date for PT Re-Evaluation  02/04/18    PT Start Time  0330 Late arrival.    PT Stop Time  0402    PT Time Calculation (min)  32 min    Activity Tolerance  Patient tolerated treatment well    Behavior During Therapy  [facility] for tasks assessed/performed       Past Medical History:  Diagnosis Date  . Adrenal insufficiency ([place])   . 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 Hydrocephalus   . Osteoporosis   . Thyroid disease     Past Surgical History:  Procedure Laterality Date  . brain tumor removal  August 2012  . HIP SURGERY    . teeth removal      There were no vitals filed for this visit.                   [facility] Adult PT Treatment/Exercise - 01/22/18 0001      Exercises   Exercises  Knee/Hip      Lumbar Exercises: Aerobic   Nustep  Level 5 x 15 minutes.      Lumbar Exercises: Machines for Strengthening   Cybex Knee Extension  10# x 5 minutes.    Leg Press  1 plate x 5 minutes                  PT Long Term Goals - 01/10/18 1722      PT LONG TERM GOAL #1   Title  Ind with an advanced HEP.    Baseline  No knowledge of appropriate ther ex.    Time  8    Period  Weeks    Status  On-going      PT LONG TERM GOAL #2   Title  Increase bilateral LE strength to a solid 4+/5.    Baseline  Bilateral LE strength grades from 3- to 4-/5.    Time  8    Period  Weeks    Status  On-going      PT LONG TERM GOAL #3   Title  Sit to stand x 5 with SBA.    Baseline  Up to min assist required today.    Time  8    Period  Weeks    Status  On-going      PT LONG TERM GOAL #4   Title  Walk in clinic with CGA/SBA with a cane 500 feet.    Baseline  Patient requires HHA to ambulate safely.    Time  8    Period  Weeks    Status  On-going      PT LONG TERM GOAL #5   Title  Improve Berg score by 10-12 points.    Time  8    Period  Weeks    Status  On-going            Plan - 01/22/18 1638    Clinical Impression Statement  Patient did well today though with a bright affect.  HHA required constantly while ambulating.  Leg  Press performed with ball squeeze maintained throughout.    PT Treatment/Interventions  ADLs/Self Care Home Management;Functional mobility training;Stair training;Gait training;Therapeutic activities;Therapeutic exercise;Balance training;Neuromuscular re-education;Patient/family education       Patient will benefit from skilled therapeutic intervention in order to improve the following deficits and impairments:     Visit Diagnosis: Unsteadiness on feet  Muscle weakness (generalized)  Unsteadiness  Ataxic gait  Abnormality of gait  Weakness of both lower extremities     Problem List Patient Active Problem List   Diagnosis Date Noted  . Gait instability 12/19/2016  . Hypogonadism, male 07/19/2016  . Normocytic anemia 04/14/2016  . Muscular deconditioning 09/17/2015  . Other specified hypothyroidism 09/09/2015  . Medullary carcinoma ([place]) 09/09/2015  . Adrenal insufficiency ([place]) 09/09/2015  . Hypotension 09/09/2015  . Hyponatremia 09/09/2015  . Hypokalemia 09/09/2015    Jerlean Peralta, [place] MPT 01/22/2018, 4:41 PM  [facility] [address] [place], [place], [address] Phone: [phone]   Fax:  [phone]  Name: James Hoffman MRN: [id number] Date of Birth: [birth year]

## 2018-01-24 ENCOUNTER — Encounter: Payer: Medicare Other | Admitting: Physical Therapy

## 2018-01-29 ENCOUNTER — Ambulatory Visit: Payer: Medicare Other | Admitting: *Deleted

## 2018-01-31 ENCOUNTER — Encounter: Payer: Medicare Other | Admitting: Physical Therapy

## 2018-02-05 ENCOUNTER — Ambulatory Visit: Payer: Medicare Other | Admitting: *Deleted

## 2018-02-05 DIAGNOSIS — R2681 Unsteadiness on feet: Secondary | ICD-10-CM

## 2018-02-05 DIAGNOSIS — M6281 Muscle weakness (generalized): Secondary | ICD-10-CM

## 2018-02-05 DIAGNOSIS — R29898 Other symptoms and signs involving the musculoskeletal system: Secondary | ICD-10-CM

## 2018-02-05 DIAGNOSIS — R260 Ataxic gait: Secondary | ICD-10-CM | POA: Diagnosis not present

## 2018-02-05 DIAGNOSIS — R269 Unspecified abnormalities of gait and mobility: Secondary | ICD-10-CM | POA: Diagnosis not present

## 2018-02-05 NOTE — Therapy (Signed)
[facility] [place], [place], [address] Phone: [phone]   Fax:  [phone]  Physical Therapy Treatment  Patient Details  Name: James Hoffman MRN: [id number] Date of Birth: 03/13/1996 Referring Provider: Evelina Dun   Encounter Date: 02/05/2018  PT End of Session - 02/05/18 1552    Visit Number  7    Number of Visits  16    Date for PT Re-Evaluation  02/04/18    PT Start Time  7253    PT Stop Time  1603    PT Time Calculation (min)  48 min       Past Medical History:  Diagnosis Date  . Adrenal insufficiency ([place])   . 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 Hydrocephalus   . Osteoporosis   . Thyroid disease     Past Surgical History:  Procedure Laterality Date  . brain tumor removal  August 2012  . HIP SURGERY    . teeth removal      There were no vitals filed for this visit.  Subjective Assessment - 02/05/18 1553    Subjective  Feeling stronger today. I want to work on the wt machines    Limitations  Walking    Patient Stated Goals  Get stronger and walk better.    Currently in Pain?  No/denies                No data recorded       [facility] Adult PT Treatment/Exercise - 02/05/18 0001      Ambulation/Gait   Ambulation/Gait  Yes    Ambulation Distance (Feet)  50 Feet    Assistive device  1 person hand held assist    Gait Pattern  Ataxic    Ambulation Surface  Level;Indoor    Gait Comments  patient required Min to mod HHA today with gait still due to ataxia and LOB. Uses WC and walker still at home      Exercises   Exercises  Knee/Hip      Lumbar Exercises: Aerobic   Nustep  Level 5 x 15 minutes.      Lumbar Exercises: Machines for Strengthening   Cybex Knee Extension  10#, 20#s x 54minutes.    Leg Press  1, and 1 1/2 plates x 7                  PT Long Term Goals - 01/10/18 1722      PT LONG TERM GOAL #1   Title  Ind with an advanced HEP.    Baseline   No knowledge of appropriate ther ex.    Time  8    Period  Weeks    Status  On-going      PT LONG TERM GOAL #2   Title  Increase bilateral LE strength to a solid 4+/5.    Baseline  Bilateral LE strength grades from 3- to 4-/5.    Time  8    Period  Weeks    Status  On-going      PT LONG TERM GOAL #3   Title  Sit to stand x 5 with SBA.    Baseline  Up to min assist required today.    Time  8    Period  Weeks    Status  On-going      PT LONG TERM GOAL #4   Title  Walk in clinic with CGA/SBA with a cane 500  feet.    Baseline  Patient requires HHA to ambulate safely.    Time  8    Period  Weeks    Status  On-going      PT LONG TERM GOAL #5   Title  Improve Berg score by 10-12 points.    Time  8    Period  Weeks    Status  On-going            Plan - 02/05/18 1611    Clinical Impression Statement  Pt arrived today feeling stronger and wanted to work on the machines foe LE srengthening. He was able to progress wts today and had more endurance in LEs. He still needs cueing on leg press to keep LT knee from hyper-extending and keeping tension on his quads for control.During gait he needed min/mod assist still due to ataxia and LOB.    Clinical Presentation  Stable    Clinical Decision Making  Moderate    Rehab Potential  Good    PT Frequency  2x / week    PT Duration  8 weeks    PT Treatment/Interventions  ADLs/Self Care Home Management;Functional mobility training;Stair training;Gait training;Therapeutic activities;Therapeutic exercise;Balance training;Neuromuscular re-education;Patient/family education    PT Next Visit Plan  cont with POC for gait/balance activites and bilateral LE strengthning exercises.    Consulted and Agree with Plan of Care  Patient       Patient will benefit from skilled therapeutic intervention in order to improve the following deficits and impairments:     Visit Diagnosis: Unsteadiness on feet  Muscle weakness  (generalized)  Unsteadiness  Ataxic gait  Abnormality of gait  Weakness of both lower extremities     Problem List Patient Active Problem List   Diagnosis Date Noted  . Gait instability 12/19/2016  . Hypogonadism, male 07/19/2016  . Normocytic anemia 04/14/2016  . Muscular deconditioning 09/17/2015  . Other specified hypothyroidism 09/09/2015  . Medullary carcinoma ([place]) 09/09/2015  . Adrenal insufficiency ([place]) 09/09/2015  . Hypotension 09/09/2015  . Hyponatremia 09/09/2015  . Hypokalemia 09/09/2015    Devlin Brink,[name], PTA 02/05/2018, 4:17 PM  [facility] [place], [place], [address] Phone: [phone]   Fax:  [phone]  Name: James Hoffman MRN: [id number] Date of Birth: [birth year]

## 2018-02-07 ENCOUNTER — Encounter: Payer: Medicare Other | Admitting: *Deleted

## 2018-02-12 ENCOUNTER — Ambulatory Visit: Payer: Medicare Other | Attending: Family | Admitting: *Deleted

## 2018-02-12 DIAGNOSIS — R2681 Unsteadiness on feet: Secondary | ICD-10-CM

## 2018-02-12 DIAGNOSIS — R269 Unspecified abnormalities of gait and mobility: Secondary | ICD-10-CM | POA: Diagnosis not present

## 2018-02-12 DIAGNOSIS — R260 Ataxic gait: Secondary | ICD-10-CM | POA: Diagnosis not present

## 2018-02-12 DIAGNOSIS — R29898 Other symptoms and signs involving the musculoskeletal system: Secondary | ICD-10-CM | POA: Diagnosis not present

## 2018-02-12 DIAGNOSIS — M6281 Muscle weakness (generalized): Secondary | ICD-10-CM | POA: Diagnosis not present

## 2018-02-12 NOTE — Therapy (Signed)
[facility] [place], [place], [address] Phone: [phone]   Fax:  [phone]  Physical Therapy Treatment  Patient Details  Name: James Hoffman MRN: [id number] Date of Birth: 12-23-1995 Referring Provider: Evelina Dun   Encounter Date: 02/12/2018  PT End of Session - 02/12/18 1546    Visit Number  8    Number of Visits  16    Date for PT Re-Evaluation  02/04/18    PT Start Time  1638    PT Stop Time  4536    PT Time Calculation (min)  49 min       Past Medical History:  Diagnosis Date  . Adrenal insufficiency ([place])   . 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 Hydrocephalus   . Osteoporosis   . Thyroid disease     Past Surgical History:  Procedure Laterality Date  . brain tumor removal  August 2012  . HIP SURGERY    . teeth removal      There were no vitals filed for this visit.  Subjective Assessment - 02/12/18 1545    Subjective  Feeling stronger today. I want to work on the wt machines. Feeling good today    Limitations  Walking    Patient Stated Goals  Get stronger and walk better.                       [place] Adult PT Treatment/Exercise - 02/12/18 0001      Ambulation/Gait   Ambulation/Gait  Yes    Ambulation Distance (Feet)  45 Feet    Assistive device  1 person hand held assist    Gait Pattern  Ataxic    Ambulation Surface  Level;Indoor    Gait Comments  patient required Min to mod HHA today with gait still due to ataxia and LOB. Uses WC and walker still at home      Lumbar Exercises: Aerobic   Nustep  Level 5-10 x 15 minutes.      Lumbar Exercises: Machines for Strengthening   Cybex Knee Flexion  40# 40 total    Leg Press  1, and 1 1/2 plates x 40                  PT Long Term Goals - 01/10/18 1722      PT LONG TERM GOAL #1   Title  Ind with an advanced HEP.    Baseline  No knowledge of appropriate ther ex.    Time  8    Period  Weeks    Status  On-going      PT LONG TERM GOAL #2   Title  Increase bilateral LE strength to a solid 4+/5.    Baseline  Bilateral LE strength grades from 3- to 4-/5.    Time  8    Period  Weeks    Status  On-going      PT LONG TERM GOAL #3   Title  Sit to stand x 5 with SBA.    Baseline  Up to min assist required today.    Time  8    Period  Weeks    Status  On-going      PT LONG TERM GOAL #4   Title  Walk in clinic with CGA/SBA with a cane 500 feet.    Baseline  Patient requires HHA to ambulate safely.    Time  8  Period  Weeks    Status  On-going      PT LONG TERM GOAL #5   Title  Improve Berg score by 10-12 points.    Time  8    Period  Weeks    Status  On-going            Plan - 02/12/18 1605    Clinical Impression Statement  Pt arrived today feeling better with more energy and was able to perform more Reps on strengthening Act.'s as well as increase resistance on some. He was able to control descent better on leg press and needed less cues. His gait was still  ataxic , but needed just  HHA and no LOB today    Clinical Presentation  Stable    Clinical Decision Making  Moderate    Rehab Potential  Good    PT Frequency  2x / week    PT Duration  8 weeks    PT Treatment/Interventions  ADLs/Self Care Home Management;Functional mobility training;Stair training;Gait training;Therapeutic activities;Therapeutic exercise;Balance training;Neuromuscular re-education;Patient/family education    PT Next Visit Plan  cont with POC for gait/balance activites and bilateral LE strengthning exercises.    Consulted and Agree with Plan of Care  Patient       Patient will benefit from skilled therapeutic intervention in order to improve the following deficits and impairments:  Abnormal gait, Decreased activity tolerance, Decreased strength, Decreased coordination, Decreased balance, Pain  Visit Diagnosis: Unsteadiness on feet  Muscle weakness (generalized)  Unsteadiness  Ataxic  gait  Abnormality of gait  Weakness of both lower extremities     Problem List Patient Active Problem List   Diagnosis Date Noted  . Gait instability 12/19/2016  . Hypogonadism, male 07/19/2016  . Normocytic anemia 04/14/2016  . Muscular deconditioning 09/17/2015  . Other specified hypothyroidism 09/09/2015  . Medullary carcinoma ([place]) 09/09/2015  . Adrenal insufficiency ([place]) 09/09/2015  . Hypotension 09/09/2015  . Hyponatremia 09/09/2015  . Hypokalemia 09/09/2015    RAMSEUR,[name], PTA 02/12/2018, 4:16 PM  [facility] [place], [place], [address] Phone: [phone]   Fax:  [phone]  Name: James Hoffman MRN: [id number] Date of Birth: 03/03/1996

## 2018-02-14 ENCOUNTER — Encounter: Payer: Medicare Other | Admitting: *Deleted

## 2018-02-14 ENCOUNTER — Ambulatory Visit: Payer: Medicare Other | Admitting: *Deleted

## 2018-02-14 DIAGNOSIS — R269 Unspecified abnormalities of gait and mobility: Secondary | ICD-10-CM | POA: Diagnosis not present

## 2018-02-14 DIAGNOSIS — M6281 Muscle weakness (generalized): Secondary | ICD-10-CM | POA: Diagnosis not present

## 2018-02-14 DIAGNOSIS — R29898 Other symptoms and signs involving the musculoskeletal system: Secondary | ICD-10-CM | POA: Diagnosis not present

## 2018-02-14 DIAGNOSIS — R260 Ataxic gait: Secondary | ICD-10-CM

## 2018-02-14 DIAGNOSIS — R2681 Unsteadiness on feet: Secondary | ICD-10-CM | POA: Diagnosis not present

## 2018-02-14 NOTE — Therapy (Signed)
[facility] [place], [place], [address] Phone: [phone]   Fax:  [phone]  Physical Therapy Treatment  Patient Details  Name: LEEON Hoffman MRN: [id number] Date of Birth: Apr 12, 1996 Referring Provider: Evelina Dun   Encounter Date: 02/14/2018  PT End of Session - 02/14/18 1535    Visit Number  9    Number of Visits  16    Date for PT Re-Evaluation  03/18/18    PT Start Time  9373    PT Stop Time  1600    PT Time Calculation (min)  44 min       Past Medical History:  Diagnosis Date  . Adrenal insufficiency ([place])   . 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 Hydrocephalus   . Osteoporosis   . Thyroid disease     Past Surgical History:  Procedure Laterality Date  . brain tumor removal  August 2012  . HIP SURGERY    . teeth removal      There were no vitals filed for this visit.  Subjective Assessment - 02/14/18 1534    Subjective  Feeling stronger today. I want to work on the wt machines. Feeling good today. No soreness    Limitations  Walking    Patient Stated Goals  Get stronger and walk better.                       [place] Adult PT Treatment/Exercise - 02/14/18 0001      Ambulation/Gait   Ambulation Distance (Feet)  45 Feet    Assistive device  1 person hand held assist    Gait Pattern  Ataxic    Ambulation Surface  Level;Indoor    Gait Comments  patient required Min to mod HHA today with gait still due to ataxia and LOB. Uses WC and walker still at home      Lumbar Exercises: Aerobic   Nustep  Leve 7 today x 15 minutes total with 2 rest breaks x 1 min 1309 steps      Lumbar Exercises: Machines for Strengthening   Cybex Knee Extension  10#, 20#s x 88mnutes.    Cybex Knee Flexion  40# 40 total    Leg Press  1, and 1 1/2 plates x 40                  PT Long Term Goals - 02/14/18 1557      PT LONG TERM GOAL #3   Title  Sit to stand x 5 with SBA.    Baseline  Up to min assist required today.    Time  8    Period  Weeks    Status  Achieved            Plan - 02/14/18 1535    Clinical Impression Statement  Pt arrived today feeling fairly well. He was able to perform all LE strengthening Exs again and Nustep with increased resistance. HHA with gait again, but ataxic still and needs HHA or walker due to balance deficit..Marland KitchenHe is doing much better with transfers and only needed SBA x 5 LTG met    Clinical Presentation  Stable    Rehab Potential  Good    PT Frequency  2x / week    PT Duration  8 weeks    PT Treatment/Interventions  ADLs/Self Care Home Management;Functional mobility training;Stair training;Gait training;Therapeutic activities;Therapeutic exercise;Balance training;Neuromuscular re-education;Patient/family education  PT Next Visit Plan  cont with POC for gait/balance activites and bilateral LE strengthning exercises.    Consulted and Agree with Plan of Care  Patient       Patient will benefit from skilled therapeutic intervention in order to improve the following deficits and impairments:  Abnormal gait, Decreased activity tolerance, Decreased strength, Decreased coordination, Decreased balance, Pain  Visit Diagnosis: Unsteadiness on feet  Muscle weakness (generalized)  Unsteadiness  Ataxic gait  Abnormality of gait  Weakness of both lower extremities     Problem List Patient Active Problem List   Diagnosis Date Noted  . Gait instability 12/19/2016  . Hypogonadism, male 07/19/2016  . Normocytic anemia 04/14/2016  . Muscular deconditioning 09/17/2015  . Other specified hypothyroidism 09/09/2015  . Medullary carcinoma ([place]) 09/09/2015  . Adrenal insufficiency ([place]) 09/09/2015  . Hypotension 09/09/2015  . Hyponatremia 09/09/2015  . Hypokalemia 09/09/2015    Duvall Comes,[name], PTA 02/14/2018, 4:00 PM  [facility] [address] [place], [place], [address] Phone:  [phone]   Fax:  [phone]  Name: James Hoffman MRN: [id number] Date of Birth: 1996-03-11

## 2018-02-19 ENCOUNTER — Encounter: Payer: Medicare Other | Admitting: *Deleted

## 2018-02-21 ENCOUNTER — Encounter: Payer: Medicare Other | Admitting: *Deleted

## 2018-02-26 ENCOUNTER — Ambulatory Visit: Payer: Medicare Other | Admitting: *Deleted

## 2018-02-26 DIAGNOSIS — R260 Ataxic gait: Secondary | ICD-10-CM

## 2018-02-26 DIAGNOSIS — M6281 Muscle weakness (generalized): Secondary | ICD-10-CM | POA: Diagnosis not present

## 2018-02-26 DIAGNOSIS — R2681 Unsteadiness on feet: Secondary | ICD-10-CM | POA: Diagnosis not present

## 2018-02-26 DIAGNOSIS — R269 Unspecified abnormalities of gait and mobility: Secondary | ICD-10-CM | POA: Diagnosis not present

## 2018-02-26 DIAGNOSIS — R29898 Other symptoms and signs involving the musculoskeletal system: Secondary | ICD-10-CM | POA: Diagnosis not present

## 2018-02-26 NOTE — Therapy (Signed)
[facility] [place], [place], [address] Phone: [phone]   Fax:  [phone]  Physical Therapy Treatment  Patient Details  Name: James Hoffman MRN: [id number] Date of Birth: 03/28/1996 Referring Provider: Evelina Dun   Encounter Date: 02/26/2018  PT End of Session - 02/26/18 1604    Visit Number  10    Number of Visits  16    Date for PT Re-Evaluation  03/18/18    PT Start Time  7035    PT Stop Time  1552 Decreased Rx as per Pt.    PT Time Calculation (min)  37 min       Past Medical History:  Diagnosis Date  . Adrenal insufficiency ([place])   . 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 Hydrocephalus   . Osteoporosis   . Thyroid disease     Past Surgical History:  Procedure Laterality Date  . brain tumor removal  August 2012  . HIP SURGERY    . teeth removal      There were no vitals filed for this visit.  Subjective Assessment - 02/26/18 1547    Subjective  My RT knee was swollen last week. Take it easy today    Limitations  Walking    Patient Stated Goals  Get stronger and walk better.    Currently in Pain?  Yes    Pain Score  2     Pain Location  Knee    Pain Orientation  Right    Pain Descriptors / Indicators  Aching                       [facility] Adult PT Treatment/Exercise - 02/26/18 0001      Ambulation/Gait   Ambulation/Gait  Yes    Ambulation Distance (Feet)  50 Feet    Assistive device  1 person hand held assist    Gait Pattern  Ataxic antalgic    Ambulation Surface  Level;Indoor    Gait Comments  patient required Min to mod HHA today with gait still due to ataxia and pain complaints in LT leg during WTB. Uses WC and walker still at home      Lumbar Exercises: Aerobic   Nustep  Leve 5 today x 15 minutes      Lumbar Exercises: Machines for Strengthening   Cybex Knee Extension  10#, 20#s x 58minutes.    Cybex Knee Flexion  40# 40 total      Lumbar Exercises:  Supine   Ab Set  10 reps    Bridge  15 reps                  PT Long Term Goals - 02/14/18 1557      PT LONG TERM GOAL #3   Title  Sit to stand x 5 with SBA.    Baseline  Up to min assist required today.    Time  8    Period  Weeks    Status  Achieved            Plan - 02/26/18 1614    Clinical Impression Statement  Pt arrived today complaining of his RT knee swelling up last week and he had to Cx. Today he was also C/O LT RT thigh pain during gait and reports that the RT leg is the one with the IM rod in it. HHA with gait today with antalgic  and ataxic  gait pattern. Decreased Rx as per Pt.    Clinical Presentation  Stable    Rehab Potential  Good    PT Frequency  2x / week    PT Duration  8 weeks    PT Treatment/Interventions  ADLs/Self Care Home Management;Functional mobility training;Stair training;Gait training;Therapeutic activities;Therapeutic exercise;Balance training;Neuromuscular re-education;Patient/family education    Consulted and Agree with Plan of Care  Patient       Patient will benefit from skilled therapeutic intervention in order to improve the following deficits and impairments:  Abnormal gait, Decreased activity tolerance, Decreased strength, Decreased coordination, Decreased balance, Pain  Visit Diagnosis: Unsteadiness on feet  Muscle weakness (generalized)  Unsteadiness  Ataxic gait  Abnormality of gait     Problem List Patient Active Problem List   Diagnosis Date Noted  . Gait instability 12/19/2016  . Hypogonadism, male 07/19/2016  . Normocytic anemia 04/14/2016  . Muscular deconditioning 09/17/2015  . Other specified hypothyroidism 09/09/2015  . Medullary carcinoma ([place]) 09/09/2015  . Adrenal insufficiency ([place]) 09/09/2015  . Hypotension 09/09/2015  . Hyponatremia 09/09/2015  . Hypokalemia 09/09/2015    Corrie Brannen,[name], PTA 02/26/2018, 5:13 PM  [facility] [place], [place], [address] Phone: [phone]   Fax:  [phone]  Name: James Hoffman MRN: [id number] Date of Birth: 01/18/1996

## 2018-02-28 ENCOUNTER — Ambulatory Visit: Payer: Medicare Other | Admitting: *Deleted

## 2018-02-28 DIAGNOSIS — R269 Unspecified abnormalities of gait and mobility: Secondary | ICD-10-CM | POA: Diagnosis not present

## 2018-02-28 DIAGNOSIS — M6281 Muscle weakness (generalized): Secondary | ICD-10-CM

## 2018-02-28 DIAGNOSIS — R29898 Other symptoms and signs involving the musculoskeletal system: Secondary | ICD-10-CM | POA: Diagnosis not present

## 2018-02-28 DIAGNOSIS — R2681 Unsteadiness on feet: Secondary | ICD-10-CM

## 2018-02-28 DIAGNOSIS — R260 Ataxic gait: Secondary | ICD-10-CM

## 2018-02-28 NOTE — Therapy (Signed)
[facility] [place], [place], [address] Phone: [phone]   Fax:  [phone]  Physical Therapy Treatment  Patient Details  Name: James Hoffman MRN: [id number] Date of Birth: 1996-10-20 Referring Provider: Evelina Dun   Encounter Date: 02/28/2018  PT End of Session - 02/28/18 1549    Visit Number  11    Number of Visits  16    Date for PT Re-Evaluation  03/18/18    PT Start Time  4034    PT Stop Time  1605    PT Time Calculation (min)  50 min       Past Medical History:  Diagnosis Date  . Adrenal insufficiency ([place])   . 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 Hydrocephalus   . Osteoporosis   . Thyroid disease     Past Surgical History:  Procedure Laterality Date  . brain tumor removal  August 2012  . HIP SURGERY    . teeth removal      There were no vitals filed for this visit.                    [place] Adult PT Treatment/Exercise - 02/28/18 0001      Ambulation/Gait   Ambulation/Gait  Yes    Ambulation Distance (Feet)  50 Feet    Assistive device  1 person hand held assist    Gait Pattern  Ataxic    Ambulation Surface  Level;Indoor    Gait Comments  patient required Min to mod HHA today with gait still due to ataxia and pain complaints in LT leg during WTB. Uses WC and walker still at home      Lumbar Exercises: Aerobic   Nustep  Level  5 today x 15 minutes      Lumbar Exercises: Machines for Strengthening   Cybex Knee Extension  20#s x 68minutes.    Cybex Knee Flexion  40# 40 total    Leg Press  2 plates  3x 15      Knee/Hip Exercises: Standing   Rocker Board  5 minutes balance  with HHA                  PT Long Term Goals - 02/14/18 1557      PT LONG TERM GOAL #3   Title  Sit to stand x 5 with SBA.    Baseline  Up to min assist required today.    Time  8    Period  Weeks    Status  Achieved            Plan - 02/28/18 1802    Clinical  Impression Statement  Pt arrived today in good spirits and reports decreased pain in his knee and coul do a little more today. He was able to complete all therex and  some balance act.'s without complaints. He was unable to meet LTG for gait due to balance deficits and ataxia.     Rehab Potential  Good    PT Frequency  2x / week    PT Duration  8 weeks    PT Treatment/Interventions  ADLs/Self Care Home Management;Functional mobility training;Stair training;Gait training;Therapeutic activities;Therapeutic exercise;Balance training;Neuromuscular re-education;Patient/family education    PT Next Visit Plan  cont with POC for gait/balance activites and bilateral LE strengthning exercises.    Consulted and Agree with Plan of Care  Patient       Patient will benefit  from skilled therapeutic intervention in order to improve the following deficits and impairments:  Abnormal gait, Decreased activity tolerance, Decreased strength, Decreased coordination, Decreased balance, Pain  Visit Diagnosis: Unsteadiness on feet  Muscle weakness (generalized)  Unsteadiness  Ataxic gait  Abnormality of gait  Weakness of both lower extremities     Problem List Patient Active Problem List   Diagnosis Date Noted  . Gait instability 12/19/2016  . Hypogonadism, male 07/19/2016  . Normocytic anemia 04/14/2016  . Muscular deconditioning 09/17/2015  . Other specified hypothyroidism 09/09/2015  . Medullary carcinoma ([place]) 09/09/2015  . Adrenal insufficiency ([place]) 09/09/2015  . Hypotension 09/09/2015  . Hyponatremia 09/09/2015  . Hypokalemia 09/09/2015    Christiona Siddique,[name], PTA 02/28/2018, 6:09 PM  [facility] [place], [place], [address] Phone: [phone]   Fax:  [phone]  Name: James Hoffman MRN: [id number] Date of Birth: [birth year]

## 2018-03-05 ENCOUNTER — Ambulatory Visit: Payer: Medicare Other | Admitting: "Endocrinology

## 2018-03-06 ENCOUNTER — Ambulatory Visit: Payer: Medicare Other | Admitting: "Endocrinology

## 2018-03-06 ENCOUNTER — Other Ambulatory Visit: Payer: Self-pay | Admitting: "Endocrinology

## 2018-03-06 ENCOUNTER — Other Ambulatory Visit: Payer: Medicare Other

## 2018-03-06 DIAGNOSIS — E291 Testicular hypofunction: Secondary | ICD-10-CM

## 2018-03-06 DIAGNOSIS — E039 Hypothyroidism, unspecified: Secondary | ICD-10-CM

## 2018-03-07 ENCOUNTER — Encounter: Payer: Self-pay | Admitting: Physical Therapy

## 2018-03-07 ENCOUNTER — Encounter: Payer: Medicare Other | Admitting: Physical Therapy

## 2018-03-07 ENCOUNTER — Ambulatory Visit: Payer: Medicare Other | Admitting: Physical Therapy

## 2018-03-07 DIAGNOSIS — R260 Ataxic gait: Secondary | ICD-10-CM

## 2018-03-07 DIAGNOSIS — R269 Unspecified abnormalities of gait and mobility: Secondary | ICD-10-CM | POA: Diagnosis not present

## 2018-03-07 DIAGNOSIS — R29898 Other symptoms and signs involving the musculoskeletal system: Secondary | ICD-10-CM | POA: Diagnosis not present

## 2018-03-07 DIAGNOSIS — M6281 Muscle weakness (generalized): Secondary | ICD-10-CM | POA: Diagnosis not present

## 2018-03-07 DIAGNOSIS — R2681 Unsteadiness on feet: Secondary | ICD-10-CM | POA: Diagnosis not present

## 2018-03-07 NOTE — Therapy (Signed)
[facility] [place], [place], [address] Phone: [phone]   Fax:  [phone]  Physical Therapy Treatment  Patient Details  Name: James Hoffman MRN: [id number] Date of Birth: September 07, 1996 Referring Provider: Evelina Dun   Encounter Date: 03/07/2018  PT End of Session - 03/07/18 1835    Visit Number  12    Number of Visits  16    Date for PT Re-Evaluation  03/18/18    PT Start Time  7062    PT Stop Time  0352 Abbreviated treatment due to fatigue.    PT Time Calculation (min)  35 min    Activity Tolerance  Patient limited by fatigue    Behavior During Therapy  [facility] for tasks assessed/performed       Past Medical History:  Diagnosis Date  . Adrenal insufficiency ([place])   . 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 Hydrocephalus   . Osteoporosis   . Thyroid disease     Past Surgical History:  Procedure Laterality Date  . brain tumor removal  August 2012  . HIP SURGERY    . teeth removal      There were no vitals filed for this visit.  Subjective Assessment - 03/07/18 1836    Subjective  I'm tired.    Pain Score  2     Pain Location  Knee    Pain Orientation  Right    Pain Descriptors / Indicators  Aching                       [facility] Adult PT Treatment/Exercise - 03/07/18 0001      Exercises   Exercises  Knee/Hip      Lumbar Exercises: Aerobic   Nustep  Level 5 x 15 minutes.      Lumbar Exercises: Machines for Strengthening   Cybex Knee Extension  10# x 4 minutes.    Cybex Knee Flexion  30# x 5 minutes.    Leg Press  2 plates x 3 minutes.                  PT Long Term Goals - 02/14/18 1557      PT LONG TERM GOAL #3   Title  Sit to stand x 5 with SBA.    Baseline  Up to min assist required today.    Time  8    Period  Weeks    Status  Achieved            Plan - 03/07/18 1839    Clinical Impression Statement  Patient did well today but was limited by  fatigue today.    PT Treatment/Interventions  ADLs/Self Care Home Management;Functional mobility training;Stair training;Gait training;Therapeutic activities;Therapeutic exercise;Balance training;Neuromuscular re-education;Patient/family education    PT Next Visit Plan  cont with POC for gait/balance activites and bilateral LE strengthning exercises.    Consulted and Agree with Plan of Care  Patient       Patient will benefit from skilled therapeutic intervention in order to improve the following deficits and impairments:  Abnormal gait, Decreased activity tolerance, Decreased strength, Decreased coordination, Decreased balance, Pain  Visit Diagnosis: Unsteadiness  Muscle weakness (generalized)  Ataxic gait     Problem List Patient Active Problem List   Diagnosis Date Noted  . Gait instability 12/19/2016  . Hypogonadism, male 07/19/2016  . Normocytic anemia 04/14/2016  . Muscular deconditioning 09/17/2015  . Other specified hypothyroidism  09/09/2015  . Medullary carcinoma ([place]) 09/09/2015  . Adrenal insufficiency ([place]) 09/09/2015  . Hypotension 09/09/2015  . Hyponatremia 09/09/2015  . Hypokalemia 09/09/2015    Sadrac Zeoli, [place]  MPT 03/07/2018, 6:42 PM  [facility] [address] [place], [place], [address] Phone: [phone]   Fax:  [phone]  Name: JAILEN COWARD MRN: [id number] Date of Birth: 11-25-1995

## 2018-03-09 LAB — COMPREHENSIVE METABOLIC PANEL
ALT: 21 IU/L (ref 0–44)
AST: 21 IU/L (ref 0–40)
Albumin/Globulin Ratio: 2 (ref 1.2–2.2)
Albumin: 4 g/dL (ref 3.5–5.5)
Alkaline Phosphatase: 93 IU/L (ref 39–117)
BUN/Creatinine Ratio: 8 — ABNORMAL LOW (ref 9–20)
BUN: 6 mg/dL (ref 6–20)
Bilirubin Total: <0.2 mg/dL (ref 0.0–1.2)
CO2: 23 mmol/L (ref 20–29)
Calcium: 9.3 mg/dL (ref 8.7–10.2)
Chloride: 96 mmol/L (ref 96–106)
Creatinine, Ser: 0.79 mg/dL (ref 0.76–1.27)
GFR calc Af Amer: 147 mL/min/{1.73_m2} (ref >59)
GFR calc non Af Amer: 127 mL/min/{1.73_m2} (ref >59)
Globulin, Total: 2 g/dL (ref 1.5–4.5)
Glucose: 72 mg/dL (ref 65–99)
Potassium: 3.5 mmol/L (ref 3.5–5.2)
Sodium: 137 mmol/L (ref 134–144)
Total Protein: 6 g/dL (ref 6.0–8.5)

## 2018-03-09 LAB — TSH: TSH: 0.28 u[IU]/mL — ABNORMAL LOW (ref 0.450–4.500)

## 2018-03-09 LAB — T4, FREE: FREE T4: 1.22 ng/dL (ref 0.82–1.77)

## 2018-03-09 LAB — TESTOSTERONE: TESTOSTERONE: 519 ng/dL (ref 264–916)

## 2018-03-12 ENCOUNTER — Ambulatory Visit: Payer: Medicare Other | Admitting: *Deleted

## 2018-03-12 DIAGNOSIS — R269 Unspecified abnormalities of gait and mobility: Secondary | ICD-10-CM

## 2018-03-12 DIAGNOSIS — R2681 Unsteadiness on feet: Secondary | ICD-10-CM

## 2018-03-12 DIAGNOSIS — M6281 Muscle weakness (generalized): Secondary | ICD-10-CM

## 2018-03-12 DIAGNOSIS — R29898 Other symptoms and signs involving the musculoskeletal system: Secondary | ICD-10-CM

## 2018-03-12 DIAGNOSIS — R260 Ataxic gait: Secondary | ICD-10-CM

## 2018-03-12 NOTE — Therapy (Addendum)
[facility] [place], [place], [address] Phone: [phone]   Fax:  [phone]  Physical Therapy Treatment  Patient Details  Name: James Hoffman MRN: [id number] Date of Birth: [birth year] Referring Provider: Evelina Dun   Encounter Date: 03/12/2018  PT End of Session - 03/12/18 1523    Visit Number  13    Number of Visits  16    Date for PT Re-Evaluation  03/18/18    PT Start Time  4132    PT Stop Time  1558    PT Time Calculation (min)  47 min       Past Medical History:  Diagnosis Date  . Adrenal insufficiency ([place])   . 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 Hydrocephalus   . Osteoporosis   . Thyroid disease     Past Surgical History:  Procedure Laterality Date  . brain tumor removal  August 2012  . HIP SURGERY    . teeth removal      There were no vitals filed for this visit.  Subjective Assessment - 03/12/18 1523    Subjective  Doing ok. A little tired    Limitations  Walking    Patient Stated Goals  Get stronger and walk better.    Currently in Pain?  Yes    Pain Location  Knee    Pain Orientation  Right    Pain Descriptors / Indicators  Aching                       [facility] Adult PT Treatment/Exercise - 03/12/18 0001      Ambulation/Gait   Ambulation/Gait  Yes    Ambulation/Gait Assistance  4: Min assist;3: Mod assist    Ambulation Distance (Feet)  50 Feet    Assistive device  1 person hand held assist    Gait Pattern  Ataxic    Ambulation Surface  Level;Indoor    Gait Comments  patient required Min to mod HHA today with gait still due to ataxia and pain complaints in LT leg during WTB. Uses WC and walker still at home      Exercises   Exercises  Knee/Hip      Lumbar Exercises: Aerobic   Nustep  Level 5 x 15 minutes.      Lumbar Exercises: Machines for Strengthening   Cybex Knee Extension  20#s x 52mnutes.    Cybex Knee Flexion  40# 40 total    Leg Press  2  plates  3x 15      Knee/Hip Exercises: Standing   Rocker Board  -- balance  with HHA                  PT Long Term Goals - 02/14/18 1557      PT LONG TERM GOAL #3   Title  Sit to stand x 5 with SBA.    Baseline  Up to min assist required today.    Time  8    Period  Weeks    Status  Achieved            Plan - 03/12/18 1558    Clinical Impression Statement  Pt did fairly well today, butcomplained of dizziness a couple times during Rx and wanted to focus on strengthening Exs and not balance. He needed Mod assist at one time from sit to stand due to his knee hyper-extending. Gait was with single HHA RT  side and a little more ataxic today with more assistance needed for balance..       Clinical Presentation  Stable    Rehab Potential  Good    PT Frequency  2x / week    PT Duration  8 weeks    PT Next Visit Plan  cont with POC for gait/balance activites and bilateral LE strengthning exercises.    Consulted and Agree with Plan of Care  Patient       Patient will benefit from skilled therapeutic intervention in order to improve the following deficits and impairments:  Abnormal gait, Decreased activity tolerance, Decreased strength, Decreased coordination, Decreased balance, Pain  Visit Diagnosis: Muscle weakness (generalized)  Unsteadiness  Ataxic gait  Unsteadiness on feet  Abnormality of gait  Weakness of both lower extremities     Problem List Patient Active Problem List   Diagnosis Date Noted  . Gait instability 12/19/2016  . Hypogonadism, male 07/19/2016  . Normocytic anemia 04/14/2016  . Muscular deconditioning 09/17/2015  . Other specified hypothyroidism 09/09/2015  . Medullary carcinoma ([place]) 09/09/2015  . Adrenal insufficiency ([place]) 09/09/2015  . Hypotension 09/09/2015  . Hyponatremia 09/09/2015  . Hypokalemia 09/09/2015    Nitisha Civello,[name], PTA 03/12/2018, 4:08 PM  [facility] [place], [place], [address] Phone: [phone]   Fax:  [phone]  Name: James Hoffman MRN: [id number] Date of Birth: 1996/01/29  PHYSICAL THERAPY DISCHARGE SUMMARY  Visits from Start of Care: 13.  Current functional level related to goals / functional outcomes: See above.   Remaining deficits: Continued LOB and gait instability.   Education / Equipment: HEP.  Plan: Patient agrees to discharge.  Patient goals were not met. Patient is being discharged due to not returning since the last visit.  ?????         Mali Applegate MPT

## 2018-03-14 ENCOUNTER — Encounter: Payer: Medicare Other | Admitting: *Deleted

## 2018-03-15 DIAGNOSIS — J14 Pneumonia due to Hemophilus influenzae: Secondary | ICD-10-CM | POA: Diagnosis not present

## 2018-03-15 DIAGNOSIS — R569 Unspecified convulsions: Secondary | ICD-10-CM | POA: Diagnosis not present

## 2018-03-15 DIAGNOSIS — R42 Dizziness and giddiness: Secondary | ICD-10-CM | POA: Diagnosis not present

## 2018-03-15 DIAGNOSIS — Z982 Presence of cerebrospinal fluid drainage device: Secondary | ICD-10-CM | POA: Diagnosis not present

## 2018-03-15 DIAGNOSIS — G911 Obstructive hydrocephalus: Secondary | ICD-10-CM | POA: Diagnosis not present

## 2018-03-15 DIAGNOSIS — R51 Headache: Secondary | ICD-10-CM | POA: Diagnosis not present

## 2018-03-15 DIAGNOSIS — R0602 Shortness of breath: Secondary | ICD-10-CM | POA: Diagnosis not present

## 2018-03-15 DIAGNOSIS — W228XXA Striking against or struck by other objects, initial encounter: Secondary | ICD-10-CM | POA: Diagnosis not present

## 2018-03-15 DIAGNOSIS — J9600 Acute respiratory failure, unspecified whether with hypoxia or hypercapnia: Secondary | ICD-10-CM | POA: Diagnosis not present

## 2018-03-15 DIAGNOSIS — Z430 Encounter for attention to tracheostomy: Secondary | ICD-10-CM | POA: Diagnosis not present

## 2018-03-15 DIAGNOSIS — Y999 Unspecified external cause status: Secondary | ICD-10-CM | POA: Diagnosis not present

## 2018-03-15 DIAGNOSIS — S0990XA Unspecified injury of head, initial encounter: Secondary | ICD-10-CM | POA: Diagnosis not present

## 2018-03-15 DIAGNOSIS — R5383 Other fatigue: Secondary | ICD-10-CM | POA: Diagnosis not present

## 2018-03-15 DIAGNOSIS — A419 Sepsis, unspecified organism: Secondary | ICD-10-CM | POA: Diagnosis not present

## 2018-03-15 DIAGNOSIS — R112 Nausea with vomiting, unspecified: Secondary | ICD-10-CM | POA: Diagnosis not present

## 2018-03-15 DIAGNOSIS — E2740 Unspecified adrenocortical insufficiency: Secondary | ICD-10-CM | POA: Diagnosis not present

## 2018-03-15 DIAGNOSIS — E871 Hypo-osmolality and hyponatremia: Secondary | ICD-10-CM | POA: Diagnosis not present

## 2018-03-15 DIAGNOSIS — R918 Other nonspecific abnormal finding of lung field: Secondary | ICD-10-CM | POA: Diagnosis not present

## 2018-03-15 DIAGNOSIS — R6521 Severe sepsis with septic shock: Secondary | ICD-10-CM | POA: Diagnosis not present

## 2018-03-16 DIAGNOSIS — Z9911 Dependence on respirator [ventilator] status: Secondary | ICD-10-CM | POA: Diagnosis not present

## 2018-03-16 DIAGNOSIS — R509 Fever, unspecified: Secondary | ICD-10-CM | POA: Diagnosis not present

## 2018-03-16 DIAGNOSIS — N189 Chronic kidney disease, unspecified: Secondary | ICD-10-CM | POA: Diagnosis present

## 2018-03-16 DIAGNOSIS — E039 Hypothyroidism, unspecified: Secondary | ICD-10-CM | POA: Diagnosis present

## 2018-03-16 DIAGNOSIS — A419 Sepsis, unspecified organism: Secondary | ICD-10-CM | POA: Diagnosis present

## 2018-03-16 DIAGNOSIS — R000 Tachycardia, unspecified: Secondary | ICD-10-CM | POA: Diagnosis present

## 2018-03-16 DIAGNOSIS — R9431 Abnormal electrocardiogram [ECG] [EKG]: Secondary | ICD-10-CM | POA: Diagnosis not present

## 2018-03-16 DIAGNOSIS — E162 Hypoglycemia, unspecified: Secondary | ICD-10-CM | POA: Diagnosis present

## 2018-03-16 DIAGNOSIS — G919 Hydrocephalus, unspecified: Secondary | ICD-10-CM | POA: Diagnosis not present

## 2018-03-16 DIAGNOSIS — Z960 Presence of urogenital implants: Secondary | ICD-10-CM | POA: Diagnosis not present

## 2018-03-16 DIAGNOSIS — R579 Shock, unspecified: Secondary | ICD-10-CM | POA: Diagnosis not present

## 2018-03-16 DIAGNOSIS — R270 Ataxia, unspecified: Secondary | ICD-10-CM | POA: Diagnosis present

## 2018-03-16 DIAGNOSIS — G9389 Other specified disorders of brain: Secondary | ICD-10-CM | POA: Diagnosis not present

## 2018-03-16 DIAGNOSIS — E876 Hypokalemia: Secondary | ICD-10-CM | POA: Diagnosis present

## 2018-03-16 DIAGNOSIS — Z85841 Personal history of malignant neoplasm of brain: Secondary | ICD-10-CM | POA: Diagnosis not present

## 2018-03-16 DIAGNOSIS — I313 Pericardial effusion (noninflammatory): Secondary | ICD-10-CM | POA: Diagnosis not present

## 2018-03-16 DIAGNOSIS — D649 Anemia, unspecified: Secondary | ICD-10-CM | POA: Diagnosis present

## 2018-03-16 DIAGNOSIS — R0602 Shortness of breath: Secondary | ICD-10-CM | POA: Diagnosis not present

## 2018-03-16 DIAGNOSIS — G40909 Epilepsy, unspecified, not intractable, without status epilepticus: Secondary | ICD-10-CM | POA: Diagnosis not present

## 2018-03-16 DIAGNOSIS — Z430 Encounter for attention to tracheostomy: Secondary | ICD-10-CM | POA: Diagnosis not present

## 2018-03-16 DIAGNOSIS — Z8661 Personal history of infections of the central nervous system: Secondary | ICD-10-CM | POA: Diagnosis not present

## 2018-03-16 DIAGNOSIS — J90 Pleural effusion, not elsewhere classified: Secondary | ICD-10-CM | POA: Diagnosis not present

## 2018-03-16 DIAGNOSIS — J9690 Respiratory failure, unspecified, unspecified whether with hypoxia or hypercapnia: Secondary | ICD-10-CM | POA: Diagnosis not present

## 2018-03-16 DIAGNOSIS — M6282 Rhabdomyolysis: Secondary | ICD-10-CM | POA: Diagnosis present

## 2018-03-16 DIAGNOSIS — R1310 Dysphagia, unspecified: Secondary | ICD-10-CM | POA: Diagnosis present

## 2018-03-16 DIAGNOSIS — Z049 Encounter for examination and observation for unspecified reason: Secondary | ICD-10-CM | POA: Diagnosis not present

## 2018-03-16 DIAGNOSIS — R6521 Severe sepsis with septic shock: Secondary | ICD-10-CM | POA: Diagnosis present

## 2018-03-16 DIAGNOSIS — G911 Obstructive hydrocephalus: Secondary | ICD-10-CM | POA: Diagnosis present

## 2018-03-16 DIAGNOSIS — G9340 Encephalopathy, unspecified: Secondary | ICD-10-CM | POA: Diagnosis not present

## 2018-03-16 DIAGNOSIS — G40901 Epilepsy, unspecified, not intractable, with status epilepticus: Secondary | ICD-10-CM | POA: Diagnosis present

## 2018-03-16 DIAGNOSIS — J9600 Acute respiratory failure, unspecified whether with hypoxia or hypercapnia: Secondary | ICD-10-CM | POA: Diagnosis not present

## 2018-03-16 DIAGNOSIS — T424X5A Adverse effect of benzodiazepines, initial encounter: Secondary | ICD-10-CM | POA: Diagnosis not present

## 2018-03-16 DIAGNOSIS — G8194 Hemiplegia, unspecified affecting left nondominant side: Secondary | ICD-10-CM | POA: Diagnosis present

## 2018-03-16 DIAGNOSIS — Z982 Presence of cerebrospinal fluid drainage device: Secondary | ICD-10-CM | POA: Diagnosis not present

## 2018-03-16 DIAGNOSIS — J14 Pneumonia due to Hemophilus influenzae: Secondary | ICD-10-CM | POA: Diagnosis present

## 2018-03-16 DIAGNOSIS — E871 Hypo-osmolality and hyponatremia: Secondary | ICD-10-CM | POA: Diagnosis present

## 2018-03-16 DIAGNOSIS — R7989 Other specified abnormal findings of blood chemistry: Secondary | ICD-10-CM | POA: Diagnosis not present

## 2018-03-16 DIAGNOSIS — R918 Other nonspecific abnormal finding of lung field: Secondary | ICD-10-CM | POA: Diagnosis not present

## 2018-03-16 DIAGNOSIS — Z7952 Long term (current) use of systemic steroids: Secondary | ICD-10-CM | POA: Diagnosis not present

## 2018-03-16 DIAGNOSIS — E2740 Unspecified adrenocortical insufficiency: Secondary | ICD-10-CM | POA: Diagnosis present

## 2018-03-16 DIAGNOSIS — R569 Unspecified convulsions: Secondary | ICD-10-CM | POA: Diagnosis not present

## 2018-03-16 DIAGNOSIS — I959 Hypotension, unspecified: Secondary | ICD-10-CM | POA: Diagnosis present

## 2018-03-16 DIAGNOSIS — Z9221 Personal history of antineoplastic chemotherapy: Secondary | ICD-10-CM | POA: Diagnosis not present

## 2018-03-19 ENCOUNTER — Encounter: Payer: Medicare Other | Admitting: *Deleted

## 2018-03-20 DIAGNOSIS — J9601 Acute respiratory failure with hypoxia: Secondary | ICD-10-CM

## 2018-03-20 HISTORY — DX: Acute respiratory failure with hypoxia: J96.01

## 2018-03-21 ENCOUNTER — Ambulatory Visit: Payer: Medicare Other | Admitting: "Endocrinology

## 2018-03-26 ENCOUNTER — Encounter: Payer: Medicare Other | Admitting: *Deleted

## 2018-03-28 ENCOUNTER — Encounter: Payer: Medicare Other | Admitting: *Deleted

## 2018-04-02 ENCOUNTER — Other Ambulatory Visit: Payer: Self-pay | Admitting: Family Medicine

## 2018-04-03 ENCOUNTER — Other Ambulatory Visit: Payer: Self-pay | Admitting: "Endocrinology

## 2018-04-03 DIAGNOSIS — E038 Other specified hypothyroidism: Secondary | ICD-10-CM

## 2018-04-17 ENCOUNTER — Other Ambulatory Visit: Payer: Self-pay | Admitting: "Endocrinology

## 2018-04-25 ENCOUNTER — Ambulatory Visit: Payer: Medicare Other | Admitting: Family Medicine

## 2018-05-01 ENCOUNTER — Encounter: Payer: Self-pay | Admitting: Family Medicine

## 2018-05-05 ENCOUNTER — Other Ambulatory Visit: Payer: Self-pay | Admitting: "Endocrinology

## 2018-05-07 NOTE — Telephone Encounter (Signed)
Last OV 12-11-17. Pt requesting testosterone

## 2018-05-08 DIAGNOSIS — F17210 Nicotine dependence, cigarettes, uncomplicated: Secondary | ICD-10-CM | POA: Diagnosis not present

## 2018-05-08 DIAGNOSIS — H6122 Impacted cerumen, left ear: Secondary | ICD-10-CM | POA: Diagnosis not present

## 2018-05-12 DIAGNOSIS — H9192 Unspecified hearing loss, left ear: Secondary | ICD-10-CM | POA: Insufficient documentation

## 2018-05-30 ENCOUNTER — Other Ambulatory Visit: Payer: Self-pay | Admitting: "Endocrinology

## 2018-05-30 DIAGNOSIS — E038 Other specified hypothyroidism: Secondary | ICD-10-CM

## 2018-05-31 ENCOUNTER — Telehealth: Payer: Self-pay | Admitting: Family Medicine

## 2018-06-21 DIAGNOSIS — E039 Hypothyroidism, unspecified: Secondary | ICD-10-CM | POA: Diagnosis not present

## 2018-06-21 DIAGNOSIS — T50905A Adverse effect of unspecified drugs, medicaments and biological substances, initial encounter: Secondary | ICD-10-CM | POA: Diagnosis not present

## 2018-06-21 DIAGNOSIS — M818 Other osteoporosis without current pathological fracture: Secondary | ICD-10-CM | POA: Diagnosis not present

## 2018-06-21 DIAGNOSIS — E2749 Other adrenocortical insufficiency: Secondary | ICD-10-CM | POA: Diagnosis not present

## 2018-06-21 DIAGNOSIS — R7989 Other specified abnormal findings of blood chemistry: Secondary | ICD-10-CM | POA: Diagnosis not present

## 2018-06-21 DIAGNOSIS — Z9181 History of falling: Secondary | ICD-10-CM | POA: Diagnosis not present

## 2018-06-21 DIAGNOSIS — Z5181 Encounter for therapeutic drug level monitoring: Secondary | ICD-10-CM | POA: Diagnosis not present

## 2018-06-21 DIAGNOSIS — Z9221 Personal history of antineoplastic chemotherapy: Secondary | ICD-10-CM | POA: Diagnosis not present

## 2018-06-21 DIAGNOSIS — Z7952 Long term (current) use of systemic steroids: Secondary | ICD-10-CM | POA: Diagnosis not present

## 2018-06-21 DIAGNOSIS — Z923 Personal history of irradiation: Secondary | ICD-10-CM | POA: Diagnosis not present

## 2018-06-21 DIAGNOSIS — Z85841 Personal history of malignant neoplasm of brain: Secondary | ICD-10-CM | POA: Diagnosis not present

## 2018-06-21 DIAGNOSIS — T50905D Adverse effect of unspecified drugs, medicaments and biological substances, subsequent encounter: Secondary | ICD-10-CM | POA: Diagnosis not present

## 2018-06-21 DIAGNOSIS — C716 Malignant neoplasm of cerebellum: Secondary | ICD-10-CM | POA: Diagnosis not present

## 2018-06-21 DIAGNOSIS — Z8781 Personal history of (healed) traumatic fracture: Secondary | ICD-10-CM | POA: Diagnosis not present

## 2018-06-21 DIAGNOSIS — F17200 Nicotine dependence, unspecified, uncomplicated: Secondary | ICD-10-CM | POA: Diagnosis not present

## 2018-07-02 ENCOUNTER — Other Ambulatory Visit: Payer: Self-pay | Admitting: "Endocrinology

## 2018-07-12 ENCOUNTER — Other Ambulatory Visit: Payer: Self-pay | Admitting: "Endocrinology

## 2018-08-17 ENCOUNTER — Other Ambulatory Visit: Payer: Self-pay | Admitting: "Endocrinology

## 2018-08-17 DIAGNOSIS — E038 Other specified hypothyroidism: Secondary | ICD-10-CM

## 2018-08-21 ENCOUNTER — Telehealth: Payer: Self-pay

## 2018-08-21 ENCOUNTER — Other Ambulatory Visit: Payer: Self-pay | Admitting: "Endocrinology

## 2018-08-21 DIAGNOSIS — E291 Testicular hypofunction: Secondary | ICD-10-CM

## 2018-08-21 DIAGNOSIS — E038 Other specified hypothyroidism: Secondary | ICD-10-CM

## 2018-08-21 DIAGNOSIS — R5383 Other fatigue: Secondary | ICD-10-CM

## 2018-08-21 DIAGNOSIS — E039 Hypothyroidism, unspecified: Secondary | ICD-10-CM

## 2018-08-21 MED ORDER — LEVOTHYROXINE SODIUM 88 MCG PO TABS: 88.0000 ug | ORAL_TABLET | Freq: Every day | ORAL | 0 refills | Status: DC

## 2018-08-21 NOTE — Telephone Encounter (Signed)
Pts mom has called requesting a refill on Levothyroxine. Last OV was Jan 2019. He has been in the hospital. Mom states she will have new labs done no later than next week. Appt scheduled.

## 2018-08-21 NOTE — Telephone Encounter (Signed)
Refill for 3 months. Thanks.

## 2018-09-04 ENCOUNTER — Other Ambulatory Visit: Payer: Self-pay | Admitting: "Endocrinology

## 2018-10-07 ENCOUNTER — Other Ambulatory Visit: Payer: Self-pay | Admitting: "Endocrinology

## 2018-10-26 DIAGNOSIS — R51 Headache: Secondary | ICD-10-CM | POA: Diagnosis not present

## 2018-10-26 DIAGNOSIS — Z982 Presence of cerebrospinal fluid drainage device: Secondary | ICD-10-CM | POA: Diagnosis not present

## 2018-10-26 DIAGNOSIS — R000 Tachycardia, unspecified: Secondary | ICD-10-CM | POA: Diagnosis not present

## 2018-10-26 DIAGNOSIS — G40909 Epilepsy, unspecified, not intractable, without status epilepticus: Secondary | ICD-10-CM | POA: Diagnosis not present

## 2018-10-26 DIAGNOSIS — Z85841 Personal history of malignant neoplasm of brain: Secondary | ICD-10-CM | POA: Diagnosis not present

## 2018-10-26 DIAGNOSIS — Z049 Encounter for examination and observation for unspecified reason: Secondary | ICD-10-CM | POA: Diagnosis not present

## 2018-10-26 DIAGNOSIS — R109 Unspecified abdominal pain: Secondary | ICD-10-CM | POA: Diagnosis not present

## 2018-10-26 DIAGNOSIS — R4781 Slurred speech: Secondary | ICD-10-CM | POA: Diagnosis not present

## 2018-10-26 DIAGNOSIS — R9431 Abnormal electrocardiogram [ECG] [EKG]: Secondary | ICD-10-CM | POA: Diagnosis not present

## 2018-10-26 DIAGNOSIS — R569 Unspecified convulsions: Secondary | ICD-10-CM | POA: Diagnosis not present

## 2018-10-26 DIAGNOSIS — M25512 Pain in left shoulder: Secondary | ICD-10-CM | POA: Diagnosis not present

## 2018-10-26 DIAGNOSIS — R1033 Periumbilical pain: Secondary | ICD-10-CM | POA: Diagnosis not present

## 2018-10-26 DIAGNOSIS — G939 Disorder of brain, unspecified: Secondary | ICD-10-CM | POA: Diagnosis not present

## 2018-10-27 DIAGNOSIS — R000 Tachycardia, unspecified: Secondary | ICD-10-CM | POA: Diagnosis not present

## 2018-10-27 DIAGNOSIS — R9431 Abnormal electrocardiogram [ECG] [EKG]: Secondary | ICD-10-CM | POA: Diagnosis not present

## 2018-11-14 DIAGNOSIS — R9089 Other abnormal findings on diagnostic imaging of central nervous system: Secondary | ICD-10-CM | POA: Diagnosis not present

## 2018-11-17 ENCOUNTER — Other Ambulatory Visit: Payer: Self-pay | Admitting: "Endocrinology

## 2018-11-17 DIAGNOSIS — E038 Other specified hypothyroidism: Secondary | ICD-10-CM

## 2018-11-18 NOTE — Telephone Encounter (Signed)
Last seen 12/11/17. Both mom and grandmother have been told to schedule a follow up appt. No appts scheduled

## 2018-11-21 ENCOUNTER — Other Ambulatory Visit: Payer: Self-pay | Admitting: "Endocrinology

## 2018-12-15 ENCOUNTER — Other Ambulatory Visit: Payer: Self-pay | Admitting: "Endocrinology

## 2019-01-25 ENCOUNTER — Other Ambulatory Visit: Payer: Self-pay | Admitting: "Endocrinology

## 2019-02-15 ENCOUNTER — Other Ambulatory Visit: Payer: Self-pay | Admitting: "Endocrinology

## 2019-02-15 DIAGNOSIS — E038 Other specified hypothyroidism: Secondary | ICD-10-CM

## 2019-02-21 ENCOUNTER — Other Ambulatory Visit: Payer: Self-pay | Admitting: "Endocrinology

## 2019-03-02 DIAGNOSIS — R509 Fever, unspecified: Secondary | ICD-10-CM | POA: Diagnosis not present

## 2019-03-02 DIAGNOSIS — Z7952 Long term (current) use of systemic steroids: Secondary | ICD-10-CM | POA: Diagnosis not present

## 2019-03-02 DIAGNOSIS — Z85841 Personal history of malignant neoplasm of brain: Secondary | ICD-10-CM | POA: Diagnosis not present

## 2019-03-02 DIAGNOSIS — E039 Hypothyroidism, unspecified: Secondary | ICD-10-CM | POA: Diagnosis present

## 2019-03-02 DIAGNOSIS — E2749 Other adrenocortical insufficiency: Secondary | ICD-10-CM | POA: Diagnosis present

## 2019-03-02 DIAGNOSIS — G009 Bacterial meningitis, unspecified: Secondary | ICD-10-CM | POA: Diagnosis not present

## 2019-03-02 DIAGNOSIS — G92 Toxic encephalopathy: Secondary | ICD-10-CM | POA: Diagnosis not present

## 2019-03-02 DIAGNOSIS — G8194 Hemiplegia, unspecified affecting left nondominant side: Secondary | ICD-10-CM | POA: Diagnosis not present

## 2019-03-02 DIAGNOSIS — G960 Cerebrospinal fluid leak: Secondary | ICD-10-CM | POA: Diagnosis not present

## 2019-03-02 DIAGNOSIS — R9431 Abnormal electrocardiogram [ECG] [EKG]: Secondary | ICD-10-CM | POA: Diagnosis not present

## 2019-03-02 DIAGNOSIS — C719 Malignant neoplasm of brain, unspecified: Secondary | ICD-10-CM | POA: Diagnosis not present

## 2019-03-02 DIAGNOSIS — Z79890 Hormone replacement therapy (postmenopausal): Secondary | ICD-10-CM | POA: Diagnosis not present

## 2019-03-02 DIAGNOSIS — Z8661 Personal history of infections of the central nervous system: Secondary | ICD-10-CM | POA: Diagnosis not present

## 2019-03-02 DIAGNOSIS — M25512 Pain in left shoulder: Secondary | ICD-10-CM | POA: Diagnosis not present

## 2019-03-02 DIAGNOSIS — Z79899 Other long term (current) drug therapy: Secondary | ICD-10-CM | POA: Diagnosis not present

## 2019-03-02 DIAGNOSIS — Z20828 Contact with and (suspected) exposure to other viral communicable diseases: Secondary | ICD-10-CM | POA: Diagnosis present

## 2019-03-02 DIAGNOSIS — J690 Pneumonitis due to inhalation of food and vomit: Secondary | ICD-10-CM | POA: Diagnosis present

## 2019-03-02 DIAGNOSIS — H905 Unspecified sensorineural hearing loss: Secondary | ICD-10-CM | POA: Diagnosis present

## 2019-03-02 DIAGNOSIS — G9389 Other specified disorders of brain: Secondary | ICD-10-CM | POA: Diagnosis present

## 2019-03-02 DIAGNOSIS — G911 Obstructive hydrocephalus: Secondary | ICD-10-CM | POA: Diagnosis not present

## 2019-03-02 DIAGNOSIS — Z9221 Personal history of antineoplastic chemotherapy: Secondary | ICD-10-CM | POA: Diagnosis not present

## 2019-03-02 DIAGNOSIS — R569 Unspecified convulsions: Secondary | ICD-10-CM | POA: Diagnosis not present

## 2019-03-02 DIAGNOSIS — E2740 Unspecified adrenocortical insufficiency: Secondary | ICD-10-CM | POA: Diagnosis not present

## 2019-03-02 DIAGNOSIS — Z8619 Personal history of other infectious and parasitic diseases: Secondary | ICD-10-CM | POA: Diagnosis not present

## 2019-03-02 DIAGNOSIS — G40901 Epilepsy, unspecified, not intractable, with status epilepticus: Secondary | ICD-10-CM | POA: Diagnosis not present

## 2019-03-02 DIAGNOSIS — J189 Pneumonia, unspecified organism: Secondary | ICD-10-CM | POA: Diagnosis not present

## 2019-03-02 DIAGNOSIS — Z781 Physical restraint status: Secondary | ICD-10-CM | POA: Diagnosis not present

## 2019-03-02 DIAGNOSIS — G919 Hydrocephalus, unspecified: Secondary | ICD-10-CM | POA: Diagnosis not present

## 2019-03-02 DIAGNOSIS — G9340 Encephalopathy, unspecified: Secondary | ICD-10-CM | POA: Diagnosis not present

## 2019-03-02 DIAGNOSIS — D649 Anemia, unspecified: Secondary | ICD-10-CM | POA: Diagnosis not present

## 2019-03-02 DIAGNOSIS — Z049 Encounter for examination and observation for unspecified reason: Secondary | ICD-10-CM | POA: Diagnosis not present

## 2019-03-02 DIAGNOSIS — E871 Hypo-osmolality and hyponatremia: Secondary | ICD-10-CM | POA: Diagnosis present

## 2019-03-02 DIAGNOSIS — Z833 Family history of diabetes mellitus: Secondary | ICD-10-CM | POA: Diagnosis not present

## 2019-03-02 DIAGNOSIS — F17200 Nicotine dependence, unspecified, uncomplicated: Secondary | ICD-10-CM | POA: Diagnosis not present

## 2019-03-02 DIAGNOSIS — Z982 Presence of cerebrospinal fluid drainage device: Secondary | ICD-10-CM | POA: Diagnosis not present

## 2019-03-02 DIAGNOSIS — F17210 Nicotine dependence, cigarettes, uncomplicated: Secondary | ICD-10-CM | POA: Diagnosis present

## 2019-03-02 DIAGNOSIS — I471 Supraventricular tachycardia: Secondary | ICD-10-CM | POA: Diagnosis not present

## 2019-03-02 DIAGNOSIS — Z923 Personal history of irradiation: Secondary | ICD-10-CM | POA: Diagnosis not present

## 2019-03-02 DIAGNOSIS — N189 Chronic kidney disease, unspecified: Secondary | ICD-10-CM | POA: Diagnosis present

## 2019-03-02 DIAGNOSIS — R000 Tachycardia, unspecified: Secondary | ICD-10-CM | POA: Diagnosis not present

## 2019-03-02 DIAGNOSIS — E878 Other disorders of electrolyte and fluid balance, not elsewhere classified: Secondary | ICD-10-CM | POA: Diagnosis not present

## 2019-03-02 DIAGNOSIS — Z85858 Personal history of malignant neoplasm of other endocrine glands: Secondary | ICD-10-CM | POA: Diagnosis not present

## 2019-03-02 DIAGNOSIS — R4182 Altered mental status, unspecified: Secondary | ICD-10-CM | POA: Diagnosis not present

## 2019-03-02 DIAGNOSIS — C716 Malignant neoplasm of cerebellum: Secondary | ICD-10-CM | POA: Diagnosis not present

## 2019-03-02 DIAGNOSIS — G4089 Other seizures: Secondary | ICD-10-CM | POA: Diagnosis not present

## 2019-03-03 DIAGNOSIS — G9340 Encephalopathy, unspecified: Secondary | ICD-10-CM | POA: Insufficient documentation

## 2019-03-03 DIAGNOSIS — G919 Hydrocephalus, unspecified: Secondary | ICD-10-CM | POA: Insufficient documentation

## 2019-03-03 DIAGNOSIS — Z982 Presence of cerebrospinal fluid drainage device: Secondary | ICD-10-CM | POA: Insufficient documentation

## 2019-03-03 HISTORY — DX: Encephalopathy, unspecified: G93.40

## 2019-03-05 ENCOUNTER — Other Ambulatory Visit: Payer: Self-pay | Admitting: *Deleted

## 2019-03-05 NOTE — Patient Outreach (Signed)
[facility]) Care Management  03/05/2019  James Hoffman 25-Nov-1995 [id number]   Patient screening from Tier 4 ACO list. Noted that patient currently admitted to inpatient hospital. Will not outreach due to inpatient stay. Also noted patient is affiliated with [employer], there is embedded CM at this practice. Will collaborate with CCM nurse at [facility] to confirm if they have patient and if so will refer patient over to CCM for follow up. If not will monitor for discharge and make outreach call to patient for screening. Royetta Crochet. Laymond Purser, MSN, RN, [employer] , [place] [phone]) Business Cell  [phone]) Toll Free Office

## 2019-03-10 DIAGNOSIS — G8192 Hemiplegia, unspecified affecting left dominant side: Secondary | ICD-10-CM | POA: Diagnosis not present

## 2019-03-10 DIAGNOSIS — Z8661 Personal history of infections of the central nervous system: Secondary | ICD-10-CM | POA: Diagnosis not present

## 2019-03-10 DIAGNOSIS — Z982 Presence of cerebrospinal fluid drainage device: Secondary | ICD-10-CM | POA: Diagnosis not present

## 2019-03-10 DIAGNOSIS — D649 Anemia, unspecified: Secondary | ICD-10-CM | POA: Diagnosis not present

## 2019-03-10 DIAGNOSIS — N189 Chronic kidney disease, unspecified: Secondary | ICD-10-CM | POA: Diagnosis not present

## 2019-03-10 DIAGNOSIS — R270 Ataxia, unspecified: Secondary | ICD-10-CM | POA: Diagnosis not present

## 2019-03-10 DIAGNOSIS — G911 Obstructive hydrocephalus: Secondary | ICD-10-CM | POA: Diagnosis not present

## 2019-03-10 DIAGNOSIS — G9340 Encephalopathy, unspecified: Secondary | ICD-10-CM | POA: Diagnosis not present

## 2019-03-10 DIAGNOSIS — E2749 Other adrenocortical insufficiency: Secondary | ICD-10-CM | POA: Diagnosis not present

## 2019-03-10 DIAGNOSIS — G40909 Epilepsy, unspecified, not intractable, without status epilepticus: Secondary | ICD-10-CM | POA: Diagnosis not present

## 2019-03-10 DIAGNOSIS — H905 Unspecified sensorineural hearing loss: Secondary | ICD-10-CM | POA: Diagnosis not present

## 2019-03-10 DIAGNOSIS — E039 Hypothyroidism, unspecified: Secondary | ICD-10-CM | POA: Diagnosis not present

## 2019-03-10 DIAGNOSIS — E871 Hypo-osmolality and hyponatremia: Secondary | ICD-10-CM | POA: Diagnosis not present

## 2019-03-10 DIAGNOSIS — F17210 Nicotine dependence, cigarettes, uncomplicated: Secondary | ICD-10-CM | POA: Diagnosis not present

## 2019-03-10 DIAGNOSIS — R1312 Dysphagia, oropharyngeal phase: Secondary | ICD-10-CM | POA: Diagnosis not present

## 2019-03-10 DIAGNOSIS — Z85841 Personal history of malignant neoplasm of brain: Secondary | ICD-10-CM | POA: Diagnosis not present

## 2019-03-13 ENCOUNTER — Other Ambulatory Visit: Payer: Self-pay | Admitting: "Endocrinology

## 2019-03-13 DIAGNOSIS — E038 Other specified hypothyroidism: Secondary | ICD-10-CM

## 2019-03-14 ENCOUNTER — Ambulatory Visit: Payer: Medicare Other | Admitting: Family Medicine

## 2019-03-14 ENCOUNTER — Encounter: Payer: Self-pay | Admitting: Family Medicine

## 2019-03-14 ENCOUNTER — Other Ambulatory Visit: Payer: Self-pay

## 2019-03-14 ENCOUNTER — Ambulatory Visit (INDEPENDENT_AMBULATORY_CARE_PROVIDER_SITE_OTHER): Payer: Medicare Other | Admitting: Family Medicine

## 2019-03-14 DIAGNOSIS — Z09 Encounter for follow-up examination after completed treatment for conditions other than malignant neoplasm: Secondary | ICD-10-CM

## 2019-03-14 DIAGNOSIS — G40909 Epilepsy, unspecified, not intractable, without status epilepticus: Secondary | ICD-10-CM | POA: Diagnosis not present

## 2019-03-14 DIAGNOSIS — G9340 Encephalopathy, unspecified: Secondary | ICD-10-CM | POA: Diagnosis not present

## 2019-03-14 DIAGNOSIS — R1312 Dysphagia, oropharyngeal phase: Secondary | ICD-10-CM | POA: Diagnosis not present

## 2019-03-14 DIAGNOSIS — R531 Weakness: Secondary | ICD-10-CM | POA: Diagnosis not present

## 2019-03-14 DIAGNOSIS — R270 Ataxia, unspecified: Secondary | ICD-10-CM | POA: Diagnosis not present

## 2019-03-14 DIAGNOSIS — G8192 Hemiplegia, unspecified affecting left dominant side: Secondary | ICD-10-CM | POA: Diagnosis not present

## 2019-03-14 DIAGNOSIS — Z85841 Personal history of malignant neoplasm of brain: Secondary | ICD-10-CM | POA: Diagnosis not present

## 2019-03-14 NOTE — Progress Notes (Signed)
Virtual Visit via telephone Note Due to COVID-19, visit is conducted virtually and was requested by patient. This visit type was conducted due to national recommendations for restrictions regarding the COVID-19 Pandemic (e.g. social distancing) in an effort to limit this patient's exposure and mitigate transmission in our community.  Due to her co-morbid illnesses, this patient is at least at moderate risk for complications without adequate follow up.  This format is felt to be most appropriate for this patient at this time.  All issues noted in this document were discussed and addressed.  A physical exam was not performed with this format.   I connected with KAMUELA MAGOS mother on 03/14/19 at 1455 by telephone and verified that I am speaking with the correct person using two identifiers. ARGUS CARAHER is currently located at home and family is currently with them during visit. The provider, Monia Pouch, FNP is located in their office at time of visit.  I discussed the limitations, risks, security and privacy concerns of performing an evaluation and management service by telephone and the availability of in person appointments. I also discussed with the patient that there may be a patient responsible charge related to this service. The patient expressed understanding and agreed to proceed.  Subjective:  Patient ID: James Hoffman, male    DOB: [birth year], 23 y.o.   MRN: [id number]  Chief Complaint:  Hospitalization Follow-up   HPI: James Hoffman is a 23 y.o. male presenting on 03/14/2019 for Hospitalization Follow-up   Spoke with pts mother. Pt was admitted to [facility] on 03-02-2019 and discharged on 03-07-2019 for fever, weakness, and confusion. Mother reports pt has been doing fairly well and improving daily since discharge. She did not want to bring him in today due to COVID-19. She states he did have PT this morning and did well. He does have generalized weakness. She states  he is slowly getting his speech back. States he does not have a fever or chills. No shortness of breath. He is unable to walk on his own, this has been ongoing for several weeks. States he follows up with the neurologist at [facility] next week.     Relevant past medical, surgical, family, and social history reviewed and updated as indicated.  Allergies and medications reviewed and updated.   Past Medical History:  Diagnosis Date  . Adrenal insufficiency ([place])   . 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 Hydrocephalus ([place])   . Osteoporosis   . Thyroid disease     Past Surgical History:  Procedure Laterality Date  . brain tumor removal  August 2012  . HIP SURGERY    . teeth removal      Social History   Socioeconomic History  . Marital status: Single    Spouse name: Not on file  . Number of children: Not on file  . Years of education: Not on file  . Highest education level: Not on file  Occupational History  . Not on file  Social Needs  . Financial resource strain: Not on file  . Food insecurity:    Worry: Not on file    Inability: Not on file  . Transportation needs:    Medical: Not on file    Non-medical: Not on file  Tobacco Use  . Smoking status: Current Every Day Smoker    Packs/day: 0.25    Types: Cigarettes    Start date: 11/25/2012  . Smokeless tobacco: Never Used  Substance and Sexual Activity  . Alcohol use: No  . Drug use: No  . Sexual activity: Not on file  Lifestyle  . Physical activity:    Days per week: Not on file    Minutes per session: Not on file  . Stress: Not on file  Relationships  . Social connections:    Talks on phone: Not on file    Gets together: Not on file    Attends religious service: Not on file    Active member of club or organization: Not on file    Attends meetings of clubs or organizations: Not on file    Relationship status: Not on file  . Intimate partner violence:    Fear of current or ex partner: Not on file     Emotionally abused: Not on file    Physically abused: Not on file    Forced sexual activity: Not on file  Other Topics Concern  . Not on file  Social History Narrative  . Not on file    Outpatient Encounter Medications as of 03/14/2019  Medication Sig  . levothyroxine (SYNTHROID) 88 MCG tablet TAKE 1 TABLET (88 MCG TOTAL) DAILY BEFORE BREAKFAST. *NOTE WILL NEED REFILLS FROM PRIMARY MD*  . predniSONE (DELTASONE) 10 MG tablet TAKE 1 TABLET (10 MG TOTAL) BY MOUTH DAILY WITH BREAKFAST.  Marland Kitchen predniSONE (DELTASONE) 5 MG tablet TAKE 2 TABLETS BY MOUTH DAILY WITH BREAKFAST  . predniSONE (DELTASONE) 5 MG tablet TAKE 2 TABLETS BY MOUTH DAILY WITH BREAKFAST  . Testosterone (ANDROGEL PUMP) 20.25 MG/ACT (1.62%) GEL One pump on each shoulder every morning   No facility-administered encounter medications on file as of 03/14/2019.     No Known Allergies  Review of Systems  Constitutional: Positive for fatigue. Negative for chills and fever.  Respiratory: Negative for cough and shortness of breath.   Cardiovascular: Negative for chest pain, palpitations and leg swelling.  Genitourinary: Negative for decreased urine volume.  Neurological: Positive for weakness (generalized).  Psychiatric/Behavioral: Positive for confusion.         Observations/Objective: No vital signs or physical exam, this was a telephone or virtual health encounter.  Pt alert and oriented, answers all questions appropriately, and able to speak in full sentences.    Assessment and Plan: Costantino was seen today for hospitalization follow-up.  Diagnoses and all orders for this visit:  Hospital discharge follow-up Weakness generalized Keep follow up with neurology. Report any new or worsening problems. Follow up in 2 weeks.     Follow Up Instructions: Return in about 2 weeks (around 03/28/2019), or if symptoms worsen or fail to improve.    I discussed the assessment and treatment plan with the patient. The patient was  provided an opportunity to ask questions and all were answered. The patient agreed with the plan and demonstrated an understanding of the instructions.   The patient was advised to call back or seek an in-person evaluation if the symptoms worsen or if the condition fails to improve as anticipated.  The above assessment and management plan was discussed with the patient. The patient verbalized understanding of and has agreed to the management plan. Patient is aware to call the clinic if symptoms persist or worsen. Patient is aware when to return to the clinic for a follow-up visit. Patient educated on when it is appropriate to go to the emergency department.    I provided 15 minutes of non-face-to-face time during this encounter. The call started at 1455. The call ended at  [place], FNP-C [place] [address] [place], [place] [address] [phone]

## 2019-03-18 DIAGNOSIS — G9340 Encephalopathy, unspecified: Secondary | ICD-10-CM | POA: Diagnosis not present

## 2019-03-18 DIAGNOSIS — Z85841 Personal history of malignant neoplasm of brain: Secondary | ICD-10-CM | POA: Diagnosis not present

## 2019-03-18 DIAGNOSIS — R1312 Dysphagia, oropharyngeal phase: Secondary | ICD-10-CM | POA: Diagnosis not present

## 2019-03-18 DIAGNOSIS — R270 Ataxia, unspecified: Secondary | ICD-10-CM | POA: Diagnosis not present

## 2019-03-18 DIAGNOSIS — G40909 Epilepsy, unspecified, not intractable, without status epilepticus: Secondary | ICD-10-CM | POA: Diagnosis not present

## 2019-03-18 DIAGNOSIS — G8192 Hemiplegia, unspecified affecting left dominant side: Secondary | ICD-10-CM | POA: Diagnosis not present

## 2019-03-20 DIAGNOSIS — G40909 Epilepsy, unspecified, not intractable, without status epilepticus: Secondary | ICD-10-CM | POA: Diagnosis not present

## 2019-03-20 DIAGNOSIS — Z85841 Personal history of malignant neoplasm of brain: Secondary | ICD-10-CM | POA: Diagnosis not present

## 2019-03-20 DIAGNOSIS — G8192 Hemiplegia, unspecified affecting left dominant side: Secondary | ICD-10-CM | POA: Diagnosis not present

## 2019-03-20 DIAGNOSIS — G9340 Encephalopathy, unspecified: Secondary | ICD-10-CM | POA: Diagnosis not present

## 2019-03-20 DIAGNOSIS — R270 Ataxia, unspecified: Secondary | ICD-10-CM | POA: Diagnosis not present

## 2019-03-20 DIAGNOSIS — R1312 Dysphagia, oropharyngeal phase: Secondary | ICD-10-CM | POA: Diagnosis not present

## 2019-03-25 DIAGNOSIS — Z85841 Personal history of malignant neoplasm of brain: Secondary | ICD-10-CM | POA: Diagnosis not present

## 2019-03-25 DIAGNOSIS — G9340 Encephalopathy, unspecified: Secondary | ICD-10-CM | POA: Diagnosis not present

## 2019-03-25 DIAGNOSIS — G40909 Epilepsy, unspecified, not intractable, without status epilepticus: Secondary | ICD-10-CM | POA: Diagnosis not present

## 2019-03-25 DIAGNOSIS — G8192 Hemiplegia, unspecified affecting left dominant side: Secondary | ICD-10-CM | POA: Diagnosis not present

## 2019-03-25 DIAGNOSIS — R1312 Dysphagia, oropharyngeal phase: Secondary | ICD-10-CM | POA: Diagnosis not present

## 2019-03-25 DIAGNOSIS — R270 Ataxia, unspecified: Secondary | ICD-10-CM | POA: Diagnosis not present

## 2019-03-27 DIAGNOSIS — G40909 Epilepsy, unspecified, not intractable, without status epilepticus: Secondary | ICD-10-CM | POA: Diagnosis not present

## 2019-03-27 DIAGNOSIS — G9340 Encephalopathy, unspecified: Secondary | ICD-10-CM | POA: Diagnosis not present

## 2019-03-27 DIAGNOSIS — R270 Ataxia, unspecified: Secondary | ICD-10-CM | POA: Diagnosis not present

## 2019-03-27 DIAGNOSIS — Z85841 Personal history of malignant neoplasm of brain: Secondary | ICD-10-CM | POA: Diagnosis not present

## 2019-03-27 DIAGNOSIS — R1312 Dysphagia, oropharyngeal phase: Secondary | ICD-10-CM | POA: Diagnosis not present

## 2019-03-27 DIAGNOSIS — G8192 Hemiplegia, unspecified affecting left dominant side: Secondary | ICD-10-CM | POA: Diagnosis not present

## 2019-04-02 DIAGNOSIS — G8192 Hemiplegia, unspecified affecting left dominant side: Secondary | ICD-10-CM | POA: Diagnosis not present

## 2019-04-02 DIAGNOSIS — G9340 Encephalopathy, unspecified: Secondary | ICD-10-CM | POA: Diagnosis not present

## 2019-04-02 DIAGNOSIS — Z85841 Personal history of malignant neoplasm of brain: Secondary | ICD-10-CM | POA: Diagnosis not present

## 2019-04-02 DIAGNOSIS — R270 Ataxia, unspecified: Secondary | ICD-10-CM | POA: Diagnosis not present

## 2019-04-02 DIAGNOSIS — R1312 Dysphagia, oropharyngeal phase: Secondary | ICD-10-CM | POA: Diagnosis not present

## 2019-04-02 DIAGNOSIS — G40909 Epilepsy, unspecified, not intractable, without status epilepticus: Secondary | ICD-10-CM | POA: Diagnosis not present

## 2019-04-03 DIAGNOSIS — R270 Ataxia, unspecified: Secondary | ICD-10-CM | POA: Diagnosis not present

## 2019-04-03 DIAGNOSIS — G8192 Hemiplegia, unspecified affecting left dominant side: Secondary | ICD-10-CM | POA: Diagnosis not present

## 2019-04-03 DIAGNOSIS — G40909 Epilepsy, unspecified, not intractable, without status epilepticus: Secondary | ICD-10-CM | POA: Diagnosis not present

## 2019-04-03 DIAGNOSIS — R1312 Dysphagia, oropharyngeal phase: Secondary | ICD-10-CM | POA: Diagnosis not present

## 2019-04-03 DIAGNOSIS — G9340 Encephalopathy, unspecified: Secondary | ICD-10-CM | POA: Diagnosis not present

## 2019-04-03 DIAGNOSIS — Z85841 Personal history of malignant neoplasm of brain: Secondary | ICD-10-CM | POA: Diagnosis not present

## 2019-04-22 ENCOUNTER — Other Ambulatory Visit: Payer: Self-pay | Admitting: "Endocrinology

## 2019-04-22 DIAGNOSIS — E038 Other specified hypothyroidism: Secondary | ICD-10-CM

## 2019-05-01 ENCOUNTER — Other Ambulatory Visit: Payer: Self-pay | Admitting: "Endocrinology

## 2019-05-01 DIAGNOSIS — E038 Other specified hypothyroidism: Secondary | ICD-10-CM

## 2019-05-07 ENCOUNTER — Other Ambulatory Visit: Payer: Self-pay | Admitting: Family Medicine

## 2019-05-07 NOTE — Telephone Encounter (Signed)
Mother aware that patient will need to get refill through Dr. Dorris Fetch.

## 2019-05-08 ENCOUNTER — Other Ambulatory Visit: Payer: Self-pay

## 2019-05-08 ENCOUNTER — Other Ambulatory Visit: Payer: Medicare Other

## 2019-05-09 ENCOUNTER — Other Ambulatory Visit: Payer: Self-pay

## 2019-05-09 ENCOUNTER — Other Ambulatory Visit: Payer: Medicare Other

## 2019-05-09 DIAGNOSIS — R5383 Other fatigue: Secondary | ICD-10-CM | POA: Diagnosis not present

## 2019-05-09 DIAGNOSIS — E039 Hypothyroidism, unspecified: Secondary | ICD-10-CM | POA: Diagnosis not present

## 2019-05-09 DIAGNOSIS — E291 Testicular hypofunction: Secondary | ICD-10-CM | POA: Diagnosis not present

## 2019-05-10 ENCOUNTER — Other Ambulatory Visit: Payer: Self-pay | Admitting: "Endocrinology

## 2019-05-10 DIAGNOSIS — E038 Other specified hypothyroidism: Secondary | ICD-10-CM

## 2019-05-12 LAB — CBC WITH DIFFERENTIAL/PLATELET
BASOS ABS: 0.1 10*3/uL (ref 0.0–0.2)
BASOS: 1 %
EOS (ABSOLUTE): 0.4 10*3/uL (ref 0.0–0.4)
Eos: 3 %
Hematocrit: 44.3 % (ref 37.5–51.0)
Hemoglobin: 15.4 g/dL (ref 13.0–17.7)
IMMATURE GRANS (ABS): 0.2 10*3/uL — AB (ref 0.0–0.1)
IMMATURE GRANULOCYTES: 1 %
LYMPHS ABS: 3.9 10*3/uL — AB (ref 0.7–3.1)
Lymphs: 27 %
MCH: 33 pg (ref 26.6–33.0)
MCHC: 34.8 g/dL (ref 31.5–35.7)
MCV: 95 fL (ref 79–97)
MONOCYTES: 10 %
MONOS ABS: 1.4 10*3/uL — AB (ref 0.1–0.9)
NEUTROS ABS: 8.6 10*3/uL — AB (ref 1.4–7.0)
Neutrophils: 58 %
Platelets: 329 10*3/uL (ref 150–450)
RBC: 4.66 x10E6/uL (ref 4.14–5.80)
RDW: 13.1 % (ref 11.6–15.4)
WBC: 14.6 10*3/uL — ABNORMAL HIGH (ref 3.4–10.8)

## 2019-05-12 LAB — COMPREHENSIVE METABOLIC PANEL
A/G RATIO: 2 (ref 1.2–2.2)
ALK PHOS: 96 IU/L (ref 39–117)
ALT: 53 IU/L — ABNORMAL HIGH (ref 0–44)
AST: 33 IU/L (ref 0–40)
Albumin: 4.2 g/dL (ref 4.1–5.2)
BUN / CREAT RATIO: 7 — AB (ref 9–20)
BUN: 6 mg/dL (ref 6–20)
Bilirubin Total: <0.2 mg/dL (ref 0.0–1.2)
CALCIUM: 9.7 mg/dL (ref 8.7–10.2)
CO2: 23 mmol/L (ref 20–29)
Chloride: 95 mmol/L — ABNORMAL LOW (ref 96–106)
Creatinine, Ser: 0.87 mg/dL (ref 0.76–1.27)
GFR calc Af Amer: 141 mL/min/{1.73_m2} (ref >59)
GFR calc non Af Amer: 122 mL/min/{1.73_m2} (ref >59)
Globulin, Total: 2.1 g/dL (ref 1.5–4.5)
Glucose: 76 mg/dL (ref 65–99)
POTASSIUM: 3.6 mmol/L (ref 3.5–5.2)
SODIUM: 137 mmol/L (ref 134–144)
Total Protein: 6.3 g/dL (ref 6.0–8.5)

## 2019-05-12 LAB — T4, FREE: Free T4: 0.54 ng/dL — ABNORMAL LOW (ref 0.82–1.77)

## 2019-05-12 LAB — TSH: TSH: 0.976 u[IU]/mL (ref 0.450–4.500)

## 2019-05-12 LAB — TESTOSTERONE: Testosterone: 16 ng/dL — ABNORMAL LOW (ref 264–916)

## 2019-05-31 DIAGNOSIS — R4182 Altered mental status, unspecified: Secondary | ICD-10-CM | POA: Diagnosis not present

## 2019-05-31 DIAGNOSIS — R51 Headache: Secondary | ICD-10-CM | POA: Diagnosis not present

## 2019-05-31 DIAGNOSIS — R569 Unspecified convulsions: Secondary | ICD-10-CM | POA: Diagnosis not present

## 2019-05-31 DIAGNOSIS — G40909 Epilepsy, unspecified, not intractable, without status epilepticus: Secondary | ICD-10-CM | POA: Diagnosis not present

## 2019-05-31 DIAGNOSIS — Z049 Encounter for examination and observation for unspecified reason: Secondary | ICD-10-CM | POA: Diagnosis not present

## 2019-05-31 DIAGNOSIS — Z982 Presence of cerebrospinal fluid drainage device: Secondary | ICD-10-CM | POA: Diagnosis not present

## 2019-06-03 DIAGNOSIS — R9431 Abnormal electrocardiogram [ECG] [EKG]: Secondary | ICD-10-CM | POA: Diagnosis not present

## 2019-06-04 ENCOUNTER — Encounter: Payer: Self-pay | Admitting: "Endocrinology

## 2019-06-04 ENCOUNTER — Other Ambulatory Visit: Payer: Self-pay

## 2019-06-04 ENCOUNTER — Ambulatory Visit (INDEPENDENT_AMBULATORY_CARE_PROVIDER_SITE_OTHER): Payer: Medicare Other | Admitting: "Endocrinology

## 2019-06-04 DIAGNOSIS — E291 Testicular hypofunction: Secondary | ICD-10-CM

## 2019-06-04 DIAGNOSIS — E2740 Unspecified adrenocortical insufficiency: Secondary | ICD-10-CM

## 2019-06-04 DIAGNOSIS — E038 Other specified hypothyroidism: Secondary | ICD-10-CM | POA: Diagnosis not present

## 2019-06-04 MED ORDER — LEVOTHYROXINE SODIUM 100 MCG PO TABS: 100.0000 ug | ORAL_TABLET | Freq: Every day | ORAL | 3 refills | Status: DC

## 2019-06-04 MED ORDER — PREDNISONE 10 MG PO TABS: 10.0000 mg | ORAL_TABLET | Freq: Every day | ORAL | 3 refills | Status: DC

## 2019-06-04 MED ORDER — TESTOSTERONE 20.25 MG/ACT (1.62%) TD GEL: TRANSDERMAL | 2 refills | Status: DC

## 2019-06-04 NOTE — Progress Notes (Signed)
06/04/2019                                Endocrinology Telehealth Visit Follow up Note -During COVID -19 Pandemic  I connected with James Hoffman on 06/04/2019   by telephone and verified that I am speaking with the correct person using two identifiers. James Hoffman, 02/25/1996. he has verbally consented to this visit. All issues noted in this document were discussed and addressed. The format was not optimal for physical exam   Subjective:    Patient ID: James Hoffman, male    DOB: July 23, 1996, PCP Rakes, Connye Burkitt, FNP    Past Medical History:  Diagnosis Date  . Adrenal insufficiency ([place])   . 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 Hydrocephalus ([place])   . Osteoporosis   . Thyroid disease    Past Surgical History:  Procedure Laterality Date  . brain tumor removal  August 2012  . HIP SURGERY    . teeth removal     Social History   Socioeconomic History  . Marital status: Single    Spouse name: Not on file  . Number of children: Not on file  . Years of education: Not on file  . Highest education level: Not on file  Occupational History  . Not on file  Social Needs  . Financial resource strain: Not on file  . Food insecurity    Worry: Not on file    Inability: Not on file  . Transportation needs    Medical: Not on file    Non-medical: Not on file  Tobacco Use  . Smoking status: Current Every Day Smoker    Packs/day: 0.25    Types: Cigarettes    Start date: 11/25/2012  . Smokeless tobacco: Never Used  Substance and Sexual Activity  . Alcohol use: No  . Drug use: No  . Sexual activity: Not on file  Lifestyle  . Physical activity    Days per week: Not on file    Minutes per session: Not on file  . Stress: Not on file  Relationships  . Social Herbalist on phone: Not on file    Gets together: Not on file    Attends religious service: Not on file    Active member of club or organization: Not on file    Attends meetings of clubs or  organizations: Not on file    Relationship status: Not on file  Other Topics Concern  . Not on file  Social History Narrative  . Not on file   Outpatient Encounter Medications as of 06/04/2019  Medication Sig  . levothyroxine (SYNTHROID) 100 MCG tablet Take 1 tablet (100 mcg total) by mouth daily before breakfast.  . predniSONE (DELTASONE) 10 MG tablet Take 1 tablet (10 mg total) by mouth daily with breakfast.  . Testosterone (ANDROGEL PUMP) 20.25 MG/ACT (1.62%) GEL One pump on each shoulder every morning  . [DISCONTINUED] levothyroxine (SYNTHROID) 88 MCG tablet TAKE 1 TABLET (88 MCG TOTAL) DAILY BEFORE BREAKFAST. *NOTE WILL NEED REFILLS FROM PRIMARY MD*  . [DISCONTINUED] predniSONE (DELTASONE) 10 MG tablet TAKE 1 TABLET (10 MG TOTAL) BY MOUTH DAILY WITH BREAKFAST.  . [DISCONTINUED] predniSONE (DELTASONE) 5 MG tablet TAKE 2 TABLETS BY MOUTH DAILY WITH BREAKFAST  . [DISCONTINUED] predniSONE (DELTASONE) 5 MG tablet TAKE 2 TABLETS BY MOUTH DAILY WITH BREAKFAST  . [DISCONTINUED] Testosterone (ANDROGEL  PUMP) 20.25 MG/ACT (1.62%) GEL One pump on each shoulder every morning   No facility-administered encounter medications on file as of 06/04/2019.    ALLERGIES: No Known Allergies VACCINATION STATUS: Immunization History  Administered Date(s) Administered  . DTaP 05/15/1996, 07/17/1996, 10/21/1996, 09/13/1998, 05/09/2001  . Hepatitis B 05/15/1996, 07/17/1996, 10/21/1996  . HiB (PRP-OMP) 05/21/1996, 07/17/1996, 10/21/1996, 09/13/1998  . IPV 05/15/1996, 07/17/1996, 10/21/1996, 05/09/2001  . Tdap 07/06/2008    HPI  23 year old male patient with medical history as above.  He was seen 2 years ago in consultation for adrenal insufficiency, hypogonadism, and hypothyroidism as a complication of remote past treatment for brain tumor- glioblastoma.    He remains on prednisone 10 mg p.o. daily, levothyroxine 88 mcg p.o. every morning.  However, he ran out of his Androgel.   - he missed his  appointments since December 2017.     Patient is back in the care of his mother, whom he is staying with currently.  However he is managing his medications himself.    -He has a long and complicated medical history. 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  -His surgery and treatment for medulloblastoma took place at [facility] in [place]. On subsequent years he was diagnosed with hypothyroidism, hypogonadism, and adrenal insufficiency. He is currently on prednisone 10 mg by mouth daily and levothyroxine 88 g by mouth every morning. - He has significant disequilibrium and on and off dizziness and lightheadedness.  He has both motor and sensory aphasia.  -Reportedly, he never grew any mustache nor beard and never had to shave. -He has never engaged in sexual activity per family's history.  - He was supposed to be on AndroGel 20.25 mg pump once every morning for profound secondary hypogonadism. However he ran out of this product several months, his previsit labs show total testosterone which is undetectable.  Review of Systems Limited as above.  Objective:    There were no vitals taken for this visit.  Wt Readings from Last 3 Encounters:  10/28/17 144 lb (65.3 kg)  10/13/17 144 lb (65.3 kg)  08/13/17 144 lb (65.3 kg)     Recent Results (from the past 2160 hour(s))  Testosterone     Status: Abnormal   Collection Time: 05/09/19  9:44 AM  Result Value Ref Range   Testosterone 16 (L) 264 - 916 ng/dL    Comment: Adult male reference interval is based on a population of healthy nonobese males (BMI <30) between 20 and 28 years old. [place], [place] [phone]. PMID: [id number].   TSH     Status: None   Collection Time: 05/09/19  9:44 AM  Result Value Ref Range   TSH 0.976 0.450 - 4.500 uIU/mL  T4, Free     Status: Abnormal   Collection Time: 05/09/19  9:44 AM  Result Value Ref Range   Free T4 0.54 (L)  0.82 - 1.77 ng/dL  Comprehensive metabolic panel     Status: Abnormal   Collection Time: 05/09/19  9:44 AM  Result Value Ref Range   Glucose 76 65 - 99 mg/dL   BUN 6 6 - 20 mg/dL   Creatinine, Ser 0.87 0.76 - 1.27 mg/dL   GFR calc non Af Amer 122 >59 mL/min/1.73   GFR calc Af Amer 141 >59 mL/min/1.73   BUN/Creatinine Ratio 7 (L) 9 - 20   Sodium 137 134 - 144 mmol/L   Potassium 3.6 3.5 - 5.2 mmol/L   Chloride 95 (  L) 96 - 106 mmol/L   CO2 23 20 - 29 mmol/L   Calcium 9.7 8.7 - 10.2 mg/dL   Total Protein 6.3 6.0 - 8.5 g/dL   Albumin 4.2 4.1 - 5.2 g/dL   Globulin, Total 2.1 1.5 - 4.5 g/dL   Albumin/Globulin Ratio 2.0 1.2 - 2.2   Bilirubin Total <0.2 0.0 - 1.2 mg/dL   Alkaline Phosphatase 96 39 - 117 IU/L   AST 33 0 - 40 IU/L   ALT 53 (H) 0 - 44 IU/L  CBC with Differential     Status: Abnormal   Collection Time: 05/09/19  9:44 AM  Result Value Ref Range   WBC 14.6 (H) 3.4 - 10.8 x10E3/uL   RBC 4.66 4.14 - 5.80 x10E6/uL   Hemoglobin 15.4 13.0 - 17.7 g/dL   Hematocrit 44.3 37.5 - 51.0 %   MCV 95 79 - 97 fL   MCH 33.0 26.6 - 33.0 pg   MCHC 34.8 31.5 - 35.7 g/dL   RDW 13.1 11.6 - 15.4 %   Platelets 329 150 - 450 x10E3/uL   Neutrophils 58 Not Estab. %   Lymphs 27 Not Estab. %   Monocytes 10 Not Estab. %   Eos 3 Not Estab. %   Basos 1 Not Estab. %   Neutrophils Absolute 8.6 (H) 1.4 - 7.0 x10E3/uL   Lymphocytes Absolute 3.9 (H) 0.7 - 3.1 x10E3/uL   Monocytes Absolute 1.4 (H) 0.1 - 0.9 x10E3/uL   EOS (ABSOLUTE) 0.4 0.0 - 0.4 x10E3/uL   Basophils Absolute 0.1 0.0 - 0.2 x10E3/uL   Immature Granulocytes 1 Not Estab. %   Immature Grans (Abs) 0.2 (H) 0.0 - 0.1 x10E3/uL    Comment: (An elevated percentage of Immature Granulocytes has not been found to be clinically significant as a sole clinical predictor of disease. Does NOT include bands or blast cells.  Pregnancy associated physiological leukocytosis may also show increased immature granulocytes without clinical significance.)        Assessment & Plan:   1. Adrenal insufficiency ([place]) - He has well settled diagnosis of secondary adrenal insufficiency likely as a result of surgery and radiation to his head associated with his history of medulloblastoma of the brainstem. The details of his cancer therapy are not available to review today. -He has been relatively better on full dose replacement with prednisone.  He is advised to continue prednisone 10 mg p.o. daily around 8 AM.    I advised him on sick day rules where he can double his prednisone until the stressors are resolved. -He will need for medical alert device to wear on his body depicting his history is specially of the need for steroids.   2.  hypothyroidism  -His previsit thyroid function tests are consistent with inadequate replacement.  He gained 20 pounds since 2018 which is a good development for him.  I discussed and increase his levothyroxine to 100 mcg p.o. daily before breakfast.     3. Profound hypogonadism: He has responded previously to AndroGel with improvement in his testosterone level to 520.  Since he ran out of his AndroGel, his testosterone dropped back to 16.  He wishes to be resumed on testosterone treatment.   - He will benefit from continuation of  testosterone replacement, this will hopefully help with the muscular deconditioning he is dealing with as well. -I discussed and resumed testosterone supplement.   Due to his poor social support, he would not be able to do injectable testosterone.  I refilled  his AndroGel 20.25mg  pump   on both shoulders to get a total of 40.5 mg testosterone  daily in the morning.    - I have instructed his  family to document how much liquids he drinks and how many times he visits the bathroom. This is a patient who is  at-risk of diabetes insipidus.  - I advised patient to maintain close follow up with Rakes, Connye Burkitt, FNP for primary care needs. -I have extensively counseled him against smoking.      Time for this visit: 15 minutes. James Hoffman  participated in the discussions, expressed understanding, and voiced agreement with the above plans.  All questions were answered to his satisfaction. he is encouraged to contact clinic should he have any questions or concerns prior to his return visit.   Follow up plan: Return in about 4 months (around 10/05/2019), or fasting before 8AM, for Follow up with Pre-visit Labs.  Glade Lloyd, MD Phone: [phone]  Fax: [phone]  -  This note was partially dictated with voice recognition software. Similar sounding words can be transcribed inadequately or may not  be corrected upon review.  06/04/2019, 2:12 PM

## 2019-06-05 ENCOUNTER — Other Ambulatory Visit: Payer: Self-pay | Admitting: "Endocrinology

## 2019-06-05 DIAGNOSIS — E038 Other specified hypothyroidism: Secondary | ICD-10-CM

## 2019-06-05 MED ORDER — LEVOTHYROXINE SODIUM 100 MCG PO TABS: 100.0000 ug | ORAL_TABLET | Freq: Every day | ORAL | 3 refills | Status: DC

## 2019-06-12 ENCOUNTER — Telehealth: Payer: Self-pay | Admitting: Family Medicine

## 2019-06-23 NOTE — Telephone Encounter (Signed)
James Hoffman has been out of the office for a week, when she returns tomorrow, will contact me back about ppw

## 2019-06-27 NOTE — Telephone Encounter (Signed)
Paperwork received today, placed on providers desk

## 2019-09-19 ENCOUNTER — Telehealth: Payer: Self-pay | Admitting: Family Medicine

## 2019-09-19 NOTE — Telephone Encounter (Signed)
Not on hippa can not speak with

## 2019-09-23 ENCOUNTER — Telehealth: Payer: Self-pay | Admitting: Family Medicine

## 2019-09-23 NOTE — Chronic Care Management (AMB) (Signed)
°  Chronic Care Management   Outreach Note  09/23/2019 Name: James Hoffman MRN: [id number] DOB: 10-24-1996  Referred by: Baruch Gouty, FNP Reason for referral : Chronic Care Management (Initial CCM outreach was unsuccessful. )   An unsuccessful telephone outreach was attempted today. The patient was referred to the case management team by for assistance with care management and care coordination.   Follow Up Plan: A HIPPA compliant phone message was left for the patient providing contact information and requesting a return call.  The care management team will reach out to the patient again over the next 7 days.  If patient returns call to provider office, please advise to call [facility] [place], [place], [place] [address] Direct Dial: [email or web address]  Website: [email or web address]

## 2019-09-29 ENCOUNTER — Other Ambulatory Visit: Payer: Self-pay | Admitting: "Endocrinology

## 2019-09-29 DIAGNOSIS — E039 Hypothyroidism, unspecified: Secondary | ICD-10-CM

## 2019-09-29 DIAGNOSIS — E291 Testicular hypofunction: Secondary | ICD-10-CM

## 2019-09-29 DIAGNOSIS — E2740 Unspecified adrenocortical insufficiency: Secondary | ICD-10-CM

## 2019-09-29 NOTE — Chronic Care Management (AMB) (Signed)
°  Chronic Care Management   Outreach Note  09/29/2019 Name: James Hoffman MRN: [id number] DOB: Feb 17, 1996  Referred by: Baruch Gouty, FNP Reason for referral : Chronic Care Management (Initial CCM outreach was unsuccessful. ) and Chronic Care Management (Second CCM outreach was unsuccessful. )   A second unsuccessful telephone outreach was attempted today. The patient was referred to the case management team for assistance with care management and care coordination.   Follow Up Plan: The care management team will reach out to the patient again over the next 7 days.   [place], [place] [address] Direct Dial: [email or web address]  Website: [email or web address]

## 2019-09-30 ENCOUNTER — Other Ambulatory Visit: Payer: Self-pay

## 2019-09-30 ENCOUNTER — Other Ambulatory Visit: Payer: Medicare Other

## 2019-09-30 DIAGNOSIS — E039 Hypothyroidism, unspecified: Secondary | ICD-10-CM

## 2019-09-30 DIAGNOSIS — E291 Testicular hypofunction: Secondary | ICD-10-CM | POA: Diagnosis not present

## 2019-09-30 DIAGNOSIS — E2740 Unspecified adrenocortical insufficiency: Secondary | ICD-10-CM | POA: Diagnosis not present

## 2019-10-01 LAB — COMPREHENSIVE METABOLIC PANEL
ALT: 41 IU/L (ref 0–44)
AST: 25 IU/L (ref 0–40)
Albumin/Globulin Ratio: 2.1 (ref 1.2–2.2)
Albumin: 4.5 g/dL (ref 4.1–5.2)
Alkaline Phosphatase: 110 IU/L (ref 39–117)
BUN/Creatinine Ratio: 8 — ABNORMAL LOW (ref 9–20)
BUN: 7 mg/dL (ref 6–20)
Bilirubin Total: <0.2 mg/dL (ref 0.0–1.2)
CO2: 21 mmol/L (ref 20–29)
Calcium: 9.8 mg/dL (ref 8.7–10.2)
Chloride: 94 mmol/L — ABNORMAL LOW (ref 96–106)
Creatinine, Ser: 0.91 mg/dL (ref 0.76–1.27)
GFR calc Af Amer: 137 mL/min/{1.73_m2} (ref >59)
GFR calc non Af Amer: 118 mL/min/{1.73_m2} (ref >59)
Globulin, Total: 2.1 g/dL (ref 1.5–4.5)
Glucose: 80 mg/dL (ref 65–99)
Potassium: 3.8 mmol/L (ref 3.5–5.2)
Sodium: 138 mmol/L (ref 134–144)
Total Protein: 6.6 g/dL (ref 6.0–8.5)

## 2019-10-01 LAB — TESTOSTERONE,FREE AND TOTAL
Testosterone, Free: <0.2 pg/mL — ABNORMAL LOW (ref 9.3–26.5)
Testosterone: 85 ng/dL — ABNORMAL LOW (ref 264–916)

## 2019-10-01 LAB — TSH: TSH: 0.144 u[IU]/mL — ABNORMAL LOW (ref 0.450–4.500)

## 2019-10-01 LAB — T4, FREE: Free T4: 1.33 ng/dL (ref 0.82–1.77)

## 2019-10-06 ENCOUNTER — Ambulatory Visit: Payer: Medicare Other | Admitting: "Endocrinology

## 2019-10-07 ENCOUNTER — Telehealth: Payer: Self-pay | Admitting: Family Medicine

## 2019-10-07 NOTE — Telephone Encounter (Signed)
Left detailed message for mother to call back and schedule a televisit

## 2019-10-07 NOTE — Telephone Encounter (Signed)
Can this be done through a virtual visit or does he need to come in?

## 2019-10-07 NOTE — Telephone Encounter (Signed)
Virtual visit ok

## 2019-10-08 ENCOUNTER — Ambulatory Visit (INDEPENDENT_AMBULATORY_CARE_PROVIDER_SITE_OTHER): Payer: Medicare Other | Admitting: "Endocrinology

## 2019-10-08 ENCOUNTER — Encounter: Payer: Self-pay | Admitting: "Endocrinology

## 2019-10-08 ENCOUNTER — Telehealth: Payer: Self-pay | Admitting: Family Medicine

## 2019-10-08 VITALS — BP 118/61 | HR 74

## 2019-10-08 DIAGNOSIS — E039 Hypothyroidism, unspecified: Secondary | ICD-10-CM

## 2019-10-08 DIAGNOSIS — E2740 Unspecified adrenocortical insufficiency: Secondary | ICD-10-CM | POA: Diagnosis not present

## 2019-10-08 DIAGNOSIS — E038 Other specified hypothyroidism: Secondary | ICD-10-CM

## 2019-10-08 DIAGNOSIS — E291 Testicular hypofunction: Secondary | ICD-10-CM

## 2019-10-08 MED ORDER — LEVOTHYROXINE SODIUM 100 MCG PO TABS: 100.0000 ug | ORAL_TABLET | Freq: Every day | ORAL | 3 refills | Status: DC

## 2019-10-08 MED ORDER — TESTOSTERONE 20.25 MG/ACT (1.62%) TD GEL: TRANSDERMAL | 2 refills | Status: DC

## 2019-10-08 MED ORDER — PREDNISONE 10 MG PO TABS: 10.0000 mg | ORAL_TABLET | Freq: Every day | ORAL | 3 refills | Status: DC

## 2019-10-08 NOTE — Progress Notes (Signed)
10/08/2019                                Endocrinology Telehealth Visit Follow up Note -During COVID -19 Pandemic  I connected with James Hoffman on 10/08/2019   by telephone and verified that I am speaking with the correct person using two identifiers. James Hoffman, 04-Sep-1996. he has verbally consented to this visit. All issues noted in this document were discussed and addressed. The format was not optimal for physical exam   Subjective:    Patient ID: James Hoffman, male    DOB: 1996/04/29, PCP James Norlander, DO    Past Medical History:  Diagnosis Date  . Adrenal insufficiency ([place])   . Cancer ([place])    brain tumor on brain stem  . Hydrocephalus ([place])   . Osteoporosis   . Thyroid disease    Past Surgical History:  Procedure Laterality Date  . brain tumor removal  August 2012  . HIP SURGERY    . teeth removal     Social History   Socioeconomic History  . Marital status: Single    Spouse name: Not on file  . Number of children: Not on file  . Years of education: Not on file  . Highest education level: Not on file  Occupational History  . Not on file  Social Needs  . Financial resource strain: Not on file  . Food insecurity    Worry: Not on file    Inability: Not on file  . Transportation needs    Medical: Not on file    Non-medical: Not on file  Tobacco Use  . Smoking status: Current Every Day Smoker    Packs/day: 0.25    Types: Cigarettes    Start date: 11/25/2012  . Smokeless tobacco: Never Used  Substance and Sexual Activity  . Alcohol use: No  . Drug use: No  . Sexual activity: Not on file  Lifestyle  . Physical activity    Days per week: Not on file    Minutes per session: Not on file  . Stress: Not on file  Relationships  . Social Herbalist on phone: Not on file    Gets together: Not on file    Attends religious service: Not on file    Active member of club or organization: Not on file    Attends meetings of clubs  or organizations: Not on file    Relationship status: Not on file  Other Topics Concern  . Not on file  Social History Narrative  . Not on file   Outpatient Encounter Medications as of 10/08/2019  Medication Sig  . calcium citrate-vitamin D (CITRACAL+D) 315-200 MG-UNIT tablet Take by mouth daily.  [place] Kitchen levETIRAcetam (KEPPRA) 1000 MG tablet 2 (two) times daily.  [place] Kitchen levothyroxine (SYNTHROID) 100 MCG tablet Take 1 tablet (100 mcg total) by mouth daily before breakfast.  . predniSONE (DELTASONE) 10 MG tablet Take 1 tablet (10 mg total) by mouth daily with breakfast.  . Testosterone (ANDROGEL PUMP) 20.25 MG/ACT (1.62%) GEL One pump on each shoulder every morning  . [DISCONTINUED] levothyroxine (SYNTHROID) 100 MCG tablet Take 1 tablet (100 mcg total) by mouth daily.  . [DISCONTINUED] levothyroxine (SYNTHROID) 100 MCG tablet Take 1 tablet (100 mcg total) by mouth daily before breakfast.  . [DISCONTINUED] predniSONE (DELTASONE) 10 MG tablet Take 1 tablet (10 mg total) by mouth daily with breakfast.  . [  DISCONTINUED] Testosterone (ANDROGEL PUMP) 20.25 MG/ACT (1.62%) GEL One pump on each shoulder every morning   No facility-administered encounter medications on file as of 10/08/2019.    ALLERGIES: No Known Allergies VACCINATION STATUS: Immunization History  Administered Date(s) Administered  . DTaP 05/15/1996, 07/17/1996, 10/21/1996, 09/13/1998, 05/09/2001  . Hepatitis B 05/15/1996, 07/17/1996, 10/21/1996  . HiB (PRP-OMP) 05/21/1996, 07/17/1996, 10/21/1996, 09/13/1998  . IPV 05/15/1996, 07/17/1996, 10/21/1996, 05/09/2001  . Tdap 07/06/2008    HPI  23 year old male patient with medical history as above.  He is here for follow-up related to his adrenal insufficiency, hypogonadism, hypothyroidism which developed as a complication of remote past treatment for brain tumor- glioblastoma.    He remains on prednisone 10 mg p.o. daily, levothyroxine 100 mcg p.o. every morning.  However, he has not been  consistent in using his AndroGel.  He is now under the care of his aunt James Hoffman was trying to coordinate his care.   He is in the middle of switching to a different primary care provider as well.  However he is largely managing his medications himself.    -He has a long and complicated medical history. 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  -His surgery and treatment for medulloblastoma took place at [facility] in [place]. On subsequent years he was diagnosed with hypothyroidism, hypogonadism, and adrenal insufficiency.   He is currently on prednisone 10 mg by mouth daily and levothyroxine 100 g by mouth every morning. - He has significant disequilibrium and on and off dizziness and lightheadedness, which made him mostly wheelchair and recliner dependent. He has both motor and sensory aphasia.  -Reportedly, he never grew any mustache nor beard and never had to shave. -He has never engaged in sexual activity per family's history.  - He was supposed to be on AndroGel 20.25 mg pump x2 every morning for profound secondary hypogonadism.  His total testosterone has only slightly improved to 85 from 16.   Review of Systems Limited as above.  Objective:    BP 118/61   Pulse 74   Wt Readings from Last 3 Encounters:  10/28/17 144 lb (65.3 kg)  10/13/17 144 lb (65.3 kg)  08/13/17 144 lb (65.3 kg)     Recent Results (from the past 2160 hour(s))  T4, Free     Status: None   Collection Time: 09/30/19  8:51 AM  Result Value Ref Range   Free T4 1.33 0.82 - 1.77 ng/dL  TSH     Status: Abnormal   Collection Time: 09/30/19  8:51 AM  Result Value Ref Range   TSH 0.144 (L) 0.450 - 4.500 uIU/mL  Testosterone,Free and Total     Status: Abnormal   Collection Time: 09/30/19  8:51 AM  Result Value Ref Range   Testosterone 85 (L) 264 - 916 ng/dL    Comment: Adult male reference interval is based on a population of healthy  nonobese males (BMI <30) between 79 and 21 years old. [place], [place] [phone]. PMID: [id number].    Testosterone, Free <0.2 (L) 9.3 - 26.5 pg/mL  Comprehensive metabolic panel     Status: Abnormal   Collection Time: 09/30/19  8:51 AM  Result Value Ref Range   Glucose 80 65 - 99 mg/dL   BUN 7 6 - 20 mg/dL   Creatinine, Ser 0.91 0.76 - 1.27 mg/dL   GFR calc non Af Amer 118 >59 mL/min/1.73   GFR calc Af Amer 137 >59 mL/min/1.73  BUN/Creatinine Ratio 8 (L) 9 - 20   Sodium 138 134 - 144 mmol/L   Potassium 3.8 3.5 - 5.2 mmol/L   Chloride 94 (L) 96 - 106 mmol/L   CO2 21 20 - 29 mmol/L   Calcium 9.8 8.7 - 10.2 mg/dL   Total Protein 6.6 6.0 - 8.5 g/dL   Albumin 4.5 4.1 - 5.2 g/dL   Globulin, Total 2.1 1.5 - 4.5 g/dL   Albumin/Globulin Ratio 2.1 1.2 - 2.2   Bilirubin Total <0.2 0.0 - 1.2 mg/dL   Alkaline Phosphatase 110 39 - 117 IU/L   AST 25 0 - 40 IU/L   ALT 41 0 - 44 IU/L      Assessment & Plan:   1. Adrenal insufficiency ([place]) - He has well settled diagnosis of secondary adrenal insufficiency likely as a result of surgery and radiation to his head associated with his history of medulloblastoma of the brainstem. The details of his cancer therapy are not available to review today. -He has been relatively better on full dose replacement with prednisone.  He and his aunt James Hoffman are advised to be consistent and continue prednisone 10 mg p.o. daily around 8 AM.   -I revised the sick day rules where he can double his prednisone until the stressors are resolved. -He will need for medical alert device to wear on his body depicting his history is specially of the need for steroids.   2.  hypothyroidism  -His previsit thyroid function tests are improving and consistent with appropriate replacement.  He is advised to continue levothyroxine 100 mcg p.o. daily before breakfast.     - We discussed about the correct intake of his thyroid hormone, on empty stomach at fasting,  with water, separated by at least 30 minutes from breakfast and other medications,  and separated by more than 4 hours from calcium, iron, multivitamins, acid reflux medications (PPIs). -Patient is made aware of the fact that thyroid hormone replacement is needed for life, dose to be adjusted by periodic monitoring of thyroid function tests.   3. Profound hypogonadism: He has responded previously to AndroGel with improvement in his testosterone level to 520.  His current problem is consistently issues.  He is managing his AndroGel pretty much himself.  I advised him to resume AndroGel 20.25 mg on both shoulders every day before 8 AM.  This will give him a total of 40.5 mg testosterone daily with plan to repeat labs before his next visit in 4 months.   - He will benefit from continuation of  testosterone replacement, this will hopefully help with the muscular deconditioning he is dealing with as well.   - I have instructed his  family to document how much liquids he drinks and how many times he visits the bathroom. This is a patient who is  at-risk of diabetes insipidus.  - I advised patient to maintain close follow up with James Norlander, DO for primary care needs.  Reportedly, he will need a referral to a new neurologist given his history of seizures.  He has appointment to see his PMD in the next several days.  -I have extensively counseled him against smoking.    Time for this visit: 15 minutes. James Hoffman  participated in the discussions, expressed understanding, and voiced agreement with the above plans.  All questions were answered to his satisfaction. he is encouraged to contact clinic should he have any questions or concerns prior to his return visit.  Follow up  plan: Return in about 6 months (around 04/06/2020), or Fasting B4 8AM, for Follow up with Pre-visit Labs.  Glade Lloyd, MD Phone: [phone]  Fax: [phone]  -  This note was partially dictated with voice  recognition software. Similar sounding words can be transcribed inadequately or may not  be corrected upon review.  10/08/2019, 3:52 PM

## 2019-10-14 ENCOUNTER — Telehealth: Payer: Self-pay | Admitting: Family Medicine

## 2019-10-14 ENCOUNTER — Telehealth: Payer: Self-pay | Admitting: "Endocrinology

## 2019-10-14 NOTE — Telephone Encounter (Signed)
No , we will make changes based on his next labs.

## 2019-10-14 NOTE — Telephone Encounter (Signed)
Pts aunt, [name]) is questioning should his medication dosage be increased on the testosterone or dosage lowered on the Levothyroxine due to his labs being ABN.

## 2019-10-14 NOTE — Telephone Encounter (Signed)
Was calling about DR Nida labs - aware to call them

## 2019-10-14 NOTE — Telephone Encounter (Signed)
Pt's aunt, [name] said that Dr Dorris Fetch told her that Advaith's levels are not where he wants them but did not actually give her the levels. She wants you to call her

## 2019-10-15 ENCOUNTER — Ambulatory Visit (INDEPENDENT_AMBULATORY_CARE_PROVIDER_SITE_OTHER): Payer: Medicare Other | Admitting: Family Medicine

## 2019-10-15 DIAGNOSIS — R29898 Other symptoms and signs involving the musculoskeletal system: Secondary | ICD-10-CM | POA: Diagnosis not present

## 2019-10-15 DIAGNOSIS — R2681 Unsteadiness on feet: Secondary | ICD-10-CM

## 2019-10-15 DIAGNOSIS — M818 Other osteoporosis without current pathological fracture: Secondary | ICD-10-CM | POA: Diagnosis not present

## 2019-10-15 DIAGNOSIS — G40909 Epilepsy, unspecified, not intractable, without status epilepticus: Secondary | ICD-10-CM | POA: Diagnosis not present

## 2019-10-15 DIAGNOSIS — H9193 Unspecified hearing loss, bilateral: Secondary | ICD-10-CM

## 2019-10-15 MED ORDER — LEVETIRACETAM 1000 MG PO TABS: 1000.0000 mg | ORAL_TABLET | Freq: Two times a day (BID) | ORAL | 0 refills | Status: DC

## 2019-10-15 NOTE — Progress Notes (Signed)
Telephone visit  Subjective: CC: est care, f/u seizure disorder PCP: [name], DO [id number] D Melillo is a 23 y.o. male calls for telephone consult today. Patient provides verbal consent for consult held via phone.  Location of patient: home Location of provider: [facility] Others present for call: aunt, [name]  1. Seizure disorder Much of the history is provided by his aunt, [name].  She notes that she most recently started taking care of her nephew again and is coordinating care for him now.  He has a history of seizure disorder which onset many years ago.  He was being treated with Keppra 1000 mg twice daily but unfortunately after he was hospitalized for quite some time recently, he missed several neurologic appointments at [facility].  He has since been dismissed from that clinic and she is asking for [place] to place a new referral today for him to reestablish care.  He has a couple of days of the Keppra left but she most certainly is needing refills on this medicine as he will likely not be able to be seen until after the new year.  She goes on to state that he has been seizure-free for several months now.  He was seen in the emergency department in July for seizure activity.  2.  Debility/osteoporosis She notes that he is quite disabled.  She tries to get him up and walking but she worries about him fracturing ankles.  He has a history of osteoporosis was previously treated and monitored by orthopedics.  She is unsure as the name of the orthopedist but knows that it was on [address] across from [facility] in [place].  She would like to have a new referral placed.  She will most certainly need referral to physical therapy at some point but will come in for a face-to-face visit in a couple of weeks to get home health put in place.  3.  Hearing disorder She notes that he has quite a bit of difficulty hearing.  Seems to be a bilateral issue.  She  would like to have him see an audiologist for screening and possible prescription of hearing aids.   ROS: Per HPI  No Known Allergies Past Medical History:  Diagnosis Date  . Adrenal insufficiency ([place])   . Cancer ([place])    brain tumor on brain stem  . Hydrocephalus ([place])   . Osteoporosis   . Thyroid disease     Current Outpatient Medications:  .  calcium citrate-vitamin D (CITRACAL+D) 315-200 MG-UNIT tablet, Take by mouth daily., Disp: , Rfl:  .  levETIRAcetam (KEPPRA) 1000 MG tablet, 2 (two) times daily., Disp: , Rfl:  .  levothyroxine (SYNTHROID) 100 MCG tablet, Take 1 tablet (100 mcg total) by mouth daily before breakfast., Disp: 95 tablet, Rfl: 3 .  predniSONE (DELTASONE) 10 MG tablet, Take 1 tablet (10 mg total) by mouth daily with breakfast., Disp: 95 tablet, Rfl: 3 .  Testosterone (ANDROGEL PUMP) 20.25 MG/ACT (1.62%) GEL, One pump on each shoulder every morning, Disp: 75 g, Rfl: 2  Assessment/ Plan: 23 y.o. male   1. Seizure disorder [facility]) I reviewed the chart and it appears that he indeed did have seizure-like activity back in July.  I have renewed his Keppra at 1000 mg twice daily.  I placed a referral to neurology at [facility] medical per her request. - Ambulatory referral to Neurology - levETIRAcetam (KEPPRA) 1000 MG tablet; Take 1 tablet (1,000 mg total) by  mouth 2 (two) times daily.  Dispense: 60 tablet; Refill: 0  2. Hearing problem of both ears Referral placed to audiology. - Ambulatory referral to Audiology  3. Gait instability We will likely need home health physical therapy we will get this arranged after face-to-face visit in a couple of weeks.  In the interim, will place referral to orthopedics as per her request.  May also benefit from OT evaluation given her report of his state at home - Ambulatory referral to Orthopedic Surgery  4. Muscular deconditioning - Ambulatory referral to Orthopedic Surgery  5. Other osteoporosis without current  pathological fracture - Ambulatory referral to Orthopedic Surgery   Start time: 2:55pm (lost connection); called back 3:00pm End time: 3:09am  Total time spent on patient care (including telephone call/ virtual visit): 20 minutes  [place], [place] [phone]

## 2019-10-15 NOTE — Telephone Encounter (Signed)
Pt's caregiver notified.

## 2019-10-16 ENCOUNTER — Ambulatory Visit (INDEPENDENT_AMBULATORY_CARE_PROVIDER_SITE_OTHER): Payer: Medicare Other

## 2019-10-16 ENCOUNTER — Encounter: Payer: Self-pay | Admitting: Nurse Practitioner

## 2019-10-16 ENCOUNTER — Other Ambulatory Visit: Payer: Self-pay

## 2019-10-16 ENCOUNTER — Ambulatory Visit: Payer: Medicare Other | Admitting: Nurse Practitioner

## 2019-10-16 ENCOUNTER — Ambulatory Visit: Payer: Medicare Other | Admitting: Family Medicine

## 2019-10-16 ENCOUNTER — Ambulatory Visit (INDEPENDENT_AMBULATORY_CARE_PROVIDER_SITE_OTHER): Payer: Medicare Other | Admitting: Nurse Practitioner

## 2019-10-16 VITALS — BP 124/90 | HR 96 | Temp 97.3°F | Resp 20

## 2019-10-16 DIAGNOSIS — M25552 Pain in left hip: Secondary | ICD-10-CM

## 2019-10-16 DIAGNOSIS — M25512 Pain in left shoulder: Secondary | ICD-10-CM

## 2019-10-16 MED ORDER — TRAMADOL HCL 50 MG PO TABS: 50.0000 mg | ORAL_TABLET | Freq: Three times a day (TID) | ORAL | 0 refills | Status: AC | PRN

## 2019-10-16 MED ORDER — NAPROXEN 500 MG PO TABS: 500.0000 mg | ORAL_TABLET | Freq: Two times a day (BID) | ORAL | 1 refills | Status: DC

## 2019-10-16 NOTE — Progress Notes (Signed)
Subjective:    Patient ID: James Hoffman, male    DOB: Dec 06, 1995, 23 y.o.   MRN: [id number]   Chief Complaint: left hip and knee pain [place] 2 months ago)   HPI Patient brought in by his AUNT. She says that he fell 2 months ago and injured his left hip and left knee. He broke his left hip in the past and has not been walking well since. His pain is worse when he stands. He denies any pain with movement of hip or knee.    Review of Systems  Constitutional: Negative for activity change and appetite change.  HENT: Negative.   Eyes: Negative for pain.  Respiratory: Negative for shortness of breath.   Cardiovascular: Negative for chest pain, palpitations and leg swelling.  Gastrointestinal: Negative for abdominal pain.  Endocrine: Negative for polydipsia.  Genitourinary: Negative.   Musculoskeletal: Positive for arthralgias (left hip and knee).  Skin: Negative for rash.  Neurological: Negative for dizziness, weakness and headaches.  Hematological: Does not bruise/bleed easily.  Psychiatric/Behavioral: Negative.   All other systems reviewed and are negative.      Objective:   Physical Exam Vitals signs and nursing note reviewed.  Constitutional:      Appearance: Normal appearance. He is well-developed.  HENT:     Head: Normocephalic.     Nose: Nose normal.  Eyes:     Pupils: Pupils are equal, round, and reactive to light.  Neck:     Musculoskeletal: Normal range of motion and neck supple.     Thyroid: No thyroid mass or thyromegaly.     Vascular: No carotid bruit or JVD.     Trachea: Phonation normal.  Cardiovascular:     Rate and Rhythm: Normal rate and regular rhythm.  Pulmonary:     Effort: Pulmonary effort is normal. No respiratory distress.     Breath sounds: Normal breath sounds.  Abdominal:     General: Bowel sounds are normal.     Palpations: Abdomen is soft.     Tenderness: There is no abdominal tenderness.  Musculoskeletal:     Comments: FROM of left  knee without pain- no effusion pain on left hip with abduction and external rotation Decrease ROM of left shoulder with pain on internal rotation and abduction  Lymphadenopathy:     Cervical: No cervical adenopathy.  Skin:    General: Skin is warm and dry.  Neurological:     Mental Status: He is alert and oriented to person, place, and time.  Psychiatric:        Behavior: Behavior normal.        Thought Content: Thought content normal.        Judgment: Judgment normal.     BP 124/90   Pulse 96   Temp (!) 97.3 F (36.3 C) (Temporal)   Resp 20   SpO2 98%   Left shoulder xray- no fracture- mild arthritis-Preliminary reading by Ronnald Collum, FNP  [facility]  Left hip - old healed fracture of left femur with rod intact-Preliminary reading by Ronnald Collum, FNP  [facility]      Assessment & Plan:  James Hoffman in today with chief complaint of left hip and knee pain (Fell 2 months ago)   1. Acute pain of left shoulder  - DG Shoulder Left; Future  2. Left hip pain - DG HIP UNILAT WITH PELVIS 2-3 VIEWS LEFT; Future  Meds ordered this encounter  Medications  . naproxen (NAPROSYN) 500 MG tablet  Sig: Take 1 tablet (500 mg total) by mouth 2 (two) times daily with a meal.    Dispense:  60 tablet    Refill:  1    Order Specific Question:   Supervising Provider    Answer:   Caryl Pina A [id number]  . traMADol (ULTRAM) 50 MG tablet    Sig: Take 1 tablet (50 mg total) by mouth every 8 (eight) hours as needed for up to 5 days.    Dispense:  15 tablet    Refill:  0    Order Specific Question:   Supervising Provider    Answer:   Worthy Rancher [phone]   Referral already made to ortho  [place], FNP

## 2019-10-21 ENCOUNTER — Other Ambulatory Visit: Payer: Self-pay | Admitting: Family Medicine

## 2019-10-21 NOTE — Telephone Encounter (Signed)
What is the name of the medication? tramadol  Have you contacted your pharmacy to request a refill? no  Which pharmacy would you like this sent to? [facility]. Pt needs another referral. Ortho apt yesterday recommended pt second evaluation. Please call back   Patient notified that their request is being sent to the clinical staff for review and that they should receive a call once it is complete. If they do not receive a call within 24 hours they can check with their pharmacy or our office.

## 2019-10-21 NOTE — Telephone Encounter (Signed)
[facility] said he would need to go to specialist at [facility] but he is in a lot of pain and needs refill on Tramadol. Advised that pt would ntbs due to office policy/protocol and Tramadol being a pain medication and she wanted him seen this afternoon or tomorrow. Advised we didn't have any openings with PCP this afternoon and to call first thing at 8 in the morning as his PCP is acute provider.

## 2019-10-21 NOTE — Chronic Care Management (AMB) (Signed)
°  Chronic Care Management   Outreach Note  10/21/2019 Name: James Hoffman MRN: [id number] DOB: Aug 23, 1996  Referred by: [name], DO Reason for referral : Chronic Care Management (Initial CCM outreach was unsuccessful. ), Chronic Care Management (Second CCM outreach was unsuccessful. ), and Chronic Care Management (Third CM outreach was unsuccessful.)   Third unsuccessful telephone outreach was attempted today. The patient was referred to the case management team for assistance with care management and care coordination. The patient's primary care provider has been notified of our unsuccessful attempts to make or maintain contact with the patient. The care management team is pleased to engage with this patient at any time in the future should he/she be interested in assistance from the care management team.   Follow Up Plan: The care management team is available to follow up with the patient after provider conversation with the patient regarding recommendation for care management engagement and subsequent re-referral to the care management team.   [place], [place] Management  [place], [place] [address] Direct Dial: [email or web address]  Website: [email or web address]

## 2019-10-22 ENCOUNTER — Other Ambulatory Visit: Payer: Self-pay

## 2019-10-22 ENCOUNTER — Ambulatory Visit (INDEPENDENT_AMBULATORY_CARE_PROVIDER_SITE_OTHER): Payer: Medicare Other | Admitting: Family Medicine

## 2019-10-22 DIAGNOSIS — H9193 Unspecified hearing loss, bilateral: Secondary | ICD-10-CM

## 2019-10-22 DIAGNOSIS — M25512 Pain in left shoulder: Secondary | ICD-10-CM | POA: Diagnosis not present

## 2019-10-22 MED ORDER — ACETAMINOPHEN-CODEINE #3 300-30 MG PO TABS: 1.0000 | ORAL_TABLET | Freq: Four times a day (QID) | ORAL | 0 refills | Status: AC | PRN

## 2019-10-22 NOTE — Progress Notes (Signed)
Telephone visit  Subjective: CC: f/u shoulder pain PCP: [name], DO [id number] D Ellena is a 23 y.o. male calls for telephone consult today. Patient provides verbal consent for consult held via phone.  Location of patient: home Location of provider: [facility] Others present for call: aunt, [name]  1.  Left shoulder pain Patient was seen in the office on 10/16/2019 for acute pain of the left shoulder.  He has since been evaluated by orthopedics who does report that he has such degenerative changes in that shoulder he will need a joint replacement.  He is subsequently been referred to the orthopedist at [facility] medical for evaluation.  He is currently being treated with Naprosyn.  He has chronic prednisone for adrenal insufficiency.  He was also prescribed tramadol.  Of note, patient with seizure disorder on Keppra.  He has had no breakthrough seizures with the tramadol.  He does have ongoing pain in his oncologist to see if there are any medications that can be prescribed.   ROS: Per HPI  No Known Allergies Past Medical History:  Diagnosis Date  . Adrenal insufficiency ([place])   . Cancer ([place])    brain tumor on brain stem  . Hydrocephalus ([place])   . Osteoporosis   . Thyroid disease     Current Outpatient Medications:  .  calcium citrate-vitamin D (CITRACAL+D) 315-200 MG-UNIT tablet, Take by mouth daily., Disp: , Rfl:  .  levETIRAcetam (KEPPRA) 1000 MG tablet, Take 1 tablet (1,000 mg total) by mouth 2 (two) times daily., Disp: 60 tablet, Rfl: 0 .  levothyroxine (SYNTHROID) 100 MCG tablet, Take 1 tablet (100 mcg total) by mouth daily before breakfast., Disp: 95 tablet, Rfl: 3 .  naproxen (NAPROSYN) 500 MG tablet, Take 1 tablet (500 mg total) by mouth 2 (two) times daily with a meal., Disp: 60 tablet, Rfl: 1 .  predniSONE (DELTASONE) 10 MG tablet, Take 1 tablet (10 mg total) by mouth daily with breakfast., Disp: 95 tablet, Rfl: 3 .  Testosterone (ANDROGEL  PUMP) 20.25 MG/ACT (1.62%) GEL, One pump on each shoulder every morning, Disp: 75 g, Rfl: 2  Assessment/ Plan: 23 y.o. male   1. Acute pain of left shoulder Recently seen in the office and prescribed Naprosyn and tramadol.  Unfortunately, tramadol is contraindicated in seizure disorder and I have recommended discontinue use of the medicine.  I have instead replaced with Tylenol with codeine.  We discussed using this with a full meal.  Caution sedation.  We discussed the addictive nature of this medicine and that this is not intended for long-term use.  I agree with referral to orthopedics and she will be seeing the one at [facility] soon.  This patient will likely need joint replacement.  If ongoing need for chronic opioid, may need to consider referral to pain specialist. The Narcotic Database has been reviewed.  There were no red flags.   - acetaminophen-codeine (TYLENOL #3) 300-30 MG tablet; Take 1 tablet by mouth every 6 (six) hours as needed for up to 5 days for severe pain.  Dispense: 20 tablet; Refill: 0  2. Hearing problem of both ears New referral placed to [place] per his caregivers request. - Ambulatory referral to Audiology   Start time: 2:39pm End time: 2:51pm  Total time spent on patient care (including telephone call/ virtual visit): 15 minutes  [place], [place] [phone]

## 2019-10-27 ENCOUNTER — Telehealth: Payer: Self-pay | Admitting: Family Medicine

## 2019-10-28 ENCOUNTER — Other Ambulatory Visit: Payer: Self-pay

## 2019-10-29 ENCOUNTER — Encounter: Payer: Self-pay | Admitting: Family Medicine

## 2019-10-29 ENCOUNTER — Ambulatory Visit (INDEPENDENT_AMBULATORY_CARE_PROVIDER_SITE_OTHER): Payer: Medicare Other | Admitting: Family Medicine

## 2019-10-29 VITALS — BP 139/88 | HR 97 | Temp 96.8°F | Ht 70.0 in | Wt 157.0 lb

## 2019-10-29 DIAGNOSIS — Z9889 Other specified postprocedural states: Secondary | ICD-10-CM

## 2019-10-29 DIAGNOSIS — H5410 Blindness, one eye, low vision other eye, unspecified eyes: Secondary | ICD-10-CM

## 2019-10-29 DIAGNOSIS — Z7409 Other reduced mobility: Secondary | ICD-10-CM

## 2019-10-29 DIAGNOSIS — G40909 Epilepsy, unspecified, not intractable, without status epilepticus: Secondary | ICD-10-CM

## 2019-10-29 DIAGNOSIS — R2681 Unsteadiness on feet: Secondary | ICD-10-CM

## 2019-10-29 DIAGNOSIS — Z23 Encounter for immunization: Secondary | ICD-10-CM | POA: Diagnosis not present

## 2019-10-29 DIAGNOSIS — Z87898 Personal history of other specified conditions: Secondary | ICD-10-CM | POA: Diagnosis not present

## 2019-10-29 DIAGNOSIS — R7989 Other specified abnormal findings of blood chemistry: Secondary | ICD-10-CM

## 2019-10-29 DIAGNOSIS — D72829 Elevated white blood cell count, unspecified: Secondary | ICD-10-CM

## 2019-10-29 DIAGNOSIS — R29898 Other symptoms and signs involving the musculoskeletal system: Secondary | ICD-10-CM

## 2019-10-29 MED ORDER — LEVETIRACETAM 1000 MG PO TABS: 1000.0000 mg | ORAL_TABLET | Freq: Two times a day (BID) | ORAL | 2 refills | Status: DC
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
## 2019-10-29 NOTE — Patient Instructions (Addendum)
You had labs performed today.  You will be contacted with the results of the labs once they are available, usually in the next 3 business days for routine lab work.  If you have an active my chart account, they will be released to your MyChart.  If you prefer to have these labs released to you via telephone, please let us know.  If you had a pap smear or biopsy performed, expect to be contacted in about 7-10 days.   Remember to take the Synthroid (levothyroxine) on an empty stomach with water only at least 30 minutes away from other food or medication.

## 2019-10-30 LAB — CBC
Hematocrit: 45.5 % (ref 37.5–51.0)
Hemoglobin: 15.6 g/dL (ref 13.0–17.7)
MCH: 32.6 pg (ref 26.6–33.0)
MCHC: 34.3 g/dL (ref 31.5–35.7)
MCV: 95 fL (ref 79–97)
Platelets: 338 10*3/uL (ref 150–450)
RBC: 4.79 x10E6/uL (ref 4.14–5.80)
RDW: 12.8 % (ref 11.6–15.4)
WBC: 15.2 10*3/uL — ABNORMAL HIGH (ref 3.4–10.8)

## 2019-10-30 LAB — THYROID PANEL WITH TSH
Free Thyroxine Index: 1.8 (ref 1.2–4.9)
T3 Uptake Ratio: 30 % (ref 24–39)
T4, Total: 5.9 ug/dL (ref 4.5–12.0)
TSH: 0.066 u[IU]/mL — ABNORMAL LOW (ref 0.450–4.500)

## 2019-10-31 ENCOUNTER — Telehealth: Payer: Self-pay | Admitting: Family Medicine

## 2019-10-31 DIAGNOSIS — M79605 Pain in left leg: Secondary | ICD-10-CM | POA: Insufficient documentation

## 2019-10-31 DIAGNOSIS — Z87898 Personal history of other specified conditions: Secondary | ICD-10-CM

## 2019-10-31 DIAGNOSIS — M1612 Unilateral primary osteoarthritis, left hip: Secondary | ICD-10-CM | POA: Insufficient documentation

## 2019-10-31 NOTE — Telephone Encounter (Signed)
lmtcb

## 2019-10-31 NOTE — Telephone Encounter (Signed)
Patient's aunt, Judie Petit, is aware of lab results.  She reports that he does not see an oncologist/hematologist.

## 2019-10-31 NOTE — Telephone Encounter (Signed)
So he has history of brain cancer but doesn't have a cancer doctor anymore?  I can refer to [facility] if needed.

## 2019-11-03 ENCOUNTER — Telehealth: Payer: Self-pay | Admitting: Family Medicine

## 2019-11-03 NOTE — Telephone Encounter (Signed)
I placed referral for Dr. Lajuana Ripple because she is out this week.

## 2019-11-03 NOTE — Addendum Note (Signed)
Addended by: Caryl Pina on: 11/03/2019 04:08 PM   Modules accepted: Orders

## 2019-11-03 NOTE — Telephone Encounter (Signed)
Aunt called back - states he does not have an Oncologist that she is aware of.  States he has been back with his grandma for the last three months.  States that his mom was not taking the patient to his appointments.  Patient has a new patient appointment with a Neurologist in Feb since he got dismissed for NS appointments.  Aunt states that if Dr.G feels he needs a Oncologist then please place a referral for one in [place] you are out of the office this week and states she would like to wait until you return next week.  Please advise

## 2019-11-03 NOTE — Telephone Encounter (Signed)
Refer to previous telephone call- this encounter will be closed.

## 2019-11-03 NOTE — Telephone Encounter (Signed)
Lmtcb 12/21

## 2019-11-03 NOTE — Telephone Encounter (Signed)
Faxed labs to [facility].

## 2019-11-04 ENCOUNTER — Telehealth: Payer: Self-pay | Admitting: Family Medicine

## 2019-11-04 NOTE — Telephone Encounter (Signed)
Patient's Ref has been sent to [facility] and is scheduled on 11/21/2019 with Dr. Shaaron Adler, [facility] for [place] and also mailed letter with appointment information.

## 2019-11-18 ENCOUNTER — Encounter: Payer: Self-pay | Admitting: Family Medicine

## 2019-11-18 ENCOUNTER — Other Ambulatory Visit: Payer: Self-pay

## 2019-11-18 ENCOUNTER — Ambulatory Visit (INDEPENDENT_AMBULATORY_CARE_PROVIDER_SITE_OTHER): Payer: Medicare Other | Admitting: Family Medicine

## 2019-11-18 DIAGNOSIS — R3989 Other symptoms and signs involving the genitourinary system: Secondary | ICD-10-CM

## 2019-11-18 NOTE — Progress Notes (Signed)
Telephone visit  Subjective: CC:? UTI PCP: [name], DO [id number] D Vazzana is a 24 y.o. male calls for telephone consult today. Patient provides verbal consent for consult held via phone.  Due to COVID-19 pandemic this visit was conducted virtually. This visit type was conducted due to national recommendations for restrictions regarding the COVID-19 Pandemic (e.g. social distancing, sheltering in place) in an effort to limit this patient's exposure and mitigate transmission in our community. All issues noted in this document were discussed and addressed.  A physical exam was not performed with this format.   Location of patient: home Location of provider: Working remotely from home Others present for call: [name]  1. Possible UTI She reports onset of urinary urgency and strong urine odor that has been worse over the last week.  He has been having incontinent spells.  He reports mild dysuria.  He predominantly drinks sodas and essentially drinks no water.  She does not report any hematuria, fevers, nausea or vomiting.  ROS: Per HPI  No Known Allergies Past Medical History:  Diagnosis Date  . Adrenal insufficiency ([place])   . 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 Hydrocephalus ([place])   . Osteoporosis   . Thyroid disease     Current Outpatient Medications:  .  acetaminophen-codeine (TYLENOL #3) 300-30 MG tablet, Take 1 tablet by mouth every 6 (six) hours as needed for moderate pain., Disp: , Rfl:  .  calcium citrate-vitamin D (CITRACAL+D) 315-200 MG-UNIT tablet, Take by mouth daily., Disp: , Rfl:  .  levETIRAcetam (KEPPRA) 1000 MG tablet, Take 1 tablet (1,000 mg total) by mouth 2 (two) times daily., Disp: 60 tablet, Rfl: 2 .  levothyroxine (SYNTHROID) 100 MCG tablet, Take 1 tablet (100 mcg total) by mouth daily before breakfast., Disp: 95 tablet, Rfl: 3 .  naproxen (NAPROSYN) 500 MG tablet, Take 1 tablet (500 mg total) by mouth 2 (two) times daily with a  meal., Disp: 60 tablet, Rfl: 1 .  predniSONE (DELTASONE) 10 MG tablet, Take 1 tablet (10 mg total) by mouth daily with breakfast., Disp: 95 tablet, Rfl: 3 .  Testosterone (ANDROGEL PUMP) 20.25 MG/ACT (1.62%) GEL, One pump on each shoulder every morning, Disp: 75 g, Rfl: 2  Assessment/ Plan: 24 y.o. male   1. Suspected UTI His caregiver will come in tomorrow with the patient to provide a urine sample.  We will plan antibiotics pending this though clinically he sounds like he does have a urinary tract infection.  They do not have transportation to get an antibiotic tonight so she would rather just wait until urine sample is provided tomorrow before sending anything.  I have encouraged him to increase his water consumption.  If anything worsens he is to seek immediate medical attention emergency department. - urinalysis- dip and micro; Future - Urine culture; Future   Start time: 3:47pm End time: 3:55pm  Total time spent on patient care (including telephone call/ virtual visit): 12 minutes  [place], [place] [phone]

## 2019-11-18 NOTE — Patient Instructions (Signed)
Urinary Tract Infection, Adult A urinary tract infection (UTI) is an infection of any part of the urinary tract. The urinary tract includes:  The kidneys.  The ureters.  The bladder.  The urethra. These organs make, store, and get rid of pee (urine) in the body. What are the causes? This is caused by germs (bacteria) in your genital area. These germs grow and cause swelling (inflammation) of your urinary tract. What increases the risk? You are more likely to develop this condition if:  You have a small, thin tube (catheter) to drain pee.  You cannot control when you pee or poop (incontinence).  You are male, and: ? You use these methods to prevent pregnancy:  A medicine that kills sperm (spermicide).  A device that blocks sperm (diaphragm). ? You have low levels of a male hormone (estrogen). ? You are pregnant.  You have genes that add to your risk.  You are sexually active.  You take antibiotic medicines.  You have trouble peeing because of: ? A prostate that is bigger than normal, if you are male. ? A blockage in the part of your body that drains pee from the bladder (urethra). ? A kidney stone. ? A nerve condition that affects your bladder (neurogenic bladder). ? Not getting enough to drink. ? Not peeing often enough.  You have other conditions, such as: ? Diabetes. ? A weak disease-fighting system (immune system). ? Sickle cell disease. ? Gout. ? Injury of the spine. What are the signs or symptoms? Symptoms of this condition include:  Needing to pee right away (urgently).  Peeing often.  Peeing small amounts often.  Pain or burning when peeing.  Blood in the pee.  Pee that smells bad or not like normal.  Trouble peeing.  Pee that is cloudy.  Fluid coming from the vagina, if you are male.  Pain in the belly or lower back. Other symptoms include:  Throwing up (vomiting).  No urge to eat.  Feeling mixed up (confused).  Being tired  and grouchy (irritable).  A fever.  Watery poop (diarrhea). How is this treated? This condition may be treated with:  Antibiotic medicine.  Other medicines.  Drinking enough water. Follow these instructions at home:  Medicines  Take over-the-counter and prescription medicines only as told by your doctor.  If you were prescribed an antibiotic medicine, take it as told by your doctor. Do not stop taking it even if you start to feel better. General instructions  Make sure you: ? Pee until your bladder is empty. ? Do not hold pee for a long time. ? Empty your bladder after sex. ? Wipe from front to back after pooping if you are a male. Use each tissue one time when you wipe.  Drink enough fluid to keep your pee pale yellow.  Keep all follow-up visits as told by your doctor. This is important. Contact a doctor if:  You do not get better after 1-2 days.  Your symptoms go away and then come back. Get help right away if:  You have very bad back pain.  You have very bad pain in your lower belly.  You have a fever.  You are sick to your stomach (nauseous).  You are throwing up. Summary  A urinary tract infection (UTI) is an infection of any part of the urinary tract.  This condition is caused by germs in your genital area.  There are many risk factors for a UTI. These include having a small, thin   tube to drain pee and not being able to control when you pee or poop.  Treatment includes antibiotic medicines for germs.  Drink enough fluid to keep your pee pale yellow. This information is not intended to replace advice given to you by your health care provider. Make sure you discuss any questions you have with your health care provider. Document Revised: 10/17/2018 Document Reviewed: 05/09/2018 Elsevier Patient Education  2020 Elsevier Inc.  

## 2019-11-19 ENCOUNTER — Other Ambulatory Visit: Payer: Self-pay

## 2019-11-19 ENCOUNTER — Other Ambulatory Visit: Payer: Self-pay | Admitting: Family Medicine

## 2019-11-19 ENCOUNTER — Other Ambulatory Visit: Payer: Medicare Other

## 2019-11-19 DIAGNOSIS — R3989 Other symptoms and signs involving the genitourinary system: Secondary | ICD-10-CM

## 2019-11-19 DIAGNOSIS — N3000 Acute cystitis without hematuria: Secondary | ICD-10-CM

## 2019-11-19 LAB — MICROSCOPIC EXAMINATION
RBC, Urine: NONE SEEN /hpf (ref 0–2)
Renal Epithel, UA: NONE SEEN /hpf
WBC, UA: NONE SEEN /hpf (ref 0–5)

## 2019-11-19 LAB — URINALYSIS, COMPLETE
Bilirubin, UA: NEGATIVE
Glucose, UA: NEGATIVE
Ketones, UA: NEGATIVE
Leukocytes,UA: NEGATIVE
Nitrite, UA: NEGATIVE
Protein,UA: NEGATIVE
RBC, UA: NEGATIVE
Specific Gravity, UA: 1.01 (ref 1.005–1.030)
Urobilinogen, Ur: 0.2 mg/dL (ref 0.2–1.0)
pH, UA: 6 (ref 5.0–7.5)

## 2019-11-19 MED ORDER — CEPHALEXIN 500 MG PO CAPS: 500.0000 mg | ORAL_CAPSULE | Freq: Two times a day (BID) | ORAL | 0 refills | Status: AC

## 2019-11-21 LAB — URINE CULTURE

## 2019-11-26 DIAGNOSIS — M810 Age-related osteoporosis without current pathological fracture: Secondary | ICD-10-CM | POA: Diagnosis not present

## 2019-11-28 DIAGNOSIS — C716 Malignant neoplasm of cerebellum: Secondary | ICD-10-CM | POA: Diagnosis not present

## 2019-12-03 ENCOUNTER — Other Ambulatory Visit: Payer: Self-pay | Admitting: Family Medicine

## 2019-12-08 ENCOUNTER — Telehealth: Payer: Self-pay | Admitting: Family Medicine

## 2019-12-08 NOTE — Telephone Encounter (Signed)
Is this something we can add or do we have to do another visit?

## 2019-12-08 NOTE — Telephone Encounter (Signed)
Please advise 

## 2019-12-08 NOTE — Telephone Encounter (Signed)
There would have to be an office visit or video visit Appt made for 12/09/19

## 2019-12-09 ENCOUNTER — Telehealth (INDEPENDENT_AMBULATORY_CARE_PROVIDER_SITE_OTHER): Payer: Medicare Other | Admitting: Family Medicine

## 2019-12-09 DIAGNOSIS — R479 Unspecified speech disturbances: Secondary | ICD-10-CM | POA: Diagnosis not present

## 2019-12-09 DIAGNOSIS — C716 Malignant neoplasm of cerebellum: Secondary | ICD-10-CM | POA: Diagnosis not present

## 2019-12-09 DIAGNOSIS — Z87898 Personal history of other specified conditions: Secondary | ICD-10-CM | POA: Diagnosis not present

## 2019-12-09 NOTE — Progress Notes (Signed)
Telephone visit  Subjective: CC: speech PCP: [name], DO [id number] James Hoffman is a 24 y.o. male calls for telephone consult today. Patient provides verbal consent for consult held via phone.  Due to COVID-19 pandemic this visit was conducted virtually. This visit type was conducted due to national recommendations for restrictions regarding the COVID-19 Pandemic (e.g. social distancing, sheltering in place) in an effort to limit this patient's exposure and mitigate transmission in our community. All issues noted in this document were discussed and addressed.  A physical exam was not performed with this format.   Location of patient: home Location of provider: [facility] Others present for call: aunt  1. Speech difficulty His aunt notes that he has had difficulty with speech since his brain tumor was resected.  After resection he did work with speech therapy but unfortunately this was not continued.  She notes stuttered speech, difficulty forming words.  She wants to have him seen at the [facility] center in [place].  Apparently, her son goes there.  They see children typically but she was told they would make an exception and see him too since he "has the mentation of a child".    Additionally, she updates me and they have established care with the new oncologist.  She really enjoyed that visit and thought it was very productive.  Apparently, they are being referred to an endocrinologist at [facility] will be getting a brain MRI soon.  The oncologist also specializes in neurology, so he will be managing his seizure medications as well.   ROS: Per HPI  No Known Allergies Past Medical History:  Diagnosis Date  . Adrenal insufficiency ([place])   . 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 Hydrocephalus ([place])   . Osteoporosis   . Thyroid disease     Current Outpatient Medications:  .  acetaminophen-codeine (TYLENOL #3) 300-30 MG tablet, Take 1 tablet by mouth every 6 (six) hours  as needed for moderate pain., Disp: , Rfl:  .  calcium citrate-vitamin James (CITRACAL+James) 315-200 MG-UNIT tablet, Take by mouth daily., Disp: , Rfl:  .  levETIRAcetam (KEPPRA) 1000 MG tablet, Take 1 tablet (1,000 mg total) by mouth 2 (two) times daily., Disp: 60 tablet, Rfl: 2 .  levothyroxine (SYNTHROID) 100 MCG tablet, Take 1 tablet (100 mcg total) by mouth daily before breakfast., Disp: 95 tablet, Rfl: 3 .  naproxen (NAPROSYN) 500 MG tablet, TAKE 1 TABLET (500 MG TOTAL) BY MOUTH 2 (TWO) TIMES DAILY WITH A MEAL., Disp: 60 tablet, Rfl: 1 .  predniSONE (DELTASONE) 10 MG tablet, Take 1 tablet (10 mg total) by mouth daily with breakfast., Disp: 95 tablet, Rfl: 3 .  Testosterone (ANDROGEL PUMP) 20.25 MG/ACT (1.62%) GEL, One pump on each shoulder every morning, Disp: 75 g, Rfl: 2  Assessment/ Plan: 24 y.o. male   1. Difficulty with speech Referral placed. 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  Plans for MRI soon. - Ambulatory referral to Speech Therapy  3. History of brain tumor - Ambulatory referral to Speech Therapy   Start time: 3:17pm End time: 3:31pm  Total time spent on patient care (including telephone call/ virtual visit): 20 minutes  [place], [place] [phone]

## 2019-12-10 DIAGNOSIS — E291 Testicular hypofunction: Secondary | ICD-10-CM | POA: Diagnosis not present

## 2019-12-10 DIAGNOSIS — Z9181 History of falling: Secondary | ICD-10-CM | POA: Diagnosis not present

## 2019-12-10 DIAGNOSIS — E039 Hypothyroidism, unspecified: Secondary | ICD-10-CM | POA: Diagnosis not present

## 2019-12-10 DIAGNOSIS — Z923 Personal history of irradiation: Secondary | ICD-10-CM | POA: Diagnosis not present

## 2019-12-10 DIAGNOSIS — T50905A Adverse effect of unspecified drugs, medicaments and biological substances, initial encounter: Secondary | ICD-10-CM | POA: Diagnosis not present

## 2019-12-10 DIAGNOSIS — C801 Malignant (primary) neoplasm, unspecified: Secondary | ICD-10-CM | POA: Diagnosis not present

## 2019-12-10 DIAGNOSIS — Z7952 Long term (current) use of systemic steroids: Secondary | ICD-10-CM | POA: Diagnosis not present

## 2019-12-10 DIAGNOSIS — F17200 Nicotine dependence, unspecified, uncomplicated: Secondary | ICD-10-CM | POA: Diagnosis not present

## 2019-12-10 DIAGNOSIS — M818 Other osteoporosis without current pathological fracture: Secondary | ICD-10-CM | POA: Diagnosis not present

## 2019-12-10 DIAGNOSIS — Z9221 Personal history of antineoplastic chemotherapy: Secondary | ICD-10-CM | POA: Diagnosis not present

## 2019-12-10 DIAGNOSIS — E2749 Other adrenocortical insufficiency: Secondary | ICD-10-CM | POA: Diagnosis not present

## 2019-12-17 ENCOUNTER — Telehealth: Payer: Self-pay | Admitting: Family Medicine

## 2019-12-18 ENCOUNTER — Other Ambulatory Visit: Payer: Self-pay | Admitting: Family Medicine

## 2019-12-18 DIAGNOSIS — Z7409 Other reduced mobility: Secondary | ICD-10-CM

## 2019-12-18 DIAGNOSIS — R2681 Unsteadiness on feet: Secondary | ICD-10-CM

## 2019-12-18 DIAGNOSIS — R29898 Other symptoms and signs involving the musculoskeletal system: Secondary | ICD-10-CM

## 2019-12-18 NOTE — Telephone Encounter (Signed)
Lmtcb.

## 2019-12-18 NOTE — Telephone Encounter (Signed)
RX up front

## 2019-12-18 NOTE — Telephone Encounter (Signed)
Done/ given to Barnett Applebaum to fax to medical supply store of choice.

## 2019-12-19 ENCOUNTER — Ambulatory Visit: Payer: Medicare Other | Admitting: Family Medicine

## 2019-12-19 DIAGNOSIS — D72829 Elevated white blood cell count, unspecified: Secondary | ICD-10-CM | POA: Diagnosis not present

## 2019-12-19 DIAGNOSIS — C716 Malignant neoplasm of cerebellum: Secondary | ICD-10-CM | POA: Diagnosis not present

## 2019-12-19 DIAGNOSIS — F17210 Nicotine dependence, cigarettes, uncomplicated: Secondary | ICD-10-CM | POA: Diagnosis not present

## 2020-01-02 ENCOUNTER — Other Ambulatory Visit: Payer: Self-pay

## 2020-01-02 ENCOUNTER — Ambulatory Visit: Payer: Medicare Other | Admitting: Family Medicine

## 2020-01-02 NOTE — Progress Notes (Signed)
Telephone visit  No answer.  Attempted to reach several times.  Please have patient reschedule OV.  Start time: 2:30pm (LVM); 2:37pm (LVM); 3:13pm (went straight to VM)

## 2020-01-06 DIAGNOSIS — Z79899 Other long term (current) drug therapy: Secondary | ICD-10-CM | POA: Diagnosis not present

## 2020-01-06 DIAGNOSIS — M818 Other osteoporosis without current pathological fracture: Secondary | ICD-10-CM | POA: Diagnosis not present

## 2020-01-09 DIAGNOSIS — C716 Malignant neoplasm of cerebellum: Secondary | ICD-10-CM | POA: Diagnosis not present

## 2020-01-12 ENCOUNTER — Other Ambulatory Visit: Payer: Self-pay | Admitting: "Endocrinology

## 2020-01-12 DIAGNOSIS — E039 Hypothyroidism, unspecified: Secondary | ICD-10-CM | POA: Diagnosis not present

## 2020-01-12 DIAGNOSIS — E291 Testicular hypofunction: Secondary | ICD-10-CM | POA: Diagnosis not present

## 2020-01-12 DIAGNOSIS — Z720 Tobacco use: Secondary | ICD-10-CM | POA: Diagnosis not present

## 2020-01-12 DIAGNOSIS — E2749 Other adrenocortical insufficiency: Secondary | ICD-10-CM | POA: Diagnosis not present

## 2020-01-12 DIAGNOSIS — Z7952 Long term (current) use of systemic steroids: Secondary | ICD-10-CM | POA: Diagnosis not present

## 2020-01-14 ENCOUNTER — Other Ambulatory Visit: Payer: Self-pay

## 2020-01-26 ENCOUNTER — Other Ambulatory Visit: Payer: Medicare Other

## 2020-01-26 ENCOUNTER — Other Ambulatory Visit: Payer: Self-pay

## 2020-01-27 ENCOUNTER — Other Ambulatory Visit: Payer: Self-pay | Admitting: Family Medicine

## 2020-01-27 ENCOUNTER — Ambulatory Visit (INDEPENDENT_AMBULATORY_CARE_PROVIDER_SITE_OTHER): Payer: Medicare Other | Admitting: Family Medicine

## 2020-01-27 DIAGNOSIS — E871 Hypo-osmolality and hyponatremia: Secondary | ICD-10-CM | POA: Diagnosis not present

## 2020-01-27 DIAGNOSIS — C716 Malignant neoplasm of cerebellum: Secondary | ICD-10-CM

## 2020-01-27 DIAGNOSIS — D72829 Elevated white blood cell count, unspecified: Secondary | ICD-10-CM

## 2020-01-27 DIAGNOSIS — E039 Hypothyroidism, unspecified: Secondary | ICD-10-CM | POA: Diagnosis not present

## 2020-01-27 DIAGNOSIS — E890 Postprocedural hypothyroidism: Secondary | ICD-10-CM

## 2020-01-27 DIAGNOSIS — E291 Testicular hypofunction: Secondary | ICD-10-CM

## 2020-01-27 DIAGNOSIS — Z7409 Other reduced mobility: Secondary | ICD-10-CM

## 2020-01-27 DIAGNOSIS — E038 Other specified hypothyroidism: Secondary | ICD-10-CM | POA: Diagnosis not present

## 2020-01-27 DIAGNOSIS — E2740 Unspecified adrenocortical insufficiency: Secondary | ICD-10-CM

## 2020-01-27 MED ORDER — PREDNISONE 5 MG PO TABS: 7.5000 mg | ORAL_TABLET | Freq: Every day | ORAL | Status: DC

## 2020-01-27 NOTE — Progress Notes (Signed)
Telephone visit  Subjective: CC: chronic follow up PCP: [name], DO [id number] D Langenfeld is a 24 y.o. male calls for telephone consult today. Patient provides verbal consent for consult held via phone.  Due to COVID-19 pandemic this visit was conducted virtually. This visit type was conducted due to national recommendations for restrictions regarding the COVID-19 Pandemic (e.g. social distancing, sheltering in place) in an effort to limit this patient's exposure and mitigate transmission in our community. All issues noted in this document were discussed and addressed.  A physical exam was not performed with this format.   Location of patient: home Location of provider: Working remotely from home Others present for call: [name], aunt  1. Adrenal insufficiency/ hypothyroidism Patient recently seen by his endocrinologist with [facility].  He was reduced on his prednisone to 7-1/2 mg daily.  His aunt knows that he was being mistakenly given only one of the 5 mg tablets as they did not recognize that they were supposed to break Adam additional tablet in half and give him daily.  He has had decreased appetite and been a little bit more sluggish.  They rectified this issue and started giving him 7.5 mg again today.  Also, he was supposed to get labs including testosterone and thyroid done but unfortunately they forgot to get the labs performed.  He came in this morning to have the labs drawn and would like me to order these and sent to endocrinology.  2.  Leukocytosis Patient has seen Dr. Florene Glen for his leukocytosis.  This was thought to be related to his chronic steroid use and chronic smoking.  Nothing to suggest MDS/leukemia.  Recommendation was to continue checking CBC with differential every 3 months and having them sent to the specialist for further evaluation and management.  3.  History of medulloblastoma/limited mobility  Patient has upcoming appointment with  Dr. Clovis Riley.  His last appointment was in January.  He recently had an MRI.  They will be seeing him later this week to review this study.  His aunt notes that likely Dr. Clovis Riley was able to arrange for wheelchair, shower chair and other medical supply needs after insurance declined my orders.  Overall the patient seems to be doing well with these.  She still struggling to get his safety strap as apparently this is not covered by insurance.  She was also interested in getting a ramp but understands that Medicare/Medicaid will not cover this.  She is currently actively looking for charity that may be able to bill this at the home for him.  He was referred to a cancer survivorship clinic and she is really looking forward to him establishing care with this.    ROS: Per HPI  No Known Allergies Past Medical History:  Diagnosis Date  . Adrenal insufficiency ([place])   . Cancer ([place])    brain tumor on brain stem  . Hydrocephalus ([place])   . Osteoporosis   . Thyroid disease     Current Outpatient Medications:  .  acetaminophen-codeine (TYLENOL #3) 300-30 MG tablet, Take 1 tablet by mouth every 6 (six) hours as needed for moderate pain., Disp: , Rfl:  .  calcium citrate-vitamin D (CITRACAL+D) 315-200 MG-UNIT tablet, Take by mouth daily., Disp: , Rfl:  .  levETIRAcetam (KEPPRA) 1000 MG tablet, Take 1 tablet (1,000 mg total) by mouth 2 (two) times daily., Disp: 60 tablet, Rfl: 2 .  levothyroxine (SYNTHROID) 100 MCG tablet, Take 1 tablet (100 mcg total) by mouth  daily before breakfast., Disp: 95 tablet, Rfl: 3 .  naproxen (NAPROSYN) 500 MG tablet, TAKE 1 TABLET (500 MG TOTAL) BY MOUTH 2 (TWO) TIMES DAILY WITH A MEAL., Disp: 60 tablet, Rfl: 1 .  predniSONE (DELTASONE) 10 MG tablet, Take 1 tablet (10 mg total) by mouth daily with breakfast., Disp: 95 tablet, Rfl: 3 .  Testosterone 20.25 MG/ACT (1.62%) GEL, ONE PUM ON EACH SHOULDER EVERY MORNIG, Disp: 75 g, Rfl: 1  Assessment/ Plan: 24 y.o. male   1. Adrenal  insufficiency (HCC) No need for ongoing cortisol checks per endocrinology.  Patient recently reduced to 7.5mg  of prednisone.  2. Postoperative hypothyroidism FT4 checks only per endocrinology - T4, Free  3. Hypogonadism, male Managed by endo but patient did not get labs.  Will cc results to them - Testosterone,Free and Total  4. 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[place])  5. Impaired mobility and endurance Needs a safety strap to help assist with ambulation.  Needs ramp. - referral to CCM  6. Leukocytosis, unspecified type Monitored by Hematology.  Recommend every 66m cbc w/ diff - CBC with Differential  7. Hyponatremia - Basic Metabolic Panel  Orders Placed This Encounter  Procedures  . T4, Free  . Testosterone,Free and Total  . Basic Metabolic Panel  . CBC with Differential  . Referral to Chronic Care Management Services    Referral Priority:   Routine    Referral Type:   Consultation    Referral Reason:   Care Coordination    Number of Visits Requested:   1   Start time: 3:00pm End time: 3:33pm  Total time spent on patient care (including telephone call/ virtual visit): 42 minutes  [place], [place] [phone]

## 2020-01-28 DIAGNOSIS — C716 Malignant neoplasm of cerebellum: Secondary | ICD-10-CM | POA: Diagnosis not present

## 2020-01-28 DIAGNOSIS — Z7982 Long term (current) use of aspirin: Secondary | ICD-10-CM | POA: Diagnosis not present

## 2020-01-28 DIAGNOSIS — I639 Cerebral infarction, unspecified: Secondary | ICD-10-CM | POA: Diagnosis not present

## 2020-01-28 LAB — COMPREHENSIVE METABOLIC PANEL
ALT: 17 IU/L (ref 0–44)
AST: 13 IU/L (ref 0–40)
Albumin/Globulin Ratio: 2.1 (ref 1.2–2.2)
Albumin: 4 g/dL — ABNORMAL LOW (ref 4.1–5.2)
Alkaline Phosphatase: 85 IU/L (ref 39–117)
BUN/Creatinine Ratio: 5 — ABNORMAL LOW (ref 9–20)
BUN: 5 mg/dL — ABNORMAL LOW (ref 6–20)
Bilirubin Total: 0.3 mg/dL (ref 0.0–1.2)
CO2: 28 mmol/L (ref 20–29)
Calcium: 9.5 mg/dL (ref 8.7–10.2)
Chloride: 93 mmol/L — ABNORMAL LOW (ref 96–106)
Creatinine, Ser: 1.01 mg/dL (ref 0.76–1.27)
GFR calc Af Amer: 121 mL/min/{1.73_m2} (ref >59)
GFR calc non Af Amer: 104 mL/min/{1.73_m2} (ref >59)
Globulin, Total: 1.9 g/dL (ref 1.5–4.5)
Glucose: 72 mg/dL (ref 65–99)
Potassium: 3.3 mmol/L — ABNORMAL LOW (ref 3.5–5.2)
Sodium: 135 mmol/L (ref 134–144)
Total Protein: 5.9 g/dL — ABNORMAL LOW (ref 6.0–8.5)

## 2020-01-28 LAB — CBC WITH DIFFERENTIAL/PLATELET
Basophils Absolute: 0.1 10*3/uL (ref 0.0–0.2)
Basos: 1 %
EOS (ABSOLUTE): 0.5 10*3/uL — ABNORMAL HIGH (ref 0.0–0.4)
Eos: 4 %
Hematocrit: 44.4 % (ref 37.5–51.0)
Hemoglobin: 15.2 g/dL (ref 13.0–17.7)
Immature Grans (Abs): 0.1 10*3/uL (ref 0.0–0.1)
Immature Granulocytes: 1 %
Lymphocytes Absolute: 3.8 10*3/uL — ABNORMAL HIGH (ref 0.7–3.1)
Lymphs: 30 %
MCH: 32.9 pg (ref 26.6–33.0)
MCHC: 34.2 g/dL (ref 31.5–35.7)
MCV: 96 fL (ref 79–97)
Monocytes Absolute: 1.3 10*3/uL — ABNORMAL HIGH (ref 0.1–0.9)
Monocytes: 10 %
Neutrophils Absolute: 7 10*3/uL (ref 1.4–7.0)
Neutrophils: 54 %
Platelets: 337 10*3/uL (ref 150–450)
RBC: 4.62 x10E6/uL (ref 4.14–5.80)
RDW: 13.2 % (ref 11.6–15.4)
WBC: 12.7 10*3/uL — ABNORMAL HIGH (ref 3.4–10.8)

## 2020-01-28 LAB — TESTOSTERONE: Testosterone: 466 ng/dL (ref 264–916)

## 2020-01-28 LAB — T4, FREE: Free T4: 1.46 ng/dL (ref 0.82–1.77)

## 2020-01-28 LAB — TSH: TSH: 0.074 u[IU]/mL — ABNORMAL LOW (ref 0.450–4.500)

## 2020-01-30 ENCOUNTER — Telehealth: Payer: Self-pay | Admitting: Family Medicine

## 2020-01-30 NOTE — Chronic Care Management (AMB) (Signed)
  Chronic Care Management   Outreach Note  01/30/2020 Name: James Hoffman MRN: [id number] DOB: November 28, 1995  James Hoffman is a 24 y.o. year old male who is a primary care patient of [name], DO. I reached out to James Hoffman by phone today in response to a referral sent by James Hoffman's PCP, James Doss DO     An unsuccessful telephone outreach was attempted today. The patient was referred to the case management team for assistance with care management and care coordination.   Follow Up Plan: A HIPPA compliant phone message was left for the patient providing contact information and requesting a return call.  The care management team will reach out to the patient again over the next 7 days.  If patient returns call to provider office, please advise to call Embedded Care Management Care Guide Glenna Durand LPN at [phone]  Tinsley Everman, LPN Health Advisor, [place] Management ??[email or web address] ??[phone]

## 2020-02-02 NOTE — Chronic Care Management (AMB) (Signed)
  Chronic Care Management   Note  02/02/2020 Name: KINGSTEN ENFIELD MRN: [id number] DOB: 22-Apr-1996  ROSALIO CATTERTON is a 24 y.o. year old male who is a primary care patient of [name], DO. I reached out to [name] by phone today in response to a referral sent by Mr. Coden Franchi Surita's PCP, Ronnie Doss DO     Mr. Lindblad was given information about Chronic Care Management services today including:  1. CCM service includes personalized support from designated clinical staff supervised by his physician, including individualized plan of care and coordination with other care providers 2. 24/7 contact phone numbers for assistance for urgent and routine care needs. 3. Service will only be billed when office clinical staff spend 20 minutes or more in a month to coordinate care. 4. Only one practitioner may furnish and bill the service in a calendar month. 5. The patient may stop CCM services at any time (effective at the end of the month) by phone call to the office staff. 6. The patient will be responsible for cost sharing (co-pay) of up to 20% of the service fee (after annual deductible is met).  Patient's aunt agreed to services and verbal consent obtained.   Follow up plan: Telephone appointment with care management team member scheduled for:02/04/2020  Glenna Durand, LPN Health Advisor, [place] Management ??[email or web address] ??[phone]

## 2020-02-04 ENCOUNTER — Ambulatory Visit: Payer: Medicare Other | Admitting: *Deleted

## 2020-02-04 NOTE — Chronic Care Management (AMB) (Signed)
  Chronic Care Management   Outreach Note  02/04/2020 Name: James Hoffman MRN: [id number] DOB: 04/13/1996  Referred by: [name], DO Reason for referral : Chronic Care Management (Initial Visit)   An unsuccessful Initial Telephone Visit was attempted today. The patient was referred to the case management team for assistance with care management and care coordination.  Follow Up Plan: A HIPPA compliant phone message was left for the patient providing contact information and requesting a return call.   I will consult with our [place] regarding rescheduling his appointment and verify that he is eligible for billable services since he has Medicare and Medicaid.   Chong Sicilian, BSN, RN-BC Embedded Chronic Care Manager [facility] Direct Dial: [phone]

## 2020-02-06 ENCOUNTER — Telehealth: Payer: Self-pay | Admitting: Family Medicine

## 2020-02-06 DIAGNOSIS — R918 Other nonspecific abnormal finding of lung field: Secondary | ICD-10-CM | POA: Diagnosis not present

## 2020-02-06 DIAGNOSIS — Z20822 Contact with and (suspected) exposure to covid-19: Secondary | ICD-10-CM | POA: Diagnosis not present

## 2020-02-06 DIAGNOSIS — D72829 Elevated white blood cell count, unspecified: Secondary | ICD-10-CM | POA: Diagnosis not present

## 2020-02-06 DIAGNOSIS — R519 Headache, unspecified: Secondary | ICD-10-CM | POA: Diagnosis not present

## 2020-02-06 DIAGNOSIS — H539 Unspecified visual disturbance: Secondary | ICD-10-CM | POA: Diagnosis not present

## 2020-02-06 DIAGNOSIS — Z982 Presence of cerebrospinal fluid drainage device: Secondary | ICD-10-CM | POA: Diagnosis not present

## 2020-02-06 NOTE — Telephone Encounter (Signed)
Genetic testing for what exactly?

## 2020-02-08 ENCOUNTER — Other Ambulatory Visit: Payer: Self-pay | Admitting: Family Medicine

## 2020-02-09 ENCOUNTER — Telehealth: Payer: Self-pay | Admitting: Family Medicine

## 2020-02-09 NOTE — Telephone Encounter (Signed)
TC pt got Topamax from [facility] for migraines last year Please advise

## 2020-02-09 NOTE — Telephone Encounter (Signed)
  Prescription Request  02/09/2020  What is the name of the medication or equipment? Pt needs topamax for his migraines. He went to [facility] over the weekend.  He has an upcoming appt with dr Lajuana Ripple  Have you contacted your pharmacy to request a refill? (if applicable) bi  Which [facility]    Patient notified that their request is being sent to the clinical staff for review and that they should receive a response within 2 business days.

## 2020-02-09 NOTE — Telephone Encounter (Signed)
LMTCB

## 2020-02-10 ENCOUNTER — Ambulatory Visit: Payer: Medicare Other

## 2020-02-10 ENCOUNTER — Other Ambulatory Visit: Payer: Self-pay | Admitting: Family Medicine

## 2020-02-10 DIAGNOSIS — G40909 Epilepsy, unspecified, not intractable, without status epilepticus: Secondary | ICD-10-CM

## 2020-02-10 NOTE — Telephone Encounter (Signed)
Patient aware and verbalized understanding. °

## 2020-02-10 NOTE — Telephone Encounter (Signed)
I think they are supposed to be seeing neuro soon.  Please have them check with them on appropriateness, dosing.

## 2020-02-12 DIAGNOSIS — G9340 Encephalopathy, unspecified: Secondary | ICD-10-CM | POA: Diagnosis not present

## 2020-02-12 DIAGNOSIS — G40909 Epilepsy, unspecified, not intractable, without status epilepticus: Secondary | ICD-10-CM | POA: Diagnosis not present

## 2020-02-12 DIAGNOSIS — R410 Disorientation, unspecified: Secondary | ICD-10-CM | POA: Insufficient documentation

## 2020-02-12 DIAGNOSIS — Z9221 Personal history of antineoplastic chemotherapy: Secondary | ICD-10-CM | POA: Diagnosis not present

## 2020-02-12 DIAGNOSIS — H905 Unspecified sensorineural hearing loss: Secondary | ICD-10-CM | POA: Diagnosis not present

## 2020-02-12 DIAGNOSIS — Z982 Presence of cerebrospinal fluid drainage device: Secondary | ICD-10-CM | POA: Diagnosis not present

## 2020-02-12 DIAGNOSIS — E039 Hypothyroidism, unspecified: Secondary | ICD-10-CM | POA: Diagnosis not present

## 2020-02-12 DIAGNOSIS — R519 Headache, unspecified: Secondary | ICD-10-CM | POA: Diagnosis not present

## 2020-02-12 DIAGNOSIS — Z923 Personal history of irradiation: Secondary | ICD-10-CM | POA: Diagnosis not present

## 2020-02-12 DIAGNOSIS — C716 Malignant neoplasm of cerebellum: Secondary | ICD-10-CM | POA: Diagnosis not present

## 2020-02-12 DIAGNOSIS — F17200 Nicotine dependence, unspecified, uncomplicated: Secondary | ICD-10-CM | POA: Diagnosis not present

## 2020-02-12 DIAGNOSIS — H9192 Unspecified hearing loss, left ear: Secondary | ICD-10-CM | POA: Diagnosis not present

## 2020-02-12 DIAGNOSIS — R3915 Urgency of urination: Secondary | ICD-10-CM | POA: Diagnosis not present

## 2020-02-12 DIAGNOSIS — E291 Testicular hypofunction: Secondary | ICD-10-CM | POA: Diagnosis not present

## 2020-02-12 DIAGNOSIS — Z781 Physical restraint status: Secondary | ICD-10-CM | POA: Diagnosis not present

## 2020-02-12 DIAGNOSIS — R4182 Altered mental status, unspecified: Secondary | ICD-10-CM | POA: Diagnosis not present

## 2020-02-12 DIAGNOSIS — E2740 Unspecified adrenocortical insufficiency: Secondary | ICD-10-CM | POA: Diagnosis not present

## 2020-02-12 DIAGNOSIS — E2749 Other adrenocortical insufficiency: Secondary | ICD-10-CM | POA: Diagnosis not present

## 2020-02-12 DIAGNOSIS — E038 Other specified hypothyroidism: Secondary | ICD-10-CM | POA: Diagnosis not present

## 2020-02-12 DIAGNOSIS — Z20822 Contact with and (suspected) exposure to covid-19: Secondary | ICD-10-CM | POA: Diagnosis not present

## 2020-02-12 DIAGNOSIS — G936 Cerebral edema: Secondary | ICD-10-CM | POA: Diagnosis not present

## 2020-02-12 NOTE — Telephone Encounter (Signed)
NA- no call back - this encounter will be closed.

## 2020-02-13 DIAGNOSIS — Z781 Physical restraint status: Secondary | ICD-10-CM | POA: Diagnosis not present

## 2020-02-13 DIAGNOSIS — R569 Unspecified convulsions: Secondary | ICD-10-CM | POA: Diagnosis not present

## 2020-02-13 DIAGNOSIS — Z20822 Contact with and (suspected) exposure to covid-19: Secondary | ICD-10-CM | POA: Diagnosis present

## 2020-02-13 DIAGNOSIS — G936 Cerebral edema: Secondary | ICD-10-CM | POA: Diagnosis present

## 2020-02-13 DIAGNOSIS — M810 Age-related osteoporosis without current pathological fracture: Secondary | ICD-10-CM | POA: Diagnosis present

## 2020-02-13 DIAGNOSIS — F17210 Nicotine dependence, cigarettes, uncomplicated: Secondary | ICD-10-CM | POA: Diagnosis present

## 2020-02-13 DIAGNOSIS — E291 Testicular hypofunction: Secondary | ICD-10-CM | POA: Diagnosis present

## 2020-02-13 DIAGNOSIS — E2749 Other adrenocortical insufficiency: Secondary | ICD-10-CM | POA: Diagnosis present

## 2020-02-13 DIAGNOSIS — C716 Malignant neoplasm of cerebellum: Secondary | ICD-10-CM | POA: Diagnosis present

## 2020-02-13 DIAGNOSIS — H9190 Unspecified hearing loss, unspecified ear: Secondary | ICD-10-CM | POA: Diagnosis present

## 2020-02-13 DIAGNOSIS — Z9221 Personal history of antineoplastic chemotherapy: Secondary | ICD-10-CM | POA: Diagnosis not present

## 2020-02-13 DIAGNOSIS — E038 Other specified hypothyroidism: Secondary | ICD-10-CM | POA: Diagnosis present

## 2020-02-13 DIAGNOSIS — Z982 Presence of cerebrospinal fluid drainage device: Secondary | ICD-10-CM | POA: Diagnosis not present

## 2020-02-13 DIAGNOSIS — R9401 Abnormal electroencephalogram [EEG]: Secondary | ICD-10-CM | POA: Diagnosis not present

## 2020-02-13 DIAGNOSIS — Z8673 Personal history of transient ischemic attack (TIA), and cerebral infarction without residual deficits: Secondary | ICD-10-CM | POA: Diagnosis not present

## 2020-02-13 DIAGNOSIS — G40909 Epilepsy, unspecified, not intractable, without status epilepticus: Secondary | ICD-10-CM | POA: Diagnosis present

## 2020-02-13 DIAGNOSIS — R4182 Altered mental status, unspecified: Secondary | ICD-10-CM | POA: Diagnosis not present

## 2020-02-13 DIAGNOSIS — F79 Unspecified intellectual disabilities: Secondary | ICD-10-CM | POA: Diagnosis present

## 2020-02-13 DIAGNOSIS — Z8669 Personal history of other diseases of the nervous system and sense organs: Secondary | ICD-10-CM | POA: Diagnosis not present

## 2020-02-13 DIAGNOSIS — G9340 Encephalopathy, unspecified: Secondary | ICD-10-CM | POA: Diagnosis present

## 2020-02-17 ENCOUNTER — Ambulatory Visit (INDEPENDENT_AMBULATORY_CARE_PROVIDER_SITE_OTHER): Payer: Medicare Other | Admitting: Family Medicine

## 2020-02-17 DIAGNOSIS — H903 Sensorineural hearing loss, bilateral: Secondary | ICD-10-CM | POA: Diagnosis not present

## 2020-02-17 DIAGNOSIS — Z23 Encounter for immunization: Secondary | ICD-10-CM | POA: Diagnosis not present

## 2020-02-17 DIAGNOSIS — H6123 Impacted cerumen, bilateral: Secondary | ICD-10-CM | POA: Insufficient documentation

## 2020-02-17 DIAGNOSIS — L2489 Irritant contact dermatitis due to other agents: Secondary | ICD-10-CM | POA: Diagnosis not present

## 2020-02-17 MED ORDER — CLOTRIMAZOLE-BETAMETHASONE 1-0.05 % EX CREA: 1.0000 "application " | TOPICAL_CREAM | Freq: Two times a day (BID) | CUTANEOUS | 0 refills | Status: DC

## 2020-02-17 NOTE — Progress Notes (Signed)
Telephone visit  Subjective: CC: rash PCP: [name], DO [id number] James Hoffman is a 24 y.o. male calls for telephone consult today. Patient provides verbal consent for consult held via phone.  Due to COVID-19 pandemic this visit was conducted virtually. This visit type was conducted due to national recommendations for restrictions regarding the COVID-19 Pandemic (e.g. social distancing, sheltering in place) in an effort to limit this patient's exposure and mitigate transmission in our community. All issues noted in this document were discussed and addressed.  A physical exam was not performed with this format.   Location of patient: in car Location of provider: [facility] Others present for call: aunt  1. Rash She reports he was hospitalized for AMS and seizure recently.  He was in an adult diaper that has caused a rash in the groin.  She reports red rash.  No oozing.  It's raw looking.  She has been applying something else to the area but it is not resolving.   ROS: Per HPI  No Known Allergies Past Medical History:  Diagnosis Date  . Adrenal insufficiency ([place])   . Cancer ([place])    brain tumor on brain stem  . Hydrocephalus ([place])   . Osteoporosis   . Thyroid disease     Current Outpatient Medications:  .  acetaminophen-codeine (TYLENOL #3) 300-30 MG tablet, Take 1 tablet by mouth every 6 (six) hours as needed for moderate pain., Disp: , Rfl:  .  calcium citrate-vitamin James (CITRACAL+James) 315-200 MG-UNIT tablet, Take by mouth daily., Disp: , Rfl:  .  levETIRAcetam (KEPPRA) 1000 MG tablet, TAKE 1 TABLET BY MOUTH TWICE A DAY, Disp: 180 tablet, Rfl: 0 .  levothyroxine (SYNTHROID) 100 MCG tablet, Take 1 tablet (100 mcg total) by mouth daily before breakfast., Disp: 95 tablet, Rfl: 3 .  naproxen (NAPROSYN) 500 MG tablet, TAKE 1 TABLET (500 MG TOTAL) BY MOUTH 2 (TWO) TIMES DAILY WITH A MEAL., Disp: 60 tablet, Rfl: 1 .  predniSONE (DELTASONE) 5 MG tablet, Take 1.5 tablets (7.5 mg total)  by mouth daily with breakfast., Disp: , Rfl:  .  Testosterone 20.25 MG/ACT (1.62%) GEL, ONE PUM ON EACH SHOULDER EVERY MORNIG, Disp: 75 g, Rfl: 1  Assessment/ Plan: 24 y.o. male   1. Irritant contact dermatitis due to other agents Will empirically treat for contact and candidal dermatitis.  Desitin/ barrier cream recommended as well.  Follow up if no resolution - clotrimazole-betamethasone (LOTRISONE) cream; Apply 1 application topically 2 (two) times daily. x7-10  Dispense: 30 g; Refill: 0   Start time: 5:47pm End time: 5:55pm  Total time spent on patient care (including telephone call/ virtual visit): 17 minutes  [place], [place] [phone]

## 2020-02-18 ENCOUNTER — Telehealth: Payer: Self-pay | Admitting: *Deleted

## 2020-02-18 ENCOUNTER — Telehealth: Payer: Self-pay | Admitting: Family Medicine

## 2020-02-18 MED ORDER — ALBUTEROL SULFATE (2.5 MG/3ML) 0.083% IN NEBU
2.50 | INHALATION_SOLUTION | RESPIRATORY_TRACT | Status: DC
Start: ? — End: 2020-02-18

## 2020-02-18 MED ORDER — OYSTER SHELL CALCIUM/D 500-200 MG-UNIT PO TABS
1.00 | ORAL_TABLET | ORAL | Status: DC
Start: 2020-02-17 — End: 2020-02-18

## 2020-02-18 MED ORDER — ASCORBIC ACID 500 MG PO TABS
1000.00 | ORAL_TABLET | ORAL | Status: DC
Start: 2020-02-17 — End: 2020-02-18

## 2020-02-18 MED ORDER — ACETAMINOPHEN 325 MG PO TABS
650.00 | ORAL_TABLET | ORAL | Status: DC
Start: ? — End: 2020-02-18

## 2020-02-18 MED ORDER — LEVETIRACETAM 500 MG PO TABS
1500.00 | ORAL_TABLET | ORAL | Status: DC
Start: 2020-02-16 — End: 2020-02-18

## 2020-02-18 MED ORDER — LEVOTHYROXINE SODIUM 100 MCG PO TABS
100.00 | ORAL_TABLET | ORAL | Status: DC
Start: 2020-02-17 — End: 2020-02-18

## 2020-02-18 MED ORDER — GENERIC EXTERNAL MEDICATION
7.50 | Status: DC
Start: 2020-02-17 — End: 2020-02-18

## 2020-02-18 NOTE — Telephone Encounter (Signed)
    Transitional Care Management  Contact Attempt Attempt Date:02/18/2020 Attempted By: Antonietta Barcelona, LPN   1st unsuccessful TCM contact attempt.   I reached out to [name] on his preferred telephone number to discuss Transitional Care Management, medication reconciliation, and to schedule a TCM hospital follow-up with his PCP at [facility].  Discharge Date: 02/16/20 Location: [facility] Discharge Dx: Altered mental status  Recommendations for Outpatient Follow-up:  Not available (insert from discharge summary)   Plan I left a HIPPA compliant message for him to return my call.  Will attempt to contact again within the 2 business day post discharge window if he does not return my call.

## 2020-02-19 NOTE — Telephone Encounter (Signed)
Pt to be contacted VIA TOC encounter.

## 2020-02-19 NOTE — Telephone Encounter (Signed)
Pt was discharged on 4/5 - there was only 1 attempt in the 48 hour timeframe.   Pt called back today from 1st attempt ( out of 48 hour time frame and TOC info was discussed - this is no longer TOC  ( out of the time frame) and would have been a regular hospital follow up - pt declined this at his time, as he just had a TELEvisit with DR G recently.

## 2020-02-20 ENCOUNTER — Telehealth: Payer: Self-pay | Admitting: Family Medicine

## 2020-02-20 ENCOUNTER — Other Ambulatory Visit: Payer: Self-pay | Admitting: "Endocrinology

## 2020-02-20 ENCOUNTER — Ambulatory Visit: Payer: Medicare Other | Admitting: Family Medicine

## 2020-02-20 ENCOUNTER — Ambulatory Visit (INDEPENDENT_AMBULATORY_CARE_PROVIDER_SITE_OTHER): Payer: Medicare Other | Admitting: Licensed Clinical Social Worker

## 2020-02-20 DIAGNOSIS — I9589 Other hypotension: Secondary | ICD-10-CM

## 2020-02-20 DIAGNOSIS — E890 Postprocedural hypothyroidism: Secondary | ICD-10-CM

## 2020-02-20 DIAGNOSIS — C716 Malignant neoplasm of cerebellum: Secondary | ICD-10-CM

## 2020-02-20 DIAGNOSIS — Z87898 Personal history of other specified conditions: Secondary | ICD-10-CM

## 2020-02-20 DIAGNOSIS — C801 Malignant (primary) neoplasm, unspecified: Secondary | ICD-10-CM

## 2020-02-20 NOTE — Chronic Care Management (AMB) (Signed)
  Care Management   Note  02/20/2020 Name: James Hoffman MRN: [id number] DOB: 11/30/1995  James Hoffman is a 24 y.o. year old male who is a primary care patient of [name], DO and is actively engaged with the care management team. I reached out to [name] by phone today to assist with re-scheduling an initial visit with the Licensed Clinical Social Worker  Follow up plan: Telephone appointment with care management team member scheduled for: 03/02/2020.  [place], [place] [address] Direct Dial: [phone] [email or web address]: [email or web address]

## 2020-02-20 NOTE — Chronic Care Management (AMB) (Signed)
  Care Management Note   James Hoffman is a 24 y.o. year old male who is a primary care patient of [name], DO. The CM team was consulted for assistance with chronic disease management and care coordination.   I reached out to Juanda Bond [place]/Courtney Florene Glen, aunt by phone today.     Review of patient status, including review of consultants reports, relevant laboratory and other test results, and collaboration with appropriate care team members and the patient's provider was performed as part of comprehensive patient evaluation and provision of chronic care management services.   Social determinants of health: risk of depression; risk of stress; risk of physical inactivity    Chronic Care Management from 02/20/2020 in [facility]  PHQ-9 Total Score  5     GAD 7 : Generalized Anxiety Score 02/20/2020  Nervous, Anxious, on Edge 1  Control/stop worrying 1  Worry too much - different things 1  Trouble relaxing 1  Restless 0  Easily annoyed or irritable 0  Afraid - awful might happen 0  Total GAD 7 Score 4  Anxiety Difficulty Somewhat difficult   Medications   (very important)  New medications from outside sources are available for reconciliation  acetaminophen-codeine (TYLENOL #3) 300-30 MG tablet calcium citrate-vitamin D (CITRACAL+D) 315-200 MG-UNIT tablet clotrimazole-betamethasone (LOTRISONE) cream levETIRAcetam (KEPPRA) 1000 MG tablet levothyroxine (SYNTHROID) 100 MCG tablet naproxen (NAPROSYN) 500 MG tablet predniSONE (DELTASONE) 5 MG tablet Testosterone 20.25 MG/ACT (1.62%) GEL  Goals Addressed            This Visit's Progress   . Client or family representative to talk with LCSW in next 30 days to discuss client daily needs, ADLs neds, and needs regarding wheelchair ramp (pt-stated)       [place] (see longtitudinal plan of care for additional care plan information)  Current Barriers:  . Wheelchair bound patient with  Chronic Diagnoses of Hypotension, Hypothyroidism, Medullar Carcinoma, Hx brain tumor, Medulla blastoma . Hearing deficits . Communication challenges  Clinical Social Work Clinical Goal(s):  . Client/family representative to talk with LCSW in next 30 days to discuss client daily needs, ADL needs and needs regarding wheelchair ramp  Interventions: . Talked with [name], aunt about client current needs . Talked with [name] about client needs related to wheelchair ramp . Provided counseling support for [name], aunt and caregiver for client . Talked with [name] about Guardianship information for client . Talked with [name] about ADTS contact, Waymond Cera, related to ramp construction information . Talked with [name] about ambulation needs of client . Talked with [name] about weight issues of client and about appetite of client . Talked with [name] about vision challenges of client . Talked with [name] about RNCM support with CCM program . Talked with [name] about [employer]	[place] as possible resource for client . Talked with [name] about client relaxation techniques (listens to music, watches favorite TV shows, enjoys spending time with family members) . Talked with [name] about DME equipment of client (has wheelchair, Recruitment consultant)   Patient Self Care Activities:  Feeds self with set up  [place] Deficits Mobility deficits Wheelchair dependent  Initial goal documentation      Follow Up Plan: LCSW to call client or [name] in next 4 weeks to discuss client needs at that time   [place].Nole Robey MSW, LCSW Licensed Clinical Social Worker [facility] Care Management [phone]

## 2020-02-20 NOTE — Patient Instructions (Addendum)
Licensed Clinical Social Worker Visit Information  Goals we discussed today:  Goals Addressed            This Visit's Progress   . Client or family representative to talk with LCSW in next 30 days to discuss client daily needs, ADLs neds, and needs regarding wheelchair ramp (pt-stated)       [place] (see longtitudinal plan of care for additional care plan information)  Current Barriers:  . Wheelchair bound . Hearing deficits . Communication challenges  Clinical Social Work Clinical Goal(s):  . Client/family representative to talk with LCSW in next 30 days to discuss client daily needs, ADL needs and needs regarding wheelchair ramp  Interventions: . Talked with James Hoffman, aunt about client current needs . Talked with James Hoffman about client needs related to wheelchair ramp . Provided counseling support for James Hoffman, aunt and caregiver for client . Talked with James Hoffman about Guardianship information for client . Talked with James Hoffman about ADTS contact, Waymond Cera, related to ramp construction information . Talked with James Hoffman about ambulation needs of client . Talked with James Hoffman about weight issues of client and about appetite of client . Talked with James Hoffman about vision challenges of client . Talked with James Hoffman about RNCM support with CCM program . Talked with James Hoffman about [employer]	[place] as possible resource for client . Talked with James Hoffman about client relaxation techniques (listens to music, watches favorite TV shows, enjoys spending time with family members) . Talked with James Hoffman about DME equipment of client (has wheelchair, Recruitment consultant)   Patient Self Care Activities:  Feeds self with set up  Self Care Deficits Mobility deficits Wheelchair dependent  Initial goal documentation       Materials Provided: No  Follow Up Plan: LCSW to call client or James Hoffman in next 4 weeks to discuss client needs at that  time  The patient/James Hoffman, aunt, verbalized understanding of instructions provided today and declined a print copy of patient instruction materials.   [name].[name] MSW, LCSW Licensed Clinical Social Worker [facility] Care Management [phone]

## 2020-02-24 ENCOUNTER — Ambulatory Visit (INDEPENDENT_AMBULATORY_CARE_PROVIDER_SITE_OTHER): Payer: Medicare Other | Admitting: *Deleted

## 2020-02-24 DIAGNOSIS — Z0000 Encounter for general adult medical examination without abnormal findings: Secondary | ICD-10-CM

## 2020-02-24 NOTE — Progress Notes (Signed)
MEDICARE ANNUAL WELLNESS VISIT  02/24/2020  Telephone Visit Disclaimer This Medicare AWV was conducted by telephone due to national recommendations for restrictions regarding the COVID-19 Pandemic (e.g. social distancing).  I verified, using two identifiers, that I am speaking with James Hoffman or their authorized healthcare agent. I discussed the limitations, risks, security, and privacy concerns of performing an evaluation and management service by telephone and the potential availability of an in-person appointment in the future. The patient expressed understanding and agreed to proceed.   Subjective:  James Hoffman is a 24 y.o. male patient of James Norlander, DO who had a Medicare Annual Wellness Visit today via telephone. His Aunt James Hoffman was on the phone to help answer questions. James Hoffman is Disabled and lives with their grandparents. he has 0 children. he reports that he is socially active and does interact with friends/family regularly. he is minimally physically active and enjoys listening to music, sitting outside in the sun, watching Supernatural, getting out of the house and spending time with his family.  Patient Care Team: James Norlander, DO as PCP - General (Family Medicine) Ilean China, RN as Registered Nurse Shea Evans, [name], LCSW as Social Worker (Licensed Clinical Social Worker)  Advanced Directives 02/24/2020 12/06/2017 10/28/2017 06/25/2017 12/26/2016 04/04/2016 09/09/2015  Does Patient Have a Medical Advance Directive? No No No No No No No  Would patient like information on creating a medical advance directive? No - Patient declined - No - Patient declined - - - No - patient declined information    Hospital Utilization Over the Past 12 Months: # of hospitalizations or ER visits: 4 # of surgeries: 0  Review of Systems    Patient reports that his overall health is unchanged compared to last year.  History obtained from chart review   Patient Reported Readings (BP, Pulse, CBG, Weight, etc) none  Pain Assessment Pain : No/denies pain     Current Medications & Allergies (verified) Allergies as of 02/24/2020      Reactions   Tramadol Anaphylaxis   Morphine Nausea And Vomiting   According to mother the patient is intolerant of morphine; causes nausea and vomiting.  Has had none since he was 24yo.      Medication List       Accurate as of February 24, 2020 12:40 PM. If you have any questions, ask your nurse or doctor.        STOP taking these medications   acetaminophen-codeine 300-30 MG tablet Commonly known as: TYLENOL #3   calcium citrate-vitamin D 315-200 MG-UNIT tablet Commonly known as: CITRACAL+D     TAKE these medications   Cholecalciferol 50 MCG (2000 UT) Caps Take by mouth.   clotrimazole-betamethasone cream Commonly known as: Lotrisone Apply 1 application topically 2 (two) times daily. x7-10   levETIRAcetam 750 MG tablet Commonly known as: KEPPRA Take 1,500 mg by mouth in the morning and at bedtime. What changed: Another medication with the same name was removed. Continue taking this medication, and follow the directions you see here.   levothyroxine 100 MCG tablet Commonly known as: SYNTHROID Take 1 tablet (100 mcg total) by mouth daily before breakfast.   naproxen 500 MG tablet Commonly known as: NAPROSYN TAKE 1 TABLET (500 MG TOTAL) BY MOUTH 2 (TWO) TIMES DAILY WITH A MEAL.   predniSONE 5 MG tablet Commonly known as: DELTASONE Take 1.5 tablets (7.5 mg total) by mouth daily with breakfast.   Testosterone 20.25 MG/ACT (1.62%) Gel ONE PUM  ON EACH SHOULDER EVERY MORNIG       History (reviewed): Past Medical History:  Diagnosis Date  . Adrenal insufficiency ([place])   . 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 Hydrocephalus ([place])   . Osteoporosis   . Thyroid disease    Past Surgical History:  Procedure Laterality Date  . brain tumor removal  August 2012  . EYE SURGERY  Right 2014   cataract removed  . HIP SURGERY    . teeth removal     History reviewed. No pertinent family history. Social History   Socioeconomic History  . Marital status: Single    Spouse name: Not on file  . Number of children: 0  . Years of education: 7th grade  . Highest education level: 7th grade  Occupational History  . Occupation: disabled  Tobacco Use  . Smoking status: Current Every Day Smoker    Packs/day: 0.25    Years: 6.00    Pack years: 1.50    Types: Cigarettes    Start date: 11/25/2012  . Smokeless tobacco: Never Used  Substance and Sexual Activity  . Alcohol use: No  . Drug use: No  . Sexual activity: Not Currently  Other Topics Concern  . Not on file  Social History Narrative  . Not on file   Social Determinants of Health   Financial Resource Strain: Low Risk   . Difficulty of Paying Living Expenses: Not very hard  Food Insecurity: No Food Insecurity  . Worried About Charity fundraiser in the Last Year: Never true  . Ran Out of Food in the Last Year: Never true  Transportation Needs: No Transportation Needs  . Lack of Transportation (Medical): No  . Lack of Transportation (Non-Medical): No  Physical Activity: Inactive  . Days of Exercise per Week: 0 days  . Minutes of Exercise per Session: 0 min  Stress: Stress Concern Present  . Feeling of Stress : To some extent  Social Connections: Moderately Isolated  . Frequency of Communication with Friends and Family: More than three times a week  . Frequency of Social Gatherings with Friends and Family: More than three times a week  . Attends Religious Services: Never  . Active Member of Clubs or Organizations: No  . Attends Archivist Meetings: Never  . Marital Status: Never married    Activities of Daily Living In your present state of health, do you have any difficulty performing the following activities: 02/24/2020  Hearing? Y  Comment seeing Audiology to get hearing aids  Vision? Y   Comment has a cataract in the left eye that needs to be removed  Difficulty concentrating or making decisions? N  Walking or climbing stairs? Y  Comment limited mobility-uses wheelchair and walker  Dressing or bathing? Y  Comment uses a shower chair and has to have help with bathing-can dress himself  Doing errands, shopping? Y  Comment his aunt takes him to all appointments and to run all errands for him  Preparing Food and eating ? Y  Comment all of his meals are prepared for him-is able to feed himself  Using the Toilet? Y  Comment needs help to get on and off the toilet at times  In the past six months, have you accidently leaked urine? Y  Do you have problems with loss of bowel control? N  Managing your Medications? Y  Comment his family manages his medications for him  Managing your Finances? Darreld Mclean  Comment his finances are managed by his grandmother  Housekeeping or managing your Housekeeping? Y  Comment his family takes care of all the housekeeping  Some recent data might be hidden    Patient Education/ Literacy How often do you need to have someone help you when you read instructions, pamphlets, or other written materials from your doctor or pharmacy?: 5 - Always What is the last grade level you completed in school?: 7th grade  Exercise Current Exercise Habits: The patient does not participate in regular exercise at present, Exercise limited by: neurologic condition(s);orthopedic condition(s)  Diet Patient reports consuming 2 meals a day and 3 snack(s) a day Patient reports that his primary diet is: Regular Patient reports that she does have regular access to food.   Depression Screen PHQ 2/9 Scores 02/24/2020 02/20/2020 10/29/2019 12/11/2017 11/21/2017 08/13/2017 02/07/2017  PHQ - 2 Score 1 2 4  0 1 0 0  PHQ- 9 Score 5 5 7  - - - -  Exception Documentation - - - - - - -     Fall Risk Fall Risk  02/24/2020 12/11/2017 08/13/2017 02/07/2017 12/18/2016  Falls in the past year? 1 No Yes  Yes Yes  Number falls in past yr: 0 - 1 1 1   Injury with Fall? 0 - Yes No Yes  Comment - - - - sprained right ankle  Risk Factor Category  - - High Fall Risk - -  Risk for fall due to : Impaired mobility;Impaired vision - History of fall(s);Impaired balance/gait;Impaired mobility - -  Follow up - - Falls evaluation completed - -     Objective:  James Hoffman seemed alert and oriented and he participated appropriately during our telephone visit.  Blood Pressure Weight BMI  BP Readings from Last 3 Encounters:  10/29/19 139/88  10/16/19 124/90  10/08/19 118/61   Wt Readings from Last 3 Encounters:  10/29/19 157 lb (71.2 kg)  10/28/17 144 lb (65.3 kg)  10/13/17 144 lb (65.3 kg)   BMI Readings from Last 1 Encounters:  10/29/19 22.53 kg/m    *Unable to obtain current vital signs, weight, and BMI due to telephone visit type  Hearing/Vision  . Shavar did not seem to have difficulty with hearing/understanding during the telephone conversation . Reports that he has had a formal eye exam by an eye care professional within the past year . Reports that he has had a formal hearing evaluation within the past year *Unable to fully assess hearing and vision during telephone visit type  Cognitive Function: No flowsheet data found. (Normal:0-7, Significant for Dysfunction: >8)  Normal Cognitive Function Screening: No: he was unable to perform the 6CIT over the phone. Could try to do MMSE in office if PCP would like. Pt does follow up with multiple specialists including Neuro due to his history of brain tumor and seizures.   Immunization & Health Maintenance Record Immunization History  Administered Date(s) Administered  . DTaP 05/15/1996, 07/17/1996, 10/21/1996, 09/13/1998, 05/09/2001  . Hepatitis B 05/15/1996, 07/17/1996, 10/21/1996  . HiB (PRP-OMP) 05/21/1996, 07/17/1996, 10/21/1996, 09/13/1998  . IPV 05/15/1996, 07/17/1996, 10/21/1996, 05/09/2001  . Influenza,inj,Quad PF,6+ Mos  10/29/2019  . Tdap 07/06/2008    Health Maintenance  Topic Date Due  . Samul Dada  07/06/2018  . INFLUENZA VACCINE  06/13/2020  . HIV Screening  Completed       Assessment  This is a routine wellness examination for ASTOR DOLCH.  Health Maintenance: Due or Overdue Health Maintenance Due  Topic Date Due  .  TETANUS/TDAP  07/06/2018    James Hoffman does not need a referral for Community Assistance: Care Management:   Enrolled with Chong Sicilian, RN Social Work:    Enrolled with Nunzio Cobbs, CSW Prescription Assistance:  no Nutrition/Diabetes Education:  no   Plan:  Personalized Goals Goals Addressed            This Visit's Progress   . Quit Smoking        Personalized Health Maintenance & Screening Recommendations  TDAP vaccine  Lung Cancer Screening Recommended: no (Low Dose CT Chest recommended if Age 26-80 years, 30 pack-year currently smoking OR have quit w/in past 15 years) Hepatitis C Screening recommended: no HIV Screening recommended: no  Advanced Directives: Written information was not prepared per patient's request.  Referrals & Orders No orders of the defined types were placed in this encounter.   Follow-up Plan . Follow-up with James Norlander, DO as planned . Consider TDAP vaccine at next visit with PCP . Keep appointment with Audiology for hearing aids . Keep appointment with the eye doctor regarding follow up on cataracts . Bring a copy of Advanced Directives in once the documents have been completed   I have personally reviewed and noted the following in the patient's chart:   . Medical and social history . Use of alcohol, tobacco or illicit drugs  . Current medications and supplements . Functional ability and status . Nutritional status . Physical activity . Advanced directives . List of other physicians . Hospitalizations, surgeries, and ER visits in previous 12 months . Vitals . Screenings to include  cognitive, depression, and falls . Referrals and appointments  In addition, I have reviewed and discussed with James Hoffman certain preventive protocols, quality metrics, and best practice recommendations. A written personalized care plan for preventive services as well as general preventive health recommendations is available and can be mailed to the patient at his request.      Milas Hock, LPN  [id number]

## 2020-02-24 NOTE — Patient Instructions (Signed)
Preventive Care 19-24 Years Old, Male Preventive care refers to lifestyle choices and visits with your health care provider that can promote health and wellness. This includes:  A yearly physical exam. This is also called an annual well check.  Regular dental and eye exams.  Immunizations.  Screening for certain conditions.  Healthy lifestyle choices, such as eating a healthy diet, getting regular exercise, not using drugs or products that contain nicotine and tobacco, and limiting alcohol use. What can I expect for my preventive care visit? Physical exam Your health care provider will check:  Height and weight. These may be used to calculate body mass index (BMI), which is a measurement that tells if you are at a healthy weight.  Heart rate and blood pressure.  Your skin for abnormal spots. Counseling Your health care provider may ask you questions about:  Alcohol, tobacco, and drug use.  Emotional well-being.  Home and relationship well-being.  Sexual activity.  Eating habits.  Work and work Statistician. What immunizations do I need?  Influenza (flu) vaccine  This is recommended every year. Tetanus, diphtheria, and pertussis (Tdap) vaccine  You may need a Td booster every 10 years. Varicella (chickenpox) vaccine  You may need this vaccine if you have not already been vaccinated. Human papillomavirus (HPV) vaccine  If recommended by your health care provider, you may need three doses over 6 months. Measles, mumps, and rubella (MMR) vaccine  You may need at least one dose of MMR. You may also need a second dose. Meningococcal conjugate (MenACWY) vaccine  One dose is recommended if you are 45-76 years old and a Market researcher living in a residence hall, or if you have one of several medical conditions. You may also need additional booster doses. Pneumococcal conjugate (PCV13) vaccine  You may need this if you have certain conditions and were not  previously vaccinated. Pneumococcal polysaccharide (PPSV23) vaccine  You may need one or two doses if you smoke cigarettes or if you have certain conditions. Hepatitis A vaccine  You may need this if you have certain conditions or if you travel or work in places where you may be exposed to hepatitis A. Hepatitis B vaccine  You may need this if you have certain conditions or if you travel or work in places where you may be exposed to hepatitis B. Haemophilus influenzae type b (Hib) vaccine  You may need this if you have certain risk factors. You may receive vaccines as individual doses or as more than one vaccine together in one shot (combination vaccines). Talk with your health care provider about the risks and benefits of combination vaccines. What tests do I need? Blood tests  Lipid and cholesterol levels. These may be checked every 5 years starting at age 17.  Hepatitis C test.  Hepatitis B test. Screening   Diabetes screening. This is done by checking your blood sugar (glucose) after you have not eaten for a while (fasting).  Sexually transmitted disease (STD) testing. Talk with your health care provider about your test results, treatment options, and if necessary, the need for more tests. Follow these instructions at home: Eating and drinking   Eat a diet that includes fresh fruits and vegetables, whole grains, lean protein, and low-fat dairy products.  Take vitamin and mineral supplements as recommended by your health care provider.  Do not drink alcohol if your health care provider tells you not to drink.  If you drink alcohol: ? Limit how much you have to 0-2  drinks a day. ? Be aware of how much alcohol is in your drink. In the [place] one drink equals one 12 oz bottle of beer (355 mL), one 5 oz glass of wine (148 mL), or one 1 oz glass of hard liquor (44 mL). Lifestyle  Take daily care of your teeth and gums.  Stay active. Exercise for at least 30 minutes on 5 or  more days each week.  Do not use any products that contain nicotine or tobacco, such as cigarettes, e-cigarettes, and chewing tobacco. If you need help quitting, ask your health care provider.  If you are sexually active, practice safe sex. Use a condom or other form of protection to prevent STIs (sexually transmitted infections). What's next?  Go to your health care provider once a year for a well check visit.  Ask your health care provider how often you should have your eyes and teeth checked.  Stay up to date on all vaccines. This information is not intended to replace advice given to you by your health care provider. Make sure you discuss any questions you have with your health care provider. Document Revised: 10/24/2018 Document Reviewed: 10/24/2018 Elsevier Patient Education  2020 [employer].

## 2020-02-27 DIAGNOSIS — H903 Sensorineural hearing loss, bilateral: Secondary | ICD-10-CM | POA: Diagnosis not present

## 2020-03-02 ENCOUNTER — Ambulatory Visit: Payer: Medicare Other | Admitting: Licensed Clinical Social Worker

## 2020-03-02 DIAGNOSIS — M818 Other osteoporosis without current pathological fracture: Secondary | ICD-10-CM

## 2020-03-02 DIAGNOSIS — Z87898 Personal history of other specified conditions: Secondary | ICD-10-CM

## 2020-03-02 DIAGNOSIS — E890 Postprocedural hypothyroidism: Secondary | ICD-10-CM

## 2020-03-02 DIAGNOSIS — C716 Malignant neoplasm of cerebellum: Secondary | ICD-10-CM

## 2020-03-02 DIAGNOSIS — C801 Malignant (primary) neoplasm, unspecified: Secondary | ICD-10-CM

## 2020-03-02 NOTE — Patient Instructions (Addendum)
Licensed Clinical Social Worker Visit Information  Goals we discussed today:  Goals Addressed            This Visit's Progress   . Client or family representative to talk with LCSW in next 30 days to discuss client daily needs, ADLs neds, and needs regarding wheelchair ramp (pt-stated)       [place] (see longtitudinal plan of care for additional care plan information)  Current Barriers:  . Wheelchair bound client with Chronic Diagnoses of Medulloblastoma, Osteoporosis, , Hypothyroidism, Hx brain tumor, medullary carcinoma . Hearing deficits . Communication challenges  Clinical Social Work Clinical Goal(s):  . Client/family representative to talk with LCSW in next 30 days to discuss client daily needs, ADL needs and needs regarding wheelchair ramp  Interventions:   Talked with James Hoffman, aunt about client current needs  Talked with [name] about client needs related to wheelchair ramp  Talked with James Hoffman about ADTS contact, James Hoffman, related to ramp construction information  Talked with James Hoffman about ambulation needs of client  Talked with James Hoffman about weight issues of client and about appetite of client  Talked with James Hoffman about vision challenges of client  Talked with James Hoffman about RNCM support with CCM program  Talked with James Hoffman about [facility]	[place] as possible resource for client  Talked with James Hoffman about client relaxation techniques (listens to music, watches favorite TV shows, enjoys spending time with family members)  Talked with James Hoffman about DME equipment of client (has wheelchair, Recruitment consultant)  Talked with James Hoffman about upcoming client appointments   Talked with James Hoffman about transport needs of client  Patient Self Care Activities:  Feeds self with set up  Self Care Deficits Mobility deficits Wheelchair dependent  Initial goal documentation       Materials Provided: No  Follow Up Plan:  Follow Up Plan:LCSW to call client or James Hoffman in next 4 weeks to discuss client needs at that time  The patient/James Hoffman, aunt, verbalized understanding of instructions provided today and declined a print copy of patient instruction materials.   [name].[name] MSW, LCSW Licensed Clinical Social Worker [facility] Care [facility] [phone]

## 2020-03-02 NOTE — Chronic Care Management (AMB) (Signed)
Chronic Care Management    Clinical Social Work Follow Up Note  03/02/2020 Name: James Hoffman MRN: [id number] DOB: June 09, 1996  James Hoffman is a 24 y.o. year old male who is a primary care patient of [name], DO. The CCM team was consulted for assistance with [name]   Review of patient status, including review of consultants reports, other relevant assessments, and collaboration with appropriate care team members and the patient's provider was performed as part of comprehensive patient evaluation and provision of chronic care management services.    SDOH (Social Determinants of Health) assessments performed: Yes;risk of tobacco use; risk of depression; risk of stress; risk of social isolation; risk of physical inactivity    Office Visit from 02/24/2020 in [facility]  PHQ-9 Total Score  5     GAD 7 : Generalized Anxiety Score 02/20/2020  Nervous, Anxious, on Edge 1  Control/stop worrying 1  Worry too much - different things 1  Trouble relaxing 1  Restless 0  Easily annoyed or irritable 0  Afraid - awful might happen 0  Total GAD 7 Score 4  Anxiety Difficulty Somewhat difficult    Outpatient Encounter Medications as of 03/02/2020  Medication Sig  . Cholecalciferol 50 MCG (2000 UT) CAPS Take by mouth.  . clotrimazole-betamethasone (LOTRISONE) cream Apply 1 application topically 2 (two) times daily. x7-10  . levETIRAcetam (KEPPRA) 750 MG tablet Take 1,500 mg by mouth in the morning and at bedtime.  Marland Kitchen levothyroxine (SYNTHROID) 100 MCG tablet Take 1 tablet (100 mcg total) by mouth daily before breakfast.  . naproxen (NAPROSYN) 500 MG tablet TAKE 1 TABLET (500 MG TOTAL) BY MOUTH 2 (TWO) TIMES DAILY WITH A MEAL.  . predniSONE (DELTASONE) 5 MG tablet Take 1.5 tablets (7.5 mg total) by mouth daily with breakfast.  . Testosterone 20.25 MG/ACT (1.62%) GEL ONE PUM ON EACH SHOULDER EVERY MORNIG   No facility-administered encounter  medications on file as of 03/02/2020.     Goals Addressed            This Visit's Progress   . Client or family representative to talk with LCSW in next 30 days to discuss client daily needs, ADLs neds, and needs regarding wheelchair ramp (pt-stated)       [place] (see longtitudinal plan of care for additional care plan information)  Current Barriers:  . Wheelchair bound client with Chronic Diagnoses of Medulloblastoma, Osteoporosis, , Hypothyroidism, Hx brain tumor, medullary carcinoma . Hearing deficits . Communication challenges  Clinical Social Work Clinical Goal(s):  . Client/family representative to talk with LCSW in next 30 days to discuss client daily needs, ADL needs and needs regarding wheelchair ramp  Interventions: . Talked with [name], aunt about client current needs . Talked with [name] about client needs related to wheelchair ramp . Talked with [name] about ADTS contact, Waymond Cera, related to ramp construction information . Talked with [name] about ambulation needs of client . Talked with [name] about weight issues of client and about appetite of client . Talked with [name] about vision challenges of client . Talked with [name] about RNCM support with CCM program . Talked with [name] about [employer]	[place] as possible resource for client . Talked with [name] about client relaxation techniques (listens to music, watches favorite TV shows, enjoys spending time with family members) . Talked with [name] about DME equipment of client (has wheelchair, Recruitment consultant) . Talked with [name] about upcoming client appointments .  Talked with [name] about transport needs of client  Patient Self Care Activities:  Feeds self with set up  Self Care Deficits Mobility deficits Wheelchair dependent  Initial goal documentation     Follow Up Plan: LCSW to call client or [name] in next 4 weeks to discuss client  needs at that time  Brodan Ragosta.Hamda Klutts MSW, LCSW Licensed Clinical Social Worker [facility] [phone]

## 2020-03-09 DIAGNOSIS — C716 Malignant neoplasm of cerebellum: Secondary | ICD-10-CM | POA: Diagnosis not present

## 2020-03-09 DIAGNOSIS — I071 Rheumatic tricuspid insufficiency: Secondary | ICD-10-CM | POA: Diagnosis not present

## 2020-03-09 DIAGNOSIS — I081 Rheumatic disorders of both mitral and tricuspid valves: Secondary | ICD-10-CM | POA: Diagnosis not present

## 2020-03-12 ENCOUNTER — Ambulatory Visit: Payer: Medicare Other | Admitting: *Deleted

## 2020-03-15 DIAGNOSIS — H903 Sensorineural hearing loss, bilateral: Secondary | ICD-10-CM | POA: Diagnosis not present

## 2020-03-16 ENCOUNTER — Ambulatory Visit (INDEPENDENT_AMBULATORY_CARE_PROVIDER_SITE_OTHER): Payer: Medicare Other | Admitting: Licensed Clinical Social Worker

## 2020-03-16 DIAGNOSIS — Z87898 Personal history of other specified conditions: Secondary | ICD-10-CM

## 2020-03-16 DIAGNOSIS — C801 Malignant (primary) neoplasm, unspecified: Secondary | ICD-10-CM

## 2020-03-16 DIAGNOSIS — M818 Other osteoporosis without current pathological fracture: Secondary | ICD-10-CM

## 2020-03-16 DIAGNOSIS — E890 Postprocedural hypothyroidism: Secondary | ICD-10-CM

## 2020-03-16 DIAGNOSIS — Z23 Encounter for immunization: Secondary | ICD-10-CM | POA: Diagnosis not present

## 2020-03-16 DIAGNOSIS — C716 Malignant neoplasm of cerebellum: Secondary | ICD-10-CM

## 2020-03-16 NOTE — Chronic Care Management (AMB) (Signed)
Chronic Care Management    Clinical Social Work Follow Up Note  03/16/2020 Name: James Hoffman MRN: [id number] DOB: 26-Aug-1996  James Hoffman is a 24 y.o. year old male who is a primary care patient of James Norlander, DO. The CCM team was consulted for assistance with James Hoffman .   Review of patient status, including review of consultants reports, other relevant assessments, and collaboration with appropriate care team members and the patient's provider was performed as part of comprehensive patient evaluation and provision of chronic care management services.    SDOH (Social Determinants of Health) assessments performed: Yes; risk for social isolation; risk for tobacco use; risk for depression; risk for stress; risk for physical inactivity     Office Visit from 02/24/2020 in [place]  PHQ-9 Total Score  5     GAD 7 : Generalized Anxiety Score 02/20/2020  Nervous, Anxious, on Edge 1  Control/stop worrying 1  Worry too much - different things 1  Trouble relaxing 1  Restless 0  Easily annoyed or irritable 0  Afraid - awful might happen 0  Total GAD 7 Score 4  Anxiety Difficulty Somewhat difficult   Outpatient Encounter Medications as of 03/16/2020  Medication Sig  . Cholecalciferol 50 MCG (2000 UT) CAPS Take by mouth.  . clotrimazole-betamethasone (LOTRISONE) cream Apply 1 application topically 2 (two) times daily. x7-10  . levETIRAcetam (KEPPRA) 750 MG tablet Take 1,500 mg by mouth in the morning and at bedtime.  Marland Kitchen levothyroxine (SYNTHROID) 100 MCG tablet Take 1 tablet (100 mcg total) by mouth daily before breakfast.  . naproxen (NAPROSYN) 500 MG tablet TAKE 1 TABLET (500 MG TOTAL) BY MOUTH 2 (TWO) TIMES DAILY WITH A MEAL.  . predniSONE (DELTASONE) 5 MG tablet Take 1.5 tablets (7.5 mg total) by mouth daily with breakfast.  . Testosterone 20.25 MG/ACT (1.62%) GEL ONE PUM ON EACH SHOULDER EVERY MORNIG   No facility-administered encounter  medications on file as of 03/16/2020.    Goals    . Client or family representative to talk with LCSW in next 30 days to discuss client daily needs, ADLs neds, and needs regarding wheelchair ramp (pt-stated)     James Hoffman (see longtitudinal plan of care for additional care plan information)  Current Barriers:  . Wheelchair bound client with Chronic Diagnoses of Medulloblastoma, Osteoporosis, , Hypothyroidism, Hx brain tumor, medullary carcinoma . Hearing deficits . Communication challenges  Clinical Social Work Clinical Goal(s):  . Client/family representative to talk with LCSW in next 30 days to discuss client daily needs, ADL needs and needs regarding wheelchair ramp  Interventions: . Talked with James Hoffman, grandmother of client, about client relaxation techniques (listens to music, watches favorite TV shows, enjoys spending time with family members) . Talked with James Hoffman, grandmother of client, about current needs of client . Talked with James Hoffman about client's recent hospitalization . Talked with James Hoffman about client's vision needs  . Talked with James Hoffman about client's upcoming appointments . Talked with James Hoffman about social support network for client James Hoffman, client's father provides occasional support ) . Talked with James Hoffman about pain issues of client . Talked with James Hoffman about skin issues of client . Encouraged James Hoffman or James Hoffman to call James Hoffman as needed to discuss nursing needs of client  Patient Self Care Activities:  Feeds self with set up  Self Care Deficits Mobility deficits Wheelchair dependent  Initial goal documentation  Follow Up Plan:LCSW to call client or James Hoffman James Hoffman in next 4 weeks to discuss client needs at that time  James Hoffman.James Hoffman MSW, LCSW Licensed Clinical Social Worker James [facility] Care Management [phone]

## 2020-03-16 NOTE — Chronic Care Management (AMB) (Signed)
  Chronic Care Management   Outreach Note  03/12/2020 Name: James Hoffman MRN: [id number] DOB: 1996-10-09  Referred by: [name], DO Reason for referral : Chronic Care Management (Initial Visit)   An unsuccessful Initial Telephone visit by [facility] was attempted today. The patient was referred to the case management team for assistance with care management and care coordination.   Follow Up Plan: The care management team will reach out to the patient again over the next 30 days.   Chong Sicilian, BSN, RN-BC Embedded Chronic Care Manager [facility] / [place] Management Direct Dial: [phone]

## 2020-03-16 NOTE — Patient Instructions (Addendum)
Licensed Clinical Social Worker Visit Information  Goals we discussed today:  Goals    . Client or family representative to talk with LCSW in next 30 days to discuss client daily needs, ADLs needs, and needs regarding wheelchair ramp (pt-stated)      CARE PLAN ENTRY  Current Barriers:  . Wheelchair bound client with Chronic Diagnoses of Medulloblastoma, Osteoporosis, , Hypothyroidism, Hx brain tumor, medullary carcinoma . Hearing deficits . Communication challenges  Clinical Social Work Clinical Goal(s):  . Client/family representative to talk with LCSW in next 30 days to discuss client daily needs, ADL needs and needs regarding wheelchair ramp  Interventions:  Talked with James Hoffman, grandmother of client, about client relaxation techniques (listens to music, watches favorite TV shows, enjoys spending time with family members)  Talked with James Hoffman, grandmother of client, about current needs of client  Talked with James Hoffman about client's recent hospitalization  Talked with James Hoffman about client's vision needs  Talked with James Hoffman about client's upcoming appointments  Talked with James Hoffman about social support network for client James Hoffman, James Hoffman, client's father provides occasional support )  Talked with James Hoffman about pain issues of client  Talked with James Hoffman about skin issues of client  Encouraged James Hoffman or James Hoffman to call [facility] as needed to discuss nursing needs of client  Patient Self Care Activities:  Feeds self with set up  James Hoffman Deficits Mobility deficits Wheelchair dependent  Initial goal documentation       Materials Provided: No  Follow Up Plan:LCSW to call client or James Hoffman James Hoffman in next 4 weeks to discuss client needs at that time  The patient /James Hoffman, grandmother,verbalized understanding of instructions provided today and declined a print copy of patient instruction materials.    [name].[name] MSW, LCSW Licensed Clinical Social Worker [facility] Care Management [phone]

## 2020-03-26 DIAGNOSIS — L0231 Cutaneous abscess of buttock: Secondary | ICD-10-CM | POA: Diagnosis not present

## 2020-03-28 DIAGNOSIS — F17210 Nicotine dependence, cigarettes, uncomplicated: Secondary | ICD-10-CM | POA: Diagnosis not present

## 2020-03-28 DIAGNOSIS — L0231 Cutaneous abscess of buttock: Secondary | ICD-10-CM | POA: Diagnosis not present

## 2020-03-28 DIAGNOSIS — J984 Other disorders of lung: Secondary | ICD-10-CM | POA: Diagnosis not present

## 2020-03-28 DIAGNOSIS — R1111 Vomiting without nausea: Secondary | ICD-10-CM | POA: Diagnosis not present

## 2020-04-01 ENCOUNTER — Ambulatory Visit: Payer: Medicare Other | Admitting: Licensed Clinical Social Worker

## 2020-04-01 DIAGNOSIS — C716 Malignant neoplasm of cerebellum: Secondary | ICD-10-CM

## 2020-04-01 DIAGNOSIS — E890 Postprocedural hypothyroidism: Secondary | ICD-10-CM | POA: Diagnosis not present

## 2020-04-01 DIAGNOSIS — M818 Other osteoporosis without current pathological fracture: Secondary | ICD-10-CM

## 2020-04-01 DIAGNOSIS — Z87898 Personal history of other specified conditions: Secondary | ICD-10-CM

## 2020-04-01 DIAGNOSIS — C801 Malignant (primary) neoplasm, unspecified: Secondary | ICD-10-CM

## 2020-04-01 NOTE — Patient Instructions (Addendum)
Licensed Clinical Social Worker Visit Information  Goals we discussed today:  Goals    . Client or family representative to talk with LCSW in next 30 days to discuss client daily needs, ADLs neds, and needs regarding wheelchair ramp (pt-stated)     [place] (see longtitudinal plan of care for additional care plan information)  Current Barriers:  . Wheelchair bound client with Chronic Diagnoses of Medulloblastoma, Osteoporosis, , Hypothyroidism, Hx brain tumor, medullary carcinoma . Hearing deficits . Communication challenges  Clinical Social Work Clinical Goal(s):  . Client/family representative to talk with LCSW in next 30 days to discuss client daily needs, ADL needs and needs regarding wheelchair ramp  Interventions:  Previously encouraged [name] or [name] to call [facility] as needed to discuss nursing needs of client  Talked with [name] about skin care issues of client.  Talked with [name] about pain issues of client  Talked with [name] about upcoming client appointments (she said he has appointment at [facility] on Apr 06, 2020.  Talked with [name] about vision needs of client  Patient Self Care Activities:  Feeds self with set up  Self Care Deficits Mobility deficits Wheelchair dependent  Initial goal documentation     Materials Provided: No  Follow Up Plan:LCSW to call client or [name], in next 4 weeks to discuss client needs at that time  The patient /James Hoffman, verbalized understanding of instructions provided today and declined a print copy of patient instruction materials.   [name].Lakeesha Fontanilla MSW, LCSW Licensed Clinical Social Worker [facility] Care Management [phone]

## 2020-04-01 NOTE — Chronic Care Management (AMB) (Signed)
  Chronic Care Management    Clinical Social Work Follow Up Note  04/01/2020 Name: James Hoffman MRN: [id number] DOB: Oct 12, 1996  James Hoffman is a 24 y.o. year old male who is a primary care patient of [name], DO. The CCM team was consulted for assistance with [name]   Review of patient status, including review of consultants reports, other relevant assessments, and collaboration with appropriate care team members and the patient's provider was performed as part of comprehensive patient evaluation and provision of chronic care management services.    SDOH (Social Determinants of Health) assessments performed: Yes;risk for tobacco use;;risk for depression; risk for stress; risk for physical inactivity    Office Visit from 02/24/2020 in [place]  PHQ-9 Total Score  5     GAD 7 : Generalized Anxiety Score 02/20/2020  Nervous, Anxious, on Edge 1  Control/stop worrying 1  Worry too much - different things 1  Trouble relaxing 1  Restless 0  Easily annoyed or irritable 0  Afraid - awful might happen 0  Total GAD 7 Score 4  Anxiety Difficulty Somewhat difficult    Outpatient Encounter Medications as of 04/01/2020  Medication Sig  . Cholecalciferol 50 MCG (2000 UT) CAPS Take by mouth.  . clotrimazole-betamethasone (LOTRISONE) cream Apply 1 application topically 2 (two) times daily. x7-10  . levETIRAcetam (KEPPRA) 750 MG tablet Take 1,500 mg by mouth in the morning and at bedtime.  Marland Kitchen levothyroxine (SYNTHROID) 100 MCG tablet Take 1 tablet (100 mcg total) by mouth daily before breakfast.  . naproxen (NAPROSYN) 500 MG tablet TAKE 1 TABLET (500 MG TOTAL) BY MOUTH 2 (TWO) TIMES DAILY WITH A MEAL.  . predniSONE (DELTASONE) 5 MG tablet Take 1.5 tablets (7.5 mg total) by mouth daily with breakfast.  . Testosterone 20.25 MG/ACT (1.62%) GEL ONE PUM ON EACH SHOULDER EVERY MORNIG   No facility-administered encounter medications on file as of  04/01/2020.    Goals    . Client or family representative to talk with LCSW in next 30 days to discuss client daily needs, ADLs neds, and needs regarding wheelchair ramp (pt-stated)     CARE PLAN ENTRY  Current Barriers:  . Wheelchair bound client with Chronic Diagnoses of Medulloblastoma, Osteoporosis, , Hypothyroidism, Hx brain tumor, medullary carcinoma . Hearing deficits . Communication challenges  Clinical Social Work Clinical Goal(s):  . Client/family representative to talk with LCSW in next 30 days to discuss client daily needs, ADL needs and needs regarding wheelchair ramp  Interventions:  Previously encouraged James Hoffman or James Hoffman to call [facility] as needed to discuss nursing needs of client  Talked with James Hoffman about skin care issues of client.  Talked with James Hoffman about pain issues of client  Talked with James Hoffman about upcoming client appointments (she said he has appointment at [facility] on Apr 06, 2020.  Talked with James Hoffman about vision needs of client  Patient Self Care Activities:  Feeds self with set up  Self Care Deficits Mobility deficits Wheelchair dependent   Initial goal documentation      Follow Up Plan:LCSW to call client or James Hoffman, in next 4 weeks to discuss client needs at that time  James Hoffman.Bertice Risse MSW, LCSW Licensed Clinical Social Worker [facility] Care Management [phone]

## 2020-04-06 DIAGNOSIS — L0231 Cutaneous abscess of buttock: Secondary | ICD-10-CM | POA: Insufficient documentation

## 2020-04-08 ENCOUNTER — Ambulatory Visit: Payer: Medicare Other | Admitting: "Endocrinology

## 2020-04-18 ENCOUNTER — Other Ambulatory Visit: Payer: Self-pay | Admitting: Family Medicine

## 2020-04-20 ENCOUNTER — Ambulatory Visit (INDEPENDENT_AMBULATORY_CARE_PROVIDER_SITE_OTHER): Payer: Medicare Other | Admitting: Licensed Clinical Social Worker

## 2020-04-20 ENCOUNTER — Ambulatory Visit: Payer: Medicare Other | Admitting: *Deleted

## 2020-04-20 DIAGNOSIS — Z87898 Personal history of other specified conditions: Secondary | ICD-10-CM

## 2020-04-20 DIAGNOSIS — M818 Other osteoporosis without current pathological fracture: Secondary | ICD-10-CM

## 2020-04-20 DIAGNOSIS — E890 Postprocedural hypothyroidism: Secondary | ICD-10-CM

## 2020-04-20 DIAGNOSIS — C801 Malignant (primary) neoplasm, unspecified: Secondary | ICD-10-CM

## 2020-04-20 DIAGNOSIS — C716 Malignant neoplasm of cerebellum: Secondary | ICD-10-CM

## 2020-04-20 NOTE — Chronic Care Management (AMB) (Signed)
Chronic Care Management    Clinical Social Work Follow Up Note  04/20/2020 Name: James Hoffman MRN: [id number] DOB: Aug 15, 1996  James Hoffman is a 24 y.o. year old male who is a primary care patient of [name], DO. The CCM team was consulted for assistance with [name]   Review of patient status, including review of consultants reports, other relevant assessments, and collaboration with appropriate care team members and the patient's provider was performed as part of comprehensive patient evaluation and provision of chronic care management services.    SDOH (Social Determinants of Health) assessments performed: No;risk for social isolation; risk for tobacco use; risk for depression risk for stress; risk for physical inactivity     Office Visit from 02/24/2020 in [place]  PHQ-9 Total Score  5     GAD 7 : Generalized Anxiety Score 02/20/2020  Nervous, Anxious, on Edge 1  Control/stop worrying 1  Worry too much - different things 1  Trouble relaxing 1  Restless 0  Easily annoyed or irritable 0  Afraid - awful might happen 0  Total GAD 7 Score 4  Anxiety Difficulty Somewhat difficult   Outpatient Encounter Medications as of 04/20/2020  Medication Sig  . Cholecalciferol 50 MCG (2000 UT) CAPS Take by mouth.  . clotrimazole-betamethasone (LOTRISONE) cream Apply 1 application topically 2 (two) times daily. x7-10  . levETIRAcetam (KEPPRA) 750 MG tablet Take 1,500 mg by mouth in the morning and at bedtime.  Marland Kitchen levothyroxine (SYNTHROID) 100 MCG tablet Take 1 tablet (100 mcg total) by mouth daily before breakfast.  . naproxen (NAPROSYN) 500 MG tablet TAKE 1 TABLET (500 MG TOTAL) BY MOUTH 2 (TWO) TIMES DAILY WITH A MEAL.  . predniSONE (DELTASONE) 5 MG tablet Take 1.5 tablets (7.5 mg total) by mouth daily with breakfast.  . Testosterone 20.25 MG/ACT (1.62%) GEL ONE PUM ON EACH SHOULDER EVERY MORNIG   No facility-administered encounter  medications on file as of 04/20/2020.    Goals    . Client or family representative to talk with LCSW in next 30 days to discuss client daily needs, ADLs neds, and needs regarding wheelchair ramp (pt-stated)     [place] (see longtitudinal plan of care for additional care plan information)  Current Barriers:  . Wheelchair bound client with Chronic Diagnoses of Medulloblastoma, Osteoporosis, , Hypothyroidism, Hx brain tumor, medullary carcinoma . Hearing deficits . Communication challenges  Clinical Social Work Clinical Goal(s):  . Client/family representative to talk with LCSW in next 30 days to discuss client daily needs, ADL needs and needs regarding wheelchair ramp  Interventions:  Previously encouraged James Hoffman or James Hoffman to call [facility] as needed to discuss nursing needs of client  Talked with James Hoffman about skin care issues of client.  Talked with James Hoffman about pain issues of client  Talked with James Hoffman about vision needs of client  Talked with James Hoffman about appetite of client  Talked with James Hoffman about hearing needs of client  Talked with James Hoffman about transport needs of client  Talked with James Hoffman about supplies of family to help manage skin care needs of client  Talked with James Hoffman about client use of supplement  Talked with James Hoffman about incontinence needs of client  Collaborated with [facility] regarding nursing needs of client  Patient Self Care Activities:  Feeds self with set up  Self Care Deficits Mobility deficits Wheelchair dependent  Initial goal documentation      Follow Up  Plan:LCSW to call client or James Hoffman, in next 4 weeks to discuss client needs at that time  Troy Kanouse.Malin Sambrano MSW, LCSW Licensed Clinical Social Worker [facility] Care Management [phone]

## 2020-04-20 NOTE — Patient Instructions (Addendum)
Licensed Clinical Social Worker Visit Information  Goals we discussed today:    . Client or family representative to talk with LCSW in next 30 days to discuss client daily needs, ADLs neds, and needs regarding wheelchair ramp (pt-stated)     CARE PLAN ENTRY    Current Barriers:  Wheelchair bound client with Chronic Diagnoses of Medulloblastoma, Osteoporosis, , Hypothyroidism, Hx brain tumor, medullary carcinoma  Hearing deficits  Communication challenges  Clinical Social Work Clinical Goal(s):  Client/family representative to talk with LCSW in next 30 days to discuss client daily needs, ADL needs and needs regarding wheelchair ramp  Interventions:  Previously encouraged [name] or [name] to call [facility] as needed to discuss nursing needs of client  Talked with [name] about skin care issues of client.  Talked with [name] about pain issues of client  Talked with [name] about vision needs of client  Talked with [name] about appetite of client  Talked with [name] about hearing needs of client  Talked with [name] about transport needs of client  Talked with [name] about supplies of family to help manage skin care needs of client  Talked with [name] about client use of supplement  Talked with [name] about incontinence needs of client  Collaborated with RNCM regarding nursing needs of client  Patient Self Care Activities:  Feeds self with set up   Self Care Deficits  Mobility deficits  Wheelchair dependent  Initial goal documentation      Follow Up Plan: LCSW to call client or [name], in next 4 weeks to discuss client needs at that time   Materials Provided: No  The patient/James Hoffman, grandmother, verbalized understanding of instructions provided today and declined a print copy of patient instruction materials.   [name].[name] MSW, LCSW Licensed Clinical Social Worker [facility] Care  Management [phone]

## 2020-04-21 NOTE — Chronic Care Management (AMB) (Signed)
  Chronic Care Management   Care Coordination Note  04/20/2020 Name: SPIKE DESILETS MRN: [id number] DOB: 02/12/1996  I was consulted by Theadore Nan, LCSW who spoke with patient's family member earlier today. Mr Nawrot has an abscess on his right buttocks and is being followed by general surgeon with [facility] in [employer].   The following notes are available for review in EMR. His last visit was on 04/06/2020.  "He is seen in followup for a buttock abscess on the right hand side. He had a Penrose drain placed last time and this appears to be doing well. He has had no significant redness or no significant cellulitis. He does have a little redness which I think is a tape reaction, but no ongoing drainage of purulence. The wound on the inside that I can see looks healthy and granulating. He has no systemic symptoms. All in all, he is doing quite well. I have refilled a Motrin and Norco prescription. He is going to follow up with [place] in 2 weeks, at which time I hope we can remove his Penrose drain."  He was scheduled for a follow-up today but had to reschedule due to a family emergency. His appointment has been rescheduled for 04/27/2020. His family provides total care for him and transports him to appointments. At this time it seems that they prefer to manage his care. PCS services are available to him and would likely be beneficial if they chose to take advantage of them. Scott relayed that Mr Carino may run out of pain medication since he missed his visit with the general surgeon today.   Follow up plan:  Patient/family should follow-up with general surgeon regarding pain medication refills  RN initial visit scheduled for 04/28/20  Neurology follow-up on 04/22/20  Dr Lajuana Ripple follow-up on 04/30/2020  Chong Sicilian, BSN, RN-BC [place] / [place] Management Direct Dial: [phone]

## 2020-04-22 DIAGNOSIS — C716 Malignant neoplasm of cerebellum: Secondary | ICD-10-CM | POA: Diagnosis not present

## 2020-04-22 DIAGNOSIS — G9340 Encephalopathy, unspecified: Secondary | ICD-10-CM | POA: Diagnosis not present

## 2020-04-23 DIAGNOSIS — E2740 Unspecified adrenocortical insufficiency: Secondary | ICD-10-CM | POA: Diagnosis not present

## 2020-04-23 DIAGNOSIS — L0231 Cutaneous abscess of buttock: Secondary | ICD-10-CM | POA: Diagnosis not present

## 2020-04-23 DIAGNOSIS — C716 Malignant neoplasm of cerebellum: Secondary | ICD-10-CM | POA: Diagnosis not present

## 2020-04-23 DIAGNOSIS — Z4803 Encounter for change or removal of drains: Secondary | ICD-10-CM | POA: Diagnosis not present

## 2020-04-28 ENCOUNTER — Ambulatory Visit: Payer: Medicare Other | Admitting: *Deleted

## 2020-04-28 DIAGNOSIS — H5410 Blindness, one eye, low vision other eye, unspecified eyes: Secondary | ICD-10-CM

## 2020-04-28 DIAGNOSIS — E890 Postprocedural hypothyroidism: Secondary | ICD-10-CM

## 2020-04-28 NOTE — Patient Instructions (Signed)
RN Care Plan   . "We want this abscess to heal"       CARE PLAN ENTRY (see longitudinal plan of care for additional care plan information)  Current Barriers:  . Care Coordination needs related to gluteal abscess in a patient with hypothyroidism, hypotension, hx of medullary carcinoma, muscle deconditioning (disease states)  Nurse Case Manager Clinical Goal(s):  Marland Kitchen Over the next 30 days, patient will continue to see wound healing . Over the next 30 days, patient/family will reach out to general surgeon with any new or worsening symptoms  Interventions:  . Inter-disciplinary care team collaboration (see longitudinal plan of care) . Chart reviewed including recent office notes and surgical notes o 04/23/20 General Surgeon notes from [facility] reviewed regarding gluteal abscess . Collaborated with LCSW . Talked with grandmother, [name], who is one of his primary caregivers o Wound is healing nicely o She is changing the dressing twice daily and PRN o Keeping the area dry for the most part. Patient does have some difficulty with using the handheld urinal at night and sometimes soils his bed linens . Discussed caregiver strain. Grandmother has help from other family members o Reports that they are managing well at this time and that they do not need any outside assistance . Discussed ambulation challenges o Can walk with great assistance. Has wheelchair that they can use as needed.  . Encouraged them to f/u with general surgeon with any new or worsening symptoms . Provided with CCM contact information and encouraged to reach out as needed  Patient Self Care Activities:  . Can assist with bathing and toileting . Needs assistance with transportation, walking, and other IADLs  Initial goal documentation         Plan:   Telephone follow up appointment with care management team member scheduled for:05/10/20 F/u with general surgeon as needed  Chong Sicilian, BSN, RN-BC [place] / [facility] Direct Dial: [phone]

## 2020-04-28 NOTE — Chronic Care Management (AMB) (Signed)
Chronic Care Management   Follow Up Note   04/28/2020 Name: KAHIAU SCHEWE MRN: [id number] DOB: August 17, 1996  Referred by: [name], DO Reason for referral : Chronic Care Management (RN Initial Visit)   RYLON POITRA is a 24 y.o. year old male who is a primary care patient of [name], DO. The CCM team was consulted for assistance with chronic disease management and care coordination needs.    Review of patient status, including review of consultants reports, relevant laboratory and other test results, and collaboration with appropriate care team members and the patient's provider was performed as part of comprehensive patient evaluation and provision of chronic care management services.    I spoke with Chancellor's grandmother, [name], who is his primary caregiver regarding management of his chronic medical conditions and of his current gluteal abscess.   SDOH (Social Determinants of Health) assessments performed: Yes See Care Plan activities for detailed interventions related to [facility])     Outpatient Encounter Medications as of 04/28/2020  Medication Sig  . Cholecalciferol 50 MCG (2000 UT) CAPS Take by mouth.  . clotrimazole-betamethasone (LOTRISONE) cream Apply 1 application topically 2 (two) times daily. x7-10  . levETIRAcetam (KEPPRA) 750 MG tablet Take 1,500 mg by mouth in the morning and at bedtime.  Marland Kitchen levothyroxine (SYNTHROID) 100 MCG tablet Take 1 tablet (100 mcg total) by mouth daily before breakfast.  . naproxen (NAPROSYN) 500 MG tablet TAKE 1 TABLET (500 MG TOTAL) BY MOUTH 2 (TWO) TIMES DAILY WITH A MEAL.  . predniSONE (DELTASONE) 5 MG tablet Take 1.5 tablets (7.5 mg total) by mouth daily with breakfast.  . Testosterone 20.25 MG/ACT (1.62%) GEL ONE PUM ON EACH SHOULDER EVERY MORNIG   No facility-administered encounter medications on file as of 04/28/2020.      RN Care Plan   . "We want this abscess to heal"       CARE PLAN ENTRY (see  longitudinal plan of care for additional care plan information)  Current Barriers:  . Care Coordination needs related to gluteal abscess in a patient with hypothyroidism, blindness/low vision, hypotension, hx of medullary carcinoma, muscle deconditioning (disease states)  Nurse Case Manager Clinical Goal(s):  Marland Kitchen Over the next 30 days, patient will continue to see wound healing . Over the next 30 days, patient/family will reach out to general surgeon with any new or worsening symptoms  Interventions:  . Inter-disciplinary care team collaboration (see longitudinal plan of care) . Chart reviewed including recent office notes and surgical notes o 04/23/20 General Surgeon notes from [facility] reviewed regarding gluteal abscess . Collaborated with LCSW . Talked with grandmother, [name], who is one of his primary caregivers o Wound is healing nicely o She is changing the dressing twice daily and PRN o Keeping the area dry for the most part. Patient does have some difficulty with using the handheld urinal at night and sometimes soils his bed linens . Discussed caregiver strain. Grandmother has help from other family members o Reports that they are managing well at this time and that they do not need any outside assistance . Discussed ambulation challenges o Can walk with great assistance. Has wheelchair that they can use as needed.  . Encouraged them to f/u with general surgeon with any new or worsening symptoms . Provided with CCM contact information and encouraged to reach out as needed  Patient Self Care Activities:  . Can assist with bathing and toileting . Needs assistance with transportation, walking, and other IADLs  Initial  goal documentation         Plan:   Telephone follow up appointment with care management team member scheduled for:05/10/20 F/u with general surgeon as needed  Chong Sicilian, BSN, RN-BC [place] / [place]  Management Direct Dial: [phone]

## 2020-04-30 ENCOUNTER — Ambulatory Visit: Payer: Medicare Other | Admitting: Family Medicine

## 2020-05-04 ENCOUNTER — Encounter: Payer: Self-pay | Admitting: Family Medicine

## 2020-05-07 ENCOUNTER — Ambulatory Visit: Payer: Medicare Other | Admitting: Licensed Clinical Social Worker

## 2020-05-07 DIAGNOSIS — Z87898 Personal history of other specified conditions: Secondary | ICD-10-CM

## 2020-05-07 DIAGNOSIS — M818 Other osteoporosis without current pathological fracture: Secondary | ICD-10-CM

## 2020-05-07 DIAGNOSIS — C801 Malignant (primary) neoplasm, unspecified: Secondary | ICD-10-CM

## 2020-05-07 DIAGNOSIS — E890 Postprocedural hypothyroidism: Secondary | ICD-10-CM

## 2020-05-07 DIAGNOSIS — I9589 Other hypotension: Secondary | ICD-10-CM

## 2020-05-07 DIAGNOSIS — C716 Malignant neoplasm of cerebellum: Secondary | ICD-10-CM

## 2020-05-07 NOTE — Patient Instructions (Addendum)
Licensed Clinical Social Worker Visit Information  Goals we discussed today:    .  Client or family representative to talk with LCSW in next 30 days to discuss client daily needs, ADLs neds, and needs regarding wheelchair ramp (pt-stated)         CARE PLAN ENTRY   Current Barriers:   Wheelchair bound client with Chronic Diagnoses of Medulloblastoma, Osteoporosis, , Hypothyroidism, Hx brain tumor, medullary carcinoma  Hearing deficits  Communication challenges  Clinical Social Work Clinical Goal(s):   Client/family representative to talk with LCSW in next 30 days to discuss client daily needs, ADL needs and needs regarding wheelchair ramp  Interventions:  Previously encouraged [name] or [name] to call RNCM as needed to discuss nursing needs of client  Talked with [name] about skin care issues of client.  Talked with [name] about pain issues of client  Talked with [name] about vision needs of client  Talked with [name] about appetite of client  Talked with [name] about hearing needs of client  Talked with [name] about transport needs of client  Talked with [name] about client use of supplement  Talked with [name] about incontinence needs of client  LCSW talked with [name] about client use of soft drinks  Talked with [name] about client's upcoming appointments  Collaborated with [name] regarding nursing needs of client  Patient Self Care Activities:  Feeds self with set up  Self Care Deficits Mobility deficits Wheelchair dependent  Initial goal documentation       Follow Up Plan: LCSW to call client James Hoffman,in next 4 weeks to discuss client needs at that time  Materials Provided: No  The patient /James Hoffman, grandmother, verbalized understanding of instructions provided today and declined a print copy of patient instruction materials.   [name].Boen Sterbenz MSW, LCSW Licensed Clinical Social  Worker [facility] Care Management [phone]

## 2020-05-07 NOTE — Chronic Care Management (AMB) (Signed)
Chronic Care Management    Clinical Social Work Follow Up Note  05/07/2020 Name: James Hoffman MRN: [id number] DOB: [birth year]  James Hoffman is a 24 y.o. year old male who is a primary care patient of James Norlander, DO. The CCM team was consulted for assistance with James Hoffman .   Review of patient status, including review of consultants reports, other relevant assessments, and collaboration with appropriate care team members and the patient's provider was performed as part of comprehensive patient evaluation and provision of chronic care management services.    SDOH (Social Determinants of Health) assessments performed: No;risk for social isolation; risk for tobacco use; risk for depression; risk for stress; risk for physical inactivity    Office Visit from 02/24/2020 in [place]  PHQ-9 Total Score 5     GAD 7 : Generalized Anxiety Score 02/20/2020  Nervous, Anxious, on Edge 1  Control/stop worrying 1  Worry too much - different things 1  Trouble relaxing 1  Restless 0  Easily annoyed or irritable 0  Afraid - awful might happen 0  Total GAD 7 Score 4  Anxiety Difficulty Somewhat difficult    Outpatient Encounter Medications as of 05/07/2020  Medication Sig  . Cholecalciferol 50 MCG (2000 UT) CAPS Take by mouth.  . clotrimazole-betamethasone (LOTRISONE) cream Apply 1 application topically 2 (two) times daily. x7-10  . levETIRAcetam (KEPPRA) 750 MG tablet Take 1,500 mg by mouth in the morning and at bedtime.  Marland Kitchen levothyroxine (SYNTHROID) 100 MCG tablet Take 1 tablet (100 mcg total) by mouth daily before breakfast.  . naproxen (NAPROSYN) 500 MG tablet TAKE 1 TABLET (500 MG TOTAL) BY MOUTH 2 (TWO) TIMES DAILY WITH A MEAL.  . predniSONE (DELTASONE) 5 MG tablet Take 1.5 tablets (7.5 mg total) by mouth daily with breakfast.  . Testosterone 20.25 MG/ACT (1.62%) GEL ONE PUM ON EACH SHOULDER EVERY MORNIG   No facility-administered encounter  medications on file as of 05/07/2020.    Goals    .  Client or family representative to talk with LCSW in next 30 days to discuss client daily needs, ADLs neds, and needs regarding wheelchair ramp (pt-stated)      CARE PLAN ENTRY   Current Barriers:  . Wheelchair bound client with Chronic Diagnoses of Medulloblastoma, Osteoporosis, , Hypothyroidism, Hx brain tumor, medullary carcinoma . Hearing deficits . Communication challenges  Clinical Social Work Clinical Goal(s):  . Client/family representative to talk with LCSW in next 30 days to discuss client daily needs, ADL needs and needs regarding wheelchair ramp  Interventions:  Previously encouraged James Hoffman or James Hoffman to call [facility] as needed to discuss nursing needs of client  Talked with James Hoffman about skin care issues of client.  Talked with James Hoffman about pain issues of client  Talked with James Hoffman about vision needs of client  Talked with James Hoffman about appetite of client  Talked with James Hoffman about hearing needs of client  Talked with James Hoffman about transport needs of client  Talked with James Hoffman about client use of supplement  Talked with James Hoffman about incontinence needs of client  LCSW talked with James Hoffman about client use of soft drinks  Talked with James Hoffman about client's upcoming appointments  Collaborated with RNCM regarding nursing needs of client  Patient Self Care Activities:  Feeds self with set up  Self Care Deficits Mobility deficits Wheelchair dependent  Initial goal documentation       Follow Up Plan: LCSW to call  client James Hoffman,in next 4 weeks to discuss client needs at that time  James Hoffman MSW, LCSW Licensed Clinical Social Worker [facility] Care Management [phone]

## 2020-05-10 ENCOUNTER — Ambulatory Visit: Payer: Medicare Other | Admitting: *Deleted

## 2020-05-10 DIAGNOSIS — M818 Other osteoporosis without current pathological fracture: Secondary | ICD-10-CM | POA: Diagnosis not present

## 2020-05-10 DIAGNOSIS — C716 Malignant neoplasm of cerebellum: Secondary | ICD-10-CM

## 2020-05-10 DIAGNOSIS — E890 Postprocedural hypothyroidism: Secondary | ICD-10-CM | POA: Diagnosis not present

## 2020-05-10 DIAGNOSIS — R29898 Other symptoms and signs involving the musculoskeletal system: Secondary | ICD-10-CM

## 2020-05-10 NOTE — Chronic Care Management (AMB) (Signed)
Chronic Care Management   Follow Up Note   05/10/2020 Name: James Hoffman MRN: [id number] DOB: 1996/04/09  Referred by: [name], DO Reason for referral : Chronic Care Management (RN follow up)   James Hoffman is a 24 y.o. year old male who is a primary care patient of [name], DO. The CCM team was consulted for assistance with chronic disease management and care coordination needs.    Review of patient status, including review of consultants reports, relevant laboratory and other test results, and collaboration with appropriate care team members and the patient's provider was performed as part of comprehensive patient evaluation and provision of chronic care management services.    SDOH (Social Determinants of Health) assessments performed: Yes See Care Plan activities for detailed interventions related to [facility])     Outpatient Encounter Medications as of 05/10/2020  Medication Sig  . Cholecalciferol 50 MCG (2000 UT) CAPS Take by mouth.  . clotrimazole-betamethasone (LOTRISONE) cream Apply 1 application topically 2 (two) times daily. x7-10  . levETIRAcetam (KEPPRA) 750 MG tablet Take 1,500 mg by mouth in the morning and at bedtime.  Marland Kitchen levothyroxine (SYNTHROID) 100 MCG tablet Take 1 tablet (100 mcg total) by mouth daily before breakfast.  . naproxen (NAPROSYN) 500 MG tablet TAKE 1 TABLET (500 MG TOTAL) BY MOUTH 2 (TWO) TIMES DAILY WITH A MEAL.  . predniSONE (DELTASONE) 5 MG tablet Take 1.5 tablets (7.5 mg total) by mouth daily with breakfast.  . Testosterone 20.25 MG/ACT (1.62%) GEL ONE PUM ON EACH SHOULDER EVERY MORNIG   No facility-administered encounter medications on file as of 05/10/2020.     RN Care Plan   .  Grandmother: "I'm worried that he's not eating enough" (pt-stated)        CARE PLAN ENTRY (see longitudinal plan of care for additional care plan information)  Current Barriers:  . Care Coordination needs related to nutrition/adequate  food intake in a patient with hypothyroidism and medulloblastoma  (disease states) . Poor appetite . Weight loss over the past 6 months . Limited mobility and physical activity . Particular about what foods he eats  Nurse Case Manager Clinical Goal(s):  Marland Kitchen Over the next 30 days, patient will work with PCP re: decreased appetite . Over the next 45 days, patient/family will talk with [facility] regarding decreased appetite . Over the next 60 days, patient will see improvement in appetite and nutritional intake  Interventions:  . Inter-disciplinary care team collaboration (see longitudinal plan of care) . Chart reviewed  . Collaborated with LCSW . Spoke with patient's grandmother who is is primary caregiver . Discussed current diet and food intake o Patient prefers eating out to home cooked foods o She provides meals and high calorie snacks (ice cream, etc) . Discussed that patient has mentioned that he doesn't want to gain weight o Patient stated today that he just gets full quickly . Discussed nutritional supplements . Discussed possible medical management with [facility] . Discussed depression as possible cause for decreased appetite . Reviewed upcoming appointments: PCP office 05/13/20 to f/u on abscess o Added appt note to discuss decreased appetite . Encouraged patient/ family to reach out to [facility] team as needed  Patient Self Care Activities:  . Unable to perform ADLs independently . Unable to perform IADLs independently  Initial goal documentation     .  "We want this abscess to heal"   On track     [place] (see longitudinal plan of care for additional  care plan information)  Current Barriers:  . Care Coordination needs related to gluteal abscess in a patient with hypothyroidism, hypotension, blindness/low vision, hx of medullary carcinoma, muscle deconditioning (disease states)  Nurse Case Manager Clinical Goal(s):  Marland Kitchen Over the next 30 days, patient will continue to see wound  healing . Over the next 30 days, patient/family will reach out to general surgeon with any new or worsening symptoms  Interventions:  . Inter-disciplinary care team collaboration (see longitudinal plan of care) . Chart reviewed including recent office notes and surgical notes . Collaborated with LCSW . Talked with grandmother, [name], who is one of his primary caregivers o Wound is healing nicely o She is changing the dressing twice daily and PRN o She is able to keep the area clean and dry.  o He is no longer wetting the bed at night. He's been able to use the handheld urinal properly. . Discussed appetite and nutrition concerns. See additional goal. . Encouraged them to f/u with general surgeon with any new or worsening symptoms . Reviewed upcoming appts: PCP office 05/13/20  Patient Self Care Activities:  . Can assist with bathing and toileting . Needs assistance with transportation, walking, and other IADLs  Please see past updates related to this goal by clicking on the "Past Updates" button in the selected goal          Plan:  The care management team will reach out to the patient again over the next 45 days.    Chong Sicilian, BSN, RN-BC Embedded Chronic Care Manager [facility] / [place] Management Direct Dial: [phone]

## 2020-05-10 NOTE — Patient Instructions (Signed)
Visit Information  Goals Addressed              This Visit's Progress     Patient Stated   .  Grandmother: "I'm worried that he's not eating enough" (pt-stated)        CARE PLAN ENTRY (see longitudinal plan of care for additional care plan information)  Current Barriers:  . Care Coordination needs related to nutrition/adequate food intake in a patient with hypothyroidism and medulloblastoma  (disease states) . Poor appetite . Weight loss over the past 6 months . Limited mobility and physical activity . Particular about what foods he eats  Nurse Case Manager Clinical Goal(s):  Marland Kitchen Over the next 30 days, patient will work with PCP re: decreased appetite . Over the next 45 days, patient/family will talk with [facility] regarding decreased appetite . Over the next 60 days, patient will see improvement in appetite and nutritional intake  Interventions:  . Inter-disciplinary care team collaboration (see longitudinal plan of care) . Chart reviewed  . Collaborated with LCSW . Spoke with patient's grandmother who is is primary caregiver . Discussed current diet and food intake o Patient prefers eating out to home cooked foods o She provides meals and high calorie snacks (ice cream, etc) . Discussed that patient has mentioned that he doesn't want to gain weight o Patient stated today that he just gets full quickly . Discussed nutritional supplements . Discussed possible medical management with [facility] . Discussed depression as possible cause for decreased appetite . Reviewed upcoming appointments: PCP office 05/13/20 to f/u on abscess o Added appt note to discuss decreased appetite . Encouraged patient/ family to reach out to [place] team as needed  Patient Self Care Activities:  . Unable to perform ADLs independently . Unable to perform IADLs independently  Initial goal documentation       Other   .  "We want this abscess to heal"   On track     [place] (see longitudinal plan  of care for additional care plan information)  Current Barriers:  . Care Coordination needs related to gluteal abscess in a patient with hypothyroidism, hypotension, blindness/low vision, hx of medullary carcinoma, muscle deconditioning (disease states)  Nurse Case Manager Clinical Goal(s):  Marland Kitchen Over the next 30 days, patient will continue to see wound healing . Over the next 30 days, patient/family will reach out to general surgeon with any new or worsening symptoms  Interventions:  . Inter-disciplinary care team collaboration (see longitudinal plan of care) . Chart reviewed including recent office notes and surgical notes . Collaborated with LCSW . Talked with grandmother, [name], who is one of his primary caregivers o Wound is healing nicely o She is changing the dressing twice daily and PRN o She is able to keep the area clean and dry.  o He is no longer wetting the bed at night. He's been able to use the handheld urinal properly. . Discussed appetite and nutrition concerns. See additional goal. . Encouraged them to f/u with general surgeon with any new or worsening symptoms . Reviewed upcoming appts: PCP office 05/13/20  Patient Self Care Activities:  . Can assist with bathing and toileting . Needs assistance with transportation, walking, and other IADLs  Please see past updates related to this goal by clicking on the "Past Updates" button in the selected goal         Patient verbalizes understanding of instructions provided today.   Follow-up Plan The care management team will reach out  to the patient again over the next 45 days.  Next PCP appointment scheduled for: 05/13/20  Chong Sicilian, BSN, RN-BC [place] / [place] Management Direct Dial: [phone]

## 2020-05-11 ENCOUNTER — Other Ambulatory Visit: Payer: Self-pay | Admitting: Family Medicine

## 2020-05-11 DIAGNOSIS — G40909 Epilepsy, unspecified, not intractable, without status epilepticus: Secondary | ICD-10-CM

## 2020-05-13 ENCOUNTER — Telehealth: Payer: Self-pay | Admitting: Family Medicine

## 2020-05-13 ENCOUNTER — Ambulatory Visit: Payer: Medicare Other | Admitting: Nurse Practitioner

## 2020-05-13 NOTE — Telephone Encounter (Signed)
Attempted to contact - NVM

## 2020-05-13 NOTE — Telephone Encounter (Signed)
Pts caregiver would like to see if cream for a rash on pts bottom can be sent in. [facility].

## 2020-05-14 ENCOUNTER — Other Ambulatory Visit: Payer: Self-pay | Admitting: Family Medicine

## 2020-05-14 ENCOUNTER — Encounter: Payer: Self-pay | Admitting: Family Medicine

## 2020-05-14 ENCOUNTER — Ambulatory Visit: Payer: Medicare Other | Admitting: *Deleted

## 2020-05-14 DIAGNOSIS — L2489 Irritant contact dermatitis due to other agents: Secondary | ICD-10-CM

## 2020-05-14 MED ORDER — CLOTRIMAZOLE-BETAMETHASONE 1-0.05 % EX CREA: 1.0000 "application " | TOPICAL_CREAM | Freq: Two times a day (BID) | CUTANEOUS | 0 refills | Status: DC

## 2020-05-14 NOTE — Telephone Encounter (Signed)
Requesting refill

## 2020-05-14 NOTE — Chronic Care Management (AMB) (Signed)
  Chronic Care Management   Follow-up Outreach  05/14/2020 Name: James Hoffman MRN: [id number] DOB: [birth year]  Referred by: [name], DO Reason for referral : Chronic Care Management (RN follow up)   An unsuccessful telephone follow-up was attempted today. The patient was referred to the case management team for assistance with care management and care coordination. James Hoffman no showed for his appointment yesterday with PCP office but it has been rescheduled for 05/20/20.  Follow Up Plan: The care management team will reach out to the patient again over the next 15 days.   Chong Sicilian, BSN, RN-BC Embedded Chronic Care Manager [facility] / [place] Management Direct Dial: [phone]

## 2020-05-16 ENCOUNTER — Other Ambulatory Visit: Payer: Self-pay | Admitting: Family Medicine

## 2020-05-20 ENCOUNTER — Encounter: Payer: Self-pay | Admitting: Nurse Practitioner

## 2020-05-20 ENCOUNTER — Other Ambulatory Visit: Payer: Self-pay

## 2020-05-20 ENCOUNTER — Ambulatory Visit (INDEPENDENT_AMBULATORY_CARE_PROVIDER_SITE_OTHER): Payer: Medicare Other | Admitting: Nurse Practitioner

## 2020-05-20 VITALS — BP 95/71 | HR 113 | Temp 96.5°F | Ht 70.0 in | Wt 145.0 lb

## 2020-05-20 DIAGNOSIS — F322 Major depressive disorder, single episode, severe without psychotic features: Secondary | ICD-10-CM | POA: Diagnosis not present

## 2020-05-20 DIAGNOSIS — Z5189 Encounter for other specified aftercare: Secondary | ICD-10-CM

## 2020-05-20 MED ORDER — ENSURE COMPLETE PO LIQD: 237.0000 mL | Freq: Two times a day (BID) | ORAL | 10 refills | Status: DC

## 2020-05-20 MED ORDER — ESCITALOPRAM OXALATE 5 MG PO TABS: 5.0000 mg | ORAL_TABLET | Freq: Every day | ORAL | 0 refills | Status: DC

## 2020-05-20 NOTE — Assessment & Plan Note (Signed)
Patient is a 24 year old male who presents to clinic with rash/wound on his left buttock. The wound is gradually improving since onset. The rash is characterized by blistering and redness. It is unknown if there was an exposure to a precipitant. Past treatments include topical steroids. Treatment provided mild relief. Visit information provided by patient's aunt. Wound cultures collected. Wound care education provided with printed handouts given to primary caregiver. Follow-up with worsening or unresolved symptoms.

## 2020-05-20 NOTE — Assessment & Plan Note (Signed)
Patient presents with depression, he complains of trouble falling asleep, and loss of appetite. onset was approximately few months ago per caregiver. Per caregiver patient reports thoughts that he will be better off dead or hurting himself in some way. Family history is not significant for depression. Possible organic causes contributing to our current illness. Risk factors: Patient is not on any current medication or treatment for depression and anxiety. Education provided to patient caregiver with printed handout given. Patient started on SSRI. Rx sent to pharmacy. Follow-up in 4 to 6 weeks.

## 2020-05-20 NOTE — Progress Notes (Signed)
Established Patient Office Visit  Subjective:  Patient ID: James Hoffman, male    DOB: August 01, 1996  Age: 24 y.o. MRN: [id number]  CC:  Chief Complaint  Patient presents with  . Depression  . Rash    BUTTOCK    Rash This is a recurrent problem. The problem has been gradually improving since onset. The affected locations include the right buttock. The rash is characterized by blistering and redness. It is unknown if there was an exposure to a precipitant. Past treatments include topical steroids. The treatment provided mild relief.    HPI James Hoffman presents Depression: Patient complains of depression. He complains of trouble falling asleep, onset was approximately few months ago Per caregiver. Per caregiver he reports thoughts that he will be better off dead or hurting himself in some way.   Family history is not significant for depression. Possible organic causes contributing are: Current illness. Risk factors: Patient is not on any current medication or treatment for depression and anxiety.  Past Medical History:  Diagnosis Date  . Adrenal insufficiency ([place])   . 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 Hydrocephalus ([place])   . Osteoporosis   . Thyroid disease     Past Surgical History:  Procedure Laterality Date  . brain tumor removal  August 2012  . EYE SURGERY Right 2014   cataract removed  . HIP SURGERY    . teeth removal      History reviewed. No pertinent family history.  Social History   Socioeconomic History  . Marital status: Single    Spouse name: Not on file  . Number of children: 0  . Years of education: 7th grade  . Highest education level: 7th grade  Occupational History  . Occupation: disabled  Tobacco Use  . Smoking status: Current Every Day Smoker    Packs/day: 0.25    Years: 6.00    Pack years: 1.50    Types: Cigarettes    Start date: 11/25/2012  . Smokeless tobacco: Never Used  Vaping Use  . Vaping Use: Never used    Substance and Sexual Activity  . Alcohol use: No  . Drug use: No  . Sexual activity: Not Currently  Other Topics Concern  . Not on file  Social History Narrative  . Not on file   Social Determinants of Health   Financial Resource Strain: Low Risk   . Difficulty of Paying Living Expenses: Not very hard  Food Insecurity: No Food Insecurity  . Worried About Charity fundraiser in the Last Year: Never true  . Ran Out of Food in the Last Year: Never true  Transportation Needs: No Transportation Needs  . Lack of Transportation (Medical): No  . Lack of Transportation (Non-Medical): No  Physical Activity: Inactive  . Days of Exercise per Week: 0 days  . Minutes of Exercise per Session: 0 min  Stress: Stress Concern Present  . Feeling of Stress : To some extent  Social Connections: Socially Isolated  . Frequency of Communication with Friends and Family: More than three times a week  . Frequency of Social Gatherings with Friends and Family: More than three times a week  . Attends Religious Services: Never  . Active Member of Clubs or Organizations: No  . Attends Archivist Meetings: Never  . Marital Status: Never married  Intimate Partner Violence: Not At Risk  . Fear of Current or Ex-Partner: No  . Emotionally Abused: No  .  Physically Abused: No  . Sexually Abused: No    Outpatient Medications Prior to Visit  Medication Sig Dispense Refill  . Cholecalciferol 50 MCG (2000 UT) CAPS Take by mouth.    . clotrimazole-betamethasone (LOTRISONE) cream Apply 1 application topically 2 (two) times daily. x7-10 30 g 0  . levETIRAcetam (KEPPRA) 750 MG tablet Take 1,500 mg by mouth in the morning and at bedtime.    Marland Kitchen levothyroxine (SYNTHROID) 100 MCG tablet Take 1 tablet (100 mcg total) by mouth daily before breakfast. 95 tablet 3  . naproxen (NAPROSYN) 500 MG tablet TAKE 1 TABLET (500 MG TOTAL) BY MOUTH 2 (TWO) TIMES DAILY WITH A MEAL. 60 tablet 0  . predniSONE (DELTASONE) 5 MG  tablet Take 1.5 tablets (7.5 mg total) by mouth daily with breakfast.    . Testosterone 20.25 MG/ACT (1.62%) GEL ONE PUM ON EACH SHOULDER EVERY MORNIG 75 g 1   No facility-administered medications prior to visit.    Allergies  Allergen Reactions  . Tramadol Anaphylaxis  . Morphine Nausea And Vomiting    According to mother the patient is intolerant of morphine; causes nausea and vomiting.  Has had none since he was 24yo.    ROS Review of Systems  Constitutional: Positive for appetite change.  HENT: Negative.   Respiratory: Negative.   Cardiovascular: Negative.   Gastrointestinal: Negative.   Genitourinary: Negative.   Musculoskeletal: Positive for gait problem.  Skin: Positive for rash and wound.  Neurological: Positive for speech difficulty and weakness.  Psychiatric/Behavioral: The patient is nervous/anxious.        Depression       Objective:    Physical Exam Constitutional:      Appearance: He is ill-appearing.  Eyes:     Conjunctiva/sclera: Conjunctivae normal.  Cardiovascular:     Rate and Rhythm: Normal rate and regular rhythm.     Pulses: Normal pulses.     Heart sounds: Normal heart sounds.  Pulmonary:     Effort: Pulmonary effort is normal.     Breath sounds: Normal breath sounds.  Abdominal:     General: Bowel sounds are normal.  Musculoskeletal:        General: Tenderness present.     Cervical back: No tenderness.  Skin:    General: Skin is dry.     Findings: Rash present.  Neurological:     Coordination: Coordination abnormal.  Psychiatric:     Comments: Depression and Anxiety      BP 95/71   Pulse (!) 113   Temp (!) 96.5 F (35.8 C)   Ht 5\' 10"  (1.778 m)   Wt 145 lb (65.8 kg)   SpO2 93%   BMI 20.81 kg/m  Wt Readings from Last 3 Encounters:  05/20/20 145 lb (65.8 kg)  10/29/19 157 lb (71.2 kg)  10/28/17 144 lb (65.3 kg)     Health Maintenance Due  Topic Date Due  . Hepatitis C Screening  Never done  . TETANUS/TDAP  07/06/2018       Lab Results  Component Value Date   TSH 0.074 (L) 01/27/2020   Lab Results  Component Value Date   WBC 12.7 (H) 01/27/2020   HGB 15.2 01/27/2020   HCT 44.4 01/27/2020   MCV 96 01/27/2020   PLT 337 01/27/2020   Lab Results  Component Value Date   NA 135 01/27/2020   K 3.3 (L) 01/27/2020   CO2 28 01/27/2020   GLUCOSE 72 01/27/2020   BUN 5 (L) 01/27/2020  CREATININE 1.01 01/27/2020   BILITOT 0.3 01/27/2020   ALKPHOS 85 01/27/2020   AST 13 01/27/2020   ALT 17 01/27/2020   PROT 5.9 (L) 01/27/2020   ALBUMIN 4.0 (L) 01/27/2020   CALCIUM 9.5 01/27/2020   ANIONGAP 6 09/10/2015     Office Visit from 05/20/2020 in [facility]  PHQ-9 Total Score 18        GAD 7 : Generalized Anxiety Score 05/20/2020 02/20/2020  Nervous, Anxious, on Edge 3 1  Control/stop worrying 3 1  Worry too much - different things 3 1  Trouble relaxing 3 1  Restless 3 0  Easily annoyed or irritable 3 0  Afraid - awful might happen 2 0  Total GAD 7 Score 20 4  Anxiety Difficulty Very difficult Somewhat difficult    Assessment & Plan:  Wound check, abscess Patient is a 24 year old male who presents to clinic with rash/wound on his left buttock. The wound is gradually improving since onset. The rash is characterized by blistering and redness. It is unknown if there was an exposure to a precipitant. Past treatments include topical steroids. Treatment provided mild relief. Visit information provided by patient's aunt. Wound cultures collected. Wound care education provided with printed handouts given to primary caregiver. Follow-up with worsening or unresolved symptoms.  Depression, major, single episode, severe ([place]) Patient presents with depression, he complains of trouble falling asleep, and loss of appetite. onset was approximately few months ago per caregiver. Per caregiver patient reports thoughts that he will be better off dead or hurting himself in some way. Family history  is not significant for depression. Possible organic causes contributing to our current illness. Risk factors: Patient is not on any current medication or treatment for depression and anxiety. Education provided to patient caregiver with printed handout given. Patient started on SSRI. Rx sent to pharmacy. Follow-up in 4 to 6 weeks.  Problem List Items Addressed This Visit      Other   Wound check, abscess - Primary   Relevant Orders   Anaerobic and Aerobic Culture        Follow-up: Return in about 4 weeks (around 06/17/2020) for new medication follow up (anti depressant).    Ivy Lynn, NP

## 2020-05-20 NOTE — Patient Instructions (Addendum)
Wound check, abscess Patient is a 24 year old male who presents to clinic with rash/wound on his left buttock. The wound is gradually improving since onset. The rash is characterized by blistering and redness. It is unknown if there was an exposure to a precipitant. Past treatments include topical steroids. Treatment provided mild relief. Visit information provided by patient's aunt. Wound cultures collected. Wound care education provided with printed handouts given to primary caregiver. Follow-up with worsening or unresolved symptoms.  Depression, major, single episode, severe ([place]) Patient presents with depression, he complains of trouble falling asleep, and loss of appetite. onset was approximately few months ago per caregiver. Per caregiver patient reports thoughts that he will be better off dead or hurting himself in some way. Family history is not significant for depression. Possible organic causes contributing to our current illness. Risk factors: Patient is not on any current medication or treatment for depression and anxiety. Education provided to patient caregiver with printed handout given. Patient started on SSRI. Rx sent to pharmacy. Follow-up in 4 to 6 weeks.   Depression Screening Depression screening is a tool that your health care provider can use to learn if you have symptoms of depression. Depression is a common condition with many symptoms that are also often found in other conditions. Depression is treatable, but it must first be diagnosed. You may not know that certain feelings, thoughts, and behaviors that you are having can be symptoms of depression. Taking a depression screening test can help you and your health care provider decide if you need more assessment, or if you should be referred to a mental health care provider. What are the screening tests?  You may have a physical exam to see if another condition is affecting your mental health. You may have a blood or urine  sample taken during the physical exam.  You may be interviewed using a screening tool that was developed from research, such as one of these: ? Patient Health Questionnaire (PHQ). This is a set of either 2 or 9 questions. A health care provider who has been trained to score this screening test uses a guide to assess if your symptoms suggest that you may have depression. ? Hamilton Depression Rating Scale (HAM-D). This is a set of either 17 or 24 questions. You may be asked to take it again during or after your treatment, to see if your depression has gotten better. ? Beck Depression Inventory (BDI). This is a set of 21 multiple choice questions. Your health care provider scores your answers to assess:  Your level of depression, ranging from mild to severe.  Your response to treatment.  Your health care provider may talk with you about your daily activities, such as eating, sleeping, work, and recreation, and ask if you have had any changes in activity.  Your health care provider may ask you to see a mental health specialist, such as a psychiatrist or psychologist, for more evaluation. Who should be screened for depression?   All adults, including adults with a family history of a mental health disorder.  Adolescents who are 56-40 years old.  People who are recovering from a myocardial infarction (MI).  Pregnant women, or women who have given birth.  People who have a long-term (chronic) illness.  Anyone who has been diagnosed with another type of a mental health disorder.  Anyone who has symptoms that could show depression. What do my results mean? Your health care provider will review the results of your depression screening, physical exam,  and lab tests. Positive screens suggest that you may have depression. Screening is the first step in getting the care that you may need. It is up to you to get your screening results. Ask your health care provider, or the department that is doing  your screening tests, when your results will be ready. Talk with your health care provider about your results and diagnosis. A diagnosis of depression is made using the Diagnostic and Statistical Manual of Mental Disorders (DSM-V). This is a book that lists the number and type of symptoms that must be present for a health care provider to give a specific diagnosis.  Your health care provider may work with you to treat your symptoms of depression, or your health care provider may help you find a mental health provider who can assess, diagnose, and treat your depression. Get help right away if:  You have thoughts about hurting yourself or others. If you ever feel like you may hurt yourself or others, or have thoughts about taking your own life, get help right away. You can go to your nearest emergency department or call:  Your local emergency services (911 in the [place]  A suicide crisis helpline, such as the [facility] at 7750591922. This is open 24 hours a day. Summary  Depression screening is the first step in getting the help that you may need.  If your screening test shows symptoms of depression (is positive), your health care provider may ask you to see a mental health provider.  Anyone who is age 25 or older should be screened for depression. This information is not intended to replace advice given to you by your health care provider. Make sure you discuss any questions you have with your health care provider. Document Revised: 10/12/2017 Document Reviewed: 03/16/2017 Elsevier Patient Education  [place], Adult Taking care of your wound properly can help to prevent pain, infection, and scarring. It can also help your wound to heal more quickly. How to care for your wound Wound care      Follow instructions from your health care provider about how to take care of your wound. Make sure you: ? Wash your hands with soap and water  before you change the bandage (dressing). If soap and water are not available, use hand sanitizer. ? Change your dressing as told by your health care provider. ? Leave stitches (sutures), skin glue, or adhesive strips in place. These skin closures may need to stay in place for 2 weeks or longer. If adhesive strip edges start to loosen and curl up, you may trim the loose edges. Do not remove adhesive strips completely unless your health care provider tells you to do that.  Check your wound area every day for signs of infection. Check for: ? Redness, swelling, or pain. ? Fluid or blood. ? Warmth. ? Pus or a bad smell.  Ask your health care provider if you should clean the wound with mild soap and water. Doing this may include: ? Using a clean towel to pat the wound dry after cleaning it. Do not rub or scrub the wound. ? Applying a cream or ointment. Do this only as told by your health care provider. ? Covering the incision with a clean dressing.  Ask your health care provider when you can leave the wound uncovered.  Keep the dressing dry until your health care provider says it can be removed. Do not take baths, swim, use a hot tub, or  do anything that would put the wound underwater until your health care provider approves. Ask your health care provider if you can take showers. You may only be allowed to take sponge baths. Medicines   If you were prescribed an antibiotic medicine, cream, or ointment, take or use the antibiotic as told by your health care provider. Do not stop taking or using the antibiotic even if your condition improves.  Take over-the-counter and prescription medicines only as told by your health care provider. If you were prescribed pain medicine, take it 30 or more minutes before you do any wound care or as told by your health care provider. General instructions  Return to your normal activities as told by your health care provider. Ask your health care provider what  activities are safe.  Do not scratch or pick at the wound.  Do not use any products that contain nicotine or tobacco, such as cigarettes and e-cigarettes. These may delay wound healing. If you need help quitting, ask your health care provider.  Keep all follow-up visits as told by your health care provider. This is important.  Eat a diet that includes protein, vitamin A, vitamin C, and other nutrient-rich foods to help the wound heal. ? Foods rich in protein include meat, dairy, beans, nuts, and other sources. ? Foods rich in vitamin A include carrots and dark green, leafy vegetables. ? Foods rich in vitamin C include citrus, tomatoes, and other fruits and vegetables. ? Nutrient-rich foods have protein, carbohydrates, fat, vitamins, or minerals. Eat a variety of healthy foods including vegetables, fruits, and whole grains. Contact a health care provider if:  You received a tetanus shot and you have swelling, severe pain, redness, or bleeding at the injection site.  Your pain is not controlled with medicine.  You have redness, swelling, or pain around the wound.  You have fluid or blood coming from the wound.  Your wound feels warm to the touch.  You have pus or a bad smell coming from the wound.  You have a fever or chills.  You are nauseous or you vomit.  You are dizzy. Get help right away if:  You have a red streak going away from your wound.  The edges of the wound open up and separate.  Your wound is bleeding, and the bleeding does not stop with gentle pressure.  You have a rash.  You faint.  You have trouble breathing. Summary  Always wash your hands with soap and water before changing your bandage (dressing).  To help with healing, eat foods that are rich in protein, vitamin A, vitamin C, and other nutrients.  Check your wound every day for signs of infection. Contact your health care provider if you suspect that your wound is infected. This information is  not intended to replace advice given to you by your health care provider. Make sure you discuss any questions you have with your health care provider. Document Revised: 02/17/2019 Document Reviewed: 05/16/2016 Elsevier Patient Education  [place].

## 2020-05-24 LAB — ANAEROBIC AND AEROBIC CULTURE

## 2020-05-25 ENCOUNTER — Telehealth: Payer: Self-pay

## 2020-06-07 ENCOUNTER — Ambulatory Visit: Payer: Medicare Other | Admitting: *Deleted

## 2020-06-07 ENCOUNTER — Other Ambulatory Visit: Payer: Self-pay | Admitting: *Deleted

## 2020-06-07 DIAGNOSIS — H5410 Blindness, one eye, low vision other eye, unspecified eyes: Secondary | ICD-10-CM

## 2020-06-07 DIAGNOSIS — Z87898 Personal history of other specified conditions: Secondary | ICD-10-CM

## 2020-06-07 DIAGNOSIS — F322 Major depressive disorder, single episode, severe without psychotic features: Secondary | ICD-10-CM

## 2020-06-07 MED ORDER — ESCITALOPRAM OXALATE 5 MG PO TABS: 5.0000 mg | ORAL_TABLET | Freq: Every day | ORAL | 0 refills | Status: DC

## 2020-06-08 NOTE — Chronic Care Management (AMB) (Signed)
  Chronic Care Management   Follow-up Outreach Note  06/07/2020 Name: James Hoffman MRN: [id number] DOB: 07-09-1996  Referred by: [name], DO Reason for referral : Chronic Care Management (RN follow up)   Multiple unsuccessful telephone follow-up attempts were made oday. The patient was referred to the case management team for assistance with care management and care coordination.   Follow Up Plan: The care management team will reach out to the patient again over the next 30 days.   Chong Sicilian, BSN, RN-BC Embedded Chronic Care Manager [facility] [place] [facility] Direct Dial: [phone]

## 2020-06-17 ENCOUNTER — Other Ambulatory Visit: Payer: Self-pay

## 2020-06-17 ENCOUNTER — Other Ambulatory Visit: Payer: Medicare Other

## 2020-06-17 DIAGNOSIS — E871 Hypo-osmolality and hyponatremia: Secondary | ICD-10-CM | POA: Diagnosis not present

## 2020-06-17 DIAGNOSIS — D72829 Elevated white blood cell count, unspecified: Secondary | ICD-10-CM | POA: Diagnosis not present

## 2020-06-17 DIAGNOSIS — E890 Postprocedural hypothyroidism: Secondary | ICD-10-CM | POA: Diagnosis not present

## 2020-06-17 LAB — CBC WITH DIFFERENTIAL/PLATELET
Basophils Absolute: 0.1 10*3/uL (ref 0.0–0.2)
Basos: 1 %
EOS (ABSOLUTE): 0.4 10*3/uL (ref 0.0–0.4)
Eos: 3 %
Hematocrit: 40.1 % (ref 37.5–51.0)
Hemoglobin: 14 g/dL (ref 13.0–17.7)
Immature Grans (Abs): 0.2 10*3/uL — ABNORMAL HIGH (ref 0.0–0.1)
Immature Granulocytes: 1 %
Lymphocytes Absolute: 1.7 10*3/uL (ref 0.7–3.1)
Lymphs: 12 %
MCH: 33.7 pg — ABNORMAL HIGH (ref 26.6–33.0)
MCHC: 34.9 g/dL (ref 31.5–35.7)
MCV: 96 fL (ref 79–97)
Monocytes Absolute: 0.9 10*3/uL (ref 0.1–0.9)
Monocytes: 6 %
Neutrophils Absolute: 11.5 10*3/uL — ABNORMAL HIGH (ref 1.4–7.0)
Neutrophils: 77 %
Platelets: 476 10*3/uL — ABNORMAL HIGH (ref 150–450)
RBC: 4.16 x10E6/uL (ref 4.14–5.80)
RDW: 15.1 % (ref 11.6–15.4)
WBC: 14.8 10*3/uL — ABNORMAL HIGH (ref 3.4–10.8)

## 2020-06-18 LAB — BASIC METABOLIC PANEL
BUN/Creatinine Ratio: 9 (ref 9–20)
BUN: 7 mg/dL (ref 6–20)
CO2: 22 mmol/L (ref 20–29)
Calcium: 9.5 mg/dL (ref 8.7–10.2)
Chloride: 93 mmol/L — ABNORMAL LOW (ref 96–106)
Creatinine, Ser: 0.82 mg/dL (ref 0.76–1.27)
GFR calc Af Amer: 143 mL/min/{1.73_m2} (ref >59)
GFR calc non Af Amer: 124 mL/min/{1.73_m2} (ref >59)
Glucose: 112 mg/dL — ABNORMAL HIGH (ref 65–99)
Potassium: 3.6 mmol/L (ref 3.5–5.2)
Sodium: 132 mmol/L — ABNORMAL LOW (ref 134–144)

## 2020-06-18 LAB — T4, FREE: Free T4: 1.76 ng/dL (ref 0.82–1.77)

## 2020-06-20 ENCOUNTER — Other Ambulatory Visit: Payer: Self-pay | Admitting: Family Medicine

## 2020-06-21 ENCOUNTER — Telehealth: Payer: Self-pay | Admitting: Family Medicine

## 2020-06-21 NOTE — Telephone Encounter (Signed)
Pt aware of results and voiced understanding. 

## 2020-06-24 ENCOUNTER — Telehealth: Payer: Self-pay | Admitting: *Deleted

## 2020-06-24 ENCOUNTER — Ambulatory Visit (INDEPENDENT_AMBULATORY_CARE_PROVIDER_SITE_OTHER): Payer: Medicare Other

## 2020-06-24 ENCOUNTER — Telehealth: Payer: Medicare Other

## 2020-06-24 ENCOUNTER — Encounter: Payer: Self-pay | Admitting: Nurse Practitioner

## 2020-06-24 ENCOUNTER — Other Ambulatory Visit: Payer: Self-pay

## 2020-06-24 ENCOUNTER — Ambulatory Visit (INDEPENDENT_AMBULATORY_CARE_PROVIDER_SITE_OTHER): Payer: Medicare Other | Admitting: Nurse Practitioner

## 2020-06-24 VITALS — BP 106/73 | HR 120 | Temp 97.0°F | Ht 70.0 in | Wt 138.0 lb

## 2020-06-24 DIAGNOSIS — F322 Major depressive disorder, single episode, severe without psychotic features: Secondary | ICD-10-CM | POA: Diagnosis not present

## 2020-06-24 DIAGNOSIS — Z5189 Encounter for other specified aftercare: Secondary | ICD-10-CM | POA: Diagnosis not present

## 2020-06-24 DIAGNOSIS — R0689 Other abnormalities of breathing: Secondary | ICD-10-CM | POA: Diagnosis not present

## 2020-06-24 DIAGNOSIS — R05 Cough: Secondary | ICD-10-CM | POA: Diagnosis not present

## 2020-06-24 DIAGNOSIS — R21 Rash and other nonspecific skin eruption: Secondary | ICD-10-CM | POA: Diagnosis not present

## 2020-06-24 DIAGNOSIS — R9389 Abnormal findings on diagnostic imaging of other specified body structures: Secondary | ICD-10-CM | POA: Insufficient documentation

## 2020-06-24 MED ORDER — NAPROXEN 500 MG PO TABS: 500.0000 mg | ORAL_TABLET | Freq: Two times a day (BID) | ORAL | 0 refills | Status: DC

## 2020-06-24 MED ORDER — SILVER SULFADIAZINE 1 % EX CREA: 1.0000 "application " | TOPICAL_CREAM | Freq: Every day | CUTANEOUS | 0 refills | Status: DC

## 2020-06-24 MED ORDER — ESCITALOPRAM OXALATE 5 MG PO TABS: 5.0000 mg | ORAL_TABLET | Freq: Every day | ORAL | 0 refills | Status: DC

## 2020-06-24 NOTE — Assessment & Plan Note (Signed)
Patient's wound abscess on sacrum is well managed and well healed/scabbed over.  No changes to medication.  Patient will continue to keep skin clean and dry relieving pressure.  Education provided with written handouts given. Patient will follow up as needed.

## 2020-06-24 NOTE — Progress Notes (Signed)
Established Patient Office Visit  Subjective:  Patient ID: James Hoffman, male    DOB: November 05, 1996  Age: 24 y.o. MRN: [id number]  CC:  Chief Complaint  Patient presents with  . Follow-up    HPI James Hoffman presents for wound check abscess.  Patient is reporting no new concerns, patient skin is dry and clean and showing signs of healing wound.  Wound is scabbed over on patient's sacrum.  Concerning patient's depression and anxiety.  Patient's primary caregiver reports that medication was never picked up from pharmacy since 06/07/2020.  Resent being she did not know she had to pick up medication.  Patient continues to exhibit signs and symptoms of depression and anxiety.  Including agitation, excessive worrying, aggressive behavior, and moodiness.   Patient presents with congestion, cough and diminished breath sounds in bilateral lower lung bases.  Patient reports copious amount of sputum, brown in color.  Denies fever, headache, nausea or vomiting.  This is no in the last 7 days.  And progressively getting worse.  Nothing has been used to alleviate symptoms.  Past Medical History:  Diagnosis Date  . Adrenal insufficiency ([place])   . Cancer ([place])    brain tumor on brain stem  . Hydrocephalus ([place])   . Osteoporosis   . Thyroid disease     Past Surgical History:  Procedure Laterality Date  . brain tumor removal  August 2012  . EYE SURGERY Right 2014   cataract removed  . HIP SURGERY    . teeth removal      History reviewed. No pertinent family history.  Social History   Socioeconomic History  . Marital status: Single    Spouse name: Not on file  . Number of children: 0  . Years of education: 7th grade  . Highest education level: 7th grade  Occupational History  . Occupation: disabled  Tobacco Use  . Smoking status: Current Every Day Smoker    Packs/day: 0.25    Years: 6.00    Pack years: 1.50    Types: Cigarettes    Start date: 11/25/2012  . Smokeless  tobacco: Never Used  Vaping Use  . Vaping Use: Never used  Substance and Sexual Activity  . Alcohol use: No  . Drug use: No  . Sexual activity: Not Currently  Other Topics Concern  . Not on file  Social History Narrative  . Not on file   Social Determinants of Health   Financial Resource Strain: Low Risk   . Difficulty of Paying Living Expenses: Not very hard  Food Insecurity: No Food Insecurity  . Worried About Charity fundraiser in the Last Year: Never true  . Ran Out of Food in the Last Year: Never true  Transportation Needs: No Transportation Needs  . Lack of Transportation (Medical): No  . Lack of Transportation (Non-Medical): No  Physical Activity: Inactive  . Days of Exercise per Week: 0 days  . Minutes of Exercise per Session: 0 min  Stress: Stress Concern Present  . Feeling of Stress : To some extent  Social Connections: Socially Isolated  . Frequency of Communication with Friends and Family: More than three times a week  . Frequency of Social Gatherings with Friends and Family: More than three times a week  . Attends Religious Services: Never  . Active Member of Clubs or Organizations: No  . Attends Archivist Meetings: Never  . Marital Status: Never married  Intimate Partner Violence: Not At Risk  .  Fear of Current or Ex-Partner: No  . Emotionally Abused: No  . Physically Abused: No  . Sexually Abused: No    Outpatient Medications Prior to Visit  Medication Sig Dispense Refill  . Cholecalciferol 50 MCG (2000 UT) CAPS Take by mouth.    . clotrimazole-betamethasone (LOTRISONE) cream Apply 1 application topically 2 (two) times daily. x7-10 30 g 0  . feeding supplement, ENSURE COMPLETE, (ENSURE COMPLETE) LIQD Take 237 mLs by mouth 2 (two) times daily between meals. 500 mL 10  . levETIRAcetam (KEPPRA) 750 MG tablet Take 1,500 mg by mouth in the morning and at bedtime.    Marland Kitchen levothyroxine (SYNTHROID) 100 MCG tablet Take 1 tablet (100 mcg total) by mouth  daily before breakfast. 95 tablet 3  . predniSONE (DELTASONE) 5 MG tablet Take 1.5 tablets (7.5 mg total) by mouth daily with breakfast.    . Testosterone 20.25 MG/ACT (1.62%) GEL ONE PUM ON EACH SHOULDER EVERY MORNIG 75 g 1  . escitalopram (LEXAPRO) 5 MG tablet Take 1 tablet (5 mg total) by mouth daily. 90 tablet 0  . naproxen (NAPROSYN) 500 MG tablet TAKE 1 TABLET (500 MG TOTAL) BY MOUTH 2 (TWO) TIMES DAILY WITH A MEAL. 60 tablet 0   No facility-administered medications prior to visit.    Allergies  Allergen Reactions  . Tramadol Anaphylaxis  . Morphine Nausea And Vomiting    According to mother the patient is intolerant of morphine; causes nausea and vomiting.  Has had none since he was 24yo.    ROS Review of Systems  Constitutional: Negative.   HENT: Negative.   Respiratory: Negative.   Cardiovascular: Negative.   Genitourinary: Negative.   Musculoskeletal: Positive for arthralgias, gait problem and myalgias.  Skin: Negative for color change.  Neurological: Positive for weakness.  Psychiatric/Behavioral: The patient is nervous/anxious.       Objective:    Physical Exam Vitals reviewed.  Constitutional:      Appearance: Normal appearance.  Eyes:     Conjunctiva/sclera: Conjunctivae normal.  Cardiovascular:     Rate and Rhythm: Normal rate.     Pulses: Normal pulses.     Heart sounds: Normal heart sounds.  Pulmonary:     Effort: Pulmonary effort is normal.     Breath sounds: Normal breath sounds.  Abdominal:     General: Bowel sounds are normal.  Musculoskeletal:        General: Tenderness present.  Skin:    General: Skin is warm.     Findings: Erythema and rash present.  Neurological:     Mental Status: He is alert.     Motor: Weakness present.     Gait: Gait abnormal.  Psychiatric:     Comments: depression/anxiety     BP 106/73   Pulse (!) 120   Temp (!) 97 F (36.1 C)   Ht 5\' 10"  (1.778 m)   Wt 138 lb (62.6 kg)   SpO2 97%   BMI 19.80 kg/m  Wt  Readings from Last 3 Encounters:  06/24/20 138 lb (62.6 kg)  05/20/20 145 lb (65.8 kg)  10/29/19 157 lb (71.2 kg)     Health Maintenance Due  Topic Date Due  . Hepatitis C Screening  Never done  . TETANUS/TDAP  07/06/2018  . INFLUENZA VACCINE  06/13/2020      Lab Results  Component Value Date   TSH 0.074 (L) 01/27/2020   Lab Results  Component Value Date   WBC 14.8 (H) 06/17/2020   HGB 14.0 06/17/2020  HCT 40.1 06/17/2020   MCV 96 06/17/2020   PLT 476 (H) 06/17/2020   Lab Results  Component Value Date   NA 132 (L) 06/17/2020   K 3.6 06/17/2020   CO2 22 06/17/2020   GLUCOSE 112 (H) 06/17/2020   BUN 7 06/17/2020   CREATININE 0.82 06/17/2020   BILITOT 0.3 01/27/2020   ALKPHOS 85 01/27/2020   AST 13 01/27/2020   ALT 17 01/27/2020   PROT 5.9 (L) 01/27/2020   ALBUMIN 4.0 (L) 01/27/2020   CALCIUM 9.5 06/17/2020   ANIONGAP 6 09/10/2015      Assessment & Plan:   Problem List Items Addressed This Visit      Other   Wound check, abscess - Primary    Patient's wound abscess on sacrum is well managed and well healed/scabbed over.  No changes to medication.  Patient will continue to keep skin clean and dry relieving pressure.  Education provided with written handouts given. Patient will follow up as needed.      Relevant Medications   silver sulfADIAZINE (SILVADENE) 1 % cream   Depression, major, single episode, severe ([facility])    No changes to patient's current depression/anxiety patient was diagnosed 05/20/2020.  Patient was unable to take medication as prescribed due to medication not picked from pharmacy.  Provided extensive education to caregiver on the importance of taking medication as prescribed.  I reordered medication to pharmacy, Lexapro 5 mg daily.  Follow-up in 4 weeks. Printed handouts given. Rx sent to pharmacy      Relevant Medications   escitalopram (LEXAPRO) 5 MG tablet   Decreased lung sounds    Patient is a 24 year old male who is presenting to  clinic with decreased bilateral lower lung sounds.  Patient is reporting cough and congestion, in the last 7 days.  Denies fever, headache,nausea or vomiting.  Nothing has alleviated symptoms. X-ray ordered to rule out pneumonia.  Guaifenesin sent to pharmacy to relieve congestion. Follow-up with worsening or unresolved symptoms.      Relevant Orders   DG Chest 2 View      Meds ordered this encounter  Medications  . escitalopram (LEXAPRO) 5 MG tablet    Sig: Take 1 tablet (5 mg total) by mouth daily.    Dispense:  90 tablet    Refill:  0    Order Specific Question:   Supervising Provider    Answer:   Caryl Pina A [id number]  . naproxen (NAPROSYN) 500 MG tablet    Sig: Take 1 tablet (500 mg total) by mouth 2 (two) times daily with a meal.    Dispense:  60 tablet    Refill:  0    Order Specific Question:   Supervising Provider    Answer:   Caryl Pina A [id number]  . silver sulfADIAZINE (SILVADENE) 1 % cream    Sig: Apply 1 application topically daily.    Dispense:  50 g    Refill:  0    Order Specific Question:   Supervising Provider    Answer:   Caryl Pina A [[id number]]    Follow-up: Return in about 3 months (around 09/24/2020).    Ivy Lynn, NP

## 2020-06-24 NOTE — Patient Instructions (Addendum)
Wound check, abscess Patient's wound abscess on sacrum is well managed and well healed/scabbed over.  No changes to medication.  Patient will continue to keep skin clean and dry relieving pressure.  Education provided with written handouts given. Patient will follow up as needed.  Depression, major, single episode, severe ([place]) No changes to patient's current depression/anxiety patient was diagnosed 05/20/2020.  Patient was unable to take medication as prescribed due to medication not picked from pharmacy.  Provided extensive education to caregiver on the importance of taking medication as prescribed.  I reordered medication to pharmacy, Lexapro 5 mg daily.  Follow-up in 4 weeks. Printed handouts given. Rx sent to pharmacy  Decreased lung sounds Patient is a 24 year old male who is presenting to clinic with decreased bilateral lower lung sounds.  Patient is reporting cough and congestion, in the last 7 days.  Denies fever, headache,nausea or vomiting.  Nothing has alleviated symptoms. X-ray ordered to rule out pneumonia.  Guaifenesin sent to pharmacy to relieve congestion. Follow-up with worsening or unresolved symptoms.   Pressure Injury  A pressure injury is damage to the skin and underlying tissue that results from pressure being applied to an area of the body. It often affects people who must spend a long time in a bed or chair because of a medical condition. Pressure injuries usually occur:  Over bony parts of the body, such as the tailbone, shoulders, elbows, hips, heels, spine, ankles, and back of the head.  Under medical devices that make contact with the body, such as respiratory equipment, stockings, tubes, and splints. Pressure injuries start as reddened areas on the skin and can lead to pain and an open wound. What are the causes? This condition is caused by frequent or constant pressure to an area of the body. Decreased blood flow to the skin can eventually cause the skin tissue  to die and break down, causing a wound. What increases the risk? You are more likely to develop this condition if you:  Are in the hospital or an extended care facility.  Are bedridden or in a wheelchair.  Have an injury or disease that keeps you from: ? Moving normally. ? Feeling pain or pressure.  Have a condition that: ? Makes you sleepy or less alert. ? Causes poor blood flow.  Need to wear a medical device.  Have poor control of your bladder or bowel functions (incontinence).  Have poor nutrition (malnutrition). If you are at risk for pressure injuries, your health care provider may recommend certain types of mattresses, mattress covers, pillows, cushions, or boots to help prevent them. These may include products filled with air, foam, gel, or sand. What are the signs or symptoms? Symptoms of this condition depend on the severity of the injury. Symptoms may include:  Red or dark areas of the skin.  Pain, warmth, or a change of skin texture.  Blisters.  An open wound. How is this diagnosed? This condition is diagnosed with a medical history and physical exam. You may also have tests, such as:  Blood tests.  Imaging tests.  Blood flow tests. Your pressure injury will be staged based on its severity. Staging is based on:  The depth of the tissue injury, including whether there is exposure of muscle, bone, or tendon.  The cause of the pressure injury. How is this treated? This condition may be treated by:  Relieving or redistributing pressure on your skin. This includes: ? Frequently changing your position. ? Avoiding positions that caused the wound  or that can make the wound worse. ? Using specific bed mattresses, chair cushions, or protective boots. ? Moving medical devices from an area of pressure, or placing padding between the skin and the device. ? Using foams, creams, or powders to prevent rubbing (friction) on the skin.  Keeping your skin clean and dry.  This may include using a skin cleanser or skin barrier as told by your health care provider.  Cleaning your injury and removing any dead tissue from the wound (debridement).  Placing a bandage (dressing) over your injury.  Using medicines for pain or to prevent or treat infection. Surgery may be needed if other treatments are not working or if your injury is very deep. Follow these instructions at home: Wound care  Follow instructions from your health care provider about how to take care of your wound. Make sure you: ? Wash your hands with soap and water before and after you change your bandage (dressing). If soap and water are not available, use hand sanitizer. ? Change your dressing as told by your health care provider.  Check your wound every day for signs of infection. Have a caregiver do this for you if you are not able. Check for: ? Redness, swelling, or increased pain. ? More fluid or blood. ? Warmth. ? Pus or a bad smell. Skin care  Keep your skin clean and dry. Gently pat your skin dry.  Do not rub or massage your skin.  You or a caregiver should check your skin every day for any changes in color or any new blisters or sores (ulcers). Medicines  Take over-the-counter and prescription medicines only as told by your health care provider.  If you were prescribed an antibiotic medicine, take or apply it as told by your health care provider. Do not stop using the antibiotic even if your condition improves. Reducing and redistributing pressure  Do not lie or sit in one position for a long time. Move or change position every 1-2 hours, or as told by your health care provider.  Use pillows or cushions to reduce pressure. Ask your health care provider to recommend cushions or pads for you. General instructions   Eat a healthy diet that includes lots of protein.  Drink enough fluid to keep your urine pale yellow.  Be as active as you can every day. Ask your health care  provider to suggest safe exercises or activities.  Do not abuse drugs or alcohol.  Do not use any products that contain nicotine or tobacco, such as cigarettes, e-cigarettes, and chewing tobacco. If you need help quitting, ask your health care provider.  Keep all follow-up visits as told by your health care provider. This is important. Contact a health care provider if:  You have: ? A fever or chills. ? Pain that is not helped by medicine. ? Any changes in skin color. ? New blisters or sores. ? Pus or a bad smell coming from your wound. ? Redness, swelling, or pain around your wound. ? More fluid or blood coming from your wound.  Your wound does not improve after 1-2 weeks of treatment. Summary  A pressure injury is damage to the skin and underlying tissue that results from pressure being applied to an area of the body.  Do not lie or sit in one position for a long time. Your health care provider may advise you to move or change position every 1-2 hours.  Follow instructions from your health care provider about how to take  care of your wound.  Keep all follow-up visits as told by your health care provider. This is important. This information is not intended to replace advice given to you by your health care provider. Make sure you discuss any questions you have with your health care provider. Document Revised: 05/29/2018 Document Reviewed: 05/29/2018 Elsevier Patient Education  [place] With Depression Everyone experiences occasional disappointment, sadness, and loss in their lives. When you are feeling down, blue, or sad for at least 2 weeks in a row, it may mean that you have depression. Depression can affect your thoughts and feelings, relationships, daily activities, and physical health. It is caused by changes in the way your brain functions. If you receive a diagnosis of depression, your health care provider will tell you which type of depression you have and  what treatment options are available to you. If you are living with depression, there are ways to help you recover from it and also ways to prevent it from coming back. How to cope with lifestyle changes Coping with stress     Stress is your bodys reaction to life changes and events, both good and bad. Stressful situations may include:  Getting married.  The death of a spouse.  Losing a job.  Retiring.  Having a baby. Stress can last just a few hours or it can be ongoing. Stress can play a major role in depression, so it is important to learn both how to cope with stress and how to think about it differently. Talk with your health care provider or a counselor if you would like to learn more about stress reduction. He or she may suggest some stress reduction techniques, such as:  Music therapy. This can include creating music or listening to music. Choose music that you enjoy and that inspires you.  Mindfulness-based meditation. This kind of meditation can be done while sitting or walking. It involves being aware of your normal breaths, rather than trying to control your breathing.  Centering prayer. This is a kind of meditation that involves focusing on a spiritual word or phrase. Choose a word, phrase, or sacred image that is meaningful to you and that brings you peace.  Deep breathing. To do this, expand your stomach and inhale slowly through your nose. Hold your breath for 3-5 seconds, then exhale slowly, allowing your stomach muscles to relax.  Muscle relaxation. This involves intentionally tensing muscles then relaxing them. Choose a stress reduction technique that fits your lifestyle and personality. Stress reduction techniques take time and practice to develop. Set aside 5-15 minutes a day to do them. Therapists can offer training in these techniques. The training may be covered by some insurance plans. Other things you can do to manage stress include:  Keeping a stress diary.  This can help you learn what triggers your stress and ways to control your response.  Understanding what your limits are and saying no to requests or events that lead to a schedule that is too full.  Thinking about how you respond to certain situations. You may not be able to control everything, but you can control how you react.  Adding humor to your life by watching funny films or TV shows.  Making time for activities that help you relax and not feeling guilty about spending your time this way.  Medicines Your health care provider may suggest certain medicines if he or she feels that they will help improve your condition. Avoid using alcohol and other substances  that may prevent your medicines from working properly (may interact). It is also important to:  Talk with your pharmacist or health care provider about all the medicines that you take, their possible side effects, and what medicines are safe to take together.  Make it your goal to take part in all treatment decisions (shared decision-making). This includes giving input on the side effects of medicines. It is best if shared decision-making with your health care provider is part of your total treatment plan. If your health care provider prescribes a medicine, you may not notice the full benefits of it for 4-8 weeks. Most people who are treated for depression need to be on medicine for at least 6-12 months after they feel better. If you are taking medicines as part of your treatment, do not stop taking medicines without first talking to your health care provider. You may need to have the medicine slowly decreased (tapered) over time to decrease the risk of harmful side effects. Relationships Your health care provider may suggest family therapy along with individual therapy and drug therapy. While there may not be family problems that are causing you to feel depressed, it is still important to make sure your family learns as much as they can  about your mental health. Having your familys support can help make your treatment successful. How to recognize changes in your condition Everyone has a different response to treatment for depression. Recovery from major depression happens when you have not had signs of major depression for two months. This may mean that you will start to:  Have more interest in doing activities.  Feel less hopeless than you did 2 months ago.  Have more energy.  Overeat less often, or have better or improving appetite.  Have better concentration. Your health care provider will work with you to decide the next steps in your recovery. It is also important to recognize when your condition is getting worse. Watch for these signs:  Having fatigue or low energy.  Eating too much or too little.  Sleeping too much or too little.  Feeling restless, agitated, or hopeless.  Having trouble concentrating or making decisions.  Having unexplained physical complaints.  Feeling irritable, angry, or aggressive. Get help as soon as you or your family members notice these symptoms coming back. How to get support and help from others How to talk with friends and family members about your condition  Talking to friends and family members about your condition can provide you with one way to get support and guidance. Reach out to trusted friends or family members, explain your symptoms to them, and let them know that you are working with a health care provider to treat your depression. Financial resources Not all insurance plans cover mental health care, so it is important to check with your insurance carrier. If paying for co-pays or counseling services is a problem, search for a local or county mental health care center. They may be able to offer public mental health care services at low or no cost when you are not able to see a private health care provider. If you are taking medicine for depression, you may be able to  get the generic form, which may be less expensive. Some makers of prescription medicines also offer help to patients who cannot afford the medicines they need. Follow these instructions at home:   Get the right amount and quality of sleep.  Cut down on using caffeine, tobacco, alcohol, and other potentially harmful  substances.  Try to exercise, such as walking or lifting small weights.  Take over-the-counter and prescription medicines only as told by your health care provider.  Eat a healthy diet that includes plenty of vegetables, fruits, whole grains, low-fat dairy products, and lean protein. Do not eat a lot of foods that are high in solid fats, added sugars, or salt.  Keep all follow-up visits as told by your health care provider. This is important. Contact a health care provider if:  You stop taking your antidepressant medicines, and you have any of these symptoms: ? Nausea. ? Headache. ? Feeling lightheaded. ? Chills and body aches. ? Not being able to sleep (insomnia).  You or your friends and family think your depression is getting worse. Get help right away if:  You have thoughts of hurting yourself or others. If you ever feel like you may hurt yourself or others, or have thoughts about taking your own life, get help right away. You can go to your nearest emergency department or call:  Your local emergency services (911 in the [place]  A suicide crisis helpline, such as the [facility] at 7122317135. This is open 24-hours a day. Summary  If you are living with depression, there are ways to help you recover from it and also ways to prevent it from coming back.  Work with your health care team to create a management plan that includes counseling, stress management techniques, and healthy lifestyle habits. This information is not intended to replace advice given to you by your health care provider. Make sure you discuss any questions you have  with your health care provider. Document Revised: 02/21/2019 Document Reviewed: 10/02/2016 Elsevier Patient Education  [place].

## 2020-06-24 NOTE — Assessment & Plan Note (Signed)
Patient is a 24 year old male who is presenting to clinic with decreased bilateral lower lung sounds.  Patient is reporting cough and congestion, in the last 7 days.  Denies fever, headache,nausea or vomiting.  Nothing has alleviated symptoms. X-ray ordered to rule out pneumonia.  Guaifenesin sent to pharmacy to relieve congestion. Follow-up with worsening or unresolved symptoms.

## 2020-06-24 NOTE — Chronic Care Management (AMB) (Signed)
  Chronic Care Management   Note  06/24/2020 Name: [name] MRN: [id number] DOB: 01/30/1996  James Hoffman is a 24 y.o. year old male who is a primary care patient of [name], DO and is actively engaged with the care management team. I reached out to James Hoffman by phone today to assist with re-scheduling a follow up visit with the RN Case Manager.  Follow up plan: Telephone appointment with care management team member scheduled for: 07/16/2020  [place], [place], [place] [address] Direct Dial: [email or web address] Website: [email or web address]

## 2020-06-24 NOTE — Assessment & Plan Note (Signed)
No changes to patient's current depression/anxiety patient was diagnosed 05/20/2020.  Patient was unable to take medication as prescribed due to medication not picked from pharmacy.  Provided extensive education to caregiver on the importance of taking medication as prescribed.  I reordered medication to pharmacy, Lexapro 5 mg daily.  Follow-up in 4 weeks. Printed handouts given. Rx sent to pharmacy

## 2020-06-25 ENCOUNTER — Other Ambulatory Visit: Payer: Self-pay | Admitting: Nurse Practitioner

## 2020-06-25 ENCOUNTER — Other Ambulatory Visit: Payer: Self-pay | Admitting: Family Medicine

## 2020-06-25 DIAGNOSIS — C716 Malignant neoplasm of cerebellum: Secondary | ICD-10-CM

## 2020-06-25 DIAGNOSIS — D72829 Elevated white blood cell count, unspecified: Secondary | ICD-10-CM

## 2020-06-25 MED ORDER — GUAIFENESIN 100 MG/5ML PO SOLN: 5.0000 mL | ORAL | 0 refills | Status: DC | PRN

## 2020-06-26 DIAGNOSIS — C716 Malignant neoplasm of cerebellum: Secondary | ICD-10-CM | POA: Diagnosis not present

## 2020-06-28 ENCOUNTER — Ambulatory Visit (INDEPENDENT_AMBULATORY_CARE_PROVIDER_SITE_OTHER): Payer: Medicare Other | Admitting: Licensed Clinical Social Worker

## 2020-06-28 DIAGNOSIS — E890 Postprocedural hypothyroidism: Secondary | ICD-10-CM

## 2020-06-28 DIAGNOSIS — C801 Malignant (primary) neoplasm, unspecified: Secondary | ICD-10-CM

## 2020-06-28 DIAGNOSIS — M818 Other osteoporosis without current pathological fracture: Secondary | ICD-10-CM

## 2020-06-28 DIAGNOSIS — C716 Malignant neoplasm of cerebellum: Secondary | ICD-10-CM

## 2020-06-28 DIAGNOSIS — Z87898 Personal history of other specified conditions: Secondary | ICD-10-CM

## 2020-06-28 NOTE — Chronic Care Management (AMB) (Signed)
Chronic Care Management    Clinical Social Work Follow Up Note  06/28/2020 Name: James Hoffman MRN: [id number] DOB: 06-20-1996  James Hoffman is a 24 y.o. year old male who is a primary care patient of James Norlander, DO. The CCM team was consulted for assistance with [name]   Review of patient status, including review of consultants reports, other relevant assessments, and collaboration with appropriate care team members and the patient's provider was performed as part of comprehensive patient evaluation and provision of chronic care management services.    SDOH (Social Determinants of Health) assessments performed: No;risk for social isolation; risk for tobacco use; risk for depression; risk for stress; risk for physical inactivity    Office Visit from 06/24/2020 in [place]  PHQ-9 Total Score 21     GAD 7 : Generalized Anxiety Score 05/20/2020 02/20/2020  Nervous, Anxious, on Edge 3 1  Control/stop worrying 3 1  Worry too much - different things 3 1  Trouble relaxing 3 1  Restless 3 0  Easily annoyed or irritable 3 0  Afraid - awful might happen 2 0  Total GAD 7 Score 20 4  Anxiety Difficulty Very difficult Somewhat difficult    Outpatient Encounter Medications as of 06/28/2020  Medication Sig  . Cholecalciferol 50 MCG (2000 UT) CAPS Take by mouth.  . clotrimazole-betamethasone (LOTRISONE) cream Apply 1 application topically 2 (two) times daily. x7-10  . escitalopram (LEXAPRO) 5 MG tablet Take 1 tablet (5 mg total) by mouth daily.  . feeding supplement, ENSURE COMPLETE, (ENSURE COMPLETE) LIQD Take 237 mLs by mouth 2 (two) times daily between meals.  Marland Kitchen guaiFENesin (ROBITUSSIN) 100 MG/5ML SOLN Take 5 mLs (100 mg total) by mouth every 4 (four) hours as needed for cough or to loosen phlegm.  . levETIRAcetam (KEPPRA) 750 MG tablet Take 1,500 mg by mouth in the morning and at bedtime.  Marland Kitchen levothyroxine (SYNTHROID) 100 MCG tablet Take 1  tablet (100 mcg total) by mouth daily before breakfast.  . naproxen (NAPROSYN) 500 MG tablet Take 1 tablet (500 mg total) by mouth 2 (two) times daily with a meal.  . predniSONE (DELTASONE) 5 MG tablet Take 1.5 tablets (7.5 mg total) by mouth daily with breakfast.  . silver sulfADIAZINE (SILVADENE) 1 % cream Apply 1 application topically daily.  . Testosterone 20.25 MG/ACT (1.62%) GEL ONE PUM ON EACH SHOULDER EVERY MORNIG   No facility-administered encounter medications on file as of 06/28/2020.    Goals    .  Client or family representative to talk with LCSW in next 30 days to discuss client daily needs, ADLs neds, and needs regarding wheelchair ramp (pt-stated)      CARE PLAN ENTRY   Current Barriers:  . Wheelchair bound client with Chronic Diagnoses of Medulloblastoma, Osteoporosis, , Hypothyroidism, Hx brain tumor, medullary carcinoma . Hearing deficits . Communication challenges  Clinical Social Work Clinical Goal(s):  . Client/family representative to talk with LCSW in next 30 days to discuss client daily needs, ADL needs and needs regarding wheelchair ramp  Interventions: . Talked with James Hoffman, aunt about client current needs . Talked with James Hoffman about seizures of client . Provided counseling support for James Hoffman, aunt and caregiver for client related to needs of client . Talked with James Hoffman previously about ADTS contact, Waymond Cera, related to ramp construction information . Talked with James Hoffman about ambulation needs of client . Talked with James Hoffman about vision challenges of client . Talked with James Hoffman about  headaches of client . Talked with James Hoffman about RNCM support with CCM program . Talked with James Hoffman about client MRI results . Talked with James Hoffman about client relaxation techniques (listens to music, watches favorite TV shows, enjoys spending time with family members) . Talked with James Hoffman about DME equipment of client (has wheelchair, Conservation officer, nature) . Talked with James Hoffman about her recent conversations with medical providers for client . Talked with James Hoffman about mood of client (she said client is taking medication now for depression)  Talked with James Hoffman about appetite of client James Hoffman said client has lost weight in past 4 months)  Talked with James Hoffman about leg exercises of client  Talked with James Hoffman about client support with endocrinologist  Talked with James Hoffman about client's upcoming medical appointments  Patient Self Care Activities:  Feeds self with set up  Self Care Deficits Mobility deficits Wheelchair dependent    Initial goal documentation    Follow Up Plan: LCSW to call client orCourtney Florene Hoffman, aunt , in next 4 weeks to discuss client needs at that time  James Hoffman.James Hoffman MSW, LCSW Licensed Clinical Social Worker [facility] Care Management [phone]

## 2020-06-28 NOTE — Patient Instructions (Addendum)
Licensed Clinical Social Worker Visit Information  Goals we discussed today:     .  Client or family representative to talk with LCSW in next 30 days to discuss client daily needs, ADLs neds, and needs regarding wheelchair ramp (pt-stated)       CARE PLAN ENTRY   Current Barriers:   Wheelchair bound client with Chronic Diagnoses of Medulloblastoma, Osteoporosis, , Hypothyroidism, Hx brain tumor, medullary carcinoma  Hearing deficits  Communication challenges  Clinical Social Work Clinical Goal(s):   Client/family representative to talk with LCSW in next 30 days to discuss client daily needs, ADL needs and needs regarding wheelchair ramp  Interventions:  Talked with James Hoffman, aunt about client current needs  Talked with James Hoffman about seizures of client  Provided counseling support for James Hoffman, aunt and caregiver for client related to needs of client  Talked with James Hoffman previously about ADTS contact, Waymond Cera, related to ramp construction information  Talked with James Hoffman about ambulation needs of client  Talked with James Hoffman about vision challenges of client  Talked with James Hoffman about headaches of client  Talked with James Hoffman about RNCM support with CCM program  Talked with James Hoffman about client MRI results  Talked with James Hoffman about client relaxation techniques (listens to music, watches favorite TV shows, enjoys spending time with family members)  Talked with James Hoffman about DME equipment of client (has wheelchair, Recruitment consultant)  Talked with James Hoffman about her recent conversations with medical providers for client  Talked with James Hoffman about mood of client (she said client is taking medication now for depression)  Talked with James Hoffman about appetite of client James Hoffman said client has lost weight in past 4 months)  Talked with James Hoffman about leg exercises of client  Talked with James Hoffman about client support with  endocrinologist  Talked with James Hoffman about client's upcoming medical appointments  Patient Self Care Activities:  Feeds self with set up  Self Care Deficits Mobility deficits Wheelchair dependent   Initial goal documentation    Follow Up Plan: LCSW to call client orCourtney Florene Hoffman, aunt , in next 4 weeks to discuss client needs at that time  Materials Provided: No  The patient/ James Hoffman, aunt of patient, verbalized understanding of instructions provided today and declined a print copy of patient instruction materials.   [name].Persephone Schriever MSW, LCSW Licensed Clinical Social Worker [facility] [phone]

## 2020-07-12 ENCOUNTER — Telehealth: Payer: Self-pay | Admitting: Family Medicine

## 2020-07-12 NOTE — Telephone Encounter (Signed)
  Prescription Request  07/12/2020  What is the name of the medication or equipment? Wants a RX for Meclizine for nausea  Have you contacted your pharmacy to request a refill? (if applicable) NO  Which pharmacy would you like this sent to? [facility]   Patient notified that their request is being sent to the clinical staff for review and that they should receive a response within 2 business days.

## 2020-07-12 NOTE — Telephone Encounter (Signed)
Please offer her the 2pm which is available today.  I'd like to look at him.

## 2020-07-12 NOTE — Telephone Encounter (Signed)
Patients transportation had to take another family member to the dentist.  Next available appointment is with Dr. Warrick Parisian on Wednesday at 1:40 appt made. Care taker would also like to know if they can give him meclizine with current medications until he can be seen.

## 2020-07-12 NOTE — Telephone Encounter (Signed)
That is not an appropriate medication for nausea.  Consider ginger.  If nausea and is uncontrolled such that he is unable to keep fluid down, recommend ED visit.

## 2020-07-12 NOTE — Telephone Encounter (Signed)
If he is dizzy, that is probably fine in LOW doses.

## 2020-07-12 NOTE — Telephone Encounter (Signed)
Returned care taker Judie Petit) phone call.  Caretaker state that patient has had nausea and vomiting almost daily for 2 months, abnormal BMs.  20lb or more weight loss in the last 2 months and no appetite.  Care taker would like for a prescription of meclizine be sent to pharmacy for patient to take daily.  Care taker also states that patient has a "pimple" for 4-5 days that is not opening or draining.  Advised care taker to not squeeze or mess with bump, use warm compresses and keep clean and dry.  Care taker would like for Ketoconazole cream to be sent to pharmacy to help prevent rash in between legs.  Advised care taker that this area needs to stay clean and dry as much as possible to prevent infection and rash.

## 2020-07-12 NOTE — Telephone Encounter (Signed)
The pt isn't dizzy they want to give it to him for nausea and vomiting.

## 2020-07-14 ENCOUNTER — Encounter: Payer: Self-pay | Admitting: Family Medicine

## 2020-07-14 ENCOUNTER — Other Ambulatory Visit: Payer: Self-pay

## 2020-07-14 ENCOUNTER — Ambulatory Visit (INDEPENDENT_AMBULATORY_CARE_PROVIDER_SITE_OTHER): Payer: Medicare Other | Admitting: Family Medicine

## 2020-07-14 VITALS — BP 93/70 | HR 101 | Temp 98.0°F | Ht 70.0 in | Wt 142.0 lb

## 2020-07-14 DIAGNOSIS — L89322 Pressure ulcer of left buttock, stage 2: Secondary | ICD-10-CM | POA: Diagnosis not present

## 2020-07-14 DIAGNOSIS — R634 Abnormal weight loss: Secondary | ICD-10-CM

## 2020-07-14 NOTE — Progress Notes (Signed)
BP 93/70    Pulse (!) 101    Temp 98 F (36.7 C)    Ht 5\' 10"  (1.778 m)    Wt 142 lb (64.4 kg)    SpO2 96%    BMI 20.37 kg/m    Subjective:   Patient ID: James Hoffman, male    DOB: November 27, 1995, 24 y.o.   MRN: [id number]  HPI: James Hoffman is a 24 y.o. male presenting on 07/14/2020 for Rash and Weight Loss   HPI Patient comes in for weight loss recheck, he has been through time period where he has more nausea and more vomiting over the past couple weeks and then over the past 4 to 5 days that has passed and has been starting to eat more.  He had a 4-day period where he was having a lot more issues.  His caretaker who is here with him today says he is ready to 2 meals a day and been holding it down more and not complaining of the nausea or vomiting or stomach pain is much and seems to be doing well with it.  She denies of having any diarrhea or blood in his stool.  Patient is also coming in for recheck of some pressure sores on his buttocks that they are using Silvadene cream on and seem to be improving.  Patient is wheelchair-bound most of the time with [place].  He gets a pressure sores.  Relevant past medical, surgical, family and social history reviewed and updated as indicated. Interim medical history since our last visit reviewed. Allergies and medications reviewed and updated.  Review of Systems  Constitutional: Positive for appetite change and unexpected weight change. Negative for chills and fever.  Respiratory: Negative for shortness of breath and wheezing.   Cardiovascular: Negative for chest pain and leg swelling.  Musculoskeletal: Negative for back pain and gait problem.  Skin: Positive for wound. Negative for rash.  All other systems reviewed and are negative.   Per HPI unless specifically indicated above   Allergies as of 07/14/2020      Reactions   Tramadol Anaphylaxis   Morphine Nausea And Vomiting   According to mother the patient is intolerant of morphine;  causes nausea and vomiting.  Has had none since he was 24yo.      Medication List       Accurate as of July 14, 2020  2:22 PM. If you have any questions, ask your nurse or doctor.        Cholecalciferol 50 MCG (2000 UT) Caps Take by mouth.   clotrimazole-betamethasone cream Commonly known as: Lotrisone Apply 1 application topically 2 (two) times daily. x7-10   escitalopram 5 MG tablet Commonly known as: LEXAPRO Take 1 tablet (5 mg total) by mouth daily.   feeding supplement (ENSURE COMPLETE) Liqd Take 237 mLs by mouth 2 (two) times daily between meals.   guaiFENesin 100 MG/5ML Soln Commonly known as: ROBITUSSIN Take 5 mLs (100 mg total) by mouth every 4 (four) hours as needed for cough or to loosen phlegm.   levETIRAcetam 750 MG tablet Commonly known as: KEPPRA Take 1,500 mg by mouth in the morning and at bedtime.   levothyroxine 100 MCG tablet Commonly known as: SYNTHROID Take 1 tablet (100 mcg total) by mouth daily before breakfast.   naproxen 500 MG tablet Commonly known as: NAPROSYN Take 1 tablet (500 mg total) by mouth 2 (two) times daily with a meal.   predniSONE 5 MG tablet Commonly known as:  DELTASONE Take 1.5 tablets (7.5 mg total) by mouth daily with breakfast.   silver sulfADIAZINE 1 % cream Commonly known as: SILVADENE Apply 1 application topically daily.   Testosterone 20.25 MG/ACT (1.62%) Gel ONE PUM ON EACH SHOULDER EVERY MORNIG        Objective:   BP 93/70    Pulse (!) 101    Temp 98 F (36.7 C)    Ht 5\' 10"  (1.778 m)    Wt 142 lb (64.4 kg)    SpO2 96%    BMI 20.37 kg/m   Wt Readings from Last 3 Encounters:  07/14/20 142 lb (64.4 kg)  06/24/20 138 lb (62.6 kg)  05/20/20 145 lb (65.8 kg)    Physical Exam Vitals and nursing note reviewed.  Constitutional:      General: He is not in acute distress.    Appearance: He is well-developed. He is not diaphoretic.     Comments: Wheelchair-bound and very thin appearance  Eyes:      General: No scleral icterus.       Right eye: No discharge.     Conjunctiva/sclera: Conjunctivae normal.     Pupils: Pupils are equal, round, and reactive to light.  Neck:     Thyroid: No thyromegaly.  Cardiovascular:     Rate and Rhythm: Normal rate and regular rhythm.     Heart sounds: Normal heart sounds. No murmur heard.   Pulmonary:     Effort: Pulmonary effort is normal. No respiratory distress.     Breath sounds: Normal breath sounds. No wheezing.  Musculoskeletal:        General: Normal range of motion.     Cervical back: Neck supple.  Lymphadenopathy:     Cervical: No cervical adenopathy.  Skin:    General: Skin is warm and dry.     Findings: No rash.       Neurological:     Mental Status: He is alert and oriented to person, place, and time.  Psychiatric:        Behavior: Behavior normal.     Results for orders placed or performed in visit on 06/17/20  CBC with Differential/Platelet  Result Value Ref Range   WBC 14.8 (H) 3.4 - 10.8 x10E3/uL   RBC 4.16 4.14 - 5.80 x10E6/uL   Hemoglobin 14.0 13.0 - 17.7 g/dL   Hematocrit 40.1 37.5 - 51.0 %   MCV 96 79 - 97 fL   MCH 33.7 (H) 26.6 - 33.0 pg   MCHC 34.9 31 - 35 g/dL   RDW 15.1 11.6 - 15.4 %   Platelets 476 (H) 150 - 450 x10E3/uL   Neutrophils 77 Not Estab. %   Lymphs 12 Not Estab. %   Monocytes 6 Not Estab. %   Eos 3 Not Estab. %   Basos 1 Not Estab. %   Neutrophils Absolute 11.5 (H) 1 - 7 x10E3/uL   Lymphocytes Absolute 1.7 0 - 3 x10E3/uL   Monocytes Absolute 0.9 0 - 0 x10E3/uL   EOS (ABSOLUTE) 0.4 0.0 - 0.4 x10E3/uL   Basophils Absolute 0.1 0 - 0 x10E3/uL   Immature Granulocytes 1 Not Estab. %   Immature Grans (Abs) 0.2 (H) 0.0 - 0.1 x10E3/uL    Assessment & Plan:   Problem List Items Addressed This Visit    None    Visit Diagnoses    Pressure injury of left buttock, stage 2 (HCC)    -  Primary   Continue Silvadene cream, seems to be doing  well   Weight loss, unintentional        Patient seems  to be doing better with the weight, is up from 138 pounds and now is at 142 pounds over the past month, he is starting to eat better.  Pressure sore seems to be healing per patient and they are doing Silvadene cream, recommended to continue with that.  Follow up plan: Return in about 4 weeks (around 08/11/2020), or if symptoms worsen or fail to improve, for Follow-up in 4 to 8 weeks with PCP.  Counseling provided for all of the vaccine components No orders of the defined types were placed in this encounter.   Caryl Pina, MD [facility] 07/14/2020, 2:22 PM

## 2020-07-16 ENCOUNTER — Ambulatory Visit (INDEPENDENT_AMBULATORY_CARE_PROVIDER_SITE_OTHER): Payer: Medicare Other | Admitting: *Deleted

## 2020-07-16 DIAGNOSIS — Z87898 Personal history of other specified conditions: Secondary | ICD-10-CM

## 2020-07-16 DIAGNOSIS — F322 Major depressive disorder, single episode, severe without psychotic features: Secondary | ICD-10-CM

## 2020-07-16 DIAGNOSIS — E890 Postprocedural hypothyroidism: Secondary | ICD-10-CM

## 2020-07-16 NOTE — Chronic Care Management (AMB) (Signed)
Chronic Care Management   Follow Up Note   07/16/2020 Name: James Hoffman MRN: [id number] DOB: 1996-08-19  Referred by: [name], DO Reason for referral : Chronic Care Management   James Hoffman is a 24 y.o. year old male who is a primary care patient of [name], DO. The CCM team was consulted for assistance with chronic disease management and care coordination needs.    Review of patient status, including review of consultants reports, relevant laboratory and other test results, and collaboration with appropriate care team members and the patient's provider was performed as part of comprehensive patient evaluation and provision of chronic care management services.    SDOH (Social Determinants of Health) assessments performed: No See Care Plan activities for detailed interventions related to [facility])     Outpatient Encounter Medications as of 07/16/2020  Medication Sig  . Cholecalciferol 50 MCG (2000 UT) CAPS Take by mouth.  . clotrimazole-betamethasone (LOTRISONE) cream Apply 1 application topically 2 (two) times daily. x7-10  . escitalopram (LEXAPRO) 5 MG tablet Take 1 tablet (5 mg total) by mouth daily.  . feeding supplement, ENSURE COMPLETE, (ENSURE COMPLETE) LIQD Take 237 mLs by mouth 2 (two) times daily between meals.  Marland Kitchen guaiFENesin (ROBITUSSIN) 100 MG/5ML SOLN Take 5 mLs (100 mg total) by mouth every 4 (four) hours as needed for cough or to loosen phlegm.  . levETIRAcetam (KEPPRA) 750 MG tablet Take 1,500 mg by mouth in the morning and at bedtime.  Marland Kitchen levothyroxine (SYNTHROID) 100 MCG tablet Take 1 tablet (100 mcg total) by mouth daily before breakfast.  . naproxen (NAPROSYN) 500 MG tablet Take 1 tablet (500 mg total) by mouth 2 (two) times daily with a meal.  . predniSONE (DELTASONE) 5 MG tablet Take 1.5 tablets (7.5 mg total) by mouth daily with breakfast.  . silver sulfADIAZINE (SILVADENE) 1 % cream Apply 1 application topically daily.  .  Testosterone 20.25 MG/ACT (1.62%) GEL ONE PUM ON EACH SHOULDER EVERY MORNIG   No facility-administered encounter medications on file as of 07/16/2020.    RN Care Plan   .  "He wants to be able to walk more" (pt-stated)        CARE PLAN ENTRY (see longitudinal plan of care for additional care plan information)  Current Barriers:  . Limited mobility . Hx of brain tumor  Nurse Case Manager Clinical Goal(s):  Marland Kitchen Over the next 30 days, patient will keep appointment with PCP and discuss physical and occupational therapy options . Over the next 60 days, patient will talk with CCM team regarding mobility concerns  Interventions:  . Inter-disciplinary care team collaboration (see longitudinal plan of care) . Chart reviewed including recent office notes . Talked with grandmother, [name], who is Mujahid's primary caregiver . Discussed mobility limitations and concerns . Discussed braces and assistive devices . Discussed desire to increase mobility . Talked about the possibility of physical/occupational therapy . Reviewed and discussed upcoming appointment with PCP on 07/22/20 o Encouraged patient/family to talk with PCP re: therapy . Encouraged to reach out to [facility] team as needed  Patient Self Care Activities:  . Has assistance with ADLs and IADLs  Initial goal documentation     .  Grandmother: "I'm worried that he's not eating enough" (pt-stated)   On track     [place] (see longitudinal plan of care for additional care plan information)  Current Barriers:  . Care Coordination needs related to nutrition/adequate food intake in a patient with hypothyroidism,  depression, and medulloblastoma  (disease states) . Limited mobility and physical activity . Particular about what foods he eats  Nurse Case Manager Clinical Goal(s):  Marland Kitchen Over the next 60 days, patient will talk with LCSW regarding depression and other psychosocial issues . Over the next 60 days, patient/family will talk with  [facility] regarding decreased appetite . Over the next 90 days, patient will maintain improved appetite and nutritional intake  Interventions:  . Inter-disciplinary care team collaboration (see longitudinal plan of care) . Chart reviewed including recent office notes and lab results . Collaborated with LCSW . Spoke with patient's grandmother who is is primary caregiver . Discussed recent improvement in appetite and food intake . Discussed diet . Discussed 7 lb weight gain . Discussed mobility issues and  . Previously discussed depression as possible cause for decreased appetite . Reviewed upcoming appointment with PCP . Encouraged patient/family to talk with LCSW regarding depression and psychosocial issues . Encouraged patient/ family to reach out to [facility] team as needed  Patient Self Care Activities:  . Unable to perform ADLs independently . Unable to perform IADLs independently  Please see past updates related to this goal by clicking on the "Past Updates" button in the selected goal      .  "We want this abscess to heal"   On track     [place] (see longitudinal plan of care for additional care plan information)  Current Barriers:  . Care Coordination needs related to gluteal abscess in a patient with hypothyroidism, hypotension, blindness/low vision, hx of medullary carcinoma, muscle deconditioning (disease states)  Nurse Case Manager Clinical Goal(s):  Marland Kitchen Over the next 30 days, patient will continue to see wound healing . Over the next 30 days, patient/family will reach out to general surgeon with any new or worsening symptoms  Interventions:  . Inter-disciplinary care team collaboration (see longitudinal plan of care) . Chart reviewed including recent office notes and surgical notes . Talked with grandmother, [name], about wound healing . Discussed improved appetite weight gain . Reviewed upcoming appointments . Encouraged to keep area clean and dry and to rotate  positions at least every 2 hours . Encouraged them to reach out to PCP or surgeon with any new or worsening symptoms . Encouraged to reach out to [facility] team as needed  Patient Self Care Activities:  . Can assist with bathing and toileting . Needs assistance with transportation, walking, and other IADLs  Please see past updates related to this goal by clicking on the "Past Updates" button in the selected goal          Plan:   The care management team will reach out to the patient again over the next 30 days.    Chong Sicilian, BSN, RN-BC Embedded Chronic Care Manager [facility] Direct Dial: [phone]

## 2020-07-16 NOTE — Patient Instructions (Signed)
Visit Information  Goals Addressed              This Visit's Progress     Patient Stated   .  "He wants to be able to walk more" (pt-stated)        CARE PLAN ENTRY (see longitudinal plan of care for additional care plan information)  Current Barriers:  . Limited mobility . Hx of brain tumor  Nurse Case Manager Clinical Goal(s):  Marland Kitchen Over the next 30 days, patient will keep appointment with PCP and discuss physical and occupational therapy options . Over the next 60 days, patient will talk with CCM team regarding mobility concerns  Interventions:  . Inter-disciplinary care team collaboration (see longitudinal plan of care) . Chart reviewed including recent office notes . Talked with grandmother, [name], who is Thorvald's primary caregiver . Discussed mobility limitations and concerns . Discussed braces and assistive devices . Discussed desire to increase mobility . Talked about the possibility of physical/occupational therapy . Reviewed and discussed upcoming appointment with PCP on 07/22/20 o Encouraged patient/family to talk with PCP re: therapy . Encouraged to reach out to [facility] team as needed  Patient Self Care Activities:  . Has assistance with ADLs and IADLs  Initial goal documentation     .  Grandmother: "I'm worried that he's not eating enough" (pt-stated)   On track     [place] (see longitudinal plan of care for additional care plan information)  Current Barriers:  . Care Coordination needs related to nutrition/adequate food intake in a patient with hypothyroidism, depression, and medulloblastoma  (disease states) . Limited mobility and physical activity . Particular about what foods he eats  Nurse Case Manager Clinical Goal(s):  Marland Kitchen Over the next 60 days, patient will talk with LCSW regarding depression and other psychosocial issues . Over the next 60 days, patient/family will talk with [facility] regarding decreased appetite . Over the next 90 days, patient  will maintain improved appetite and nutritional intake  Interventions:  . Inter-disciplinary care team collaboration (see longitudinal plan of care) . Chart reviewed including recent office notes and lab results . Collaborated with LCSW . Spoke with patient's grandmother who is is primary caregiver . Discussed recent improvement in appetite and food intake . Discussed diet . Discussed 7 lb weight gain . Discussed mobility issues and  . Previously discussed depression as possible cause for decreased appetite . Reviewed upcoming appointment with PCP . Encouraged patient/family to talk with LCSW regarding depression and psychosocial issues . Encouraged patient/ family to reach out to [facility] team as needed  Patient Self Care Activities:  . Unable to perform ADLs independently . Unable to perform IADLs independently  Please see past updates related to this goal by clicking on the "Past Updates" button in the selected goal        Other   .  "We want this abscess to heal"   On track     [place] (see longitudinal plan of care for additional care plan information)  Current Barriers:  . Care Coordination needs related to gluteal abscess in a patient with hypothyroidism, hypotension, blindness/low vision, hx of medullary carcinoma, muscle deconditioning (disease states)  Nurse Case Manager Clinical Goal(s):  Marland Kitchen Over the next 30 days, patient will continue to see wound healing . Over the next 30 days, patient/family will reach out to general surgeon with any new or worsening symptoms  Interventions:  . Inter-disciplinary care team collaboration (see longitudinal plan of care) .  Chart reviewed including recent office notes and surgical notes . Talked with grandmother, [name], about wound healing . Discussed improved appetite weight gain . Reviewed upcoming appointments . Encouraged to keep area clean and dry and to rotate positions at least every 2 hours . Encouraged them to reach  out to PCP or surgeon with any new or worsening symptoms . Encouraged to reach out to [facility] team as needed  Patient Self Care Activities:  . Can assist with bathing and toileting . Needs assistance with transportation, walking, and other IADLs  Please see past updates related to this goal by clicking on the "Past Updates" button in the selected goal         Patient verbalizes understanding of instructions provided today.   Follow-up Plan The care management team will reach out to the patient again over the next 30 days.   Chong Sicilian, BSN, RN-BC Embedded Chronic Care Manager [facility] [place] [facility] Direct Dial: [phone]

## 2020-07-21 ENCOUNTER — Ambulatory Visit (INDEPENDENT_AMBULATORY_CARE_PROVIDER_SITE_OTHER): Payer: Medicare Other | Admitting: Family Medicine

## 2020-07-21 ENCOUNTER — Other Ambulatory Visit: Payer: Self-pay

## 2020-07-21 VITALS — BP 115/80 | HR 93 | Temp 97.4°F

## 2020-07-21 DIAGNOSIS — F322 Major depressive disorder, single episode, severe without psychotic features: Secondary | ICD-10-CM

## 2020-07-21 MED ORDER — ESCITALOPRAM OXALATE 5 MG PO TABS: 5.0000 mg | ORAL_TABLET | Freq: Every day | ORAL | 3 refills | Status: DC

## 2020-07-21 NOTE — Progress Notes (Signed)
Subjective: CC: f/u Depression PCP: James Norlander, DO WLN:[id number] James Hoffman is a 24 y.o. male presenting to clinic today for:  1.  Depressive disorder Patient was seen on 06/24/2020 for depressive disorder.  He was started on Lexapro 5 mg daily and instructed follow-up in 4 weeks for recheck.  Is brought today's office visit by his grandmother.  James Hoffman is at home isolating because of potential exposure to Covid.  She notes that he is eating quite a bit better.  He seems to be happier.  Patient reports that he is also feeling better.  No adverse side effects from the medication.   ROS: Per HPI  Allergies  Allergen Reactions  . Tramadol Anaphylaxis  . Morphine Nausea And Vomiting    According to mother the patient is intolerant of morphine; causes nausea and vomiting.  Has had none since he was 24yo.   Past Medical History:  Diagnosis Date  . Adrenal insufficiency ([place])   . 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 Hydrocephalus ([place])   . Osteoporosis   . Thyroid disease     Current Outpatient Medications:  .  Cholecalciferol 50 MCG (2000 UT) CAPS, Take by mouth., Disp: , Rfl:  .  clotrimazole-betamethasone (LOTRISONE) cream, Apply 1 application topically 2 (two) times daily. x7-10, Disp: 30 g, Rfl: 0 .  escitalopram (LEXAPRO) 5 MG tablet, Take 1 tablet (5 mg total) by mouth daily., Disp: 90 tablet, Rfl: 0 .  feeding supplement, ENSURE COMPLETE, (ENSURE COMPLETE) LIQD, Take 237 mLs by mouth 2 (two) times daily between meals., Disp: 500 mL, Rfl: 10 .  guaiFENesin (ROBITUSSIN) 100 MG/5ML SOLN, Take 5 mLs (100 mg total) by mouth every 4 (four) hours as needed for cough or to loosen phlegm., Disp: 236 mL, Rfl: 0 .  levETIRAcetam (KEPPRA) 750 MG tablet, Take 1,500 mg by mouth in the morning and at bedtime., Disp: , Rfl:  .  levothyroxine (SYNTHROID) 100 MCG tablet, Take 1 tablet (100 mcg total) by mouth daily before breakfast., Disp: 95 tablet, Rfl: 3 .  naproxen  (NAPROSYN) 500 MG tablet, Take 1 tablet (500 mg total) by mouth 2 (two) times daily with a meal., Disp: 60 tablet, Rfl: 0 .  predniSONE (DELTASONE) 5 MG tablet, Take 1.5 tablets (7.5 mg total) by mouth daily with breakfast., Disp: , Rfl:  .  silver sulfADIAZINE (SILVADENE) 1 % cream, Apply 1 application topically daily., Disp: 50 g, Rfl: 0 .  Testosterone 20.25 MG/ACT (1.62%) GEL, ONE PUM ON EACH SHOULDER EVERY MORNIG, Disp: 75 g, Rfl: 1 Social History   Socioeconomic History  . Marital status: Single    Spouse name: Not on file  . Number of children: 0  . Years of education: 7th grade  . Highest education level: 7th grade  Occupational History  . Occupation: disabled  Tobacco Use  . Smoking status: Current Every Day Smoker    Packs/day: 0.25    Years: 6.00    Pack years: 1.50    Types: Cigarettes    Start date: 11/25/2012  . Smokeless tobacco: Never Used  Vaping Use  . Vaping Use: Never used  Substance and Sexual Activity  . Alcohol use: No  . Drug use: No  . Sexual activity: Not Currently  Other Topics Concern  . Not on file  Social History Narrative  . Not on file   Social Determinants of Health   Financial Resource Strain: Low Risk   . Difficulty of Paying  Living Expenses: Not very hard  Food Insecurity: No Food Insecurity  . Worried About Charity fundraiser in the Last Year: Never true  . Ran Out of Food in the Last Year: Never true  Transportation Needs: No Transportation Needs  . Lack of Transportation (Medical): No  . Lack of Transportation (Non-Medical): No  Physical Activity: Inactive  . Days of Exercise per Week: 0 days  . Minutes of Exercise per Session: 0 min  Stress: Stress Concern Present  . Feeling of Stress : To some extent  Social Connections: Socially Isolated  . Frequency of Communication with Friends and Family: More than three times a week  . Frequency of Social Gatherings with Friends and Family: More than three times a week  . Attends  Religious Services: Never  . Active Member of Clubs or Organizations: No  . Attends Archivist Meetings: Never  . Marital Status: Never married  Intimate Partner Violence: Not At Risk  . Fear of Current or Ex-Partner: No  . Emotionally Abused: No  . Physically Abused: No  . Sexually Abused: No   No family history on file.  Objective: Office vital signs reviewed. BP 115/80   Pulse 93   Temp (!) 97.4 F (36.3 C) (Temporal)   SpO2 94%   Physical Examination:  General: Awake, alert, chronically ill appearin HEENT: sclera white Cardio: regular rate and rhythm, S1S2 heard, no murmurs appreciated Pulm: clear to auscultation bilaterally, no wheezes, rhonchi or rales; normal work of breathing on room air MSK: arrives in wheelchair Psych: Mood stable.  Patient is interactive with provider.  He appears happy  Depression screen [facility] 2/9 07/21/2020 07/14/2020 06/24/2020  Decreased Interest 0 0 0  Down, Depressed, Hopeless 0 3 3  PHQ - 2 Score 0 3 3  Altered sleeping 0 - 3  Tired, decreased energy 0 - 3  Change in appetite 1 - 3  Feeling bad or failure about yourself  0 - 3  Trouble concentrating 0 - 3  Moving slowly or fidgety/restless 0 - 3  Suicidal thoughts 0 - 0  PHQ-9 Score 1 - 21  Difficult doing work/chores - - Very difficult  Some recent data might be hidden   GAD 7 : Generalized Anxiety Score 07/21/2020 05/20/2020 02/20/2020  Nervous, Anxious, on Edge 0 3 1  Control/stop worrying 0 3 1  Worry too much - different things 0 3 1  Trouble relaxing 0 3 1  Restless 0 3 0  Easily annoyed or irritable 1 3 0  Afraid - awful might happen 0 2 0  Total GAD 7 Score 1 20 4   Anxiety Difficulty Not difficult at all Very difficult Somewhat difficult   Assessment/ Plan: 24 y.o. male   1. Depression, major, single episode, severe (HCC) PHQ and GAD-7 improving greatly since last check.  He seems to be responding to the Lexapro really well and that has increased appetite.  I have  renewed the medication and he may follow-up as needed on this issue.  Caution NSAID with SSRI - escitalopram (LEXAPRO) 5 MG tablet; Take 1 tablet (5 mg total) by mouth daily.  Dispense: 90 tablet; Refill: 3   No orders of the defined types were placed in this encounter.  No orders of the defined types were placed in this encounter.    James Norlander, DO [place] [phone]

## 2020-07-26 ENCOUNTER — Telehealth: Payer: Self-pay | Admitting: Family Medicine

## 2020-07-26 NOTE — Telephone Encounter (Signed)
Is the patient wanting refill for Naprosyn? Or refill for all medication? please clarify and I will get it done. thanks

## 2020-07-26 NOTE — Telephone Encounter (Signed)
  Prescription Request  07/26/2020  What is the name of the medication or equipment? Naprosyn  Have you contacted your pharmacy to request a refill? (if applicable) Yes  Which pharmacy would you like this sent to? [facility]  Pts aunt called stating that pt just had a visit with Dr Lajuana Ripple on 07/21/20 but no refills were called in for this med. Requesting refills.   Patient notified that their request is being sent to the clinical staff for review and that they should receive a response within 2 business days.

## 2020-07-26 NOTE — Telephone Encounter (Signed)
Since you prescribed this last and Dr. Darnell Level is not here?

## 2020-07-27 MED ORDER — NAPROXEN 500 MG PO TABS: 500.0000 mg | ORAL_TABLET | Freq: Two times a day (BID) | ORAL | 0 refills | Status: DC

## 2020-07-27 NOTE — Telephone Encounter (Signed)
Patient is needing refill on Naprosyn at this time.  Caregiver will call back if patient needs refills on any further refills.  Caregiver would also like to know the long term side effects of this medication and if he will be ok to take this long term.  Caregiver does not want patient on a narcotic.  Advised caregiver that Naprosyn is not a narcotic.

## 2020-07-27 NOTE — Telephone Encounter (Signed)
I sent naproxen for the patient

## 2020-08-03 ENCOUNTER — Ambulatory Visit: Payer: Medicare Other | Admitting: Licensed Clinical Social Worker

## 2020-08-03 DIAGNOSIS — F322 Major depressive disorder, single episode, severe without psychotic features: Secondary | ICD-10-CM | POA: Diagnosis not present

## 2020-08-03 DIAGNOSIS — C716 Malignant neoplasm of cerebellum: Secondary | ICD-10-CM

## 2020-08-03 DIAGNOSIS — C801 Malignant (primary) neoplasm, unspecified: Secondary | ICD-10-CM

## 2020-08-03 DIAGNOSIS — E890 Postprocedural hypothyroidism: Secondary | ICD-10-CM | POA: Diagnosis not present

## 2020-08-03 DIAGNOSIS — Z87898 Personal history of other specified conditions: Secondary | ICD-10-CM

## 2020-08-03 DIAGNOSIS — M818 Other osteoporosis without current pathological fracture: Secondary | ICD-10-CM

## 2020-08-03 NOTE — Chronic Care Management (AMB) (Signed)
Chronic Care Management    Clinical Social Work Follow Up Note  08/03/2020 Name: James Hoffman MRN: [id number] DOB: 1996-02-15  James Hoffman is a 25 y.o. year old male who is a primary care patient of James Norlander, DO. The CCM team was consulted for assistance with [name]   Review of patient status, including review of consultants reports, other relevant assessments, and collaboration with appropriate care team members and the patient's provider was performed as part of comprehensive patient evaluation and provision of chronic care management services.    SDOH (Social Determinants of Health) assessments performed: No; risk for social isolation; risk for tobacco use; risk for depression; risk for stress; risk for physical inactivity    Office Visit from 07/21/2020 in [place]  PHQ-9 Total Score 1       GAD 7 : Generalized Anxiety Score 07/21/2020 05/20/2020 02/20/2020  Nervous, Anxious, on Edge 0 3 1  Control/stop worrying 0 3 1  Worry too much - different things 0 3 1  Trouble relaxing 0 3 1  Restless 0 3 0  Easily annoyed or irritable 1 3 0  Afraid - awful might happen 0 2 0  Total GAD 7 Score 1 20 4   Anxiety Difficulty Not difficult at all Very difficult Somewhat difficult    Outpatient Encounter Medications as of 08/03/2020  Medication Sig  . Cholecalciferol 50 MCG (2000 UT) CAPS Take by mouth.  . clotrimazole-betamethasone (LOTRISONE) cream Apply 1 application topically 2 (two) times daily. x7-10  . escitalopram (LEXAPRO) 5 MG tablet Take 1 tablet (5 mg total) by mouth daily.  Marland Kitchen guaiFENesin (ROBITUSSIN) 100 MG/5ML SOLN Take 5 mLs (100 mg total) by mouth every 4 (four) hours as needed for cough or to loosen phlegm. (Patient not taking: Reported on 07/21/2020)  . levETIRAcetam (KEPPRA) 750 MG tablet Take 1,500 mg by mouth in the morning and at bedtime.  Marland Kitchen levothyroxine (SYNTHROID) 100 MCG tablet Take 1 tablet (100 mcg total) by  mouth daily before breakfast.  . naproxen (NAPROSYN) 500 MG tablet Take 1 tablet (500 mg total) by mouth 2 (two) times daily with a meal.  . predniSONE (DELTASONE) 5 MG tablet Take 1.5 tablets (7.5 mg total) by mouth daily with breakfast.  . silver sulfADIAZINE (SILVADENE) 1 % cream Apply 1 application topically daily.  . Testosterone 20.25 MG/ACT (1.62%) GEL ONE PUM ON EACH SHOULDER EVERY MORNIG   No facility-administered encounter medications on file as of 08/03/2020.    Goals    .  Client or family representative to talk with LCSW in next 30 days to discuss client daily needs, ADLs neds, and needs regarding wheelchair ramp (pt-stated)      CARE PLAN ENTRY   Current Barriers:  . Wheelchair bound client with Chronic Diagnoses of Medulloblastoma, Osteoporosis, , Hypothyroidism, Hx brain tumor, medullary carcinoma . Hearing deficits . Communication challenges  Clinical Social Work Clinical Goal(s):  . Client/family representative to talk with LCSW in next 30 days to discuss client daily needs, ADL needs and needs regarding wheelchair ramp  Interventions: . Talked with James Hoffman, aunt of client about Guardianship information for client . Talked with James Hoffman about ADTS contact, James Hoffman, related to ramp construction information . Talked with James Hoffman, aunt of client about RNCM support with CCM program . Talked with James Hoffman, aunt of client about client relaxation techniques (listens to music, watches favorite TV shows, enjoys spending time with family members) . Talked with James Hoffman  James Hoffman, aunt of client, about DME equipment of client (has wheelchair, Recruitment consultant) . Talked with James Hoffman, aunt of client , about weight issues of client and appetite of client . Talked with James Hoffman about client visit with Dr. Lajuana Hoffman on 07/21/2020. . Talked with James Hoffman about vision of client . Talked with James Hoffman about ambulation needs of client (uses a wheelchair  to ambulate) . Talked with James Hoffman about skin issues of client James Hoffman stated that client often will pull sheets off his hospital bed and thus the bed may rub directly against his skin and cause skin issues) . Talked with James Hoffman about mood of client . Talked with James Hoffman about client's upcoming medical appointments   Patient Self Care Activities:  Feeds self with set up  Self Care Deficits Mobility deficits Wheelchair dependent  Initial goal documentation    Follow Up Plan:  LCSW to call client orfamily member in next 4 weeks to discuss client needs at that time  James Hoffman MSW, LCSW Licensed Clinical Social Worker [facility] [phone]

## 2020-08-03 NOTE — Patient Instructions (Addendum)
Licensed Clinical Education officer, museum Visit Information  Goals we discussed today:    Client or family representative to talk with LCSW in next 30 days to discuss client daily needs, ADLs neds, and needs regarding wheelchair ramp (pt-stated)        CARE PLAN ENTRY   Current Barriers:   Wheelchair bound client with Chronic Diagnoses of Medulloblastoma, Osteoporosis, , Hypothyroidism, Hx brain tumor, medullary carcinoma  Hearing deficits  Communication challenges  Clinical Social Work Clinical Goal(s):   Client/family representative to talk with LCSW in next 30 days to discuss client daily needs, ADL needs and needs regarding wheelchair ramp  Interventions:  Talked with [name], aunt of client about Guardianship information for client  Talked with [name] about ADTS contact, Waymond Cera, related to ramp construction information  Talked with [name], aunt of client about RNCM support with CCM program  Talked with [name], aunt of client about client relaxation techniques (listens to music, watches favorite TV shows, enjoys spending time with family members)  Talked with [name], aunt of client, about DME equipment of client (has wheelchair, shower chair,walker)  Talked with [name], aunt of client , about weight issues of client and appetite of client  Talked with [name] about client visit with Dr. Lajuana Ripple on 07/21/2020.  Talked with [name] about vision of client  Talked with [name] about ambulation needs of client (uses a wheelchair to ambulate)  Talked with [name] about skin issues of client [name] stated that client often will pull sheets off his hospital bed and thus the bed may rub directly against his skin and cause skin issues)  Talked with [name] about mood of client  Talked with [name] about client's upcoming medical appointments   Patient Self Care Activities:  Feeds self with set up  Self Care  Deficits Mobility deficits Wheelchair dependent  Initial goal documentation    Follow Up Plan: LCSW to call client orfamily member in next 4 weeks to discuss client needs at that time  Materials Provided: No  The patient/Courtney Florene Glen, aunt of client,verbalized understanding of instructions provided today and declined a print copy of patient instruction materials.   [name].Jourdan Durbin MSW, LCSW Licensed Clinical Social Worker [facility] [phone]

## 2020-08-06 ENCOUNTER — Telehealth: Payer: Self-pay | Admitting: Family Medicine

## 2020-08-06 DIAGNOSIS — R29898 Other symptoms and signs involving the musculoskeletal system: Secondary | ICD-10-CM

## 2020-08-06 NOTE — Telephone Encounter (Signed)
REFERRAL REQUEST Telephone Note  Have you been seen at our office for this problem? yes (Advise that they may need an appointment with their PCP before a referral can be done)  Reason for Referral: measure muscle mass and build muscle mass Referral discussed with patient: yes Best contact number of patient for referral team: [phone]   Has patient been seen by a specialist for this issue before: yes Patient provider preference for referral: next door Patient location preference for referral: therapy next door   Patient notified that referrals can take up to a week or longer to process. If they haven't heard anything within a week they should call back and speak with the referral department.

## 2020-08-10 NOTE — Telephone Encounter (Signed)
done

## 2020-08-15 DIAGNOSIS — S93401A Sprain of unspecified ligament of right ankle, initial encounter: Secondary | ICD-10-CM | POA: Diagnosis not present

## 2020-08-15 DIAGNOSIS — D72829 Elevated white blood cell count, unspecified: Secondary | ICD-10-CM | POA: Insufficient documentation

## 2020-08-15 DIAGNOSIS — M25571 Pain in right ankle and joints of right foot: Secondary | ICD-10-CM | POA: Diagnosis not present

## 2020-08-15 DIAGNOSIS — Z7409 Other reduced mobility: Secondary | ICD-10-CM | POA: Insufficient documentation

## 2020-08-15 DIAGNOSIS — S9001XA Contusion of right ankle, initial encounter: Secondary | ICD-10-CM | POA: Diagnosis not present

## 2020-08-19 ENCOUNTER — Other Ambulatory Visit: Payer: Self-pay

## 2020-08-19 ENCOUNTER — Ambulatory Visit (INDEPENDENT_AMBULATORY_CARE_PROVIDER_SITE_OTHER): Payer: Medicare Other | Admitting: Nurse Practitioner

## 2020-08-19 DIAGNOSIS — R059 Cough, unspecified: Secondary | ICD-10-CM | POA: Diagnosis not present

## 2020-08-19 MED ORDER — AMOXICILLIN-POT CLAVULANATE 875-125 MG PO TABS: 1.0000 | ORAL_TABLET | Freq: Two times a day (BID) | ORAL | 0 refills | Status: DC

## 2020-08-19 NOTE — Progress Notes (Signed)
   Virtual Visit via telephone Note Due to COVID-19 pandemic this visit was conducted virtually. This visit type was conducted due to national recommendations for restrictions regarding the COVID-19 Pandemic (e.g. social distancing, sheltering in place) in an effort to limit this patient's exposure and mitigate transmission in our community. All issues noted in this document were discussed and addressed.  A physical exam was not performed with this format.  I connected with James Hoffman on 08/19/20 at 4:50 by telephone and verified that I am speaking with the correct person using two identifiers. James Hoffman is currently located at home and his caregiver is currently with him during visit. The provider, [name], FNP is located in their office at time of visit.  I discussed the limitations, risks, security and privacy concerns of performing an evaluation and management service by telephone and the availability of in person appointments. I also discussed with the patient that there may be a patient responsible charge related to this service. The patient expressed understanding and agreed to proceed.   History and Present Illness:   Chief Complaint: Nasal Congestion   HPI Patients caregiver called in stating that he has had lots of congestion. And cough. Cough is productive. No SOB. No fever. No body aches that are new for him. He is in a wheelchair and has hard time getting congestion up. Develop anemia easily   Review of Systems  Constitutional: Negative for chills and fever.  HENT: Positive for congestion. Negative for sinus pain.   Respiratory: Positive for cough.   Neurological: Negative for dizziness and headaches.  All other systems reviewed and are negative.    Observations/Objective: Could not speak with patient due to his physical condition  Assessment and Plan: James Hoffman in today with chief complaint of Nasal Congestion   1. Cough Chest  PT discussed mucinex OTC Force fluids RTO prn - amoxicillin-clavulanate (AUGMENTIN) 875-125 MG tablet; Take 1 tablet by mouth 2 (two) times daily.  Dispense: 14 tablet; Refill: 0     Follow Up Instructions: prn    I discussed the assessment and treatment plan with the patient. The patient was provided an opportunity to ask questions and all were answered. The patient agreed with the plan and demonstrated an understanding of the instructions.   The patient was advised to call back or seek an in-person evaluation if the symptoms worsen or if the condition fails to improve as anticipated.  The above assessment and management plan was discussed with the patient. The patient verbalized understanding of and has agreed to the management plan. Patient is aware to call the clinic if symptoms persist or worsen. Patient is aware when to return to the clinic for a follow-up visit. Patient educated on when it is appropriate to go to the emergency department.   Time call ended:  5:07  I provided 17 minutes of non-face-to-face time during this encounter.    [name], FNP

## 2020-08-22 ENCOUNTER — Other Ambulatory Visit: Payer: Self-pay | Admitting: Family Medicine

## 2020-08-23 ENCOUNTER — Telehealth: Payer: Self-pay

## 2020-08-23 ENCOUNTER — Other Ambulatory Visit: Payer: Self-pay | Admitting: "Endocrinology

## 2020-08-23 DIAGNOSIS — E038 Other specified hypothyroidism: Secondary | ICD-10-CM

## 2020-08-23 NOTE — Telephone Encounter (Signed)
Caregiver called in stating that she did a televisit with MMM last week and was given an antibiotic. Patient has been taking and she doesn't see any improvement. She states that he has a lot of rattling in his chest . No fever or any other symptoms. Suggested ER or urgent care for chest xray but wanted to know what you recommend

## 2020-08-23 NOTE — Telephone Encounter (Signed)
Courtney aware and verbalizes understanding per dpr.

## 2020-08-23 NOTE — Telephone Encounter (Signed)
If not already using mucinex, humidification and pushing water, recommend doing so particularly if no fever, hemoptysis or SOB.  Otherwise, agree would get CXR.

## 2020-08-27 DIAGNOSIS — C716 Malignant neoplasm of cerebellum: Secondary | ICD-10-CM | POA: Diagnosis not present

## 2020-08-27 DIAGNOSIS — Z20822 Contact with and (suspected) exposure to covid-19: Secondary | ICD-10-CM | POA: Diagnosis not present

## 2020-08-27 DIAGNOSIS — R32 Unspecified urinary incontinence: Secondary | ICD-10-CM | POA: Diagnosis not present

## 2020-08-27 DIAGNOSIS — S79912A Unspecified injury of left hip, initial encounter: Secondary | ICD-10-CM | POA: Diagnosis not present

## 2020-08-27 DIAGNOSIS — R270 Ataxia, unspecified: Secondary | ICD-10-CM | POA: Diagnosis not present

## 2020-08-27 DIAGNOSIS — I10 Essential (primary) hypertension: Secondary | ICD-10-CM | POA: Diagnosis not present

## 2020-08-27 DIAGNOSIS — R410 Disorientation, unspecified: Secondary | ICD-10-CM | POA: Diagnosis not present

## 2020-08-27 DIAGNOSIS — G9389 Other specified disorders of brain: Secondary | ICD-10-CM | POA: Diagnosis not present

## 2020-08-27 DIAGNOSIS — R531 Weakness: Secondary | ICD-10-CM | POA: Diagnosis not present

## 2020-08-27 DIAGNOSIS — F17210 Nicotine dependence, cigarettes, uncomplicated: Secondary | ICD-10-CM | POA: Diagnosis not present

## 2020-08-27 DIAGNOSIS — M87852 Other osteonecrosis, left femur: Secondary | ICD-10-CM | POA: Diagnosis not present

## 2020-08-27 DIAGNOSIS — Z636 Dependent relative needing care at home: Secondary | ICD-10-CM | POA: Diagnosis not present

## 2020-08-28 DIAGNOSIS — M879 Osteonecrosis, unspecified: Secondary | ICD-10-CM | POA: Diagnosis not present

## 2020-08-28 DIAGNOSIS — R531 Weakness: Secondary | ICD-10-CM | POA: Diagnosis not present

## 2020-08-28 DIAGNOSIS — G9340 Encephalopathy, unspecified: Secondary | ICD-10-CM | POA: Diagnosis not present

## 2020-08-28 DIAGNOSIS — R0989 Other specified symptoms and signs involving the circulatory and respiratory systems: Secondary | ICD-10-CM | POA: Diagnosis not present

## 2020-08-28 DIAGNOSIS — J069 Acute upper respiratory infection, unspecified: Secondary | ICD-10-CM | POA: Diagnosis not present

## 2020-08-28 DIAGNOSIS — C716 Malignant neoplasm of cerebellum: Secondary | ICD-10-CM | POA: Diagnosis not present

## 2020-09-06 ENCOUNTER — Telehealth: Payer: Self-pay

## 2020-09-06 NOTE — Telephone Encounter (Signed)
Looked at referral note looks like they called 3 times to schedule and no return call was made.

## 2020-09-06 NOTE — Telephone Encounter (Signed)
I am totally unaware of need for urologic evaluation.  Last visit was focused on mood, which was stable.  Please set up telephone/ video visit to discuss the urinary incontinence.

## 2020-09-06 NOTE — Telephone Encounter (Signed)
Gave caregiver, [name], phone number for physical therapy next door, she will contact them to schedule an appointment.  Caregiver states she thought she were going to also do a referral to urology for bedwetting.  He is doing this daily now, 2-3 times per day.  Please advise.

## 2020-09-06 NOTE — Telephone Encounter (Signed)
Please give the patient's caregiver the information to call and schedule. A referall is already in place.

## 2020-09-06 NOTE — Telephone Encounter (Signed)
REFERRAL REQUEST Telephone Note  Have you been seen at our office for this problem? yes (Advise that they may need an appointment with their PCP before a referral can be done)  Reason for Referral: wetting bed Referral discussed with patient: yes Best contact number of patient for referral team:  [phone]  Has patient been seen by a specialist for this issue before: no  Patient provider preference for referral: na Patient location preference for referral: na   Patient notified that referrals can take up to a week or longer to process. If they haven't heard anything within a week they should call back and speak with the referral department.

## 2020-09-07 NOTE — Telephone Encounter (Signed)
Left detailed message to call back and schedule appt.

## 2020-09-08 DIAGNOSIS — J690 Pneumonitis due to inhalation of food and vomit: Secondary | ICD-10-CM | POA: Diagnosis not present

## 2020-09-08 DIAGNOSIS — R6520 Severe sepsis without septic shock: Secondary | ICD-10-CM | POA: Diagnosis not present

## 2020-09-08 DIAGNOSIS — G40219 Localization-related (focal) (partial) symptomatic epilepsy and epileptic syndromes with complex partial seizures, intractable, without status epilepticus: Secondary | ICD-10-CM | POA: Diagnosis not present

## 2020-09-08 DIAGNOSIS — J9601 Acute respiratory failure with hypoxia: Secondary | ICD-10-CM | POA: Diagnosis not present

## 2020-09-08 DIAGNOSIS — Z781 Physical restraint status: Secondary | ICD-10-CM | POA: Diagnosis not present

## 2020-09-08 DIAGNOSIS — A419 Sepsis, unspecified organism: Secondary | ICD-10-CM | POA: Diagnosis not present

## 2020-09-08 DIAGNOSIS — Z20822 Contact with and (suspected) exposure to covid-19: Secondary | ICD-10-CM | POA: Diagnosis not present

## 2020-09-08 DIAGNOSIS — R519 Headache, unspecified: Secondary | ICD-10-CM | POA: Diagnosis not present

## 2020-09-09 ENCOUNTER — Ambulatory Visit: Payer: Medicare Other | Admitting: Licensed Clinical Social Worker

## 2020-09-09 DIAGNOSIS — G9340 Encephalopathy, unspecified: Secondary | ICD-10-CM | POA: Diagnosis not present

## 2020-09-09 DIAGNOSIS — F17210 Nicotine dependence, cigarettes, uncomplicated: Secondary | ICD-10-CM | POA: Diagnosis present

## 2020-09-09 DIAGNOSIS — E890 Postprocedural hypothyroidism: Secondary | ICD-10-CM

## 2020-09-09 DIAGNOSIS — C801 Malignant (primary) neoplasm, unspecified: Secondary | ICD-10-CM

## 2020-09-09 DIAGNOSIS — E039 Hypothyroidism, unspecified: Secondary | ICD-10-CM | POA: Diagnosis not present

## 2020-09-09 DIAGNOSIS — E038 Other specified hypothyroidism: Secondary | ICD-10-CM | POA: Diagnosis present

## 2020-09-09 DIAGNOSIS — G911 Obstructive hydrocephalus: Secondary | ICD-10-CM | POA: Diagnosis present

## 2020-09-09 DIAGNOSIS — Z961 Presence of intraocular lens: Secondary | ICD-10-CM | POA: Diagnosis present

## 2020-09-09 DIAGNOSIS — R930 Abnormal findings on diagnostic imaging of skull and head, not elsewhere classified: Secondary | ICD-10-CM | POA: Diagnosis not present

## 2020-09-09 DIAGNOSIS — G40219 Localization-related (focal) (partial) symptomatic epilepsy and epileptic syndromes with complex partial seizures, intractable, without status epilepticus: Secondary | ICD-10-CM | POA: Diagnosis not present

## 2020-09-09 DIAGNOSIS — Z781 Physical restraint status: Secondary | ICD-10-CM | POA: Diagnosis not present

## 2020-09-09 DIAGNOSIS — E2740 Unspecified adrenocortical insufficiency: Secondary | ICD-10-CM | POA: Diagnosis not present

## 2020-09-09 DIAGNOSIS — R531 Weakness: Secondary | ICD-10-CM | POA: Diagnosis not present

## 2020-09-09 DIAGNOSIS — C716 Malignant neoplasm of cerebellum: Secondary | ICD-10-CM | POA: Diagnosis not present

## 2020-09-09 DIAGNOSIS — E230 Hypopituitarism: Secondary | ICD-10-CM | POA: Diagnosis present

## 2020-09-09 DIAGNOSIS — R918 Other nonspecific abnormal finding of lung field: Secondary | ICD-10-CM | POA: Diagnosis not present

## 2020-09-09 DIAGNOSIS — Z982 Presence of cerebrospinal fluid drainage device: Secondary | ICD-10-CM | POA: Diagnosis not present

## 2020-09-09 DIAGNOSIS — R519 Headache, unspecified: Secondary | ICD-10-CM | POA: Diagnosis not present

## 2020-09-09 DIAGNOSIS — J151 Pneumonia due to Pseudomonas: Secondary | ICD-10-CM | POA: Diagnosis not present

## 2020-09-09 DIAGNOSIS — J9811 Atelectasis: Secondary | ICD-10-CM | POA: Diagnosis not present

## 2020-09-09 DIAGNOSIS — A419 Sepsis, unspecified organism: Secondary | ICD-10-CM

## 2020-09-09 DIAGNOSIS — R4182 Altered mental status, unspecified: Secondary | ICD-10-CM | POA: Diagnosis not present

## 2020-09-09 DIAGNOSIS — R451 Restlessness and agitation: Secondary | ICD-10-CM | POA: Diagnosis not present

## 2020-09-09 DIAGNOSIS — Z20822 Contact with and (suspected) exposure to covid-19: Secondary | ICD-10-CM | POA: Diagnosis present

## 2020-09-09 DIAGNOSIS — Z87898 Personal history of other specified conditions: Secondary | ICD-10-CM

## 2020-09-09 DIAGNOSIS — G40909 Epilepsy, unspecified, not intractable, without status epilepticus: Secondary | ICD-10-CM | POA: Diagnosis not present

## 2020-09-09 DIAGNOSIS — L89322 Pressure ulcer of left buttock, stage 2: Secondary | ICD-10-CM | POA: Diagnosis present

## 2020-09-09 DIAGNOSIS — J9601 Acute respiratory failure with hypoxia: Secondary | ICD-10-CM | POA: Diagnosis not present

## 2020-09-09 DIAGNOSIS — E876 Hypokalemia: Secondary | ICD-10-CM | POA: Diagnosis not present

## 2020-09-09 DIAGNOSIS — R633 Feeding difficulties, unspecified: Secondary | ICD-10-CM | POA: Diagnosis not present

## 2020-09-09 DIAGNOSIS — Z4659 Encounter for fitting and adjustment of other gastrointestinal appliance and device: Secondary | ICD-10-CM | POA: Diagnosis not present

## 2020-09-09 DIAGNOSIS — R9431 Abnormal electrocardiogram [ECG] [EKG]: Secondary | ICD-10-CM | POA: Diagnosis not present

## 2020-09-09 DIAGNOSIS — M818 Other osteoporosis without current pathological fracture: Secondary | ICD-10-CM

## 2020-09-09 DIAGNOSIS — J181 Lobar pneumonia, unspecified organism: Secondary | ICD-10-CM | POA: Diagnosis not present

## 2020-09-09 DIAGNOSIS — R1312 Dysphagia, oropharyngeal phase: Secondary | ICD-10-CM | POA: Diagnosis not present

## 2020-09-09 DIAGNOSIS — Z7952 Long term (current) use of systemic steroids: Secondary | ICD-10-CM | POA: Diagnosis not present

## 2020-09-09 DIAGNOSIS — J690 Pneumonitis due to inhalation of food and vomit: Secondary | ICD-10-CM

## 2020-09-09 DIAGNOSIS — F79 Unspecified intellectual disabilities: Secondary | ICD-10-CM | POA: Diagnosis present

## 2020-09-09 DIAGNOSIS — R6520 Severe sepsis without septic shock: Secondary | ICD-10-CM | POA: Diagnosis present

## 2020-09-09 DIAGNOSIS — Z8673 Personal history of transient ischemic attack (TIA), and cerebral infarction without residual deficits: Secondary | ICD-10-CM | POA: Diagnosis not present

## 2020-09-09 DIAGNOSIS — E871 Hypo-osmolality and hyponatremia: Secondary | ICD-10-CM | POA: Diagnosis present

## 2020-09-09 DIAGNOSIS — E2749 Other adrenocortical insufficiency: Secondary | ICD-10-CM | POA: Diagnosis not present

## 2020-09-09 DIAGNOSIS — R29898 Other symptoms and signs involving the musculoskeletal system: Secondary | ICD-10-CM | POA: Insufficient documentation

## 2020-09-09 DIAGNOSIS — H903 Sensorineural hearing loss, bilateral: Secondary | ICD-10-CM | POA: Diagnosis present

## 2020-09-09 DIAGNOSIS — Z9841 Cataract extraction status, right eye: Secondary | ICD-10-CM | POA: Diagnosis not present

## 2020-09-09 DIAGNOSIS — Z85841 Personal history of malignant neoplasm of brain: Secondary | ICD-10-CM | POA: Diagnosis not present

## 2020-09-09 DIAGNOSIS — I771 Stricture of artery: Secondary | ICD-10-CM | POA: Diagnosis not present

## 2020-09-09 DIAGNOSIS — R569 Unspecified convulsions: Secondary | ICD-10-CM | POA: Diagnosis not present

## 2020-09-09 HISTORY — DX: Sepsis, unspecified organism: A41.9

## 2020-09-09 HISTORY — DX: Pneumonitis due to inhalation of food and vomit: J69.0

## 2020-09-09 NOTE — Patient Instructions (Addendum)
Licensed Clinical Social Worker Visit Information  Goals we discussed today:  Goals Addressed              This Visit's Progress   .  Client or family representative to talk with LCSW in next 30 days to discuss client daily needs, ADLs neds, and needs regarding wheelchair ramp (pt-stated)        CARE PLAN ENTRY   Current Barriers:  . Wheelchair bound client with Chronic Diagnoses of Medulloblastoma, Osteoporosis, , Hypothyroidism, Hx brain tumor, medullary carcinoma . Hearing deficits . Communication challenges  Clinical Social Work Clinical Goal(s):  . Client/family representative to talk with LCSW in next 30 days to discuss client daily needs, ADL needs and needs regarding wheelchair ramp  Interventions:   Talked with James Hoffman, aunt about client current needs  Provided counseling support for James Hoffman, aunt and caregiver for client  Talked with James Hoffman about ambulation needs of client  Talked with James Hoffman about RNCM support with CCM program  Talked with James Hoffman about client relaxation techniques (listens to music, watches favorite TV shows, enjoys spending time with family members)  Talked with James Hoffman about DME equipment of client (has wheelchair, Recruitment consultant)  Talked with James Hoffman about client wetting bed and urinary incontinence of client  Talked with James Hoffman about fall prevention for client  Talked with James Hoffman about client current hospitalization at [facility]  Talked with James Hoffman about vision needs of client  Talked with James Hoffman about client recent MRIs and medical tests  Talked with James Hoffman about medical providers providing care for client  Talked with James Hoffman about blood pressure readings of client  Talked with James Hoffman about hearing tests for client  Collaborated with [facility] regarding nursing needs of client  Patient Self Care Activities:  Feeds self with set up  Self Care Deficits Mobility  deficits Wheelchair dependent  Initial goal documentation     Materials Provided: No  Follow Up Plan: LCSW to call client orfamily memberin next 4 weeks to discuss client needs at that time  The patient James Hoffman, aunt of client,verbalized understanding of instructions provided today and declined a print copy of patient instruction materials.   [name].James Hoffman MSW, LCSW Licensed Clinical Social Worker [facility] [phone]

## 2020-09-09 NOTE — Chronic Care Management (AMB) (Signed)
Chronic Care Management    Clinical Social Work Follow Up Note  09/09/2020 Name: James Hoffman MRN: [id number] DOB: 02-11-1996  James Hoffman is a 24 y.o. year old male who is a primary care patient of James Norlander, DO. The CCM team was consulted for assistance with James Hoffman .   Review of patient status, including review of consultants reports, other relevant assessments, and collaboration with appropriate care team members and the patient's provider was performed as part of comprehensive patient evaluation and provision of chronic care management services.    SDOH (Social Determinants of Health) assessments performed: No;risk for social isolation; risk for tobacco use; risk for depression; risk for stress; risk for physical inactivity    Office Visit from 07/21/2020 in [place]  PHQ-9 Total Score 1       GAD 7 : Generalized Anxiety Score 07/21/2020 05/20/2020 02/20/2020  Nervous, Anxious, on Edge 0 3 1  Control/stop worrying 0 3 1  Worry too much - different things 0 3 1  Trouble relaxing 0 3 1  Restless 0 3 0  Easily annoyed or irritable 1 3 0  Afraid - awful might happen 0 2 0  Total GAD 7 Score 1 20 4   Anxiety Difficulty Not difficult at all Very difficult Somewhat difficult    Outpatient Encounter Medications as of 09/09/2020  Medication Sig  . amoxicillin-clavulanate (AUGMENTIN) 875-125 MG tablet Take 1 tablet by mouth 2 (two) times daily.  . Cholecalciferol 50 MCG (2000 UT) CAPS Take by mouth.  . clotrimazole-betamethasone (LOTRISONE) cream Apply 1 application topically 2 (two) times daily. x7-10  . escitalopram (LEXAPRO) 5 MG tablet Take 1 tablet (5 mg total) by mouth daily.  Marland Kitchen guaiFENesin (ROBITUSSIN) 100 MG/5ML SOLN Take 5 mLs (100 mg total) by mouth every 4 (four) hours as needed for cough or to loosen phlegm. (Patient not taking: Reported on 07/21/2020)  . levETIRAcetam (KEPPRA) 750 MG tablet Take 1,500 mg by mouth in the  morning and at bedtime.  Marland Kitchen levothyroxine (SYNTHROID) 100 MCG tablet Take 1 tablet (100 mcg total) by mouth daily before breakfast.  . naproxen (NAPROSYN) 500 MG tablet Take 1 tablet (500 mg total) by mouth 2 (two) times daily as needed for moderate pain.  . predniSONE (DELTASONE) 5 MG tablet Take 1.5 tablets (7.5 mg total) by mouth daily with breakfast.  . silver sulfADIAZINE (SILVADENE) 1 % cream Apply 1 application topically daily.  . Testosterone 20.25 MG/ACT (1.62%) GEL ONE PUM ON EACH SHOULDER EVERY MORNIG   No facility-administered encounter medications on file as of 09/09/2020.    Goals Addressed              This Visit's Progress   .  Client or family representative to talk with LCSW in next 30 days to discuss client daily needs, ADLs neds, and needs regarding wheelchair ramp (pt-stated)        CARE PLAN ENTRY   Current Barriers:  . Wheelchair bound client with Chronic Diagnoses of Medulloblastoma, Osteoporosis, , Hypothyroidism, Hx brain tumor, medullary carcinoma . Hearing deficits . Communication challenges  Clinical Social Work Clinical Goal(s):  . Client/family representative to talk with LCSW in next 30 days to discuss client daily needs, ADL needs and needs regarding wheelchair ramp  Interventions: . Talked with James Hoffman, aunt about client current needs . Provided counseling support for James Hoffman, aunt and caregiver for client . Talked with James Hoffman about ambulation needs of client . Talked with James Hoffman about  RNCM support with CCM program . Talked with James Hoffman about client relaxation techniques (listens to music, watches favorite TV shows, enjoys spending time with family members) . Talked with James Hoffman about DME equipment of client (has wheelchair, Recruitment consultant) . Talked with James Hoffman about client wetting bed and urinary incontinence of client . Talked with James Hoffman about fall prevention for client . Talked with James Hoffman about client current  hospitalization at [facility] . Talked with James Hoffman about vision needs of client . Talked with James Hoffman about client recent MRIs and medical tests . Talked with James Hoffman about medical providers providing care for client . Talked with James Hoffman about blood pressure readings of client  Talked with James Hoffman about hearing tests for client  Collaborated with [facility] regarding nursing needs of client  Patient Self Care Activities:  Feeds self with set up  Self Care Deficits Mobility deficits Wheelchair dependent    Initial goal documentation        Follow Up Plan: LCSW to call client orfamily member in next 4 weeks to discuss client needs at that time  James Hoffman.Honi Name MSW, LCSW Licensed Clinical Social Worker [facility] Care Management [phone]

## 2020-09-13 DIAGNOSIS — G40219 Localization-related (focal) (partial) symptomatic epilepsy and epileptic syndromes with complex partial seizures, intractable, without status epilepticus: Secondary | ICD-10-CM | POA: Insufficient documentation

## 2020-09-17 ENCOUNTER — Ambulatory Visit: Payer: Medicare Other | Admitting: *Deleted

## 2020-09-17 DIAGNOSIS — C716 Malignant neoplasm of cerebellum: Secondary | ICD-10-CM

## 2020-09-17 NOTE — Chronic Care Management (AMB) (Signed)
  Chronic Care Management   Note  09/17/2020 Name: James Hoffman MRN: [id number] DOB: November 19, 1995  Patient scheduled for [place] follow-up call but is currently hospitalized at [facility]. Previously collaborated with Theadore Nan, LCSW about change in health status and prior hospitalization. CCM team will reach out to family after hospital discharge to assess needs.   Follow up plan: The care management team will reach out to the patient again over the next 30 days.   Chong Sicilian, BSN, RN-BC Embedded Chronic Care Manager [facility] / [place] Management Direct Dial: [phone]

## 2020-09-20 DIAGNOSIS — J189 Pneumonia, unspecified organism: Secondary | ICD-10-CM | POA: Diagnosis not present

## 2020-09-20 DIAGNOSIS — E2740 Unspecified adrenocortical insufficiency: Secondary | ICD-10-CM | POA: Diagnosis not present

## 2020-09-20 DIAGNOSIS — F17200 Nicotine dependence, unspecified, uncomplicated: Secondary | ICD-10-CM | POA: Diagnosis not present

## 2020-09-20 DIAGNOSIS — E038 Other specified hypothyroidism: Secondary | ICD-10-CM | POA: Diagnosis not present

## 2020-09-20 DIAGNOSIS — C716 Malignant neoplasm of cerebellum: Secondary | ICD-10-CM | POA: Diagnosis not present

## 2020-09-20 DIAGNOSIS — Z7982 Long term (current) use of aspirin: Secondary | ICD-10-CM | POA: Diagnosis not present

## 2020-09-20 DIAGNOSIS — L89152 Pressure ulcer of sacral region, stage 2: Secondary | ICD-10-CM | POA: Diagnosis not present

## 2020-09-20 DIAGNOSIS — Z8673 Personal history of transient ischemic attack (TIA), and cerebral infarction without residual deficits: Secondary | ICD-10-CM | POA: Diagnosis not present

## 2020-09-20 DIAGNOSIS — Z85841 Personal history of malignant neoplasm of brain: Secondary | ICD-10-CM | POA: Diagnosis not present

## 2020-09-20 DIAGNOSIS — D649 Anemia, unspecified: Secondary | ICD-10-CM | POA: Diagnosis not present

## 2020-09-20 DIAGNOSIS — N189 Chronic kidney disease, unspecified: Secondary | ICD-10-CM | POA: Diagnosis not present

## 2020-09-20 DIAGNOSIS — M1990 Unspecified osteoarthritis, unspecified site: Secondary | ICD-10-CM | POA: Diagnosis not present

## 2020-09-20 DIAGNOSIS — H9192 Unspecified hearing loss, left ear: Secondary | ICD-10-CM | POA: Diagnosis not present

## 2020-09-20 DIAGNOSIS — G40209 Localization-related (focal) (partial) symptomatic epilepsy and epileptic syndromes with complex partial seizures, not intractable, without status epilepticus: Secondary | ICD-10-CM | POA: Diagnosis not present

## 2020-09-20 DIAGNOSIS — A419 Sepsis, unspecified organism: Secondary | ICD-10-CM | POA: Diagnosis not present

## 2020-09-24 DIAGNOSIS — A419 Sepsis, unspecified organism: Secondary | ICD-10-CM | POA: Diagnosis not present

## 2020-09-24 DIAGNOSIS — E038 Other specified hypothyroidism: Secondary | ICD-10-CM | POA: Diagnosis not present

## 2020-09-24 DIAGNOSIS — C716 Malignant neoplasm of cerebellum: Secondary | ICD-10-CM | POA: Diagnosis not present

## 2020-09-24 DIAGNOSIS — J189 Pneumonia, unspecified organism: Secondary | ICD-10-CM | POA: Diagnosis not present

## 2020-09-24 DIAGNOSIS — L89152 Pressure ulcer of sacral region, stage 2: Secondary | ICD-10-CM | POA: Diagnosis not present

## 2020-09-24 DIAGNOSIS — G40209 Localization-related (focal) (partial) symptomatic epilepsy and epileptic syndromes with complex partial seizures, not intractable, without status epilepticus: Secondary | ICD-10-CM | POA: Diagnosis not present

## 2020-09-28 ENCOUNTER — Telehealth: Payer: Self-pay

## 2020-09-28 NOTE — Telephone Encounter (Signed)
Pt scheduled with Dr Darnell Level 10/20/20 at 11:00.

## 2020-09-28 NOTE — Telephone Encounter (Signed)
REFERRAL REQUEST Telephone Note  Have you been seen at our office for this problem? yes (Advise that they may need an appointment with their PCP before a referral can be done)  Reason for Referral: Frequent urinating Referral discussed with patient: yes Best contact number of patient for referral team:   [phone] Has patient been seen by a specialist for this issue before: yes this has been discussed with Dr. Darnell Level. And was told that referral would be put in for him to a urologist Patient provider preference for referral: [facility] Patient location preference for referral: Prefers to go to [facility]   Patient notified that referrals can take up to a week or longer to process. If they haven't heard anything within a week they should call back and speak with the referral department.

## 2020-09-28 NOTE — Telephone Encounter (Signed)
Please see my 10/25 telephone note

## 2020-09-29 DIAGNOSIS — C716 Malignant neoplasm of cerebellum: Secondary | ICD-10-CM | POA: Diagnosis not present

## 2020-09-29 DIAGNOSIS — L89152 Pressure ulcer of sacral region, stage 2: Secondary | ICD-10-CM | POA: Diagnosis not present

## 2020-09-29 DIAGNOSIS — G40209 Localization-related (focal) (partial) symptomatic epilepsy and epileptic syndromes with complex partial seizures, not intractable, without status epilepticus: Secondary | ICD-10-CM | POA: Diagnosis not present

## 2020-09-29 DIAGNOSIS — A419 Sepsis, unspecified organism: Secondary | ICD-10-CM | POA: Diagnosis not present

## 2020-09-29 DIAGNOSIS — J189 Pneumonia, unspecified organism: Secondary | ICD-10-CM | POA: Diagnosis not present

## 2020-09-29 DIAGNOSIS — E038 Other specified hypothyroidism: Secondary | ICD-10-CM | POA: Diagnosis not present

## 2020-10-01 DIAGNOSIS — J189 Pneumonia, unspecified organism: Secondary | ICD-10-CM | POA: Diagnosis not present

## 2020-10-01 DIAGNOSIS — A419 Sepsis, unspecified organism: Secondary | ICD-10-CM | POA: Diagnosis not present

## 2020-10-01 DIAGNOSIS — C716 Malignant neoplasm of cerebellum: Secondary | ICD-10-CM | POA: Diagnosis not present

## 2020-10-01 DIAGNOSIS — E038 Other specified hypothyroidism: Secondary | ICD-10-CM | POA: Diagnosis not present

## 2020-10-01 DIAGNOSIS — G40209 Localization-related (focal) (partial) symptomatic epilepsy and epileptic syndromes with complex partial seizures, not intractable, without status epilepticus: Secondary | ICD-10-CM | POA: Diagnosis not present

## 2020-10-01 DIAGNOSIS — L89152 Pressure ulcer of sacral region, stage 2: Secondary | ICD-10-CM | POA: Diagnosis not present

## 2020-10-06 ENCOUNTER — Other Ambulatory Visit: Payer: Self-pay

## 2020-10-06 ENCOUNTER — Telehealth: Payer: Self-pay

## 2020-10-06 ENCOUNTER — Ambulatory Visit: Payer: Self-pay | Admitting: Licensed Clinical Social Worker

## 2020-10-06 DIAGNOSIS — F322 Major depressive disorder, single episode, severe without psychotic features: Secondary | ICD-10-CM

## 2020-10-06 DIAGNOSIS — Z87898 Personal history of other specified conditions: Secondary | ICD-10-CM

## 2020-10-06 DIAGNOSIS — M818 Other osteoporosis without current pathological fracture: Secondary | ICD-10-CM

## 2020-10-06 DIAGNOSIS — E890 Postprocedural hypothyroidism: Secondary | ICD-10-CM

## 2020-10-06 DIAGNOSIS — C716 Malignant neoplasm of cerebellum: Secondary | ICD-10-CM

## 2020-10-06 DIAGNOSIS — C801 Malignant (primary) neoplasm, unspecified: Secondary | ICD-10-CM

## 2020-10-06 DIAGNOSIS — I9589 Other hypotension: Secondary | ICD-10-CM

## 2020-10-06 MED ORDER — NAPROXEN 500 MG PO TABS: 500.0000 mg | ORAL_TABLET | Freq: Two times a day (BID) | ORAL | 3 refills | Status: DC | PRN

## 2020-10-06 NOTE — Telephone Encounter (Signed)
Prednisone comes from his specialist.  Please send rx refill request there.  Naprosyn renewed.

## 2020-10-06 NOTE — Chronic Care Management (AMB) (Signed)
Chronic Care Management    Clinical Social Work Follow Up Note  10/06/2020 Name: GERSON FAUTH MRN: [id number] DOB: 1996/02/09  [name] is a 24 y.o. year old male who is a primary care patient of [name], DO. The CCM team was consulted for assistance with [name]   Review of patient status, including review of consultants reports, other relevant assessments, and collaboration with appropriate care team members and the patient's provider was performed as part of comprehensive patient evaluation and provision of chronic care management services.    SDOH (Social Determinants of Health) assessments performed: No; risk for depression; risk for tobacco use; risk for social isolation; risk for stress; risk for physical inactivity    Office Visit from 07/21/2020 in [place]  PHQ-9 Total Score 1       GAD 7 : Generalized Anxiety Score 07/21/2020 05/20/2020 02/20/2020  Nervous, Anxious, on Edge 0 3 1  Control/stop worrying 0 3 1  Worry too much - different things 0 3 1  Trouble relaxing 0 3 1  Restless 0 3 0  Easily annoyed or irritable 1 3 0  Afraid - awful might happen 0 2 0  Total GAD 7 Score 1 20 4   Anxiety Difficulty Not difficult at all Very difficult Somewhat difficult    Outpatient Encounter Medications as of 10/06/2020  Medication Sig  . amoxicillin-clavulanate (AUGMENTIN) 875-125 MG tablet Take 1 tablet by mouth 2 (two) times daily.  . Cholecalciferol 50 MCG (2000 UT) CAPS Take by mouth.  . clotrimazole-betamethasone (LOTRISONE) cream Apply 1 application topically 2 (two) times daily. x7-10  . escitalopram (LEXAPRO) 5 MG tablet Take 1 tablet (5 mg total) by mouth daily.  Marland Kitchen guaiFENesin (ROBITUSSIN) 100 MG/5ML SOLN Take 5 mLs (100 mg total) by mouth every 4 (four) hours as needed for cough or to loosen phlegm. (Patient not taking: Reported on 07/21/2020)  . levETIRAcetam (KEPPRA) 750 MG tablet Take 1,500 mg by mouth in the  morning and at bedtime.  Marland Kitchen levothyroxine (SYNTHROID) 100 MCG tablet Take 1 tablet (100 mcg total) by mouth daily before breakfast.  . naproxen (NAPROSYN) 500 MG tablet Take 1 tablet (500 mg total) by mouth 2 (two) times daily as needed for moderate pain.  . predniSONE (DELTASONE) 5 MG tablet Take 1.5 tablets (7.5 mg total) by mouth daily with breakfast.  . silver sulfADIAZINE (SILVADENE) 1 % cream Apply 1 application topically daily.  . Testosterone 20.25 MG/ACT (1.62%) GEL ONE PUM ON EACH SHOULDER EVERY MORNIG   No facility-administered encounter medications on file as of 10/06/2020.    Goals    .  Client or family representative to talk with LCSW in next 30 days to discuss client daily needs, ADLs needs, and needs regarding wheelchair ramp (pt-stated)      CARE PLAN ENTRY   Current Barriers:  . Wheelchair bound client with Chronic Diagnoses of Medulloblastoma, Osteoporosis, , Hypothyroidism, Hx brain tumor, medullary carcinoma . Hearing deficits . Communication challenges  Clinical Social Work Clinical Goal(s):  . Client/family representative to talk with LCSW in next 30 days to discuss client daily needs, ADL needs and needs regarding wheelchair ramp  Interventions: . Talked with [name], client's grandmother ,about client current needs . Talked with [name] about ambulation needs of client . Talked with [name] about possible support for client with Dr. Lottie Dawson , Pharmacist [name] had many medication questions related to client's medications; LCSW encouraged [name] to talk with Dr. Almyra Free  [place] Pharmacist about these medication questions  Encouraged [name] to talk with [facility] Triage Nurse as needed related to nursing needs of client   Patient Self Care Activities:  Feeds self with set up  Self Care Deficits Mobility deficits Wheelchair dependent    Initial goal documentation     Follow Up Plan: LCSW to call client or [name], grandmother in next 4 weeks to  discuss client needs at that time  Shin Lamour.Demara Lover MSW, LCSW Licensed Clinical Social Worker [facility] Care Management [phone]

## 2020-10-06 NOTE — Telephone Encounter (Signed)
  Prescription Request  10/06/2020  What is the name of the medication or equipment? predniSONE (DELTASONE) 5 MG tablet  naproxen (NAPROSYN) 500 MG tablet    Have you contacted your pharmacy to request a refill? (if applicable) yes  Which [facility] you [facility] this sent to[facility]   Patient notified that their request is being sent to the clinical staff for review and that they should receive a response within 2 business days.

## 2020-10-06 NOTE — Telephone Encounter (Signed)
Patient's caregiver is aware 

## 2020-10-06 NOTE — Patient Instructions (Addendum)
Licensed Clinical Social Worker Visit Information  Goals we discussed today:  .  Client or family representative to talk with LCSW in next 30 days to discuss client daily needs, ADLs needs, and needs regarding wheelchair ramp (pt-stated)        CARE PLAN ENTRY   Current Barriers:   Wheelchair bound client with Chronic Diagnoses of Medulloblastoma, Osteoporosis, , Hypothyroidism, Hx brain tumor, medullary carcinoma  Hearing deficits  Communication challenges  Clinical Social Work Clinical Goal(s):   Client/family representative to talk with LCSW in next 30 days to discuss client daily needs, ADL needs and needs regarding wheelchair ramp  Interventions:  Talked with [name], client's grandmother ,about client current needs  Talked with [name] about ambulation needs of client  Talked with [name] about possible support for client with Dr. Lottie Dawson , Pharmacist [name] had many medication questions related to client's medications; LCSW encouraged [name] to talk with Dr. Lottie Dawson Pharmacist about these medication questions  Encouraged [name] to talk with [facility] Triage Nurse as needed related to nursing needs of client   Patient Self Care Activities:  Feeds self with set up  Self Care Deficits Mobility deficits Wheelchair dependent    Initial goal documentation     Follow Up Plan: LCSW to call client or [name], grandmother in next 4 weeks to discuss client needs at that time  Materials Provided: No  The patient/ [name], grandmother of patient, verbalized understanding of instructions provided today and declined a print copy of patient instruction materials.   [name].Brookley Spitler MSW, LCSW Licensed Clinical Social Worker [facility] Care Management [phone]

## 2020-10-12 ENCOUNTER — Telehealth: Payer: Medicare Other

## 2020-10-13 DIAGNOSIS — C716 Malignant neoplasm of cerebellum: Secondary | ICD-10-CM | POA: Diagnosis not present

## 2020-10-13 DIAGNOSIS — E038 Other specified hypothyroidism: Secondary | ICD-10-CM | POA: Diagnosis not present

## 2020-10-13 DIAGNOSIS — A419 Sepsis, unspecified organism: Secondary | ICD-10-CM | POA: Diagnosis not present

## 2020-10-13 DIAGNOSIS — G40209 Localization-related (focal) (partial) symptomatic epilepsy and epileptic syndromes with complex partial seizures, not intractable, without status epilepticus: Secondary | ICD-10-CM | POA: Diagnosis not present

## 2020-10-13 DIAGNOSIS — J189 Pneumonia, unspecified organism: Secondary | ICD-10-CM | POA: Diagnosis not present

## 2020-10-13 DIAGNOSIS — L89152 Pressure ulcer of sacral region, stage 2: Secondary | ICD-10-CM | POA: Diagnosis not present

## 2020-10-14 ENCOUNTER — Other Ambulatory Visit: Payer: Self-pay

## 2020-10-14 ENCOUNTER — Other Ambulatory Visit: Payer: Medicare Other

## 2020-10-14 DIAGNOSIS — E039 Hypothyroidism, unspecified: Secondary | ICD-10-CM | POA: Diagnosis not present

## 2020-10-14 DIAGNOSIS — E291 Testicular hypofunction: Secondary | ICD-10-CM | POA: Diagnosis not present

## 2020-10-15 DIAGNOSIS — J189 Pneumonia, unspecified organism: Secondary | ICD-10-CM | POA: Diagnosis not present

## 2020-10-15 DIAGNOSIS — G40209 Localization-related (focal) (partial) symptomatic epilepsy and epileptic syndromes with complex partial seizures, not intractable, without status epilepticus: Secondary | ICD-10-CM | POA: Diagnosis not present

## 2020-10-15 DIAGNOSIS — C716 Malignant neoplasm of cerebellum: Secondary | ICD-10-CM | POA: Diagnosis not present

## 2020-10-15 DIAGNOSIS — A419 Sepsis, unspecified organism: Secondary | ICD-10-CM | POA: Diagnosis not present

## 2020-10-15 DIAGNOSIS — E038 Other specified hypothyroidism: Secondary | ICD-10-CM | POA: Diagnosis not present

## 2020-10-15 DIAGNOSIS — L89152 Pressure ulcer of sacral region, stage 2: Secondary | ICD-10-CM | POA: Diagnosis not present

## 2020-10-19 DIAGNOSIS — E038 Other specified hypothyroidism: Secondary | ICD-10-CM | POA: Diagnosis not present

## 2020-10-19 DIAGNOSIS — E2749 Other adrenocortical insufficiency: Secondary | ICD-10-CM | POA: Diagnosis not present

## 2020-10-19 DIAGNOSIS — E039 Hypothyroidism, unspecified: Secondary | ICD-10-CM | POA: Diagnosis not present

## 2020-10-19 DIAGNOSIS — E291 Testicular hypofunction: Secondary | ICD-10-CM | POA: Diagnosis not present

## 2020-10-20 ENCOUNTER — Encounter: Payer: Self-pay | Admitting: Family Medicine

## 2020-10-20 ENCOUNTER — Other Ambulatory Visit: Payer: Self-pay

## 2020-10-20 ENCOUNTER — Ambulatory Visit (INDEPENDENT_AMBULATORY_CARE_PROVIDER_SITE_OTHER): Payer: Medicare Other | Admitting: Family Medicine

## 2020-10-20 DIAGNOSIS — D649 Anemia, unspecified: Secondary | ICD-10-CM | POA: Diagnosis not present

## 2020-10-20 DIAGNOSIS — H9192 Unspecified hearing loss, left ear: Secondary | ICD-10-CM | POA: Diagnosis not present

## 2020-10-20 DIAGNOSIS — A419 Sepsis, unspecified organism: Secondary | ICD-10-CM | POA: Diagnosis not present

## 2020-10-20 DIAGNOSIS — F322 Major depressive disorder, single episode, severe without psychotic features: Secondary | ICD-10-CM

## 2020-10-20 DIAGNOSIS — L89152 Pressure ulcer of sacral region, stage 2: Secondary | ICD-10-CM | POA: Diagnosis not present

## 2020-10-20 DIAGNOSIS — N39498 Other specified urinary incontinence: Secondary | ICD-10-CM | POA: Diagnosis not present

## 2020-10-20 DIAGNOSIS — J189 Pneumonia, unspecified organism: Secondary | ICD-10-CM | POA: Diagnosis not present

## 2020-10-20 DIAGNOSIS — C716 Malignant neoplasm of cerebellum: Secondary | ICD-10-CM | POA: Diagnosis not present

## 2020-10-20 DIAGNOSIS — Z85841 Personal history of malignant neoplasm of brain: Secondary | ICD-10-CM | POA: Diagnosis not present

## 2020-10-20 DIAGNOSIS — Z87898 Personal history of other specified conditions: Secondary | ICD-10-CM | POA: Diagnosis not present

## 2020-10-20 DIAGNOSIS — E038 Other specified hypothyroidism: Secondary | ICD-10-CM | POA: Diagnosis not present

## 2020-10-20 DIAGNOSIS — G40209 Localization-related (focal) (partial) symptomatic epilepsy and epileptic syndromes with complex partial seizures, not intractable, without status epilepticus: Secondary | ICD-10-CM | POA: Diagnosis not present

## 2020-10-20 DIAGNOSIS — E2740 Unspecified adrenocortical insufficiency: Secondary | ICD-10-CM | POA: Diagnosis not present

## 2020-10-20 DIAGNOSIS — M1990 Unspecified osteoarthritis, unspecified site: Secondary | ICD-10-CM | POA: Diagnosis not present

## 2020-10-20 DIAGNOSIS — Z7982 Long term (current) use of aspirin: Secondary | ICD-10-CM | POA: Diagnosis not present

## 2020-10-20 DIAGNOSIS — N189 Chronic kidney disease, unspecified: Secondary | ICD-10-CM | POA: Diagnosis not present

## 2020-10-20 DIAGNOSIS — F17200 Nicotine dependence, unspecified, uncomplicated: Secondary | ICD-10-CM | POA: Diagnosis not present

## 2020-10-20 DIAGNOSIS — Z8673 Personal history of transient ischemic attack (TIA), and cerebral infarction without residual deficits: Secondary | ICD-10-CM | POA: Diagnosis not present

## 2020-10-20 MED ORDER — ESCITALOPRAM OXALATE 5 MG PO TABS: 5.0000 mg | ORAL_TABLET | Freq: Every day | ORAL | 3 refills | Status: DC

## 2020-10-20 NOTE — Telephone Encounter (Signed)
  Prescription Request  10/20/2020  What is the name of the medication or equipment? Vitamin B 100 mg, Testosterone 20.25 MG/ACT 1.62 gel, Ativan, Escitalopram 5 mg,  Have you contacted your pharmacy to request a refill? (if applicable) NO  [facility]   Patient notified that their request is being sent to the clinical staff for review and that they should receive a response within 2 business days.

## 2020-10-20 NOTE — Progress Notes (Signed)
Telephone visit  Subjective: CC: urology eval PCP: [name], DO IRW:[id number] James Hoffman is a 24 y.o. male calls for telephone consult today. Patient provides verbal consent for consult held via phone.  Due to COVID-19 pandemic this visit was conducted virtually. This visit type was conducted due to national recommendations for restrictions regarding the COVID-19 Pandemic (e.g. social distancing, sheltering in place) in an effort to limit this patient's exposure and mitigate transmission in our community. All issues noted in this document were discussed and addressed.  A physical exam was not performed with this format.   Location of patient: home Location of provider: [facility] Others present for call: [place]  1. Urology Patient has had urinary incontinence for the last few months.  He has 2 new brain lesions and she wonders if this is impacting.  He is requiring adult diapers at bedtime now.  This is new.  He occasionally has issues with urinating during the day.  He is more confused lately.  No hematuria, dysuria.   ROS: Per HPI  Allergies  Allergen Reactions  . Tramadol Anaphylaxis  . Morphine Nausea And Vomiting    According to mother the patient is intolerant of morphine; causes nausea and vomiting.  Has had none since he was 24yo.   Past Medical History:  Diagnosis Date  . Adrenal insufficiency ([place])   . Cancer ([place])    brain tumor on brain stem  . Hydrocephalus ([place])   . Osteoporosis   . Thyroid disease     Current Outpatient Medications:  .  thiamine 100 MG tablet, Take by mouth., Disp: , Rfl:  .  atorvastatin (LIPITOR) 40 MG tablet, , Disp: , Rfl:  .  Cholecalciferol 50 MCG (2000 UT) CAPS, Take by mouth., Disp: , Rfl:  .  clotrimazole-betamethasone (LOTRISONE) cream, Apply 1 application topically 2 (two) times daily. x7-10, Disp: 30 g, Rfl: 0 .  escitalopram (LEXAPRO) 5 MG tablet, Take 1 tablet (5 mg total) by mouth daily., Disp: 90 tablet, Rfl: 3 .   levETIRAcetam (KEPPRA) 750 MG tablet, Take 1,500 mg by mouth in the morning and at bedtime., Disp: , Rfl:  .  levothyroxine (SYNTHROID) 100 MCG tablet, Take 1 tablet (100 mcg total) by mouth daily before breakfast., Disp: 95 tablet, Rfl: 3 .  naproxen (NAPROSYN) 500 MG tablet, Take 1 tablet (500 mg total) by mouth 2 (two) times daily as needed for moderate pain., Disp: 60 tablet, Rfl: 3 .  predniSONE (DELTASONE) 5 MG tablet, Take 1.5 tablets (7.5 mg total) by mouth daily with breakfast., Disp: , Rfl:  .  silver sulfADIAZINE (SILVADENE) 1 % cream, Apply 1 application topically daily., Disp: 50 g, Rfl: 0 .  Testosterone 20.25 MG/ACT (1.62%) GEL, ONE PUM ON EACH SHOULDER EVERY MORNIG, Disp: 75 g, Rfl: 1  Assessment/ Plan: 24 y.o. male   Other urinary incontinence - Plan: Ambulatory referral to Urology  History of brain tumor - Plan: Ambulatory referral to Urology  Concerning that his brain lesions may be contributing.  Referral placed in the interim.    She will have records sent to me.  Start time: 11:07am End time: 11:25am  Total time spent on patient care (including telephone call/ virtual visit): 18 minutes  James Hoffman, [place] [phone]

## 2020-10-21 DIAGNOSIS — L89152 Pressure ulcer of sacral region, stage 2: Secondary | ICD-10-CM | POA: Diagnosis not present

## 2020-10-21 DIAGNOSIS — J189 Pneumonia, unspecified organism: Secondary | ICD-10-CM | POA: Diagnosis not present

## 2020-10-21 DIAGNOSIS — A419 Sepsis, unspecified organism: Secondary | ICD-10-CM | POA: Diagnosis not present

## 2020-10-21 DIAGNOSIS — G40209 Localization-related (focal) (partial) symptomatic epilepsy and epileptic syndromes with complex partial seizures, not intractable, without status epilepticus: Secondary | ICD-10-CM | POA: Diagnosis not present

## 2020-10-21 DIAGNOSIS — C716 Malignant neoplasm of cerebellum: Secondary | ICD-10-CM | POA: Diagnosis not present

## 2020-10-21 DIAGNOSIS — E038 Other specified hypothyroidism: Secondary | ICD-10-CM | POA: Diagnosis not present

## 2020-10-25 ENCOUNTER — Telehealth: Payer: Self-pay | Admitting: Family Medicine

## 2020-10-26 ENCOUNTER — Telehealth: Payer: Medicare Other | Admitting: *Deleted

## 2020-10-28 DIAGNOSIS — C716 Malignant neoplasm of cerebellum: Secondary | ICD-10-CM | POA: Diagnosis not present

## 2020-10-28 DIAGNOSIS — J189 Pneumonia, unspecified organism: Secondary | ICD-10-CM | POA: Diagnosis not present

## 2020-10-28 DIAGNOSIS — L89152 Pressure ulcer of sacral region, stage 2: Secondary | ICD-10-CM | POA: Diagnosis not present

## 2020-10-28 DIAGNOSIS — G40209 Localization-related (focal) (partial) symptomatic epilepsy and epileptic syndromes with complex partial seizures, not intractable, without status epilepticus: Secondary | ICD-10-CM | POA: Diagnosis not present

## 2020-10-28 DIAGNOSIS — E038 Other specified hypothyroidism: Secondary | ICD-10-CM | POA: Diagnosis not present

## 2020-10-28 DIAGNOSIS — A419 Sepsis, unspecified organism: Secondary | ICD-10-CM | POA: Diagnosis not present

## 2020-10-29 DIAGNOSIS — A419 Sepsis, unspecified organism: Secondary | ICD-10-CM | POA: Diagnosis not present

## 2020-10-29 DIAGNOSIS — L89152 Pressure ulcer of sacral region, stage 2: Secondary | ICD-10-CM | POA: Diagnosis not present

## 2020-10-29 DIAGNOSIS — E038 Other specified hypothyroidism: Secondary | ICD-10-CM | POA: Diagnosis not present

## 2020-10-29 DIAGNOSIS — G40209 Localization-related (focal) (partial) symptomatic epilepsy and epileptic syndromes with complex partial seizures, not intractable, without status epilepticus: Secondary | ICD-10-CM | POA: Diagnosis not present

## 2020-10-29 DIAGNOSIS — J189 Pneumonia, unspecified organism: Secondary | ICD-10-CM | POA: Diagnosis not present

## 2020-10-29 DIAGNOSIS — C716 Malignant neoplasm of cerebellum: Secondary | ICD-10-CM | POA: Diagnosis not present

## 2020-11-01 ENCOUNTER — Telehealth: Payer: Self-pay

## 2020-11-01 ENCOUNTER — Encounter: Payer: Self-pay | Admitting: *Deleted

## 2020-11-01 NOTE — Telephone Encounter (Signed)
Appointment scheduled.

## 2020-11-01 NOTE — Telephone Encounter (Signed)
Pts aunt called stating that pt was at [facility] from Nov 1st-8th and Dr Wynetta Emery prescribed him Ativan. Pt is out of his medicine and needs refills.  Ativan 0.05mg  4x daily   Also Vitamin B-1 100mg , 1x daily was prescribed to him that he needs refills on.   [facility]

## 2020-11-02 ENCOUNTER — Ambulatory Visit: Payer: Medicare Other | Admitting: Family Medicine

## 2020-11-03 DIAGNOSIS — E038 Other specified hypothyroidism: Secondary | ICD-10-CM | POA: Diagnosis not present

## 2020-11-03 DIAGNOSIS — J189 Pneumonia, unspecified organism: Secondary | ICD-10-CM | POA: Diagnosis not present

## 2020-11-03 DIAGNOSIS — L89152 Pressure ulcer of sacral region, stage 2: Secondary | ICD-10-CM | POA: Diagnosis not present

## 2020-11-03 DIAGNOSIS — A419 Sepsis, unspecified organism: Secondary | ICD-10-CM | POA: Diagnosis not present

## 2020-11-03 DIAGNOSIS — C716 Malignant neoplasm of cerebellum: Secondary | ICD-10-CM | POA: Diagnosis not present

## 2020-11-03 DIAGNOSIS — G40209 Localization-related (focal) (partial) symptomatic epilepsy and epileptic syndromes with complex partial seizures, not intractable, without status epilepticus: Secondary | ICD-10-CM | POA: Diagnosis not present

## 2020-11-04 DIAGNOSIS — E038 Other specified hypothyroidism: Secondary | ICD-10-CM | POA: Diagnosis not present

## 2020-11-04 DIAGNOSIS — A419 Sepsis, unspecified organism: Secondary | ICD-10-CM | POA: Diagnosis not present

## 2020-11-04 DIAGNOSIS — L89152 Pressure ulcer of sacral region, stage 2: Secondary | ICD-10-CM | POA: Diagnosis not present

## 2020-11-04 DIAGNOSIS — J189 Pneumonia, unspecified organism: Secondary | ICD-10-CM | POA: Diagnosis not present

## 2020-11-04 DIAGNOSIS — C716 Malignant neoplasm of cerebellum: Secondary | ICD-10-CM | POA: Diagnosis not present

## 2020-11-04 DIAGNOSIS — G40209 Localization-related (focal) (partial) symptomatic epilepsy and epileptic syndromes with complex partial seizures, not intractable, without status epilepticus: Secondary | ICD-10-CM | POA: Diagnosis not present

## 2020-11-08 ENCOUNTER — Other Ambulatory Visit: Payer: Self-pay

## 2020-11-08 ENCOUNTER — Ambulatory Visit (INDEPENDENT_AMBULATORY_CARE_PROVIDER_SITE_OTHER): Payer: Medicare Other

## 2020-11-08 DIAGNOSIS — Z8673 Personal history of transient ischemic attack (TIA), and cerebral infarction without residual deficits: Secondary | ICD-10-CM

## 2020-11-08 DIAGNOSIS — G40209 Localization-related (focal) (partial) symptomatic epilepsy and epileptic syndromes with complex partial seizures, not intractable, without status epilepticus: Secondary | ICD-10-CM | POA: Diagnosis not present

## 2020-11-08 DIAGNOSIS — A419 Sepsis, unspecified organism: Secondary | ICD-10-CM | POA: Diagnosis not present

## 2020-11-08 DIAGNOSIS — Z7982 Long term (current) use of aspirin: Secondary | ICD-10-CM

## 2020-11-08 DIAGNOSIS — C716 Malignant neoplasm of cerebellum: Secondary | ICD-10-CM

## 2020-11-08 DIAGNOSIS — J189 Pneumonia, unspecified organism: Secondary | ICD-10-CM | POA: Diagnosis not present

## 2020-11-08 DIAGNOSIS — N189 Chronic kidney disease, unspecified: Secondary | ICD-10-CM

## 2020-11-08 DIAGNOSIS — E2740 Unspecified adrenocortical insufficiency: Secondary | ICD-10-CM | POA: Diagnosis not present

## 2020-11-08 DIAGNOSIS — E038 Other specified hypothyroidism: Secondary | ICD-10-CM

## 2020-11-08 DIAGNOSIS — Z85841 Personal history of malignant neoplasm of brain: Secondary | ICD-10-CM

## 2020-11-08 DIAGNOSIS — H9192 Unspecified hearing loss, left ear: Secondary | ICD-10-CM

## 2020-11-08 DIAGNOSIS — F17200 Nicotine dependence, unspecified, uncomplicated: Secondary | ICD-10-CM | POA: Diagnosis not present

## 2020-11-08 DIAGNOSIS — M1990 Unspecified osteoarthritis, unspecified site: Secondary | ICD-10-CM

## 2020-11-08 DIAGNOSIS — L89152 Pressure ulcer of sacral region, stage 2: Secondary | ICD-10-CM | POA: Diagnosis not present

## 2020-11-09 DIAGNOSIS — J189 Pneumonia, unspecified organism: Secondary | ICD-10-CM | POA: Diagnosis not present

## 2020-11-09 DIAGNOSIS — C716 Malignant neoplasm of cerebellum: Secondary | ICD-10-CM | POA: Diagnosis not present

## 2020-11-09 DIAGNOSIS — E038 Other specified hypothyroidism: Secondary | ICD-10-CM | POA: Diagnosis not present

## 2020-11-09 DIAGNOSIS — L89152 Pressure ulcer of sacral region, stage 2: Secondary | ICD-10-CM | POA: Diagnosis not present

## 2020-11-09 DIAGNOSIS — A419 Sepsis, unspecified organism: Secondary | ICD-10-CM | POA: Diagnosis not present

## 2020-11-09 DIAGNOSIS — G40209 Localization-related (focal) (partial) symptomatic epilepsy and epileptic syndromes with complex partial seizures, not intractable, without status epilepticus: Secondary | ICD-10-CM | POA: Diagnosis not present

## 2020-11-16 DIAGNOSIS — C716 Malignant neoplasm of cerebellum: Secondary | ICD-10-CM | POA: Diagnosis not present

## 2020-11-16 DIAGNOSIS — E038 Other specified hypothyroidism: Secondary | ICD-10-CM | POA: Diagnosis not present

## 2020-11-16 DIAGNOSIS — L89152 Pressure ulcer of sacral region, stage 2: Secondary | ICD-10-CM | POA: Diagnosis not present

## 2020-11-16 DIAGNOSIS — A419 Sepsis, unspecified organism: Secondary | ICD-10-CM | POA: Diagnosis not present

## 2020-11-16 DIAGNOSIS — J189 Pneumonia, unspecified organism: Secondary | ICD-10-CM | POA: Diagnosis not present

## 2020-11-16 DIAGNOSIS — G40209 Localization-related (focal) (partial) symptomatic epilepsy and epileptic syndromes with complex partial seizures, not intractable, without status epilepticus: Secondary | ICD-10-CM | POA: Diagnosis not present

## 2020-11-17 DIAGNOSIS — R569 Unspecified convulsions: Secondary | ICD-10-CM | POA: Diagnosis not present

## 2020-11-17 DIAGNOSIS — H9193 Unspecified hearing loss, bilateral: Secondary | ICD-10-CM | POA: Diagnosis not present

## 2020-11-17 DIAGNOSIS — C716 Malignant neoplasm of cerebellum: Secondary | ICD-10-CM | POA: Diagnosis not present

## 2020-11-17 DIAGNOSIS — H269 Unspecified cataract: Secondary | ICD-10-CM | POA: Diagnosis not present

## 2020-11-17 DIAGNOSIS — F39 Unspecified mood [affective] disorder: Secondary | ICD-10-CM | POA: Diagnosis not present

## 2020-11-17 DIAGNOSIS — R413 Other amnesia: Secondary | ICD-10-CM | POA: Diagnosis not present

## 2020-11-18 ENCOUNTER — Ambulatory Visit: Payer: Medicare Other | Admitting: Licensed Clinical Social Worker

## 2020-11-18 DIAGNOSIS — M818 Other osteoporosis without current pathological fracture: Secondary | ICD-10-CM

## 2020-11-18 DIAGNOSIS — C716 Malignant neoplasm of cerebellum: Secondary | ICD-10-CM

## 2020-11-18 DIAGNOSIS — E890 Postprocedural hypothyroidism: Secondary | ICD-10-CM

## 2020-11-18 DIAGNOSIS — C801 Malignant (primary) neoplasm, unspecified: Secondary | ICD-10-CM

## 2020-11-18 DIAGNOSIS — Z87898 Personal history of other specified conditions: Secondary | ICD-10-CM

## 2020-11-18 NOTE — Chronic Care Management (AMB) (Signed)
  Chronic Care Management    Clinical Social Work Follow Up Note  11/18/2020 Name: James Hoffman MRN: [id number] DOB: [birth year]  James Hoffman is a 25 y.o. year old male who is a primary care patient of James Ip, DO. The CCM team was consulted for assistance with James Hoffman .   Review of patient status, including review of consultants reports, other relevant assessments, and collaboration with appropriate care team members and the patient's provider was performed as part of comprehensive patient evaluation and provision of chronic care management services.    SDOH (Social Determinants of Health) assessments performed: No; risk for depression;risk for social isolation; risk for tobacco use; risk for stress; risk for physical inactivity  Flowsheet Row Office Visit from 07/21/2020 in [facility]  PHQ-9 Total Score 1      GAD 7 : Generalized Anxiety Score 07/21/2020 05/20/2020 02/20/2020  Nervous, Anxious, on Edge 0 3 1  Control/stop worrying 0 3 1  Worry too much - different things 0 3 1  Trouble relaxing 0 3 1  Restless 0 3 0  Easily annoyed or irritable 1 3 0  Afraid - awful might happen 0 2 0  Total GAD 7 Score 1 20 4   Anxiety Difficulty Not difficult at all Very difficult Somewhat difficult    Outpatient Encounter Medications as of 11/18/2020  Medication Sig  . atorvastatin (LIPITOR) 40 MG tablet   . Cholecalciferol 50 MCG (2000 UT) CAPS Take by mouth.  . clotrimazole-betamethasone (LOTRISONE) cream Apply 1 application topically 2 (two) times daily. x7-10  . escitalopram (LEXAPRO) 5 MG tablet Take 1 tablet (5 mg total) by mouth daily.  01/16/2021 levETIRAcetam (KEPPRA) 750 MG tablet Take 1,500 mg by mouth in the morning and at bedtime.  Marland Kitchen levothyroxine (SYNTHROID) 100 MCG tablet Take 1 tablet (100 mcg total) by mouth daily before breakfast.  . naproxen (NAPROSYN) 500 MG tablet Take 1 tablet (500 mg total) by mouth 2 (two) times daily as needed for  moderate pain.  . predniSONE (DELTASONE) 5 MG tablet Take 1.5 tablets (7.5 mg total) by mouth daily with breakfast.  . silver sulfADIAZINE (SILVADENE) 1 % cream Apply 1 application topically daily.  . Testosterone 20.25 MG/ACT (1.62%) GEL ONE PUM ON EACH SHOULDER EVERY MORNIG  . thiamine 100 MG tablet Take by mouth.   No facility-administered encounter medications on file as of 11/18/2020.    LCSW called phone number of 01/16/2021, grandmother of client, today but LCSW was not able to speak via phone with James Hoffman today; phone message was not set up on phone and thus LCSW was not able to leave phone message for James Hoffman today  Follow Up Plan: LCSW to call client or James Hoffman, grandmother in next 4 weeks to discuss client needs at that time  James Hoffman MSW, LCSW Licensed Clinical Social Worker [facility] Care Management [phone]

## 2020-11-18 NOTE — Patient Instructions (Addendum)
Licensed Clinical Social Worker Visit Information  Materials Provided: No  11/18/2020  Name: James Hoffman     MRN: [id number]       DOB: 04/30/1996  James Hoffman is a 25 y.o. year old male who is a primary care patient of James Ip, DO. The CCM team was consulted for assistance with James Hoffman .   Review of patient status, including review of consultants reports, other relevant assessments, and collaboration with appropriate care team members and the patient's provider was performed as part of comprehensive patient evaluation and provision of chronic care management services.    SDOH (Social Determinants of Health) assessments performed: No; risk for depression;risk for social isolation; risk for tobacco use; risk for stress; risk for physical inactivity  LCSW called phone number of James Hoffman, grandmother of client, today but LCSW was not able to speak via phone with James Hoffman today; phone message was not set up on phone and thus LCSW was not able to leave phone message for James Hoffman today  Follow Up Plan:LCSW to call clientor James Hoffman, grandmotherin next 4 weeks to discuss client needs at that time  LCSW was not able to speak via phone today with client or with James Hoffman, grandmother  of client; thus, client or James Hoffman were not able to verbalize understanding of instructions provided today and were not able to accept or decline a print copy of patient instruction materials.   James Hoffman MSW, LCSW Licensed Clinical Social Worker [facility] [phone]

## 2020-11-22 ENCOUNTER — Ambulatory Visit (INDEPENDENT_AMBULATORY_CARE_PROVIDER_SITE_OTHER): Payer: Medicare Other | Admitting: Family Medicine

## 2020-11-22 ENCOUNTER — Encounter: Payer: Self-pay | Admitting: Family Medicine

## 2020-11-22 ENCOUNTER — Other Ambulatory Visit: Payer: Self-pay

## 2020-11-22 VITALS — BP 114/81 | HR 89 | Temp 97.4°F | Resp 20 | Ht 70.0 in | Wt 136.0 lb

## 2020-11-22 DIAGNOSIS — E890 Postprocedural hypothyroidism: Secondary | ICD-10-CM

## 2020-11-22 DIAGNOSIS — Z87898 Personal history of other specified conditions: Secondary | ICD-10-CM

## 2020-11-22 DIAGNOSIS — F322 Major depressive disorder, single episode, severe without psychotic features: Secondary | ICD-10-CM | POA: Diagnosis not present

## 2020-11-22 DIAGNOSIS — L2489 Irritant contact dermatitis due to other agents: Secondary | ICD-10-CM

## 2020-11-22 DIAGNOSIS — R29898 Other symptoms and signs involving the musculoskeletal system: Secondary | ICD-10-CM

## 2020-11-22 MED ORDER — ESCITALOPRAM OXALATE 10 MG PO TABS: 10.0000 mg | ORAL_TABLET | Freq: Every day | ORAL | 3 refills | Status: DC

## 2020-11-22 MED ORDER — CLOTRIMAZOLE-BETAMETHASONE 1-0.05 % EX CREA: 1.0000 "application " | TOPICAL_CREAM | Freq: Two times a day (BID) | CUTANEOUS | 0 refills | Status: DC

## 2020-11-22 NOTE — Patient Instructions (Signed)
Call Endocrinology for refills on Prednisone and Testosterone since they are technically managing those.

## 2020-11-22 NOTE — Progress Notes (Signed)
Subjective: CC: Follow-up chronic illnesses PCP: [name], DO CVU:DTHYHOO James Hoffman is a 25 y.o. male presenting to clinic today for:  1.  Seizure disorder Patient is accompanied today by his aunt, James Hoffman.  He last saw his hematologist, Dr. Clovis Riley, about a week ago.  MRI of the brain is planned prior to neuro follow-up.  Neuropsychology referral placed.  He has follow-up with neurology on the 19th of the month.  There is concern because there are lesions on the brain.  His family member voices quite a bit of concern that they are not getting the entire picture.  2. adrenal insufficiency Patient's testosterone was increased to twice daily.  His Synthroid was increased to 112 mcg daily.  Last OV with endocrinology was in December.  No repeat labs since that time.  Seems to be tolerating medication adjustments without difficulty.  3.  Mood disorder Patient with ongoing moodiness and short temperedness.  He is currently on Lexapro 5 mg daily.  She notes that the hospitalist had added some lorazepam up to 4 times daily but he has been out since December.  They apparently have a follow-up with this provider soon.  She is wondering whether or not this is something that should be continued.  He has not had breakthrough seizure activity since discontinuation of the Ativan.  He has follow-up with neurology as above.   ROS: Per HPI  Allergies  Allergen Reactions  . Tramadol Anaphylaxis  . Morphine Nausea And Vomiting    According to mother the patient is intolerant of morphine; causes nausea and vomiting.  Has had none since he was 25yo.   Past Medical History:  Diagnosis Date  . Adrenal insufficiency ([place])   . Cancer ([place])    brain tumor on brain stem  . Hydrocephalus ([place])   . Osteoporosis   . Thyroid disease     Current Outpatient Medications:  .  atorvastatin (LIPITOR) 40 MG tablet, , Disp: , Rfl:  .  Cholecalciferol 50 MCG (2000 UT) CAPS, Take by mouth., Disp: , Rfl:   .  escitalopram (LEXAPRO) 10 MG tablet, Take 1 tablet (10 mg total) by mouth daily., Disp: 90 tablet, Rfl: 3 .  levETIRAcetam (KEPPRA) 750 MG tablet, Take 1,500 mg by mouth in the morning and at bedtime., Disp: , Rfl:  .  levothyroxine (SYNTHROID) 112 MCG tablet, Take 1 tablet by mouth daily., Disp: , Rfl:  .  LORazepam (ATIVAN) 0.5 MG tablet, Take 0.5 mg by mouth every 6 (six) hours as needed for anxiety (take 1 up to 4 times a day)., Disp: , Rfl:  .  naproxen (NAPROSYN) 500 MG tablet, Take 1 tablet (500 mg total) by mouth 2 (two) times daily as needed for moderate pain., Disp: 60 tablet, Rfl: 3 .  predniSONE (DELTASONE) 5 MG tablet, Take 1.5 tablets (7.5 mg total) by mouth daily with breakfast., Disp: , Rfl:  .  Testosterone 20.25 MG/ACT (1.62%) GEL, ONE PUM ON EACH SHOULDER EVERY MORNIG, Disp: 75 g, Rfl: 1 .  thiamine 100 MG tablet, Take by mouth., Disp: , Rfl:  .  clotrimazole-betamethasone (LOTRISONE) cream, Apply 1 application topically 2 (two) times daily. x7-10, Disp: 30 g, Rfl: 0 Social History   Socioeconomic History  . Marital status: Single    Spouse name: Not on file  . Number of children: 0  . Years of education: 7th grade  . Highest education level: 7th grade  Occupational History  . Occupation: disabled  Tobacco Use  .  Smoking status: Current Every Day Smoker    Packs/day: 0.25    Years: 6.00    Pack years: 1.50    Types: Cigarettes    Start date: 11/25/2012  . Smokeless tobacco: Never Used  Vaping Use  . Vaping Use: Never used  Substance and Sexual Activity  . Alcohol use: No  . Drug use: No  . Sexual activity: Not Currently  Other Topics Concern  . Not on file  Social History Narrative  . Not on file   Social Determinants of Health   Financial Resource Strain: Low Risk   . Difficulty of Paying Living Expenses: Not very hard  Food Insecurity: No Food Insecurity  . Worried About Charity fundraiser in the Last Year: Never true  . Ran Out of Food in the  Last Year: Never true  Transportation Needs: No Transportation Needs  . Lack of Transportation (Medical): No  . Lack of Transportation (Non-Medical): No  Physical Activity: Inactive  . Days of Exercise per Week: 0 days  . Minutes of Exercise per Session: 0 min  Stress: Stress Concern Present  . Feeling of Stress : To some extent  Social Connections: Socially Isolated  . Frequency of Communication with Friends and Family: More than three times a week  . Frequency of Social Gatherings with Friends and Family: More than three times a week  . Attends Religious Services: Never  . Active Member of Clubs or Organizations: No  . Attends Archivist Meetings: Never  . Marital Status: Never married  Intimate Partner Violence: Not At Risk  . Fear of Current or Ex-Partner: No  . Emotionally Abused: No  . Physically Abused: No  . Sexually Abused: No   History reviewed. No pertinent family history.  Objective: Office vital signs reviewed. BP 114/81   Pulse 89   Temp (!) 97.4 F (36.3 C)   Resp 20   Ht 5\' 10"  (1.778 m)   Wt 136 lb (61.7 kg)   SpO2 94%   BMI 19.51 kg/m   Physical Examination:  General: Awake, alert, smells of tobacco, No acute distress Cardio: regular rate and rhythm, S1S2 heard, no murmurs appreciated Pulm: clear to auscultation bilaterally, no wheezes, rhonchi or rales; normal work of breathing on room air MSK: Arrives in wheelchair.  Muscle wasting noted Neuro: Interacts with provider. Psych: Mood is stable.  Patient is pleasant.  Depression screen [facility] 2/9 11/22/2020 07/21/2020 07/14/2020  Decreased Interest 1 0 0  Down, Depressed, Hopeless 2 0 3  PHQ - 2 Score 3 0 3  Altered sleeping 2 0 -  Tired, decreased energy 2 0 -  Change in appetite 2 1 -  Feeling bad or failure about yourself  1 0 -  Trouble concentrating 1 0 -  Moving slowly or fidgety/restless 2 0 -  Suicidal thoughts 0 0 -  PHQ-9 Score 13 1 -  Difficult doing work/chores Very difficult - -   Some recent data might be hidden   GAD 7 : Generalized Anxiety Score 11/22/2020 07/21/2020 05/20/2020 02/20/2020  Nervous, Anxious, on Edge 3 0 3 1  Control/stop worrying 3 0 3 1  Worry too much - different things 2 0 3 1  Trouble relaxing 2 0 3 1  Restless 1 0 3 0  Easily annoyed or irritable 2 1 3  0  Afraid - awful might happen 2 0 2 0  Total GAD 7 Score 15 1 20 4   Anxiety Difficulty Somewhat difficult Not difficult  at all Very difficult Somewhat difficult     Assessment/ Plan: 25 y.o. male   History of brain tumor  Postoperative hypothyroidism  Depression, major, single episode, severe (HCC)  Muscular deconditioning - Plan: Ambulatory referral to Physical Therapy  Irritant contact dermatitis due to other agents - Plan: clotrimazole-betamethasone (LOTRISONE) cream  I will see if I can reach out to his neurologist with regards to the benzodiazepine.  Not entirely sure that patient should be on a scheduled benzodiazepine.  I would like them to comment further on Keppra given what sounds like breakthrough seizures.  Unsure if the breakthrough seizures were secondary to underlying pneumonia versus uncontrolled seizure disorder.  If anything, I wonder if a longer acting benzodiazepine would be more appropriate.  We discussed the potential for physical dependence on benzodiazepine and that withdrawal from the medication could precipitate seizure disorder as well.  I certainly would like his neurologist to comment further on ongoing use of that medicine.  He has been out since December so I think he is outside of the window for withdrawal.  I have advanced his Lexapro to 10 mg daily since he seems to be having some breakthrough moodiness.  Apparently there is plans for a neuropsychiatrist at some point.  I renewed the medications he requested today.  Will defer testosterone and Synthroid refills to his endocrinologist.    Orders Placed This Encounter  Procedures  . Ambulatory referral to  Physical Therapy    Referral Priority:   Routine    Referral Type:   Physical Medicine    Referral Reason:   Specialty Services Required    Requested Specialty:   Physical Therapy    Number of Visits Requested:   1   Meds ordered this encounter  Medications  . escitalopram (LEXAPRO) 10 MG tablet    Sig: Take 1 tablet (10 mg total) by mouth daily.    Dispense:  90 tablet    Refill:  3  . clotrimazole-betamethasone (LOTRISONE) cream    Sig: Apply 1 application topically 2 (two) times daily. x7-10    Dispense:  30 g    Refill:  [place], DO [place] [phone]

## 2020-12-06 ENCOUNTER — Ambulatory Visit: Payer: Medicare Other | Admitting: Physical Therapy

## 2020-12-06 DIAGNOSIS — C716 Malignant neoplasm of cerebellum: Secondary | ICD-10-CM | POA: Diagnosis not present

## 2020-12-06 DIAGNOSIS — Z8673 Personal history of transient ischemic attack (TIA), and cerebral infarction without residual deficits: Secondary | ICD-10-CM | POA: Diagnosis not present

## 2020-12-07 ENCOUNTER — Ambulatory Visit: Payer: Medicare Other | Attending: Family Medicine | Admitting: Physical Therapy

## 2020-12-07 ENCOUNTER — Encounter: Payer: Self-pay | Admitting: Physical Therapy

## 2020-12-07 ENCOUNTER — Other Ambulatory Visit: Payer: Self-pay

## 2020-12-07 DIAGNOSIS — R2681 Unsteadiness on feet: Secondary | ICD-10-CM | POA: Diagnosis not present

## 2020-12-07 DIAGNOSIS — M6281 Muscle weakness (generalized): Secondary | ICD-10-CM | POA: Insufficient documentation

## 2020-12-07 NOTE — Therapy (Signed)
[facility] [place], [place], [address] Phone: [phone]   Fax:  [phone]  Physical Therapy Evaluation  Patient Details  Name: James Hoffman MRN: [id number] Date of Birth: 09/29/1996 Referring Provider (PT): Ronnie Doss, DO   Encounter Date: 12/07/2020   PT End of Session - 12/07/20 1711    Visit Number 1    Number of Visits 8    Date for PT Re-Evaluation 01/11/21    PT Start Time 1300    PT Stop Time 1344    PT Time Calculation (min) 44 min    Equipment Utilized During Treatment Gait belt;Other (comment)   W/C   Activity Tolerance Patient tolerated treatment well    Behavior During Therapy WFL for tasks assessed/performed           Past Medical History:  Diagnosis Date  . Adrenal insufficiency ([place])   . 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 Hydrocephalus ([place])   . Osteoporosis   . Thyroid disease     Past Surgical History:  Procedure Laterality Date  . brain tumor removal  August 2012  . EYE SURGERY Right 2014   cataract removed  . HIP SURGERY    . teeth removal      There were no vitals filed for this visit.    Subjective Assessment - 12/07/20 1401    Subjective COVID-19 screening performed upon arrival. Patient arrives to physical therapy with his cousin with limited mobility, difficulty walking, decreased strength and endurance secondary to physical decontioning due to hospitalizations and history of seizures. Patient reports having skilled home health physical therapy after hospitalization but has not been performing exercises without the help of his cousin. Patient is dependent on family members for ADLs and gait. Patient unable to walk independently and grandmother unable to assist therefore scoots around home. Patient reports more left sided LE pain. Patient's goals are to improve movement and strength.    Pertinent History Osteoporosis, Cancer, Thyroid Disease, Adrenal  insufficiency, Gait instability    Limitations Standing;Walking;House hold activities    How long can you walk comfortably? requires assistance from family member    Patient Stated Goals improve movement              [facility] PT Assessment - 12/07/20 0001      Assessment   Medical Diagnosis Muscular deconditioning    Referring Provider (PT) Ronnie Doss, DO    Onset Date/Surgical Date --   ongoing   Hand Dominance Right    Next MD Visit "3 to 4 weeks"    Prior Therapy yes      Precautions   Precautions Fall    Precaution Comments Gait belt      Restrictions   Weight Bearing Restrictions No      Balance Screen   Has the patient fallen in the past 6 months No   near falls   Has the patient had a decrease in activity level because of a fear of falling?  No    Is the patient reluctant to leave their home because of a fear of falling?  No      Home Environment   Living Environment Private residence    Living Arrangements Other relatives   Grandmother   Available Help at Discharge Family    Type of [place] home      Prior Function   Level of Independence Needs assistance with transfers;Needs assistance with gait;Needs assistance with  homemaking;Needs assistance with ADLs      Observation/Other Assessments   Observations ankle braces      ROM / Strength   AROM / PROM / Strength Strength      Strength   Strength Assessment Site Hip;Knee;Shoulder    Right/Left Shoulder Right;Left    Right Shoulder Flexion 3+/5    Right Shoulder ABduction 3+/5    Right Shoulder Internal Rotation 4-/5    Right Shoulder External Rotation 4-/5    Left Shoulder Flexion 3/5    Left Shoulder ABduction 3/5    Left Shoulder Internal Rotation 3+/5    Left Shoulder External Rotation 3+/5    Right/Left Hip Right;Left    Right Hip Flexion 3+/5    Right Hip Extension 3-/5    Right Hip ABduction 3-/5    Left Hip Flexion 3+/5    Left Hip Extension 3-/5    Left Hip ABduction 3-/5     Right/Left Knee Left;Right    Right Knee Flexion 3+/5    Right Knee Extension 3+/5    Left Knee Flexion 3+/5    Left Knee Extension 3+/5      Palpation   Palpation comment significant muscle atrophy profusely in bilateral LEs      Transfers   Five time sit to stand comments  40 seconds with contact guard and UE support    Comments requires min/mod A for stand pivot transfers from cousin      Ambulation/Gait   Ambulation/Gait Yes    Ambulation/Gait Assistance 4: Min assist   provided by cousin   Estate manager/land agent    Gait Pattern Step-to pattern;Decreased stride length;Right foot flat;Left foot flat;Trunk flexed;Narrow base of support    Ambulation Surface Level;Indoor                      Objective measurements completed on examination: See above findings.                    PT Long Term Goals - 12/07/20 1713      PT LONG TERM GOAL #1   Title Patient will be modified independent with HEP and its progression with assistance from cousin.    Baseline No knowledge of appropriate ther ex.    Time 4    Period Weeks    Status New      PT LONG TERM GOAL #2   Title Patient will demonstrate 4-/5  bilateral UE and LE MMT to improve stability during functional tasks.    Baseline Bilateral LE and UE strength grades from 3- to 4-/5.    Time 4    Period Weeks    Status New      PT LONG TERM GOAL #3   Title Patient will perform sit to stands x5 with UE support and close supervision to improve transfers.    Baseline Min A    Time 4    Period Weeks    Status New                  Plan - 12/07/20 1557    Clinical Impression Statement Patient is a 25 year old male who presents to physical therapy with decreased UE and LE MMT, difficulty walking, and difficulty transfering secondary to physical and muscular deconditioning after a hospitalization in November 2021. Patient's modified 5x sit to stand time of 40 seconds categorizes him as a  fall risk with decreased LE strength. Patient required contact guard  throughout test for safety. Patient's cousin assisted with ambulation and patient demonstrated varying step to and step through ataxic gait pattern and with narrow base of support. Patient and PT discussed plan of care and discussed HEP to which patient reported understanding.    Personal Factors and Comorbidities Age;Comorbidity 3+    Comorbidities Osteoporosis, Cancer, Thyroid Disease, Adrenal insufficiency, gait instability    Examination-Activity Limitations Bed Mobility;Locomotion Level;Transfers;Stand;Hygiene/Grooming;Dressing    Stability/Clinical Decision Making Stable/Uncomplicated    Clinical Decision Making Low    Rehab Potential Poor    PT Frequency 2x / week    PT Duration 4 weeks    PT Treatment/Interventions ADLs/Self Care Home Management;Gait training;Stair training;Functional mobility training;Therapeutic activities;Therapeutic exercise;Balance training;Neuromuscular re-education;Manual techniques;Wheelchair mobility training;Passive range of motion;Patient/family education    PT Next Visit Plan nustep, LE strengthening, gait training, transfer training, sit to stands progress as tolerated    PT Home Exercise Plan see patient education section    Consulted and Agree with Plan of Care Patient           Patient will benefit from skilled therapeutic intervention in order to improve the following deficits and impairments:  Abnormal gait,Difficulty walking,Decreased range of motion,Pain,Decreased activity tolerance,Decreased balance,Decreased strength,Postural dysfunction  Visit Diagnosis: Unsteadiness on feet - Plan: PT plan of care cert/re-cert  Muscle weakness (generalized) - Plan: PT plan of care cert/re-cert     Problem List Patient Active Problem List   Diagnosis Date Noted  . Abnormal chest x-ray 06/24/2020  . Decreased lung sounds 06/24/2020  . Wound check, abscess 05/20/2020  . Depression,  major, single episode, severe ([place]) 05/20/2020  . 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 Blindness and low vision 10/29/2019  . History of eye surgery 10/29/2019  . History of brain tumor 10/29/2019  . Gait instability 12/19/2016  . Hypogonadism, male 07/19/2016  . Normocytic anemia 04/14/2016  . Muscular deconditioning 09/17/2015  . Hypothyroidism 09/09/2015  . Medullary carcinoma ([place]) 09/09/2015  . Adrenal insufficiency ([place]) 09/09/2015  . Hypotension 09/09/2015  . Hyponatremia 09/09/2015  . Hypokalemia 09/09/2015    Gabriela Eves, PT, DPT 12/07/2020, 5:21 PM  [facility] [address] [place], [place], [address] Phone: [phone]   Fax:  [phone]  Name: JAQUARIUS SEDER MRN: [id number] Date of Birth: 06-23-1996

## 2020-12-08 DIAGNOSIS — Z79899 Other long term (current) drug therapy: Secondary | ICD-10-CM | POA: Diagnosis not present

## 2020-12-08 DIAGNOSIS — G40209 Localization-related (focal) (partial) symptomatic epilepsy and epileptic syndromes with complex partial seizures, not intractable, without status epilepticus: Secondary | ICD-10-CM | POA: Diagnosis not present

## 2020-12-08 DIAGNOSIS — C716 Malignant neoplasm of cerebellum: Secondary | ICD-10-CM | POA: Diagnosis not present

## 2020-12-08 DIAGNOSIS — R4182 Altered mental status, unspecified: Secondary | ICD-10-CM | POA: Diagnosis not present

## 2020-12-08 DIAGNOSIS — G40219 Localization-related (focal) (partial) symptomatic epilepsy and epileptic syndromes with complex partial seizures, intractable, without status epilepticus: Secondary | ICD-10-CM | POA: Diagnosis not present

## 2020-12-08 DIAGNOSIS — G9340 Encephalopathy, unspecified: Secondary | ICD-10-CM | POA: Diagnosis not present

## 2020-12-08 DIAGNOSIS — Z993 Dependence on wheelchair: Secondary | ICD-10-CM | POA: Diagnosis not present

## 2020-12-10 ENCOUNTER — Encounter: Payer: Self-pay | Admitting: Physical Therapy

## 2020-12-10 ENCOUNTER — Ambulatory Visit: Payer: Medicare Other | Admitting: Physical Therapy

## 2020-12-10 ENCOUNTER — Other Ambulatory Visit: Payer: Self-pay

## 2020-12-10 DIAGNOSIS — R2681 Unsteadiness on feet: Secondary | ICD-10-CM | POA: Diagnosis not present

## 2020-12-10 DIAGNOSIS — M6281 Muscle weakness (generalized): Secondary | ICD-10-CM

## 2020-12-10 NOTE — Therapy (Signed)
[facility] [place], [place], [address] Phone: [phone]   Fax:  [phone]  Physical Therapy Treatment  Patient Details  Name: James Hoffman MRN: [id number] Date of Birth: 1996/09/05 Referring Provider (PT): Ronnie Doss, DO   Encounter Date: 12/10/2020   PT End of Session - 12/10/20 1216    Visit Number 2    Number of Visits 8    Date for PT Re-Evaluation 01/11/21    PT Start Time 1030    PT Stop Time 1113    PT Time Calculation (min) 43 min    Equipment Utilized During Treatment Gait belt;Other (comment)    Activity Tolerance Patient tolerated treatment well    Behavior During Therapy [facility] for tasks assessed/performed           Past Medical History:  Diagnosis Date  . Adrenal insufficiency ([place])   . 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 Hydrocephalus ([place])   . Osteoporosis   . Thyroid disease     Past Surgical History:  Procedure Laterality Date  . brain tumor removal  August 2012  . EYE SURGERY Right 2014   cataract removed  . HIP SURGERY    . teeth removal      There were no vitals filed for this visit.   Subjective Assessment - 12/10/20 1039    Subjective COVID-19 screening performed upon arrival. Patient arrives with left sided LE pain.    Pertinent History Osteoporosis, Cancer, Thyroid Disease, Adrenal insufficiency, Gait instability    Limitations Standing;Walking;House hold activities    How long can you walk comfortably? requires assistance from family member    Patient Stated Goals improve movement    Currently in Pain? Yes   did not provide number on pain scale   Pain Location Leg    Pain Orientation Left              [facility] PT Assessment - 12/10/20 0001      Assessment   Medical Diagnosis Muscular deconditioning    Referring Provider (PT) Ronnie Doss, DO    Hand Dominance Right    Next MD Visit "3 to 4 weeks"    Prior Therapy yes      Precautions    Precautions Fall    Precaution Comments Gait belt      Restrictions   Weight Bearing Restrictions No                         [facility] Adult PT Treatment/Exercise - 12/10/20 0001      Exercises   Exercises Knee/Hip      Knee/Hip Exercises: Aerobic   Nustep level 1 x10 mins; frequent rest breaks and verbal cuing to decrease SPM.      Knee/Hip Exercises: Seated   Long Arc Quad AROM;Both;2 sets;20 reps   x15 left LE   Marching AROM;Both;2 sets;10 reps    Hamstring Curl Strengthening;Both;2 sets;10 reps    Hamstring Limitations red theraband      Knee/Hip Exercises: Supine   Bridges Both;2 sets;10 reps    Other Supine Knee/Hip Exercises clam shells 2x10 red theraband                       PT Long Term Goals - 12/07/20 1713      PT LONG TERM GOAL #1   Title Patient will be modified independent with HEP and its progression with assistance  from cousin.    Baseline No knowledge of appropriate ther ex.    Time 4    Period Weeks    Status New      PT LONG TERM GOAL #2   Title Patient will demonstrate 4-/5  bilateral UE and LE MMT to improve stability during functional tasks.    Baseline Bilateral LE and UE strength grades from 3- to 4-/5.    Time 4    Period Weeks    Status New      PT LONG TERM GOAL #3   Title Patient will perform sit to stands x5 with UE support and close supervision to improve transfers.    Baseline Min A    Time 4    Period Weeks    Status New                 Plan - 12/10/20 1059    Clinical Impression Statement Patient arrives to physical therapy with left LE pain. Patient  was able to transfer with contact guard from [facility] to nustep. Patient provided with constant cuing for pacing as patient was performing nustep at 140 SPM. Patient guided through TEs in sitting and supine with constant verbal and tactile cuing for form. Patient noted with more LE weakness compared to right. Constant contact guard for transfers secondary to  impulsivity.    Personal Factors and Comorbidities Age;Comorbidity 3+    Comorbidities Osteoporosis, Cancer, Thyroid Disease, Adrenal insufficiency, gait instability    Examination-Activity Limitations Bed Mobility;Locomotion Level;Transfers;Stand;Hygiene/Grooming;Dressing    Stability/Clinical Decision Making Stable/Uncomplicated    Clinical Decision Making Low    Rehab Potential Poor    PT Frequency 2x / week    PT Duration 4 weeks    PT Treatment/Interventions ADLs/Self Care Home Management;Gait training;Stair training;Functional mobility training;Therapeutic activities;Therapeutic exercise;Balance training;Neuromuscular re-education;Manual techniques;Wheelchair mobility training;Passive range of motion;Patient/family education    PT Next Visit Plan nustep, LE strengthening, gait training, transfer training, sit to stands progress as tolerated    PT Home Exercise Plan see patient education section    Consulted and Agree with Plan of Care Patient           Patient will benefit from skilled therapeutic intervention in order to improve the following deficits and impairments:  Abnormal gait,Difficulty walking,Decreased range of motion,Pain,Decreased activity tolerance,Decreased balance,Decreased strength,Postural dysfunction  Visit Diagnosis: Unsteadiness on feet  Muscle weakness (generalized)     Problem List Patient Active Problem List   Diagnosis Date Noted  . Abnormal chest x-ray 06/24/2020  . Decreased lung sounds 06/24/2020  . Wound check, abscess 05/20/2020  . Depression, major, single episode, severe ([place]) 05/20/2020  . 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 Blindness and low vision 10/29/2019  . History of eye surgery 10/29/2019  . History of brain tumor 10/29/2019  . Gait instability 12/19/2016  . Hypogonadism, male 07/19/2016  . Normocytic anemia 04/14/2016  . Muscular deconditioning 09/17/2015  . Hypothyroidism 09/09/2015  . Medullary carcinoma ([place]) 09/09/2015  .  Adrenal insufficiency ([place]) 09/09/2015  . Hypotension 09/09/2015  . Hyponatremia 09/09/2015  . Hypokalemia 09/09/2015    Gabriela Eves, PT, DPT 12/10/2020, 12:18 PM  [facility] [address] [place], [place], [address] Phone: [phone]   Fax:  [phone]  Name: James Hoffman MRN: [id number] Date of Birth: 05-May-1996

## 2020-12-13 ENCOUNTER — Ambulatory Visit: Payer: Medicare Other | Admitting: Physical Therapy

## 2020-12-13 ENCOUNTER — Encounter: Payer: Self-pay | Admitting: Physical Therapy

## 2020-12-13 ENCOUNTER — Other Ambulatory Visit: Payer: Self-pay

## 2020-12-13 DIAGNOSIS — R2681 Unsteadiness on feet: Secondary | ICD-10-CM | POA: Diagnosis not present

## 2020-12-13 DIAGNOSIS — M6281 Muscle weakness (generalized): Secondary | ICD-10-CM | POA: Diagnosis not present

## 2020-12-13 NOTE — Therapy (Signed)
[facility] [place], [place], [address] Phone: [phone]   Fax:  [phone]  Physical Therapy Treatment  Patient Details  Name: James Hoffman MRN: [id number] Date of Birth: [birth year] Referring Provider (PT): Ronnie Doss, DO   Encounter Date: 12/13/2020   PT End of Session - 12/13/20 1128    Visit Number 3    Number of Visits 8    Date for PT Re-Evaluation 01/11/21    PT Start Time 1133    PT Stop Time 1200   limited by late arrival   PT Time Calculation (min) 27 min    Equipment Utilized During Treatment Other (comment)   FWW/ WC   Activity Tolerance Patient tolerated treatment well    Behavior During Therapy [facility] for tasks assessed/performed           Past Medical History:  Diagnosis Date  . Adrenal insufficiency ([place])   . Cancer ([place])    brain tumor on brain stem  . Hydrocephalus ([place])   . Osteoporosis   . Thyroid disease     Past Surgical History:  Procedure Laterality Date  . brain tumor removal  August 2012  . EYE SURGERY Right 2014   cataract removed  . HIP SURGERY    . teeth removal      There were no vitals filed for this visit.   Subjective Assessment - 12/13/20 1128    Subjective COVID-19 screening performed upon arrival. Patient arrives with pain but unable to provide details. Caregiver, [name], states that he has exercises and will inquire with his grandmother whether they are being completed at home.    Patient is accompained by: Family member   Caregiver, Courtney   Pertinent History Osteoporosis, Cancer, Thyroid Disease, Adrenal insufficiency, Gait instability    Limitations Standing;Walking;House hold activities    How long can you walk comfortably? requires assistance from family member    Patient Stated Goals improve movement    Currently in Pain? Yes   Unable to provide full pain assessemnt             [facility] PT Assessment - 12/13/20 0001      Assessment    Medical Diagnosis Muscular deconditioning    Referring Provider (PT) Ronnie Doss, DO    Hand Dominance Right    Next MD Visit "3 to 4 weeks"    Prior Therapy yes      Precautions   Precautions Fall    Precaution Comments Gait belt      Restrictions   Weight Bearing Restrictions No                         [facility] Adult PT Treatment/Exercise - 12/13/20 0001      Knee/Hip Exercises: Aerobic   Nustep level 1 x10 mins; frequent stops due to pain      Knee/Hip Exercises: Seated   Long Arc Quad AROM;Both;2 sets;10 reps    [facility] x15 reps    Clamshell with TheraBand Yellow   x15 reps; tactile blocking at feet   Marching AROM;Both;15 reps    Hamstring Curl Strengthening;Both;2 sets;10 reps    Hamstring Limitations yellow theraband                       PT Long Term Goals - 12/07/20 1713      PT LONG TERM GOAL #1   Title Patient will be modified independent with HEP and  its progression with assistance from cousin.    Baseline No knowledge of appropriate ther ex.    Time 4    Period Weeks    Status New      PT LONG TERM GOAL #2   Title Patient will demonstrate 4-/5  bilateral UE and LE MMT to improve stability during functional tasks.    Baseline Bilateral LE and UE strength grades from 3- to 4-/5.    Time 4    Period Weeks    Status New      PT LONG TERM GOAL #3   Title Patient will perform sit to stands x5 with UE support and close supervision to improve transfers.    Baseline Min A    Time 4    Period Weeks    Status New                 Plan - 12/13/20 1205    Clinical Impression Statement Patient presented in clinic with frequent stops due to pain in which he would touch his L knee and R hip. Patient had difficulty in short sitting due to back pain and L shoulder pain. Patient able to tolerate light resistance and light reps. Patient requires mod assist for transfers from sit > stand. VCs required for transfers as well due to  patient's impulsivity. Patient requires mod-max mutlimodal cueing to promote proper technique of exercises especially demo due to [facility].    Personal Factors and Comorbidities Age;Comorbidity 3+    Comorbidities Osteoporosis, Cancer, Thyroid Disease, Adrenal insufficiency, gait instability    Examination-Activity Limitations Bed Mobility;Locomotion Level;Transfers;Stand;Hygiene/Grooming;Dressing    Stability/Clinical Decision Making Stable/Uncomplicated    Rehab Potential Poor    PT Frequency 2x / week    PT Duration 4 weeks    PT Treatment/Interventions ADLs/Self Care Home Management;Gait training;Stair training;Functional mobility training;Therapeutic activities;Therapeutic exercise;Balance training;Neuromuscular re-education;Manual techniques;Wheelchair mobility training;Passive range of motion;Patient/family education    PT Next Visit Plan nustep, LE strengthening, gait training, transfer training, sit to stands progress as tolerated    PT Home Exercise Plan see patient education section    Consulted and Agree with Plan of Care Patient           Patient will benefit from skilled therapeutic intervention in order to improve the following deficits and impairments:  Abnormal gait,Difficulty walking,Decreased range of motion,Pain,Decreased activity tolerance,Decreased balance,Decreased strength,Postural dysfunction  Visit Diagnosis: Unsteadiness on feet  Muscle weakness (generalized)     Problem List Patient Active Problem List   Diagnosis Date Noted  . Abnormal chest x-ray 06/24/2020  . Decreased lung sounds 06/24/2020  . Wound check, abscess 05/20/2020  . Depression, major, single episode, severe ([place]) 05/20/2020  . 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 Blindness and low vision 10/29/2019  . History of eye surgery 10/29/2019  . History of brain tumor 10/29/2019  . Gait instability 12/19/2016  . Hypogonadism, male 07/19/2016  . Normocytic anemia 04/14/2016  . Muscular  deconditioning 09/17/2015  . Hypothyroidism 09/09/2015  . 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 Adrenal insufficiency ([place]) 09/09/2015  . Hypotension 09/09/2015  . Hyponatremia 09/09/2015  . Hypokalemia 09/09/2015    Standley Brooking, PTA 12/13/2020, 12:08 PM  [facility] [address] [place], [place], [address] Phone: [phone]   Fax:  [phone]  Name: James Hoffman MRN: [id number] Date of Birth: [birth year]

## 2020-12-14 DIAGNOSIS — H25812 Combined forms of age-related cataract, left eye: Secondary | ICD-10-CM | POA: Diagnosis not present

## 2020-12-14 DIAGNOSIS — H5462 Unqualified visual loss, left eye, normal vision right eye: Secondary | ICD-10-CM | POA: Diagnosis not present

## 2020-12-14 DIAGNOSIS — Z961 Presence of intraocular lens: Secondary | ICD-10-CM | POA: Diagnosis not present

## 2020-12-14 DIAGNOSIS — H5461 Unqualified visual loss, right eye, normal vision left eye: Secondary | ICD-10-CM | POA: Diagnosis not present

## 2020-12-14 DIAGNOSIS — H547 Unspecified visual loss: Secondary | ICD-10-CM | POA: Diagnosis not present

## 2020-12-16 ENCOUNTER — Encounter: Payer: Self-pay | Admitting: Family

## 2020-12-16 ENCOUNTER — Ambulatory Visit (INDEPENDENT_AMBULATORY_CARE_PROVIDER_SITE_OTHER): Payer: Medicare Other | Admitting: Family

## 2020-12-16 ENCOUNTER — Encounter: Payer: Medicare Other | Admitting: Physical Therapy

## 2020-12-16 DIAGNOSIS — J069 Acute upper respiratory infection, unspecified: Secondary | ICD-10-CM | POA: Diagnosis not present

## 2020-12-16 MED ORDER — FLUTICASONE PROPIONATE 50 MCG/ACT NA SUSP: 2.0000 | Freq: Every day | NASAL | 6 refills | Status: DC

## 2020-12-16 MED ORDER — BENZONATATE 200 MG PO CAPS: 200.0000 mg | ORAL_CAPSULE | Freq: Three times a day (TID) | ORAL | 1 refills | Status: DC | PRN

## 2020-12-16 NOTE — Progress Notes (Signed)
   Virtual Visit via telephone Note Due to COVID-19 pandemic this visit was conducted virtually. This visit type was conducted due to national recommendations for restrictions regarding the COVID-19 Pandemic (e.g. social distancing, sheltering in place) in an effort to limit this patient's exposure and mitigate transmission in our community. All issues noted in this document were discussed and addressed.  A physical exam was not performed with this format.  I connected with James Hoffman on 12/16/20 at 1:10 pm  by telephone and verified that I am speaking with the correct person using two identifiers. James Hoffman is currently located at home and grandmother is currently with him  during visit. The provider, Evelina Dun, FNP is located in their office at time of visit.  I discussed the limitations, risks, security and privacy concerns of performing an evaluation and management service by telephone and the availability of in person appointments. I also discussed with the patient that there may be a patient responsible charge related to this service. The patient expressed understanding and agreed to proceed.   History and Present Illness:  Cough This is a new problem. The current episode started in the past 7 days. The problem has been gradually worsening. The problem occurs every few minutes. The cough is non-productive. Associated symptoms include headaches, myalgias and shortness of breath. Pertinent negatives include no chills, ear congestion, ear pain, fever or weight loss. He has tried rest for the symptoms. The treatment provided mild relief.      Review of Systems  Constitutional: Negative for chills, fever and weight loss.  HENT: Negative for ear pain.   Respiratory: Positive for cough and shortness of breath.   Musculoskeletal: Positive for myalgias.  Neurological: Positive for headaches.     Observations/Objective: No SOB or distress noted   Assessment and Plan: 1.  Viral URI - Take meds as prescribed - Use a cool mist humidifier  -Use saline nose sprays frequently -Force fluids -For any cough or congestion  Use plain Mucinex- regular strength or max strength is fine -For fever or aces or pains- take tylenol or ibuprofen. -Throat lozenges if help -Call if symptoms worsen or do not improve  - Novel Coronavirus, NAA ([facility]) - fluticasone (FLONASE) 50 MCG/ACT nasal spray; Place 2 sprays into both nostrils daily.  Dispense: 16 g; Refill: 6 - benzonatate (TESSALON) 200 MG capsule; Take 1 capsule (200 mg total) by mouth 3 (three) times daily as needed.  Dispense: 30 capsule; Refill: 1     I discussed the assessment and treatment plan with the patient. The patient was provided an opportunity to ask questions and all were answered. The patient agreed with the plan and demonstrated an understanding of the instructions.   The patient was advised to call back or seek an in-person evaluation if the symptoms worsen or if the condition fails to improve as anticipated.  The above assessment and management plan was discussed with the patient. The patient verbalized understanding of and has agreed to the management plan. Patient is aware to call the clinic if symptoms persist or worsen. Patient is aware when to return to the clinic for a follow-up visit. Patient educated on when it is appropriate to go to the emergency department.   Time call ended:  1:21 pm   I provided 11 minutes of non-face-to-face time during this encounter.    Evelina Dun, FNP

## 2020-12-17 LAB — SARS-COV-2, NAA 2 DAY TAT

## 2020-12-17 LAB — NOVEL CORONAVIRUS, NAA: SARS-CoV-2, NAA: NOT DETECTED

## 2020-12-21 ENCOUNTER — Encounter: Payer: Self-pay | Admitting: Physical Therapy

## 2020-12-21 ENCOUNTER — Other Ambulatory Visit: Payer: Self-pay

## 2020-12-21 ENCOUNTER — Ambulatory Visit: Payer: Medicare Other | Attending: Family Medicine | Admitting: Physical Therapy

## 2020-12-21 ENCOUNTER — Telehealth: Payer: Medicare Other

## 2020-12-21 DIAGNOSIS — M6281 Muscle weakness (generalized): Secondary | ICD-10-CM | POA: Diagnosis not present

## 2020-12-21 DIAGNOSIS — R2681 Unsteadiness on feet: Secondary | ICD-10-CM | POA: Insufficient documentation

## 2020-12-21 NOTE — Therapy (Addendum)
[facility] [place], [place], [address] Phone: [phone]   Fax:  [phone]  Physical Therapy Treatment PHYSICAL THERAPY DISCHARGE SUMMARY  Visits from Start of Care: 4  Current functional level related to goals / functional outcomes: See below   Remaining deficits: See goals   Education / Equipment: HEP Plan: Patient agrees to discharge.  Patient goals were not met. Patient is being discharged due to not returning since the last visit.  ?????     Patient Details  Name: James Hoffman MRN: [id number] Date of Birth: 17-Sep-1996 Referring Provider (PT): Ronnie Doss, DO   Encounter Date: 12/21/2020   PT End of Session - 12/21/20 1311    Visit Number 4    Number of Visits 8    Date for PT Re-Evaluation 01/11/21    PT Start Time 1093    PT Stop Time 1336    PT Time Calculation (min) 33 min    Equipment Utilized During Treatment Other (comment)   WC   Activity Tolerance Patient limited by fatigue    Behavior During Therapy [facility] for tasks assessed/performed           Past Medical History:  Diagnosis Date  . Adrenal insufficiency ([place])   . Cancer ([place])    brain tumor on brain stem  . Hydrocephalus ([place])   . Osteoporosis   . Thyroid disease     Past Surgical History:  Procedure Laterality Date  . brain tumor removal  August 2012  . EYE SURGERY Right 2014   cataract removed  . HIP SURGERY    . teeth removal      There were no vitals filed for this visit.   Subjective Assessment - 12/21/20 1310    Subjective COVID-19 screening performed upon arrival. Reports pain.    Patient is accompained by: Family member   Courtney   Pertinent History Osteoporosis, Cancer, Thyroid Disease, Adrenal insufficiency, Gait instability    Limitations Standing;Walking;House hold activities    How long can you walk comfortably? requires assistance from family member    Patient Stated Goals improve movement     Currently in Pain? Yes   generalized pain. Unable to provide pain score             [facility] PT Assessment - 12/21/20 0001      Assessment   Medical Diagnosis Muscular deconditioning    Referring Provider (PT) Ronnie Doss, DO    Hand Dominance Right    Next MD Visit "3 to 4 weeks"    Prior Therapy yes      Precautions   Precautions Fall    Precaution Comments Gait belt      Restrictions   Weight Bearing Restrictions No                         [facility] Adult PT Treatment/Exercise - 12/21/20 0001      Transfers   Transfers Stand Pivot Transfers    Stand Pivot Transfers 5: Supervision    Stand Pivot Transfer Details (indicate cue type and reason) from chair to [facility]      Knee/Hip Exercises: Aerobic   Nustep level 1 x11 mins; frequent stops due to pain      Knee/Hip Exercises: Seated   Long Arc Quad AROM;Both;2 sets;10 reps    [facility] x15 reps    Clamshell with TheraBand Yellow   x30 reps   Other Seated Knee/Hip Exercises B heel/toe  raises x20 reps esach    Hamstring Curl Strengthening;Both;2 sets;10 reps    Hamstring Limitations yellow theraband                       PT Long Term Goals - 12/07/20 1713      PT LONG TERM GOAL #1   Title Patient will be modified independent with HEP and its progression with assistance from cousin.    Baseline No knowledge of appropriate ther ex.    Time 4    Period Weeks    Status New      PT LONG TERM GOAL #2   Title Patient will demonstrate 4-/5  bilateral UE and LE MMT to improve stability during functional tasks.    Baseline Bilateral LE and UE strength grades from 3- to 4-/5.    Time 4    Period Weeks    Status New      PT LONG TERM GOAL #3   Title Patient will perform sit to stands x5 with UE support and close supervision to improve transfers.    Baseline Min A    Time 4    Period Weeks    Status New                 Plan - 12/21/20 1342    Clinical Impression Statement Patient  presented in clinic with reports of increased generalized pain especially L knee today. Patient requires frequent multimodal cueing for form, redirection, technique. Patient able to complete all exercises in short sitting in a chair today for back support. Greatest complaint during therex was of L knee pain.    Personal Factors and Comorbidities Age;Comorbidity 3+    Comorbidities Osteoporosis, Cancer, Thyroid Disease, Adrenal insufficiency, gait instability    Examination-Activity Limitations Bed Mobility;Locomotion Level;Transfers;Stand;Hygiene/Grooming;Dressing    Stability/Clinical Decision Making Stable/Uncomplicated    Rehab Potential Poor    PT Frequency 2x / week    PT Duration 4 weeks    PT Treatment/Interventions ADLs/Self Care Home Management;Gait training;Stair training;Functional mobility training;Therapeutic activities;Therapeutic exercise;Balance training;Neuromuscular re-education;Manual techniques;Wheelchair mobility training;Passive range of motion;Patient/family education    PT Next Visit Plan nustep, LE strengthening, gait training, transfer training, sit to stands progress as tolerated    PT Home Exercise Plan see patient education section    Consulted and Agree with Plan of Care Patient           Patient will benefit from skilled therapeutic intervention in order to improve the following deficits and impairments:  Abnormal gait,Difficulty walking,Decreased range of motion,Pain,Decreased activity tolerance,Decreased balance,Decreased strength,Postural dysfunction  Visit Diagnosis: Unsteadiness on feet  Muscle weakness (generalized)     Problem List Patient Active Problem List   Diagnosis Date Noted  . Abnormal chest x-ray 06/24/2020  . Decreased lung sounds 06/24/2020  . Wound check, abscess 05/20/2020  . Depression, major, single episode, severe ([place]) 05/20/2020  . 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 Blindness and low vision 10/29/2019  . History of eye  surgery 10/29/2019  . History of brain tumor 10/29/2019  . Gait instability 12/19/2016  . Hypogonadism, male 07/19/2016  . Normocytic anemia 04/14/2016  . Muscular deconditioning 09/17/2015  . Hypothyroidism 09/09/2015  . Medullary carcinoma ([place]) 09/09/2015  . Adrenal insufficiency ([place]) 09/09/2015  . Hypotension 09/09/2015  . Hyponatremia 09/09/2015  . Hypokalemia 09/09/2015    Standley Brooking, PTA 12/21/2020, 1:44 PM  [facility] [address] [place], [place], [address] Phone: [phone]   Fax:  [phone]  Name: James Hoffman MRN: [id number] Date of Birth: 29-Nov-1995

## 2020-12-24 ENCOUNTER — Ambulatory Visit: Payer: Medicare Other | Admitting: Physical Therapy

## 2020-12-27 ENCOUNTER — Ambulatory Visit (INDEPENDENT_AMBULATORY_CARE_PROVIDER_SITE_OTHER): Payer: Medicare Other | Admitting: Family

## 2020-12-27 ENCOUNTER — Encounter: Payer: Self-pay | Admitting: Family

## 2020-12-27 DIAGNOSIS — B9689 Other specified bacterial agents as the cause of diseases classified elsewhere: Secondary | ICD-10-CM

## 2020-12-27 DIAGNOSIS — J208 Acute bronchitis due to other specified organisms: Secondary | ICD-10-CM | POA: Diagnosis not present

## 2020-12-27 MED ORDER — PREDNISONE 10 MG (21) PO TBPK: ORAL_TABLET | ORAL | 0 refills | Status: DC

## 2020-12-27 MED ORDER — DOXYCYCLINE HYCLATE 100 MG PO TABS: 100.0000 mg | ORAL_TABLET | Freq: Two times a day (BID) | ORAL | 0 refills | Status: DC

## 2020-12-27 NOTE — Progress Notes (Signed)
Virtual Visit via telephone Note Due to COVID-19 pandemic this visit was conducted virtually. This visit type was conducted due to national recommendations for restrictions regarding the COVID-19 Pandemic (e.g. social distancing, sheltering in place) in an effort to limit this patient's exposure and mitigate transmission in our community. All issues noted in this document were discussed and addressed.  A physical exam was not performed with this format.  I connected with James Hoffman's aunt on 12/27/20 at 12:28 pm  by telephone and verified that I am speaking with the correct person using two identifiers. James Hoffman is currently located at  and home  is currently with aunt during visit. The provider, Evelina Dun, FNP is located in their office at time of visit.  I discussed the limitations, risks, security and privacy concerns of performing an evaluation and management service by telephone and the availability of in person appointments. I also discussed with the patient that there may be a patient responsible charge related to this service. The patient expressed understanding and agreed to proceed.   History and Present Illness:  Calls with worsening cough. He had a telephone visit on 12/16/20 and diagnosed with Viral URI. He was given tessalon and flonase. He had a negative COVID at that time. Cough This is a new problem. The current episode started 1 to 4 weeks ago. The problem has been gradually worsening. The problem occurs every few minutes. The cough is productive of sputum. Associated symptoms include headaches, myalgias, nasal congestion and shortness of breath. Pertinent negatives include no chills, ear congestion, ear pain or fever.      Review of Systems  Constitutional: Negative for chills and fever.  HENT: Negative for ear pain.   Respiratory: Positive for cough and shortness of breath.   Musculoskeletal: Positive for myalgias.  Neurological: Positive for  headaches.     Observations/Objective: No SOB or distress noted , aunt did most of the talking  Assessment and Plan: 1. Acute bacterial bronchitis - Take meds as prescribed - Use a cool mist humidifier  -Use saline nose sprays frequently -Force fluids -For any cough or congestion  Use plain Mucinex- regular strength or max strength is fine -For fever or aces or pains- take tylenol or ibuprofen. -RTO if symptoms if worsen or do not improve  - doxycycline (VIBRA-TABS) 100 MG tablet; Take 1 tablet (100 mg total) by mouth 2 (two) times daily.  Dispense: 20 tablet; Refill: 0 - predniSONE (STERAPRED UNI-PAK 21 TAB) 10 MG (21) TBPK tablet; Use as directed  Dispense: 21 tablet; Refill: 0       I discussed the assessment and treatment plan with the patient. The patient was provided an opportunity to ask questions and all were answered. The patient agreed with the plan and demonstrated an understanding of the instructions.   The patient was advised to call back or seek an in-person evaluation if the symptoms worsen or if the condition fails to improve as anticipated.  The above assessment and management plan was discussed with the patient. The patient verbalized understanding of and has agreed to the management plan. Patient is aware to call the clinic if symptoms persist or worsen. Patient is aware when to return to the clinic for a follow-up visit. Patient educated on when it is appropriate to go to the emergency department.   Time call ended:  12:39 pm   I provided 11 minutes of non-face-to-face time during this encounter.    Evelina Dun, FNP

## 2021-01-05 ENCOUNTER — Ambulatory Visit (INDEPENDENT_AMBULATORY_CARE_PROVIDER_SITE_OTHER): Payer: Medicare Other

## 2021-01-05 ENCOUNTER — Ambulatory Visit (INDEPENDENT_AMBULATORY_CARE_PROVIDER_SITE_OTHER): Payer: Medicare Other | Admitting: Family Medicine

## 2021-01-05 ENCOUNTER — Encounter: Payer: Self-pay | Admitting: Family Medicine

## 2021-01-05 VITALS — BP 91/64 | HR 111 | Temp 97.3°F

## 2021-01-05 DIAGNOSIS — J9801 Acute bronchospasm: Secondary | ICD-10-CM

## 2021-01-05 DIAGNOSIS — G40909 Epilepsy, unspecified, not intractable, without status epilepticus: Secondary | ICD-10-CM | POA: Diagnosis not present

## 2021-01-05 DIAGNOSIS — R059 Cough, unspecified: Secondary | ICD-10-CM | POA: Diagnosis not present

## 2021-01-05 DIAGNOSIS — Z87898 Personal history of other specified conditions: Secondary | ICD-10-CM | POA: Diagnosis not present

## 2021-01-05 DIAGNOSIS — E890 Postprocedural hypothyroidism: Secondary | ICD-10-CM

## 2021-01-05 DIAGNOSIS — E871 Hypo-osmolality and hyponatremia: Secondary | ICD-10-CM

## 2021-01-05 DIAGNOSIS — R062 Wheezing: Secondary | ICD-10-CM | POA: Diagnosis not present

## 2021-01-05 DIAGNOSIS — Z7409 Other reduced mobility: Secondary | ICD-10-CM

## 2021-01-05 DIAGNOSIS — D72829 Elevated white blood cell count, unspecified: Secondary | ICD-10-CM | POA: Diagnosis not present

## 2021-01-05 DIAGNOSIS — F419 Anxiety disorder, unspecified: Secondary | ICD-10-CM | POA: Diagnosis not present

## 2021-01-05 MED ORDER — AEROCHAMBER PLUS MISC: 2 refills | Status: DC

## 2021-01-05 MED ORDER — ALBUTEROL SULFATE HFA 108 (90 BASE) MCG/ACT IN AERS: 2.0000 | INHALATION_SPRAY | Freq: Four times a day (QID) | RESPIRATORY_TRACT | 0 refills | Status: DC | PRN

## 2021-01-05 MED ORDER — BUSPIRONE HCL 5 MG PO TABS: 5.0000 mg | ORAL_TABLET | Freq: Three times a day (TID) | ORAL | 2 refills | Status: DC

## 2021-01-05 NOTE — Progress Notes (Signed)
Subjective: CC: Follow-up depression, cough PCP: [name], DO FYB:[id number] D Garrelts is a 25 y.o. male presenting to clinic today for:  1.  Depression Patient is brought to the office by his aunt, [name].  She notes that he seems to be in the best spirits that he has been in a while.  He continues to take his Lexapro 10 mg daily.  She does note that he continues to have quite a bit of anxiety wants to know what the neurologist said about medications for this.  He continues to have some moodiness and anxiety attack and she does think that he would benefit from additional medication for that  2.  Cough Patient has been seen for cough.  He has a history of pneumonia which apparently led to sepsis previously.  She is inquiring on his pneumococcal vaccination.  He is status post treatment with doxycycline and Tessalon Perles.  No hemoptysis or fevers.  No reports of shortness of breath or wheezing.  He is an active every day smoker  3.  Cataracts Patient is scheduled for cataract surgery in March.  4.  History of brain tumor status post resection Patient is scheduled to have a lumbar puncture on Friday.  He is continued on Keppra, Synthroid   ROS: Per HPI  Allergies  Allergen Reactions  . Tramadol Anaphylaxis  . Morphine Nausea And Vomiting    According to mother the patient is intolerant of morphine; causes nausea and vomiting.  Has had none since he was 25yo.   Past Medical History:  Diagnosis Date  . Adrenal insufficiency ([place])   . Cancer ([place])    brain tumor on brain stem  . Hydrocephalus ([place])   . Osteoporosis   . Thyroid disease     Current Outpatient Medications:  .  atorvastatin (LIPITOR) 40 MG tablet, , Disp: , Rfl:  .  benzonatate (TESSALON) 200 MG capsule, Take 1 capsule (200 mg total) by mouth 3 (three) times daily as needed., Disp: 30 capsule, Rfl: 1 .  Cholecalciferol 50 MCG (2000 UT) CAPS, Take by mouth., Disp: , Rfl:  .  clotrimazole-betamethasone  (LOTRISONE) cream, Apply 1 application topically 2 (two) times daily. x7-10, Disp: 30 g, Rfl: 0 .  doxycycline (VIBRA-TABS) 100 MG tablet, Take 1 tablet (100 mg total) by mouth 2 (two) times daily., Disp: 20 tablet, Rfl: 0 .  escitalopram (LEXAPRO) 10 MG tablet, Take 1 tablet (10 mg total) by mouth daily., Disp: 90 tablet, Rfl: 3 .  fluticasone (FLONASE) 50 MCG/ACT nasal spray, Place 2 sprays into both nostrils daily., Disp: 16 g, Rfl: 6 .  levETIRAcetam (KEPPRA) 750 MG tablet, Take 1,500 mg by mouth in the morning and at bedtime., Disp: , Rfl:  .  levothyroxine (SYNTHROID) 112 MCG tablet, Take 1 tablet by mouth daily., Disp: , Rfl:  .  LORazepam (ATIVAN) 0.5 MG tablet, Take 0.5 mg by mouth every 6 (six) hours as needed for anxiety (take 1 up to 4 times a day)., Disp: , Rfl:  .  naproxen (NAPROSYN) 500 MG tablet, Take 1 tablet (500 mg total) by mouth 2 (two) times daily as needed for moderate pain., Disp: 60 tablet, Rfl: 3 .  predniSONE (STERAPRED UNI-PAK 21 TAB) 10 MG (21) TBPK tablet, Use as directed, Disp: 21 tablet, Rfl: 0 .  Testosterone 20.25 MG/ACT (1.62%) GEL, ONE PUM ON EACH SHOULDER EVERY MORNIG, Disp: 75 g, Rfl: 1 .  thiamine 100 MG tablet, Take by mouth., Disp: , Rfl:  Social  History   Socioeconomic History  . Marital status: Single    Spouse name: Not on file  . Number of children: 0  . Years of education: 7th grade  . Highest education level: 7th grade  Occupational History  . Occupation: disabled  Tobacco Use  . Smoking status: Current Every Day Smoker    Packs/day: 0.25    Years: 6.00    Pack years: 1.50    Types: Cigarettes    Start date: 11/25/2012  . Smokeless tobacco: Never Used  Vaping Use  . Vaping Use: Never used  Substance and Sexual Activity  . Alcohol use: No  . Drug use: No  . Sexual activity: Not Currently  Other Topics Concern  . Not on file  Social History Narrative  . Not on file   Social Determinants of Health   Financial Resource Strain: Low  Risk   . Difficulty of Paying Living Expenses: Not very hard  Food Insecurity: No Food Insecurity  . Worried About Charity fundraiser in the Last Year: Never true  . Ran Out of Food in the Last Year: Never true  Transportation Needs: No Transportation Needs  . Lack of Transportation (Medical): No  . Lack of Transportation (Non-Medical): No  Physical Activity: Inactive  . Days of Exercise per Week: 0 days  . Minutes of Exercise per Session: 0 min  Stress: Stress Concern Present  . Feeling of Stress : To some extent  Social Connections: Socially Isolated  . Frequency of Communication with Friends and Family: More than three times a week  . Frequency of Social Gatherings with Friends and Family: More than three times a week  . Attends Religious Services: Never  . Active Member of Clubs or Organizations: No  . Attends Archivist Meetings: Never  . Marital Status: Never married  Intimate Partner Violence: Not At Risk  . Fear of Current or Ex-Partner: No  . Emotionally Abused: No  . Physically Abused: No  . Sexually Abused: No   No family history on file.  Objective: Office vital signs reviewed. BP 91/64   Pulse (!) 111   Temp (!) 97.3 F (36.3 C) (Temporal)   SpO2 98%   Physical Examination:  General: Awake, alert, No acute distress HEENT: Normal; no exophthalmos.  No goiter Cardio: regular rate and rhythm, S1S2 heard, no murmurs appreciated Pulm: Mild global expiratory wheezes, rhonchi or rales; normal work of breathing on room air MSK: arrives in wheelchair, poor muscle tone. Moves extremities Psych: Repetitive.  Polite.  Interactive.  Assessment/ Plan: 25 y.o. male   Postoperative hypothyroidism - Plan: T4, Free  Leukocytosis, unspecified type - Plan: CBC with Differential  Hyponatremia - Plan: CMP14+EGFR  Cough in adult - Plan: DG Chest 2 View, Ambulatory referral to : albuterol (VENTOLIN HFA) 108 (90 Base) MCG/ACT inhaler,  Spacer/Aero-Holding Chambers (AEROCHAMBER PLUS) inhaler  Bronchospasm - Plan: albuterol (VENTOLIN HFA) 108 (90 Base) MCG/ACT inhaler, Spacer/Aero-Holding Chambers (AEROCHAMBER PLUS) inhaler  Anxiety - Plan: busPIRone (BUSPAR) 5 MG tablet, Ambulatory referral to [place]  Impaired mobility and endurance - Plan: Ambulatory referral to Home Health  History of brain tumor - Plan: Ambulatory referral to Home Health  Seizure disorder [facility]) - Plan: Ambulatory referral to [place] T4.  TSH not reliable given history of brain surgery.  Will CC results to endocrinology  Check CBC in CCed to Dr. Florene Glen at [facility] medical  Patient with hyponatremia.  Possibly  induced by SSRI plus or minus antiseizure medication.  Will need to be extremely careful with this.  I did not advance his Lexapro for this reason and instead sent in BuSpar which does not appear to have any effect on sodium for anxiety.  I discussed with the patient and his caregiver that benzodiazepines were not recommended by his neurologist.  Given ongoing cough and history of pneumococcal infection we will look with chest x-ray.  His lung exam was remarkable for expiratory wheezes today.  Ventolin with spacer sent.  I would recommend pneumococcal vaccination in this patient but would like his cough to improve prior to administration  Referral to home health physical therapy, Occupational Therapy and speech therapy placed.  I would like him evaluated and treated for physical deconditioning, immobility and imbalance.  He is quite dependent on others at this point for assistance with dressing, ADLs and IADLs.  There is concerned about change in mentation specifically memory loss as he has become quite repetitive over the last several months.  Would like to make sure that he is safely swallowing as well  No orders of the defined types were placed in this encounter.  Meds ordered this encounter  Medications  . busPIRone  (BUSPAR) 5 MG tablet    Sig: Take 1 tablet (5 mg total) by mouth 3 (three) times daily. For anxiety    Dispense:  90 tablet    Refill:  2  . albuterol (VENTOLIN HFA) 108 (90 Base) MCG/ACT inhaler    Sig: Inhale 2 puffs into the lungs every 6 (six) hours as needed for wheezing or shortness of breath.    Dispense:  8 g    Refill:  0  . Spacer/Aero-Holding Chambers (AEROCHAMBER PLUS) inhaler    Sig: Use as instructed    Dispense:  1 each    Refill:  [place], [place] [phone]

## 2021-01-05 NOTE — Patient Instructions (Signed)

## 2021-01-06 ENCOUNTER — Telehealth: Payer: Self-pay

## 2021-01-06 LAB — CMP14+EGFR
ALT: 30 IU/L (ref 0–44)
AST: 23 IU/L (ref 0–40)
Albumin/Globulin Ratio: 2.4 — ABNORMAL HIGH (ref 1.2–2.2)
Albumin: 3.8 g/dL — ABNORMAL LOW (ref 4.1–5.2)
Alkaline Phosphatase: 84 IU/L (ref 44–121)
BUN/Creatinine Ratio: 5 — ABNORMAL LOW (ref 9–20)
BUN: 4 mg/dL — ABNORMAL LOW (ref 6–20)
Bilirubin Total: 0.2 mg/dL (ref 0.0–1.2)
CO2: 25 mmol/L (ref 20–29)
Calcium: 9.1 mg/dL (ref 8.7–10.2)
Chloride: 88 mmol/L — ABNORMAL LOW (ref 96–106)
Creatinine, Ser: 0.74 mg/dL — ABNORMAL LOW (ref 0.76–1.27)
GFR calc Af Amer: 149 mL/min/{1.73_m2} (ref >59)
GFR calc non Af Amer: 129 mL/min/{1.73_m2} (ref >59)
Globulin, Total: 1.6 g/dL (ref 1.5–4.5)
Glucose: 96 mg/dL (ref 65–99)
Potassium: 3.9 mmol/L (ref 3.5–5.2)
Sodium: 130 mmol/L — ABNORMAL LOW (ref 134–144)
Total Protein: 5.4 g/dL — ABNORMAL LOW (ref 6.0–8.5)

## 2021-01-06 LAB — CBC WITH DIFFERENTIAL/PLATELET
Basophils Absolute: 0.1 10*3/uL (ref 0.0–0.2)
Basos: 0 %
EOS (ABSOLUTE): 0.7 10*3/uL — ABNORMAL HIGH (ref 0.0–0.4)
Eos: 3 %
Hematocrit: 39.6 % (ref 37.5–51.0)
Hemoglobin: 14 g/dL (ref 13.0–17.7)
Immature Grans (Abs): 0.3 10*3/uL — ABNORMAL HIGH (ref 0.0–0.1)
Immature Granulocytes: 1 %
Lymphocytes Absolute: 3.3 10*3/uL — ABNORMAL HIGH (ref 0.7–3.1)
Lymphs: 14 %
MCH: 32.6 pg (ref 26.6–33.0)
MCHC: 35.4 g/dL (ref 31.5–35.7)
MCV: 92 fL (ref 79–97)
Monocytes Absolute: 1.8 10*3/uL — ABNORMAL HIGH (ref 0.1–0.9)
Monocytes: 7 %
Neutrophils Absolute: 18 10*3/uL — ABNORMAL HIGH (ref 1.4–7.0)
Neutrophils: 75 %
Platelets: 377 10*3/uL (ref 150–450)
RBC: 4.3 x10E6/uL (ref 4.14–5.80)
RDW: 12.4 % (ref 11.6–15.4)
WBC: 24.1 10*3/uL (ref 3.4–10.8)

## 2021-01-06 LAB — T4, FREE: Free T4: 1.96 ng/dL — ABNORMAL HIGH (ref 0.82–1.77)

## 2021-01-06 NOTE — Telephone Encounter (Signed)
Pts niece/caregiver called stating that she really needs Dr Lajuana Ripple or Dr Dettinger (covering provider) to review xrays that pt had done yesterday because he has a lumbar testing scheduled for tomorrow and needs to know what chest xray results are before they go in for appt.

## 2021-01-06 NOTE — Telephone Encounter (Signed)
Patient seen Dr. Darnell Level yesterday. Please review and advice Covering PCP

## 2021-01-06 NOTE — Telephone Encounter (Signed)
Looks like the x-ray shows a couple of rib fractures but no signs of pneumonia or infection.  The x-ray also showed some shoulder issues but nothing that should keep her from going to lumbar testing. the rib fracture should heal on their own

## 2021-01-07 ENCOUNTER — Telehealth: Payer: Self-pay

## 2021-01-07 ENCOUNTER — Other Ambulatory Visit: Payer: Self-pay | Admitting: Family Medicine

## 2021-01-07 DIAGNOSIS — R936 Abnormal findings on diagnostic imaging of limbs: Secondary | ICD-10-CM

## 2021-01-07 DIAGNOSIS — C716 Malignant neoplasm of cerebellum: Secondary | ICD-10-CM | POA: Diagnosis not present

## 2021-01-07 NOTE — Telephone Encounter (Signed)
Left message to call back  

## 2021-01-07 NOTE — Telephone Encounter (Signed)
Aunt aware

## 2021-01-07 NOTE — Telephone Encounter (Signed)
Patient's aunt [name] called with further questions regarding the rib fractures and shoulder issue noted on the CXR from 01/05/21.  Explained that rib fracture are healing - she asked when and how patient could have gotten them - explained that there is no way to be sure as she states that the patient has not had any known injury - they were not noted on the 06/24/20 CXR. Explained to patient that per the CXR and prev shoulder xray done on 10/16/19 the left shoulder shows am lesion that needs further MRI evaluation.   Can you please place order for MRI of the left shoulder - patient using imaging center at [facility] (across the street from the hospital).

## 2021-01-07 NOTE — Telephone Encounter (Signed)
James Hoffman - patient's aunt wanted to know if patient had a pneumonia vaccine - per chart and [place] imunnization site patient has not had one.  Please advise if due to medical history if patient should have one and if so should he wait until after cough has resloved.

## 2021-01-07 NOTE — Telephone Encounter (Signed)
Refer to previous phone encounter 

## 2021-01-07 NOTE — Telephone Encounter (Signed)
MRI ordered.  Would be glad to refer to ortho.  Let me know who/ where

## 2021-01-07 NOTE — Telephone Encounter (Signed)
Given smoking status and other comorbidities, I do recommend PNA vaccine.  However, would wait until cough has resolved.

## 2021-01-08 ENCOUNTER — Other Ambulatory Visit: Payer: Self-pay | Admitting: Family Medicine

## 2021-01-11 NOTE — Addendum Note (Signed)
Addended by: [name] on: 01/11/2021 05:39 PM   Modules accepted: Orders

## 2021-01-12 ENCOUNTER — Telehealth: Payer: Self-pay | Admitting: Family Medicine

## 2021-01-20 ENCOUNTER — Other Ambulatory Visit: Payer: Self-pay

## 2021-01-20 ENCOUNTER — Ambulatory Visit (INDEPENDENT_AMBULATORY_CARE_PROVIDER_SITE_OTHER): Payer: Medicare Other | Admitting: Family Medicine

## 2021-01-20 ENCOUNTER — Encounter: Payer: Self-pay | Admitting: Family Medicine

## 2021-01-20 ENCOUNTER — Ambulatory Visit (INDEPENDENT_AMBULATORY_CARE_PROVIDER_SITE_OTHER): Payer: Medicare Other

## 2021-01-20 VITALS — BP 108/66 | Temp 97.0°F | Ht 70.0 in | Wt 141.6 lb

## 2021-01-20 DIAGNOSIS — M25511 Pain in right shoulder: Secondary | ICD-10-CM | POA: Diagnosis not present

## 2021-01-20 DIAGNOSIS — W19XXXA Unspecified fall, initial encounter: Secondary | ICD-10-CM

## 2021-01-20 DIAGNOSIS — M545 Low back pain, unspecified: Secondary | ICD-10-CM

## 2021-01-20 DIAGNOSIS — S40011A Contusion of right shoulder, initial encounter: Secondary | ICD-10-CM

## 2021-01-20 DIAGNOSIS — R079 Chest pain, unspecified: Secondary | ICD-10-CM | POA: Diagnosis not present

## 2021-01-20 DIAGNOSIS — R0789 Other chest pain: Secondary | ICD-10-CM | POA: Diagnosis not present

## 2021-01-20 DIAGNOSIS — F1523 Other stimulant dependence with withdrawal: Secondary | ICD-10-CM

## 2021-01-20 DIAGNOSIS — R000 Tachycardia, unspecified: Secondary | ICD-10-CM

## 2021-01-20 DIAGNOSIS — F1593 Other stimulant use, unspecified with withdrawal: Secondary | ICD-10-CM

## 2021-01-20 NOTE — Progress Notes (Signed)
Subjective:  Patient ID: James Hoffman, male    DOB: September 21, 1996  Age: 25 y.o. MRN: [id number]  CC: Back Pain   HPI James Hoffman is a near-total care patient who is brought in by his aunt today.  History is given by her.  He tends to have a lot of falls because he is nonambulatory.  He tries to make his own transfers from wheelchair to bed and she thinks that he fell in the process and will hit his right shoulder and back on the bedside table.  He has bruising at the lower back at the ball of the right shoulder.  Because of this he is in for x-rays of those areas.  He is unable to express clearly his pain.  He has only 15% residual hearing.  He is unable to communicate effectively.  He is unable to cooperate with exam.  No review of systems can be obtained.  He has not been able to communicate when this bruising/accident occurred.  His heart rate was high on presentation.  Questioning regarding caffeine intake of shows that he previously drank 3 or 4 [place] a day.  However, that is been cut back significantly and so far today he has only had about 2 sips of [facility].  He does not drink other forms of caffeine including coffee.  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  He now has deficiency of many of his hormones including testosterone thyroid and adrenal.  He is considered deaf.  His musculature is atrophic due to inactivity.  His vision is near blindness according to the record and history from his aunt.  Depression screen [facility] 2/9 01/20/2021 11/22/2020 07/21/2020  Decreased Interest 0 1 0  Down, Depressed, Hopeless 0 2 0  PHQ - 2 Score 0 3 0  Altered sleeping - 2 0  Tired, decreased energy - 2 0  Change in appetite - 2 1  Feeling bad or failure about yourself  - 1 0  Trouble concentrating - 1 0  Moving slowly or fidgety/restless - 2 0  Suicidal thoughts - 0 0  PHQ-9 Score - 13 1  Difficult doing work/chores - Very difficult -  Some recent data might be  hidden    History James Hoffman has a past medical history of Adrenal insufficiency ([place]), Cancer ([place]), Hydrocephalus ([place]), Osteoporosis, and Thyroid disease.   He has a past surgical history that includes brain tumor removal (August 2012); Hip surgery; teeth removal; and Eye surgery (Right, 2014).   His family history is not on file.He reports that he has been smoking cigarettes. He started smoking about 8 years ago. He has a 1.50 pack-year smoking history. He has never used smokeless tobacco. He reports that he does not drink alcohol and does not use drugs.    ROS Review of Systems  Unable to perform ROS: Patient nonverbal    Objective:  BP 108/66   Temp (!) 97 F (36.1 C)   Ht 5\' 10"  (1.778 m)   Wt 141 lb 9.6 oz (64.2 kg)   SpO2 99%   BMI 20.32 kg/m   BP Readings from Last 3 Encounters:  01/20/21 108/66  01/05/21 91/64  11/22/20 114/81    Wt Readings from Last 3 Encounters:  01/20/21 141 lb 9.6 oz (64.2 kg)  11/22/20 136 lb (61.7 kg)  07/14/20 142 lb (64.4 kg)     Physical Exam Vitals reviewed.  Constitutional:      Appearance: He is  well-developed. He is ill-appearing (Musculature is atrophic and he is cachectic).     Comments:   Underweight  HENT:     Head: Normocephalic and atraumatic.     Right Ear: External ear normal.     Left Ear: External ear normal.     Mouth/Throat:     Pharynx: No oropharyngeal exudate or posterior oropharyngeal erythema.  Eyes:     Pupils: Pupils are equal, round, and reactive to light.  Cardiovascular:     Rate and Rhythm: Regular rhythm. Tachycardia present.     Heart sounds: No murmur heard.   Pulmonary:     Effort: No respiratory distress.     Breath sounds: Rhonchi (These are scattered and possibly upper respiratory) present. No rales.  Musculoskeletal:     Cervical back: Normal range of motion and neck supple.     Comments: Generally weak with atrophic musculature  Skin:    Findings: Bruising (Most of the ball of the  right shoulder over the deltoid is covered with bruising.  There is significant large amounts of bruising at the central lumbar region down to the sacrum) present.  Neurological:     Mental Status: He is alert.    Initially his heart rate was recorded at 220/min.  By the time an EKG could be performed, It was down to 120.  There is no record of cardiac distress.  However his aunt does say that he has had a lot of congestion.   XR - no acute changes at shoulder, lowerback, No infiltrate noted on CXR  Assessment & Plan:   James Hoffman was seen today for back pain.  Diagnoses and all orders for this visit:  Tachycardia -     EKG 12-Lead  Acute low back pain without sciatica, unspecified back pain laterality -     DG Lumbar Spine 2-3 Views; Future  Contusion of right shoulder, initial encounter -     DG Shoulder Right; Future  Fall, initial encounter -     DG Shoulder Right; Future -     DG Lumbar Spine 2-3 Views; Future -     DG Chest 2 View; Future  Caffeine withdrawal ([place])       I am having James Hoffman maintain his Testosterone, Cholecalciferol, levETIRAcetam, atorvastatin, thiamine, levothyroxine, escitalopram, clotrimazole-betamethasone, fluticasone, benzonatate, busPIRone, albuterol, AeroChamber Plus, and naproxen.  Allergies as of 01/20/2021      Reactions   Tramadol Anaphylaxis   Morphine Nausea And Vomiting   According to mother the patient is intolerant of morphine; causes nausea and vomiting.  Has had none since he was 25yo.      Medication List       Accurate as of January 20, 2021  2:52 PM. If you have any questions, ask your nurse or doctor.        AeroChamber Plus inhaler Use as instructed   albuterol 108 (90 Base) MCG/ACT inhaler Commonly known as: VENTOLIN HFA Inhale 2 puffs into the lungs every 6 (six) hours as needed for wheezing or shortness of breath.   atorvastatin 40 MG tablet Commonly known as: LIPITOR   benzonatate 200 MG  capsule Commonly known as: TESSALON Take 1 capsule (200 mg total) by mouth 3 (three) times daily as needed.   busPIRone 5 MG tablet Commonly known as: BUSPAR Take 1 tablet (5 mg total) by mouth 3 (three) times daily. For anxiety   Cholecalciferol 50 MCG (2000 UT) Caps Take by mouth.   clotrimazole-betamethasone cream Commonly known  as: Lotrisone Apply 1 application topically 2 (two) times daily. x7-10   escitalopram 10 MG tablet Commonly known as: Lexapro Take 1 tablet (10 mg total) by mouth daily.   fluticasone 50 MCG/ACT nasal spray Commonly known as: FLONASE Place 2 sprays into both nostrils daily.   levETIRAcetam 750 MG tablet Commonly known as: KEPPRA Take 1,500 mg by mouth in the morning and at bedtime.   levothyroxine 112 MCG tablet Commonly known as: SYNTHROID Take 1 tablet by mouth daily.   naproxen 500 MG tablet Commonly known as: NAPROSYN TAKE 1 TABLET (500 MG TOTAL) BY MOUTH 2 (TWO) TIMES DAILY AS NEEDED FOR MODERATE PAIN.   Testosterone 20.25 MG/ACT (1.62%) Gel ONE PUM ON EACH SHOULDER EVERY MORNIG   thiamine 100 MG tablet Take by mouth.        Follow-up: Return if symptoms worsen or fail to improve.  Claretta Fraise, M.D.

## 2021-01-21 ENCOUNTER — Telehealth: Payer: Medicare Other

## 2021-01-22 ENCOUNTER — Other Ambulatory Visit: Payer: Self-pay | Admitting: Family Medicine

## 2021-01-22 DIAGNOSIS — M87052 Idiopathic aseptic necrosis of left femur: Secondary | ICD-10-CM

## 2021-01-24 ENCOUNTER — Ambulatory Visit (INDEPENDENT_AMBULATORY_CARE_PROVIDER_SITE_OTHER): Payer: Medicare Other | Admitting: Licensed Clinical Social Worker

## 2021-01-24 ENCOUNTER — Ambulatory Visit (HOSPITAL_COMMUNITY): Payer: Medicare Other

## 2021-01-24 DIAGNOSIS — E890 Postprocedural hypothyroidism: Secondary | ICD-10-CM | POA: Diagnosis not present

## 2021-01-24 DIAGNOSIS — M818 Other osteoporosis without current pathological fracture: Secondary | ICD-10-CM

## 2021-01-24 DIAGNOSIS — Z87898 Personal history of other specified conditions: Secondary | ICD-10-CM

## 2021-01-24 DIAGNOSIS — F322 Major depressive disorder, single episode, severe without psychotic features: Secondary | ICD-10-CM

## 2021-01-24 DIAGNOSIS — C716 Malignant neoplasm of cerebellum: Secondary | ICD-10-CM

## 2021-01-24 DIAGNOSIS — I9589 Other hypotension: Secondary | ICD-10-CM

## 2021-01-24 DIAGNOSIS — C801 Malignant (primary) neoplasm, unspecified: Secondary | ICD-10-CM

## 2021-01-24 NOTE — Chronic Care Management (AMB) (Signed)
Chronic Care Management    Clinical Social Work Note  01/24/2021 Name: RAYLON LAMSON MRN: [id number] DOB: Oct 22, 1996  [name] is a 25 y.o. year old male who is a primary care patient of [name], DO. The CCM team was consulted to assist the patient with chronic disease management and/or care coordination needs related to: [name]   Engaged with patient/Courtney Florene Glen, aunt, by telephone for follow up visit in response to provider referral for social work chronic care management and care coordination services.   Consent to Services:  The patient was given information about Chronic Care Management services, agreed to services, and gave verbal consent prior to initiation of services.  Please see initial visit note for detailed documentation.   Patient agreed to services and consent obtained.   Assessment: Review of patient past medical history, allergies, medications, and health status, including review of relevant consultants reports was performed today as part of a comprehensive evaluation and provision of chronic care management and care coordination services.     SDOH (Social Determinants of Health) assessments and interventions performed:    Advanced Directives Status: See Vynca application for related entries.  CCM Care Plan  Allergies  Allergen Reactions  . Tramadol Anaphylaxis  . Morphine Nausea And Vomiting    According to mother the patient is intolerant of morphine; causes nausea and vomiting.  Has had none since he was 25yo.    Outpatient Encounter Medications as of 01/24/2021  Medication Sig  . albuterol (VENTOLIN HFA) 108 (90 Base) MCG/ACT inhaler Inhale 2 puffs into the lungs every 6 (six) hours as needed for wheezing or shortness of breath.  Marland Kitchen atorvastatin (LIPITOR) 40 MG tablet   . benzonatate (TESSALON) 200 MG capsule Take 1 capsule (200 mg total) by mouth 3 (three) times daily as needed. (Patient not taking: No sig reported)   . busPIRone (BUSPAR) 5 MG tablet Take 1 tablet (5 mg total) by mouth 3 (three) times daily. For anxiety  . Cholecalciferol 50 MCG (2000 UT) CAPS Take by mouth.  . clotrimazole-betamethasone (LOTRISONE) cream Apply 1 application topically 2 (two) times daily. x7-10  . escitalopram (LEXAPRO) 10 MG tablet Take 1 tablet (10 mg total) by mouth daily.  . fluticasone (FLONASE) 50 MCG/ACT nasal spray Place 2 sprays into both nostrils daily.  Marland Kitchen levETIRAcetam (KEPPRA) 750 MG tablet Take 1,500 mg by mouth in the morning and at bedtime.  Marland Kitchen levothyroxine (SYNTHROID) 112 MCG tablet Take 1 tablet by mouth daily.  . naproxen (NAPROSYN) 500 MG tablet TAKE 1 TABLET (500 MG TOTAL) BY MOUTH 2 (TWO) TIMES DAILY AS NEEDED FOR MODERATE PAIN.  Marland Kitchen Spacer/Aero-Holding Chambers (AEROCHAMBER PLUS) inhaler Use as instructed  . Testosterone 20.25 MG/ACT (1.62%) GEL ONE PUM ON EACH SHOULDER EVERY MORNIG  . thiamine 100 MG tablet Take by mouth.   No facility-administered encounter medications on file as of 01/24/2021.    Patient Active Problem List   Diagnosis Date Noted  . Abnormal chest x-ray 06/24/2020  . Decreased lung sounds 06/24/2020  . Wound check, abscess 05/20/2020  . Depression, major, single episode, severe ([place]) 05/20/2020  . 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[place]) 12/09/2019  . Blindness and low vision 10/29/2019  . History of eye surgery 10/29/2019  . History of brain tumor 10/29/2019  . Gait instability 12/19/2016  . Hypogonadism, male 07/19/2016  . Normocytic anemia 04/14/2016  . Muscular deconditioning 09/17/2015  . Hypothyroidism 09/09/2015  . Medullary carcinoma ([place]) 09/09/2015  . Adrenal insufficiency ([place]) 09/09/2015  .  Hypotension 09/09/2015  . Hyponatremia 09/09/2015  . Hypokalemia 09/09/2015    Conditions to be addressed/monitored: ; Mobility issues, skin care issues, in home care needs  LCSW spoke via phone with [name], aunt of client, regarding client current status and needs.  [name] and  LCSW spoke of upcoming client appointments. [name] reported that client is sleeping well and eating adequately. She said client does have pain issues and said client has had a history of seizures. She said Frederic sees oncologist, Dr. Clovis Riley as scheduled.  [name] also spoke of incontinency issues of client. [name] said client continues to use hospital bed in home as needed and client continues to use Boost supplement. [name] spoke of client memory issues, saying client is not remembering things as well for the past month or so. She said that client has an appointment scheduled for later this month with a neuropsychiatrist.  She was concerned about client memory decline recently.     Follow Up Plan: LCSW to call client or family representative in next 4 weeks to discuss client needs at that time  Eulalio Reamy.Forrest MSW, LCSW Licensed Clinical Social Worker [facility] Care Management [phone]

## 2021-01-24 NOTE — Patient Instructions (Addendum)
Visit Information  Chronic Care Management   Clinical Social Work Note   01/24/2021  Name: James Hoffman MRN: [id number] DOB: [birth year]   James Hoffman is a 25 y.o. year old male who is a primary care patient of [name], DO. The CCM team was consulted to assist the patient with chronic disease management and/or care coordination needs related to: [name]   Engaged with patient/James Hoffman, aunt, by telephone for follow up visit in response to provider referral for social work chronic care management and care coordination services.   Consent to Services:  The patient was given information about Chronic Care Management services, agreed to services, and gave verbal consent prior to initiation of services. Please see initial visit note for detailed documentation.   Patient agreed to services and consent obtained.   Assessment: Review of patient past medical history, allergies, medications, and health status, including review of relevant consultants reports was performed today as part of a comprehensive evaluation and provision of chronic care management and care coordination services.  SDOH (Social Determinants of Health) assessments and interventions performed:   Advanced Directives Status: See Vynca application for related entries.        Allergies  Allergen Reactions  . Tramadol Anaphylaxis  . Morphine Nausea And Vomiting    According to mother the patient is intolerant of morphine; causes nausea and vomiting. Has had none since he was 25yo.       Outpatient Encounter Medications as of 01/24/2021  Medication Sig  . albuterol (VENTOLIN HFA) 108 (90 Base) MCG/ACT inhaler Inhale 2 puffs into the lungs every 6 (six) hours as needed for wheezing or shortness of breath.  Marland Kitchen atorvastatin (LIPITOR) 40 MG tablet   . benzonatate (TESSALON) 200 MG capsule Take 1 capsule (200 mg total) by mouth 3 (three) times daily as needed. (Patient not taking: No sig  reported)  . busPIRone (BUSPAR) 5 MG tablet Take 1 tablet (5 mg total) by mouth 3 (three) times daily. For anxiety  . Cholecalciferol 50 MCG (2000 UT) CAPS Take by mouth.  . clotrimazole-betamethasone (LOTRISONE) cream Apply 1 application topically 2 (two) times daily. x7-10  . escitalopram (LEXAPRO) 10 MG tablet Take 1 tablet (10 mg total) by mouth daily.  . fluticasone (FLONASE) 50 MCG/ACT nasal spray Place 2 sprays into both nostrils daily.  Marland Kitchen levETIRAcetam (KEPPRA) 750 MG tablet Take 1,500 mg by mouth in the morning and at bedtime.  Marland Kitchen levothyroxine (SYNTHROID) 112 MCG tablet Take 1 tablet by mouth daily.  . naproxen (NAPROSYN) 500 MG tablet TAKE 1 TABLET (500 MG TOTAL) BY MOUTH 2 (TWO) TIMES DAILY AS NEEDED FOR MODERATE PAIN.  Marland Kitchen Spacer/Aero-Holding Chambers (AEROCHAMBER PLUS) inhaler Use as instructed  . Testosterone 20.25 MG/ACT (1.62%) GEL ONE PUM ON EACH SHOULDER EVERY MORNIG  . thiamine 100 MG tablet Take by mouth.   No facility-administered encounter medications on file as of 01/24/2021.       Patient Active Problem List   Diagnosis Date Noted  . Abnormal chest x-ray 06/24/2020  . Decreased lung sounds 06/24/2020  . Wound check, abscess 05/20/2020  . Depression, major, single episode, severe ([place]) 05/20/2020  . 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 Blindness and low vision 10/29/2019  . History of eye surgery 10/29/2019  . History of brain tumor 10/29/2019  . Gait instability 12/19/2016  . Hypogonadism, male 07/19/2016  . Normocytic anemia 04/14/2016  . Muscular deconditioning 09/17/2015  . Hypothyroidism 09/09/2015  . Medullary carcinoma ([place]) 09/09/2015  .  Adrenal insufficiency ([place]) 09/09/2015  . Hypotension 09/09/2015  . Hyponatremia 09/09/2015  . Hypokalemia 09/09/2015   Conditions to be addressed/monitored: ; Mobility issues, skin care issues, in home care needs   LCSW spoke via phone with James Hoffman, aunt of client, regarding client current status and needs.  [name] and LCSW spoke of upcoming client appointments. [name] reported that client is sleeping well and eating adequately. She said client does have pain issues and said client has had a history of seizures. She said Angelos sees oncologist, Dr. Clovis Riley as scheduled. [name] also spoke of incontinency issues of client. [name] said client continues to use hospital bed in home as needed and client continues to use Boost supplement. [name] spoke of client memory issues, saying client is not remembering things as well for the past month or so. She said that client has an appointment scheduled for later this month with a neuropsychiatrist. She was concerned about client memory decline recently.   Follow Up Plan: LCSW to call client or family representative in next 4 weeks to discuss client needs at that time   Nethan Caudillo.Kimaya Whitlatch MSW, LCSW Licensed Clinical Social Worker [facility] Care Management [phone]

## 2021-01-28 ENCOUNTER — Other Ambulatory Visit: Payer: Self-pay | Admitting: Family Medicine

## 2021-01-28 DIAGNOSIS — F419 Anxiety disorder, unspecified: Secondary | ICD-10-CM

## 2021-02-01 NOTE — Telephone Encounter (Signed)
Pt has been seen in office since this telephone call, will close encounter.

## 2021-02-02 DIAGNOSIS — F0390 Unspecified dementia without behavioral disturbance: Secondary | ICD-10-CM | POA: Diagnosis not present

## 2021-02-02 DIAGNOSIS — C716 Malignant neoplasm of cerebellum: Secondary | ICD-10-CM | POA: Diagnosis not present

## 2021-02-02 DIAGNOSIS — H547 Unspecified visual loss: Secondary | ICD-10-CM | POA: Diagnosis not present

## 2021-02-02 DIAGNOSIS — H9190 Unspecified hearing loss, unspecified ear: Secondary | ICD-10-CM | POA: Diagnosis not present

## 2021-02-09 DIAGNOSIS — C716 Malignant neoplasm of cerebellum: Secondary | ICD-10-CM | POA: Diagnosis not present

## 2021-02-09 DIAGNOSIS — R838 Other abnormal findings in cerebrospinal fluid: Secondary | ICD-10-CM | POA: Diagnosis not present

## 2021-02-09 DIAGNOSIS — R062 Wheezing: Secondary | ICD-10-CM | POA: Diagnosis not present

## 2021-02-14 DIAGNOSIS — C716 Malignant neoplasm of cerebellum: Secondary | ICD-10-CM | POA: Diagnosis not present

## 2021-02-14 DIAGNOSIS — G40219 Localization-related (focal) (partial) symptomatic epilepsy and epileptic syndromes with complex partial seizures, intractable, without status epilepticus: Secondary | ICD-10-CM | POA: Diagnosis not present

## 2021-02-14 DIAGNOSIS — Z7952 Long term (current) use of systemic steroids: Secondary | ICD-10-CM | POA: Diagnosis not present

## 2021-02-14 DIAGNOSIS — G9340 Encephalopathy, unspecified: Secondary | ICD-10-CM | POA: Diagnosis not present

## 2021-02-24 ENCOUNTER — Telehealth: Payer: Medicare Other

## 2021-03-01 ENCOUNTER — Telehealth: Payer: Medicare Other

## 2021-03-09 ENCOUNTER — Other Ambulatory Visit: Payer: Self-pay | Admitting: Family Medicine

## 2021-03-09 DIAGNOSIS — J9801 Acute bronchospasm: Secondary | ICD-10-CM

## 2021-03-09 DIAGNOSIS — R062 Wheezing: Secondary | ICD-10-CM

## 2021-03-14 ENCOUNTER — Ambulatory Visit (INDEPENDENT_AMBULATORY_CARE_PROVIDER_SITE_OTHER): Payer: Medicare Other | Admitting: Family Medicine

## 2021-03-14 ENCOUNTER — Encounter: Payer: Self-pay | Admitting: Family Medicine

## 2021-03-14 DIAGNOSIS — R062 Wheezing: Secondary | ICD-10-CM | POA: Diagnosis not present

## 2021-03-14 DIAGNOSIS — J988 Other specified respiratory disorders: Secondary | ICD-10-CM

## 2021-03-14 MED ORDER — FLUTICASONE PROPIONATE HFA 44 MCG/ACT IN AERO: 2.0000 | INHALATION_SPRAY | Freq: Two times a day (BID) | RESPIRATORY_TRACT | 12 refills | Status: DC

## 2021-03-14 MED ORDER — FLUTICASONE PROPIONATE 50 MCG/ACT NA SUSP: 2.0000 | Freq: Every day | NASAL | 6 refills | Status: DC

## 2021-03-14 MED ORDER — CETIRIZINE HCL 10 MG PO TABS: 10.0000 mg | ORAL_TABLET | Freq: Every day | ORAL | 11 refills | Status: DC

## 2021-03-14 MED ORDER — DOXYCYCLINE HYCLATE 100 MG PO TABS: 100.0000 mg | ORAL_TABLET | Freq: Two times a day (BID) | ORAL | 0 refills | Status: AC

## 2021-03-14 NOTE — Progress Notes (Signed)
   Virtual Visit  Note Due to COVID-19 pandemic this visit was conducted virtually. This visit type was conducted due to national recommendations for restrictions regarding the COVID-19 Pandemic (e.g. social distancing, sheltering in place) in an effort to limit this patient's exposure and mitigate transmission in our community. All issues noted in this document were discussed and addressed.  A physical exam was not performed with this format.  I connected with James Hoffman on 03/14/21 at 1030 by telephone and verified that I am speaking with the correct person using two identifiers. James Hoffman is currently located at home and his aunt is currently with him during the visit. The provider, Gwenlyn Perking, FNP is located in their office at time of visit.  I discussed the limitations, risks, security and privacy concerns of performing an evaluation and management service by telephone and the availability of in person appointments. I also discussed with the patient that there may be a patient responsible charge related to this service. The patient expressed understanding and agreed to proceed.  CC: cough  History and Present Illness:  HPI  History was provided by the aunt who is one of James Hoffman's caregivers. James Hoffman has had a cough with nasal congestion x 3 weeks. Also reports wheezing and shortness of breath that is relived by his albuterol inhaler. He is using this BID with good relief. Denies fever, sore throat, or chest pain. His oncologist is placing a referral to pulmonology for him. He has had pneumonia in the past. He has been taking mucinex and flonase with little relief.     ROS As per HPI.   Observations/Objective: Exam deferred for telephone visit.   Assessment and Plan: James Hoffman was seen today for cough.  Diagnoses and all orders for this visit:  Respiratory infection Doxycycline as below. Zyrtec and flonase daily. Continue albuterol as needed. Will add Flovent BID  as well for wheezing.  -     fluticasone (FLONASE) 50 MCG/ACT nasal spray; Place 2 sprays into both nostrils daily. -     doxycycline (VIBRA-TABS) 100 MG tablet; Take 1 tablet (100 mg total) by mouth 2 (two) times daily for 10 days. 1 po bid -     cetirizine (ZYRTEC) 10 MG tablet; Take 1 tablet (10 mg total) by mouth daily.  Wheezes -     fluticasone (FLOVENT HFA) 44 MCG/ACT inhaler; Inhale 2 puffs into the lungs 2 (two) times daily.   Follow Up Instructions: Return to office for new or worsening symptoms, or if symptoms persist.      I discussed the assessment and treatment plan with the patient. The patient was provided an opportunity to ask questions and all were answered. The patient agreed with the plan and demonstrated an understanding of the instructions.   The patient was advised to call back or seek an in-person evaluation if the symptoms worsen or if the condition fails to improve as anticipated.  The above assessment and management plan was discussed with the patient. The patient verbalized understanding of and has agreed to the management plan. Patient is aware to call the clinic if symptoms persist or worsen. Patient is aware when to return to the clinic for a follow-up visit. Patient educated on when it is appropriate to go to the emergency department.   Time call ended:  1045  I provided 15 minutes of  non face-to-face time during this encounter.    Gwenlyn Perking, FNP

## 2021-03-21 DIAGNOSIS — Z7952 Long term (current) use of systemic steroids: Secondary | ICD-10-CM | POA: Diagnosis not present

## 2021-03-21 DIAGNOSIS — G9340 Encephalopathy, unspecified: Secondary | ICD-10-CM | POA: Diagnosis not present

## 2021-03-21 DIAGNOSIS — C716 Malignant neoplasm of cerebellum: Secondary | ICD-10-CM | POA: Diagnosis not present

## 2021-03-21 DIAGNOSIS — G40219 Localization-related (focal) (partial) symptomatic epilepsy and epileptic syndromes with complex partial seizures, intractable, without status epilepticus: Secondary | ICD-10-CM | POA: Diagnosis not present

## 2021-03-22 DIAGNOSIS — G9340 Encephalopathy, unspecified: Secondary | ICD-10-CM | POA: Diagnosis not present

## 2021-03-22 DIAGNOSIS — C716 Malignant neoplasm of cerebellum: Secondary | ICD-10-CM | POA: Diagnosis not present

## 2021-03-22 DIAGNOSIS — G40219 Localization-related (focal) (partial) symptomatic epilepsy and epileptic syndromes with complex partial seizures, intractable, without status epilepticus: Secondary | ICD-10-CM | POA: Diagnosis not present

## 2021-03-22 DIAGNOSIS — Z7952 Long term (current) use of systemic steroids: Secondary | ICD-10-CM | POA: Diagnosis not present

## 2021-03-22 DIAGNOSIS — Z79899 Other long term (current) drug therapy: Secondary | ICD-10-CM | POA: Diagnosis not present

## 2021-03-23 DIAGNOSIS — Z7952 Long term (current) use of systemic steroids: Secondary | ICD-10-CM | POA: Diagnosis not present

## 2021-03-23 DIAGNOSIS — G40219 Localization-related (focal) (partial) symptomatic epilepsy and epileptic syndromes with complex partial seizures, intractable, without status epilepticus: Secondary | ICD-10-CM | POA: Diagnosis not present

## 2021-03-23 DIAGNOSIS — G9340 Encephalopathy, unspecified: Secondary | ICD-10-CM | POA: Diagnosis not present

## 2021-03-23 DIAGNOSIS — C716 Malignant neoplasm of cerebellum: Secondary | ICD-10-CM | POA: Diagnosis not present

## 2021-03-24 DIAGNOSIS — G40219 Localization-related (focal) (partial) symptomatic epilepsy and epileptic syndromes with complex partial seizures, intractable, without status epilepticus: Secondary | ICD-10-CM | POA: Diagnosis not present

## 2021-03-24 DIAGNOSIS — C716 Malignant neoplasm of cerebellum: Secondary | ICD-10-CM | POA: Diagnosis not present

## 2021-03-24 DIAGNOSIS — G9340 Encephalopathy, unspecified: Secondary | ICD-10-CM | POA: Diagnosis not present

## 2021-03-24 DIAGNOSIS — Z7952 Long term (current) use of systemic steroids: Secondary | ICD-10-CM | POA: Diagnosis not present

## 2021-03-25 DIAGNOSIS — G40219 Localization-related (focal) (partial) symptomatic epilepsy and epileptic syndromes with complex partial seizures, intractable, without status epilepticus: Secondary | ICD-10-CM | POA: Diagnosis not present

## 2021-03-25 DIAGNOSIS — G9340 Encephalopathy, unspecified: Secondary | ICD-10-CM | POA: Diagnosis not present

## 2021-03-25 DIAGNOSIS — Z7952 Long term (current) use of systemic steroids: Secondary | ICD-10-CM | POA: Diagnosis not present

## 2021-03-25 DIAGNOSIS — C716 Malignant neoplasm of cerebellum: Secondary | ICD-10-CM | POA: Diagnosis not present

## 2021-03-29 ENCOUNTER — Other Ambulatory Visit: Payer: Self-pay

## 2021-03-29 ENCOUNTER — Ambulatory Visit (INDEPENDENT_AMBULATORY_CARE_PROVIDER_SITE_OTHER): Payer: Medicare Other | Admitting: Family Medicine

## 2021-03-29 ENCOUNTER — Ambulatory Visit (INDEPENDENT_AMBULATORY_CARE_PROVIDER_SITE_OTHER): Payer: Medicare Other

## 2021-03-29 ENCOUNTER — Encounter: Payer: Self-pay | Admitting: Family Medicine

## 2021-03-29 VITALS — BP 95/63 | HR 96 | Temp 96.5°F | Ht 70.0 in

## 2021-03-29 DIAGNOSIS — R059 Cough, unspecified: Secondary | ICD-10-CM

## 2021-03-29 DIAGNOSIS — B370 Candidal stomatitis: Secondary | ICD-10-CM | POA: Diagnosis not present

## 2021-03-29 DIAGNOSIS — C716 Malignant neoplasm of cerebellum: Secondary | ICD-10-CM

## 2021-03-29 MED ORDER — FLUCONAZOLE 100 MG PO TABS: ORAL_TABLET | ORAL | 0 refills | Status: DC

## 2021-03-29 NOTE — Progress Notes (Signed)
Subjective:  Patient ID: James Hoffman, male    DOB: 09-12-1996  Age: 25 y.o. MRN: [id number]  CC: Cough and Thrush   HPI AZION CENTRELLA presents for third recent infection of chest with cough. Cough is dry. No fever. Denies dyspnea. History limited due to mental state. Caregiver says he hasn't been "right" since his hospitalization last fall. She states he has no teeth so he doesn't brush. HE was noted by another caregiver that he had plaques on the tongue. He says it hurts a little. The symptoms are intermittent through the day for about three weeks since onset. Two others noted 1 & two months prior. Some improvement without resolution bbetween each states caregiver.  Depression screen [facility] 2/9 03/29/2021 01/20/2021 11/22/2020  Decreased Interest 1 0 1  Down, Depressed, Hopeless 1 0 2  PHQ - 2 Score 2 0 3  Altered sleeping 1 - 2  Tired, decreased energy 3 - 2  Change in appetite 3 - 2  Feeling bad or failure about yourself  1 - 1  Trouble concentrating 3 - 1  Moving slowly or fidgety/restless 3 - 2  Suicidal thoughts 0 - 0  PHQ-9 Score 16 - 13  Difficult doing work/chores Not difficult at all - Very difficult  Some recent data might be hidden    History Magnum has a past medical history of Adrenal insufficiency ([place]), Cancer ([place]), Hydrocephalus ([place]), Osteoporosis, and Thyroid disease.   He has a past surgical history that includes brain tumor removal (August 2012); Hip surgery; teeth removal; and Eye surgery (Right, 2014).   His family history is not on file.He reports that he has been smoking cigarettes. He started smoking about 8 years ago. He has a 1.50 pack-year smoking history. He has never used smokeless tobacco. He reports that he does not drink alcohol and does not use drugs.    ROS Review of Systems  Unable to perform ROS: Mental status change    Objective:  BP 95/63   Pulse 96   Temp (!) 96.5 F (35.8 C)   Ht 5\' 10"  (1.778 m)   SpO2 97%   BMI 20.32  kg/m   BP Readings from Last 3 Encounters:  03/29/21 95/63  01/20/21 108/66  01/05/21 91/64    Wt Readings from Last 3 Encounters:  01/20/21 141 lb 9.6 oz (64.2 kg)  11/22/20 136 lb (61.7 kg)  07/14/20 142 lb (64.4 kg)     Physical Exam Vitals reviewed.  Constitutional:      Appearance: He is well-developed.  HENT:     Head: Normocephalic and atraumatic.     Right Ear: Tympanic membrane and external ear normal. No decreased hearing noted.     Left Ear: Tympanic membrane and external ear normal. No decreased hearing noted.     Mouth/Throat:     Pharynx: No oropharyngeal exudate or posterior oropharyngeal erythema.  Eyes:     Pupils: Pupils are equal, round, and reactive to light.  Cardiovascular:     Rate and Rhythm: Normal rate and regular rhythm.     Heart sounds: No murmur heard.   Pulmonary:     Effort: No respiratory distress.     Breath sounds: Normal breath sounds.  Abdominal:     General: Bowel sounds are normal.     Palpations: Abdomen is soft. There is no mass.     Tenderness: There is no abdominal tenderness.  Musculoskeletal:     Cervical back: Normal range of motion and  neck supple.     Comments: Wheelchair bound       Assessment & Plan:   Kwan was seen today for cough and thrush.  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 Then starting the next day take one daily until all are taken.       I am having [name] start on fluconazole. I am also having him maintain his Testosterone, Cholecalciferol, levETIRAcetam, atorvastatin, thiamine, levothyroxine, escitalopram, clotrimazole-betamethasone, benzonatate, AeroChamber Plus, naproxen, busPIRone, Ventolin HFA, fluticasone, cetirizine, and fluticasone.  Allergies as of 03/29/2021      Reactions   Tramadol Anaphylaxis    Morphine Nausea And Vomiting   According to mother the patient is intolerant of morphine; causes nausea and vomiting.  Has had none since he was 25yo.      Medication List       Accurate as of Mar 29, 2021 10:59 AM. If you have any questions, ask your nurse or doctor.        AeroChamber Plus inhaler Use as instructed   atorvastatin 40 MG tablet Commonly known as: LIPITOR   benzonatate 200 MG capsule Commonly known as: TESSALON Take 1 capsule (200 mg total) by mouth 3 (three) times daily as needed.   busPIRone 5 MG tablet Commonly known as: BUSPAR TAKE 1 TABLET (5 MG TOTAL) BY MOUTH 3 (THREE) TIMES DAILY. FOR ANXIETY   cetirizine 10 MG tablet Commonly known as: ZYRTEC Take 1 tablet (10 mg total) by mouth daily.   Cholecalciferol 50 MCG (2000 UT) Caps Take by mouth.   clotrimazole-betamethasone cream Commonly known as: Lotrisone Apply 1 application topically 2 (two) times daily. x7-10   escitalopram 10 MG tablet Commonly known as: Lexapro Take 1 tablet (10 mg total) by mouth daily.   fluconazole 100 MG tablet Commonly known as: Diflucan Take two with first dose. Then starting the next day take one daily until all are taken. Started by: Claretta Fraise, MD   fluticasone 44 MCG/ACT inhaler Commonly known as: FLOVENT HFA Inhale 2 puffs into the lungs 2 (two) times daily.   fluticasone 50 MCG/ACT nasal spray Commonly known as: FLONASE Place 2 sprays into both nostrils daily.   levETIRAcetam 750 MG tablet Commonly known as: KEPPRA Take 1,500 mg by mouth in the morning and at bedtime.   levothyroxine 112 MCG tablet Commonly known as: SYNTHROID Take 1 tablet by mouth daily.   naproxen 500 MG tablet Commonly known as: NAPROSYN TAKE 1 TABLET (500 MG TOTAL) BY MOUTH 2 (TWO) TIMES DAILY AS NEEDED FOR MODERATE PAIN.   Testosterone 20.25 MG/ACT (1.62%) Gel ONE PUM ON EACH SHOULDER EVERY MORNIG   thiamine 100 MG tablet Take by mouth.   Ventolin HFA 108 (90 Base)  MCG/ACT inhaler Generic drug: albuterol TAKE 2 PUFFS BY MOUTH EVERY 6 HOURS AS NEEDED FOR WHEEZE OR SHORTNESS OF BREATH        Follow-up: No follow-ups on file.  Claretta Fraise, M.D.

## 2021-03-29 NOTE — Progress Notes (Signed)
Your chest x-ray looked normal. Thanks, WS.

## 2021-04-02 IMAGING — DX DG CHEST 2V
2 series · 2 of 2 positions shown · non-contrast
Comparison: Chest radiograph January 05, 2021

CLINICAL DATA: Pain following fall

EXAM:
CHEST - 2 VIEW

[chest pa]
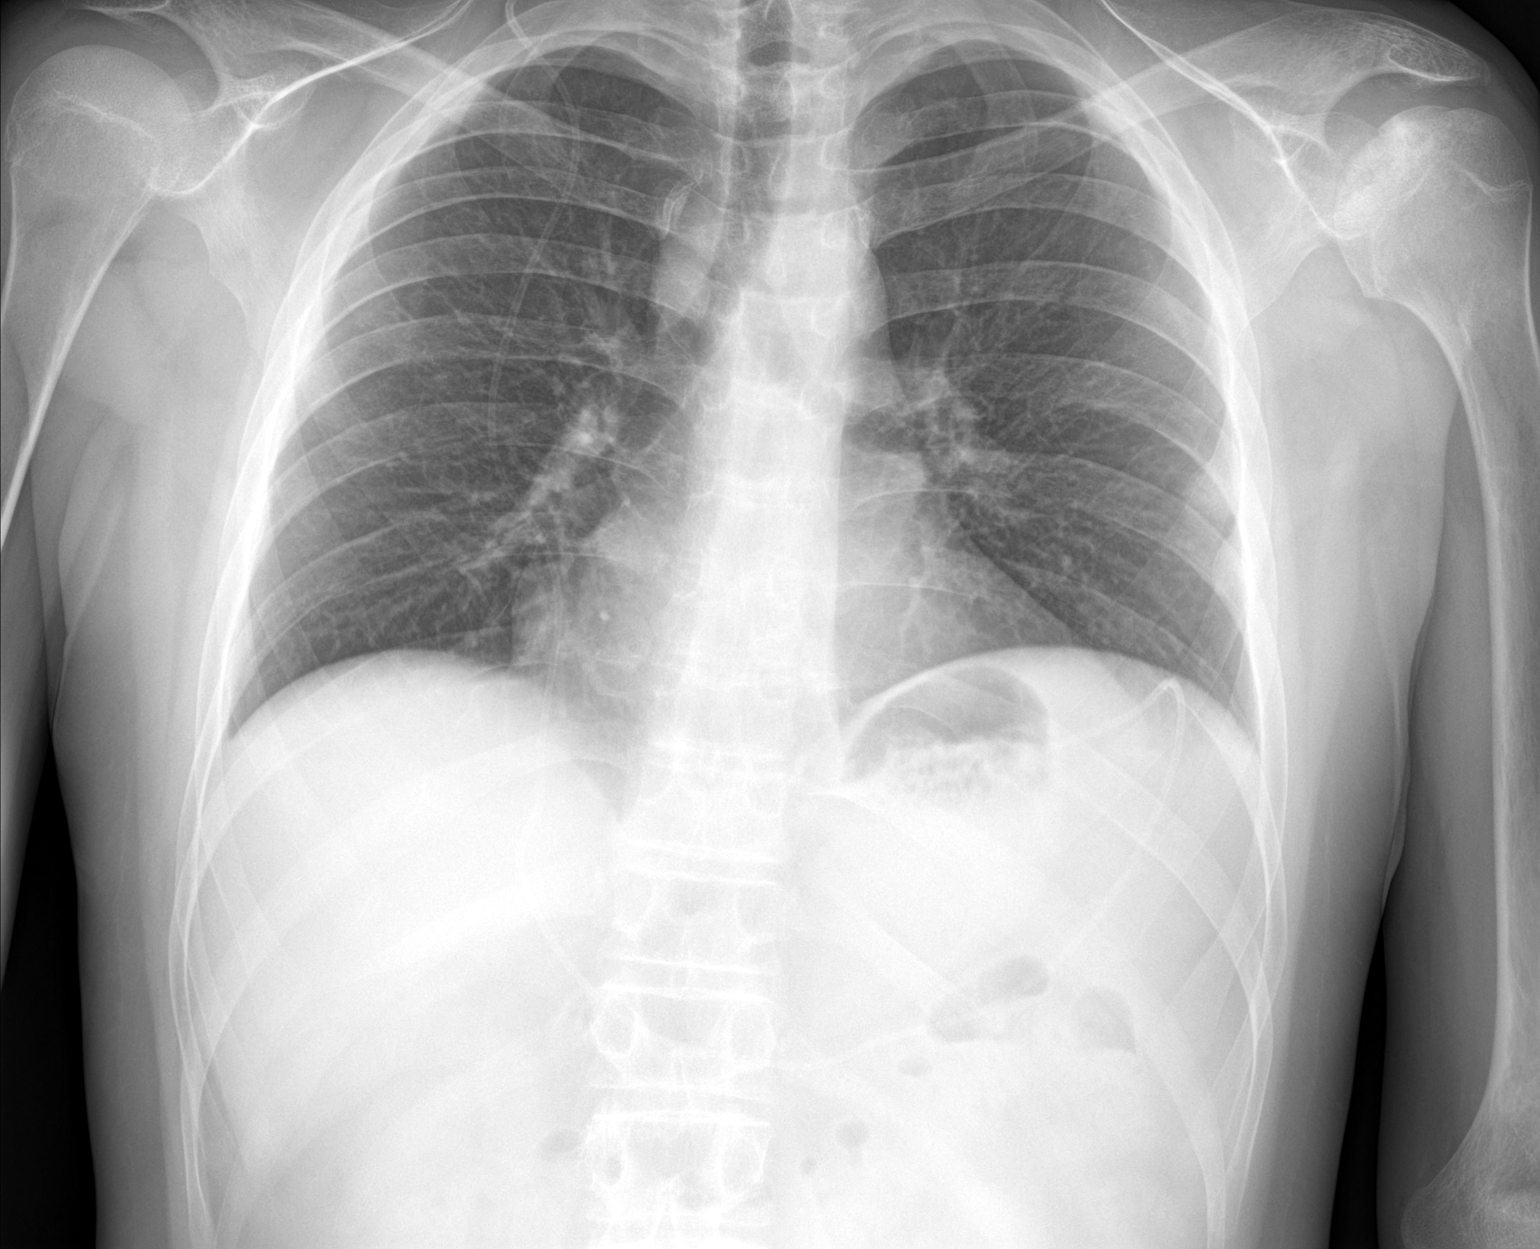

[chest lat]
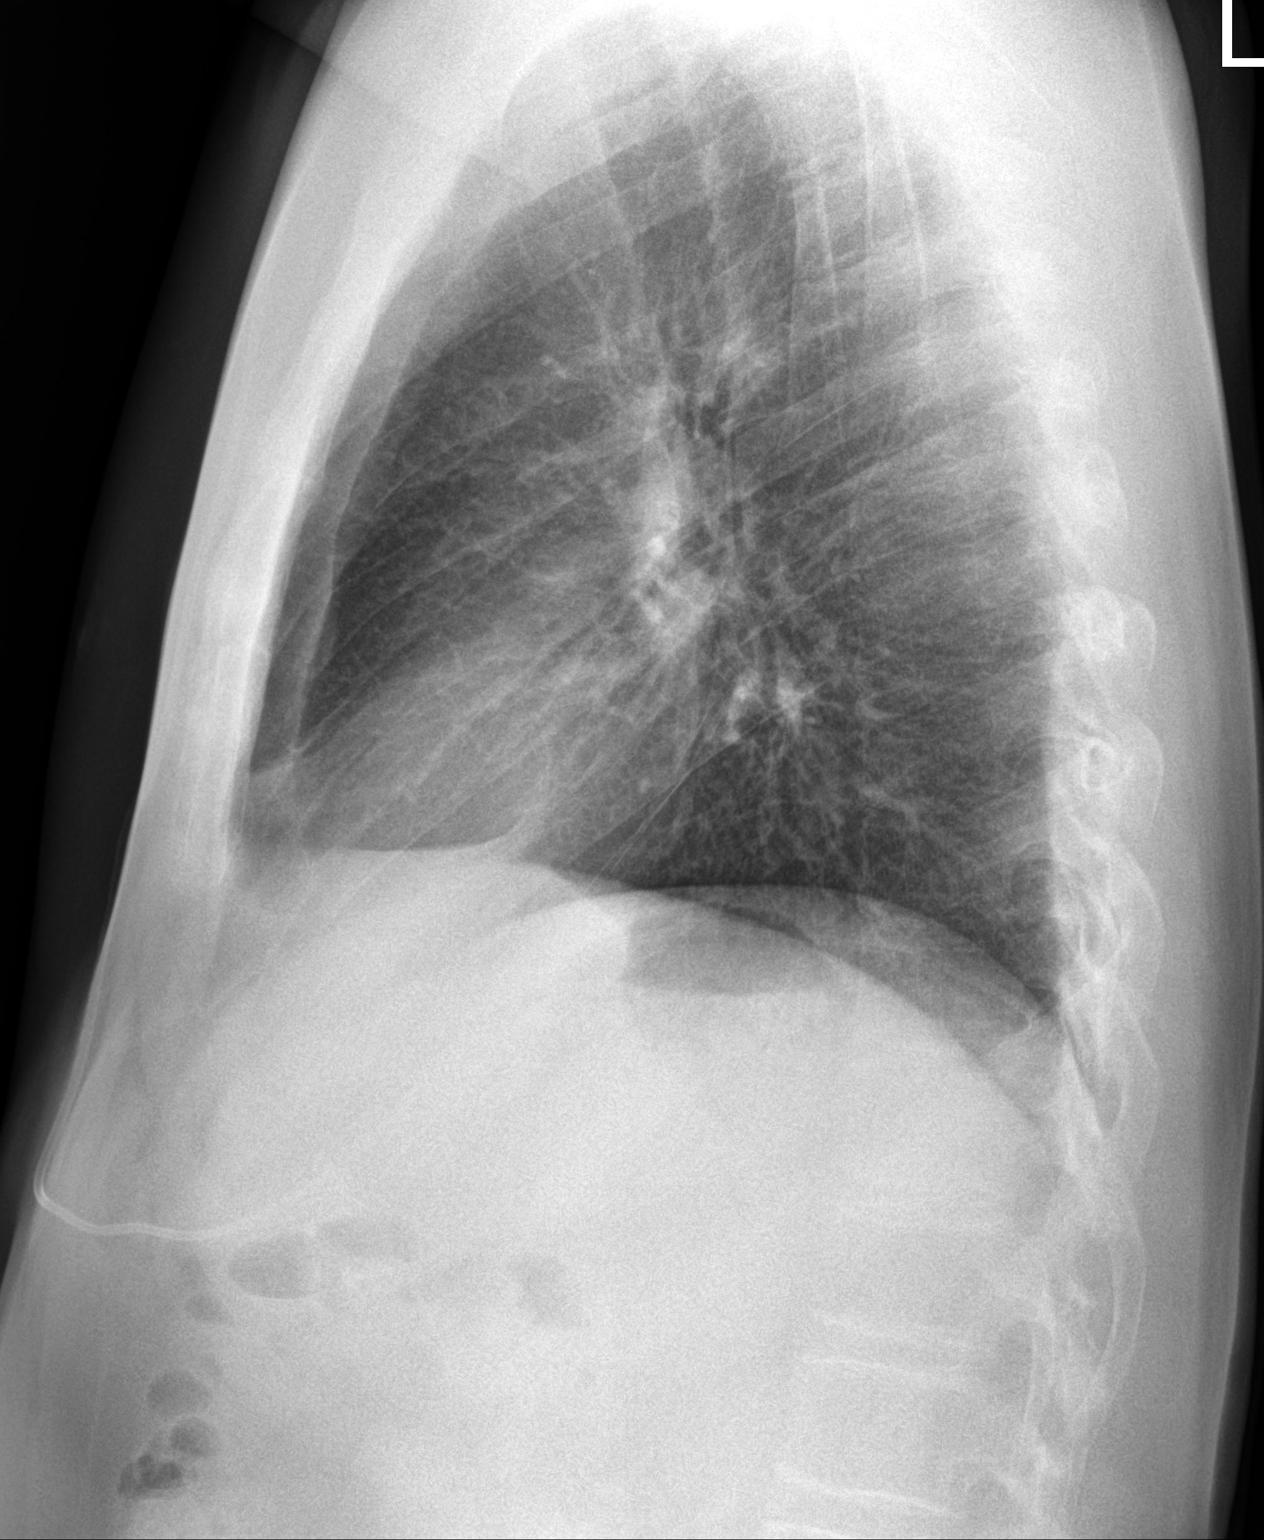

[2 of 2 positions shown; findings below may reference images not displayed]

FINDINGS: Lungs are clear. Heart size and pulmonary vascular normal. No
adenopathy. Shunt extends along the right hemithorax with tip in
left upper quadrant. There are stable healing fractures of the
lateral left fifth and sixth ribs. Sclerosis in the left humeral
head region again noted. No new bony abnormality.
IMPRESSION: Lungs clear.  Heart size normal.

Apparent avascular necrosis left femoral head. Healing rib fractures
on the left, stable. Shunt present extending along the right
hemithorax with tip in left upper abdomen.

## 2021-04-02 IMAGING — DX DG LUMBAR SPINE 2-3V
2 series · 2 of 2 positions shown · non-contrast
Comparison: None.

CLINICAL DATA: Low back pain

EXAM:
LUMBAR SPINE - 2-3 VIEW

[l-spine ap]
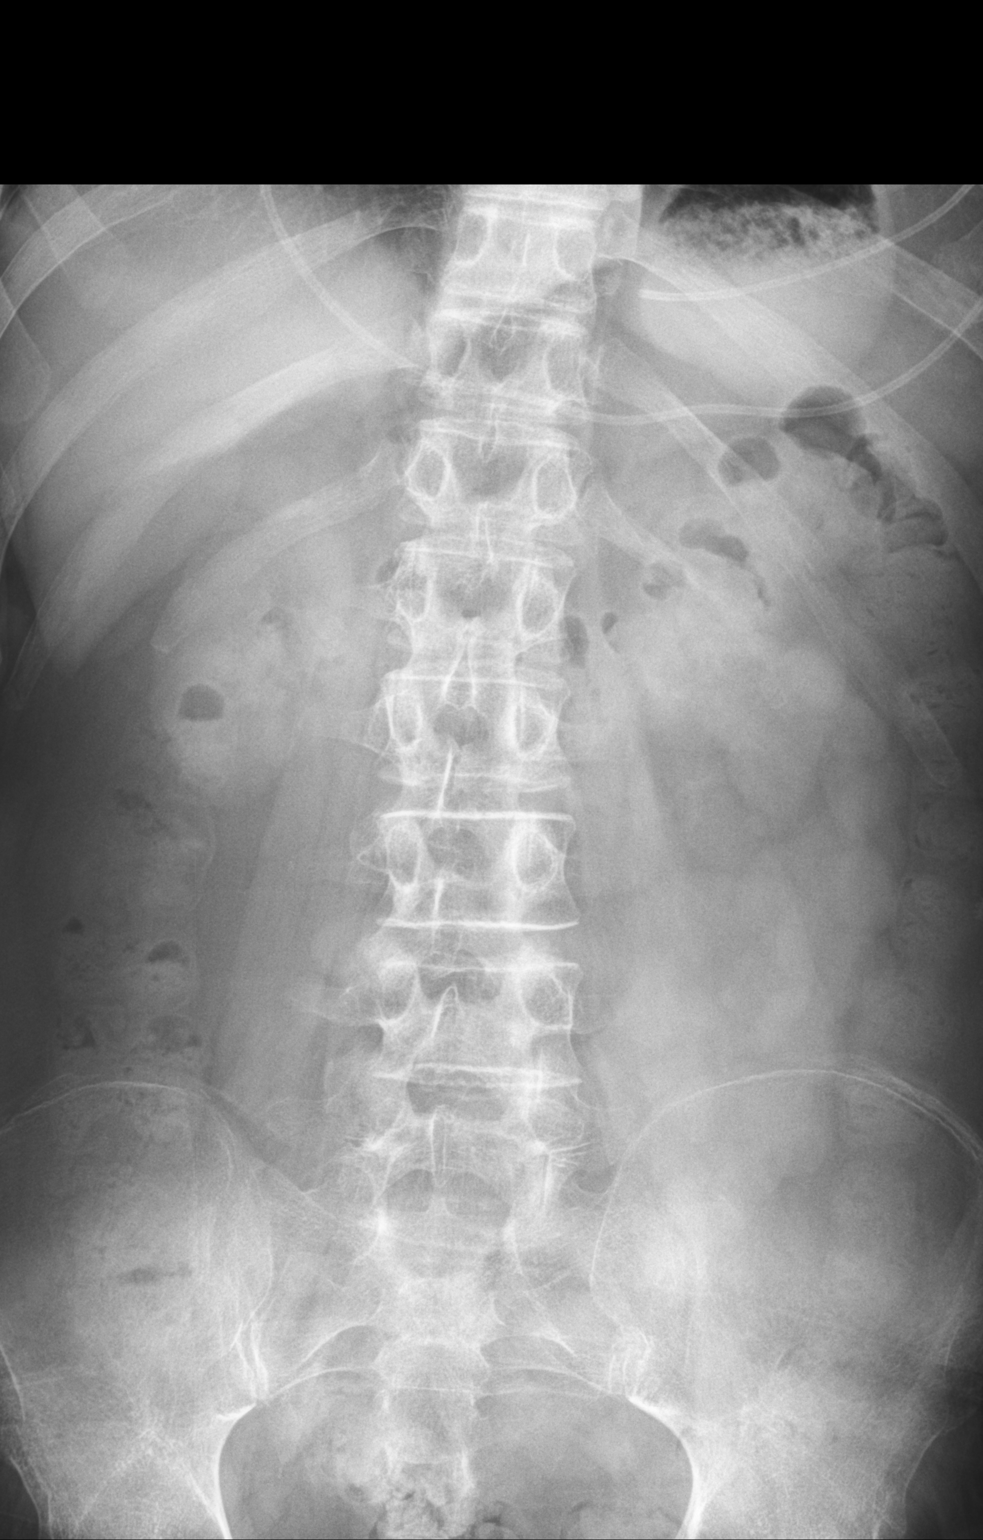

[l-spine lat]
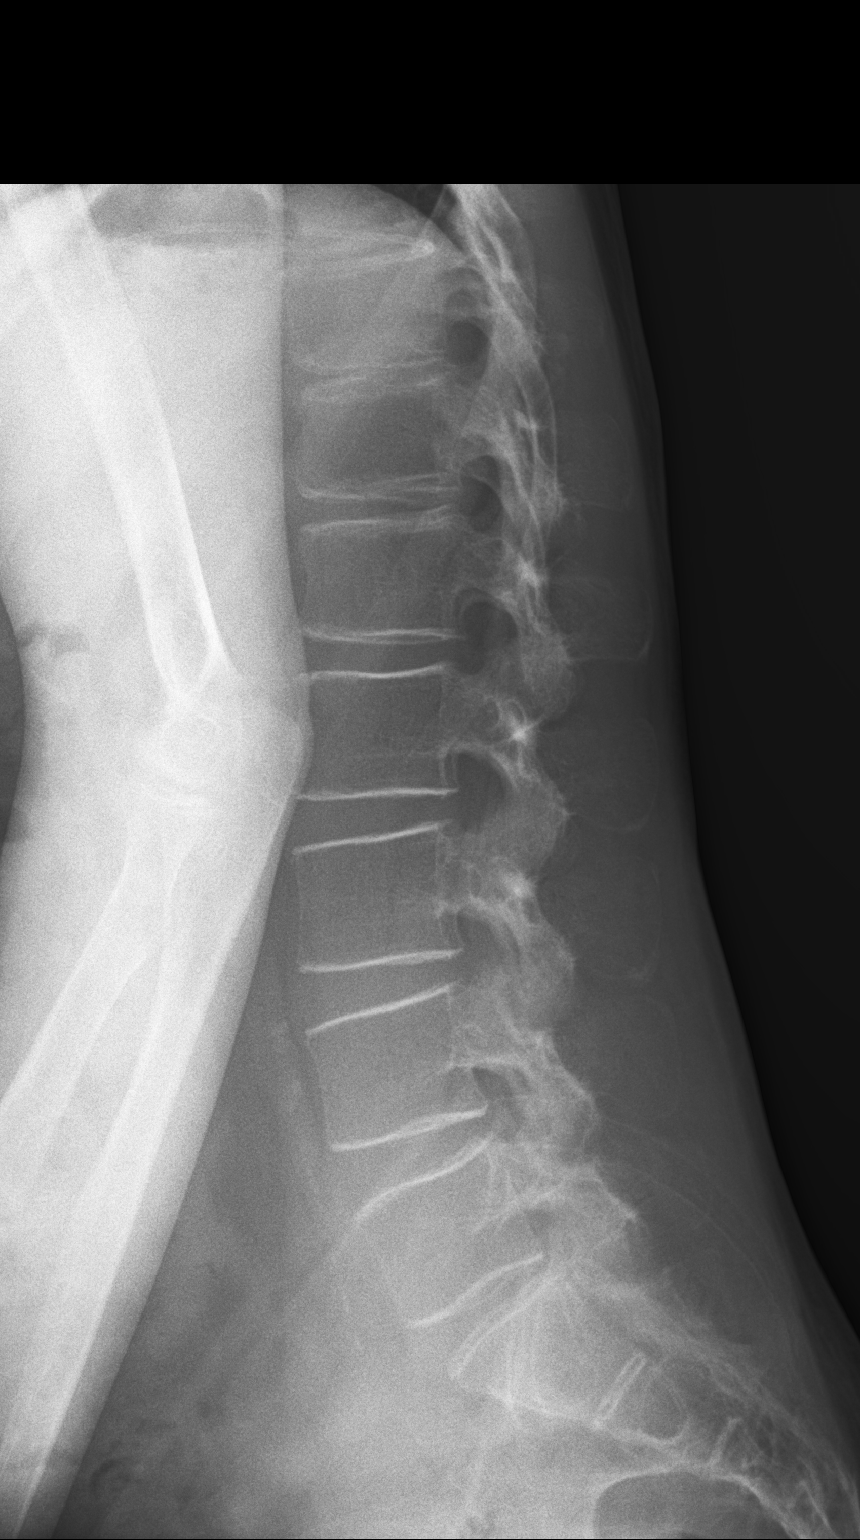

[2 of 2 positions shown; findings below may reference images not displayed]

FINDINGS: Upright frontal and upright lateral views were obtained. There are 5
non-rib-bearing lumbar type vertebral bodies. No fracture or
spondylolisthesis. The disc spaces appear normal. There is a shunt
with tip in the medial left upper abdomen.
IMPRESSION: No fracture or spondylolisthesis. No appreciable arthropathic
change.

## 2021-04-06 DIAGNOSIS — E230 Hypopituitarism: Secondary | ICD-10-CM | POA: Diagnosis not present

## 2021-04-06 DIAGNOSIS — R062 Wheezing: Secondary | ICD-10-CM | POA: Diagnosis not present

## 2021-04-06 DIAGNOSIS — E038 Other specified hypothyroidism: Secondary | ICD-10-CM | POA: Diagnosis not present

## 2021-04-06 DIAGNOSIS — C716 Malignant neoplasm of cerebellum: Secondary | ICD-10-CM | POA: Diagnosis not present

## 2021-04-07 ENCOUNTER — Other Ambulatory Visit: Payer: Self-pay | Admitting: Family Medicine

## 2021-04-07 ENCOUNTER — Telehealth: Payer: Medicare Other

## 2021-04-07 DIAGNOSIS — E038 Other specified hypothyroidism: Secondary | ICD-10-CM | POA: Diagnosis not present

## 2021-04-07 DIAGNOSIS — E230 Hypopituitarism: Secondary | ICD-10-CM | POA: Diagnosis not present

## 2021-04-07 DIAGNOSIS — E2749 Other adrenocortical insufficiency: Secondary | ICD-10-CM | POA: Diagnosis not present

## 2021-04-08 DIAGNOSIS — R638 Other symptoms and signs concerning food and fluid intake: Secondary | ICD-10-CM | POA: Diagnosis not present

## 2021-04-08 DIAGNOSIS — C716 Malignant neoplasm of cerebellum: Secondary | ICD-10-CM | POA: Diagnosis not present

## 2021-04-08 DIAGNOSIS — R112 Nausea with vomiting, unspecified: Secondary | ICD-10-CM | POA: Diagnosis not present

## 2021-04-08 DIAGNOSIS — R4182 Altered mental status, unspecified: Secondary | ICD-10-CM | POA: Diagnosis not present

## 2021-04-13 DIAGNOSIS — F0390 Unspecified dementia without behavioral disturbance: Secondary | ICD-10-CM | POA: Diagnosis not present

## 2021-04-13 DIAGNOSIS — Z712 Person consulting for explanation of examination or test findings: Secondary | ICD-10-CM | POA: Diagnosis not present

## 2021-04-20 DIAGNOSIS — Z712 Person consulting for explanation of examination or test findings: Secondary | ICD-10-CM | POA: Diagnosis not present

## 2021-04-20 DIAGNOSIS — F0390 Unspecified dementia without behavioral disturbance: Secondary | ICD-10-CM | POA: Diagnosis not present

## 2021-04-25 DIAGNOSIS — C716 Malignant neoplasm of cerebellum: Secondary | ICD-10-CM | POA: Diagnosis not present

## 2021-05-03 ENCOUNTER — Other Ambulatory Visit: Payer: Self-pay | Admitting: Family Medicine

## 2021-05-03 DIAGNOSIS — F419 Anxiety disorder, unspecified: Secondary | ICD-10-CM

## 2021-05-06 DIAGNOSIS — M25412 Effusion, left shoulder: Secondary | ICD-10-CM | POA: Diagnosis not present

## 2021-05-06 DIAGNOSIS — M67912 Unspecified disorder of synovium and tendon, left shoulder: Secondary | ICD-10-CM | POA: Diagnosis not present

## 2021-05-06 DIAGNOSIS — M19012 Primary osteoarthritis, left shoulder: Secondary | ICD-10-CM | POA: Diagnosis not present

## 2021-05-06 DIAGNOSIS — M879 Osteonecrosis, unspecified: Secondary | ICD-10-CM | POA: Diagnosis not present

## 2021-05-09 DIAGNOSIS — G40219 Localization-related (focal) (partial) symptomatic epilepsy and epileptic syndromes with complex partial seizures, intractable, without status epilepticus: Secondary | ICD-10-CM | POA: Diagnosis not present

## 2021-05-09 DIAGNOSIS — F17210 Nicotine dependence, cigarettes, uncomplicated: Secondary | ICD-10-CM | POA: Diagnosis not present

## 2021-05-09 DIAGNOSIS — J690 Pneumonitis due to inhalation of food and vomit: Secondary | ICD-10-CM | POA: Diagnosis not present

## 2021-05-13 DIAGNOSIS — H6123 Impacted cerumen, bilateral: Secondary | ICD-10-CM | POA: Diagnosis not present

## 2021-05-13 DIAGNOSIS — J690 Pneumonitis due to inhalation of food and vomit: Secondary | ICD-10-CM | POA: Diagnosis not present

## 2021-05-13 DIAGNOSIS — Z7982 Long term (current) use of aspirin: Secondary | ICD-10-CM | POA: Diagnosis not present

## 2021-05-13 DIAGNOSIS — F17210 Nicotine dependence, cigarettes, uncomplicated: Secondary | ICD-10-CM | POA: Diagnosis not present

## 2021-05-13 DIAGNOSIS — E876 Hypokalemia: Secondary | ICD-10-CM | POA: Diagnosis not present

## 2021-05-13 DIAGNOSIS — D72829 Elevated white blood cell count, unspecified: Secondary | ICD-10-CM | POA: Diagnosis not present

## 2021-05-13 DIAGNOSIS — E2749 Other adrenocortical insufficiency: Secondary | ICD-10-CM | POA: Diagnosis not present

## 2021-05-13 DIAGNOSIS — D630 Anemia in neoplastic disease: Secondary | ICD-10-CM | POA: Diagnosis not present

## 2021-05-13 DIAGNOSIS — E230 Hypopituitarism: Secondary | ICD-10-CM | POA: Diagnosis not present

## 2021-05-13 DIAGNOSIS — D690 Allergic purpura: Secondary | ICD-10-CM | POA: Diagnosis not present

## 2021-05-13 DIAGNOSIS — H903 Sensorineural hearing loss, bilateral: Secondary | ICD-10-CM | POA: Diagnosis not present

## 2021-05-13 DIAGNOSIS — M1612 Unilateral primary osteoarthritis, left hip: Secondary | ICD-10-CM | POA: Diagnosis not present

## 2021-05-13 DIAGNOSIS — J9601 Acute respiratory failure with hypoxia: Secondary | ICD-10-CM | POA: Diagnosis not present

## 2021-05-13 DIAGNOSIS — G40219 Localization-related (focal) (partial) symptomatic epilepsy and epileptic syndromes with complex partial seizures, intractable, without status epilepticus: Secondary | ICD-10-CM | POA: Diagnosis not present

## 2021-05-13 DIAGNOSIS — C716 Malignant neoplasm of cerebellum: Secondary | ICD-10-CM | POA: Diagnosis not present

## 2021-05-13 DIAGNOSIS — M810 Age-related osteoporosis without current pathological fracture: Secondary | ICD-10-CM | POA: Diagnosis not present

## 2021-05-13 DIAGNOSIS — M222X9 Patellofemoral disorders, unspecified knee: Secondary | ICD-10-CM | POA: Diagnosis not present

## 2021-05-13 DIAGNOSIS — R32 Unspecified urinary incontinence: Secondary | ICD-10-CM | POA: Diagnosis not present

## 2021-05-13 DIAGNOSIS — M25562 Pain in left knee: Secondary | ICD-10-CM | POA: Diagnosis not present

## 2021-05-17 DIAGNOSIS — J690 Pneumonitis due to inhalation of food and vomit: Secondary | ICD-10-CM | POA: Diagnosis not present

## 2021-05-17 DIAGNOSIS — J9601 Acute respiratory failure with hypoxia: Secondary | ICD-10-CM | POA: Diagnosis not present

## 2021-05-17 DIAGNOSIS — D630 Anemia in neoplastic disease: Secondary | ICD-10-CM | POA: Diagnosis not present

## 2021-05-17 DIAGNOSIS — E2749 Other adrenocortical insufficiency: Secondary | ICD-10-CM | POA: Diagnosis not present

## 2021-05-17 DIAGNOSIS — C716 Malignant neoplasm of cerebellum: Secondary | ICD-10-CM | POA: Diagnosis not present

## 2021-05-17 DIAGNOSIS — G40219 Localization-related (focal) (partial) symptomatic epilepsy and epileptic syndromes with complex partial seizures, intractable, without status epilepticus: Secondary | ICD-10-CM | POA: Diagnosis not present

## 2021-05-18 ENCOUNTER — Telehealth: Payer: Self-pay | Admitting: Family Medicine

## 2021-05-18 DIAGNOSIS — G8929 Other chronic pain: Secondary | ICD-10-CM | POA: Diagnosis not present

## 2021-05-18 DIAGNOSIS — Z85841 Personal history of malignant neoplasm of brain: Secondary | ICD-10-CM | POA: Diagnosis not present

## 2021-05-18 DIAGNOSIS — Z08 Encounter for follow-up examination after completed treatment for malignant neoplasm: Secondary | ICD-10-CM | POA: Diagnosis not present

## 2021-05-18 DIAGNOSIS — Z923 Personal history of irradiation: Secondary | ICD-10-CM | POA: Diagnosis not present

## 2021-05-18 DIAGNOSIS — Z23 Encounter for immunization: Secondary | ICD-10-CM | POA: Diagnosis not present

## 2021-05-18 DIAGNOSIS — H9193 Unspecified hearing loss, bilateral: Secondary | ICD-10-CM | POA: Diagnosis not present

## 2021-05-18 DIAGNOSIS — E291 Testicular hypofunction: Secondary | ICD-10-CM | POA: Diagnosis not present

## 2021-05-18 DIAGNOSIS — E039 Hypothyroidism, unspecified: Secondary | ICD-10-CM | POA: Diagnosis not present

## 2021-05-18 DIAGNOSIS — C716 Malignant neoplasm of cerebellum: Secondary | ICD-10-CM | POA: Diagnosis not present

## 2021-05-18 DIAGNOSIS — E2740 Unspecified adrenocortical insufficiency: Secondary | ICD-10-CM | POA: Diagnosis not present

## 2021-05-18 NOTE — Telephone Encounter (Signed)
Looks like in office last time he was here his heart rate was 96 so 104 is not anything I would be alarmed about.  Unless he is symptomatic with chest pains or palpitations or trouble breathing.  Just monitor in the future

## 2021-05-19 DIAGNOSIS — J9601 Acute respiratory failure with hypoxia: Secondary | ICD-10-CM | POA: Diagnosis not present

## 2021-05-19 DIAGNOSIS — J690 Pneumonitis due to inhalation of food and vomit: Secondary | ICD-10-CM | POA: Diagnosis not present

## 2021-05-19 DIAGNOSIS — D630 Anemia in neoplastic disease: Secondary | ICD-10-CM | POA: Diagnosis not present

## 2021-05-19 DIAGNOSIS — C716 Malignant neoplasm of cerebellum: Secondary | ICD-10-CM | POA: Diagnosis not present

## 2021-05-19 DIAGNOSIS — G40219 Localization-related (focal) (partial) symptomatic epilepsy and epileptic syndromes with complex partial seizures, intractable, without status epilepticus: Secondary | ICD-10-CM | POA: Diagnosis not present

## 2021-05-19 DIAGNOSIS — E2749 Other adrenocortical insufficiency: Secondary | ICD-10-CM | POA: Diagnosis not present

## 2021-05-20 NOTE — Telephone Encounter (Signed)
James [facility] at [facility] aware of recommendations.

## 2021-05-23 ENCOUNTER — Ambulatory Visit (INDEPENDENT_AMBULATORY_CARE_PROVIDER_SITE_OTHER): Payer: Medicare Other | Admitting: Licensed Clinical Social Worker

## 2021-05-23 DIAGNOSIS — R1312 Dysphagia, oropharyngeal phase: Secondary | ICD-10-CM | POA: Diagnosis not present

## 2021-05-23 DIAGNOSIS — C716 Malignant neoplasm of cerebellum: Secondary | ICD-10-CM

## 2021-05-23 DIAGNOSIS — C801 Malignant (primary) neoplasm, unspecified: Secondary | ICD-10-CM

## 2021-05-23 DIAGNOSIS — I9589 Other hypotension: Secondary | ICD-10-CM

## 2021-05-23 DIAGNOSIS — Z87898 Personal history of other specified conditions: Secondary | ICD-10-CM

## 2021-05-23 DIAGNOSIS — F322 Major depressive disorder, single episode, severe without psychotic features: Secondary | ICD-10-CM | POA: Diagnosis not present

## 2021-05-23 DIAGNOSIS — E890 Postprocedural hypothyroidism: Secondary | ICD-10-CM

## 2021-05-23 DIAGNOSIS — R1310 Dysphagia, unspecified: Secondary | ICD-10-CM | POA: Diagnosis not present

## 2021-05-23 NOTE — Patient Instructions (Signed)
Visit Information  PATIENT GOALS:  Goals Addressed             This Visit's Progress    Manage My Emotions; Manage Depression issues faced       Timeframe:  Short-Term Goal Priority:  Medium Progress: On Track Start Date:             05/23/21                Expected End Date:           08/10/21            Follow Up Date:  06/30/21   Manage Emotions. Manage Depression issues faced    Why is this important?   When you are stressed, down or upset, your body reacts too.  For example, your blood pressure may get higher; you may have a headache or stomachache.  When your emotions get the best of you, your body's ability to fight off cold and flu gets weak.  These steps will help you manage your emotions.     Patient Self Care Activities:  Attends scheduled medical appointments Has strong family support  Patient Coping Strengths:  Supportive Relationships Family  Patient Self Care Deficits:  Mobility issues Transport needs  Patient Goals:  - spend time or talk with others at least 2 to 3 times per week - practice relaxation or meditation daily - keep a calendar with appointment dates  Follow Up Plan: LCSW to call client on 06/30/21     Kasson Lamere.Halee Glynn MSW, LCSW Licensed Clinical Social Worker [facility] Care Management [phone]

## 2021-05-23 NOTE — Chronic Care Management (AMB) (Signed)
Chronic Care Management    Clinical Social Work Note  05/23/2021 Name: James Hoffman MRN: [id number] DOB: 1996/10/07  James Hoffman is a 25 y.o. year old male who is a primary care patient of James Norlander, DO. The CCM team was consulted to assist the patient with chronic disease management and/or care coordination needs related to: [name]   Engaged with patient / grandmother of patient, James Hoffman, by telephone for follow up visit in response to provider referral for social work chronic care management and care coordination services.   Consent to Services:  The patient was given information about Chronic Care Management services, agreed to services, and gave verbal consent prior to initiation of services.  Please see initial visit note for detailed documentation.   Patient agreed to services and consent obtained.   Assessment: Review of patient past medical history, allergies, medications, and health status, including review of relevant consultants reports was performed today as part of a comprehensive evaluation and provision of chronic care management and care coordination services.     SDOH (Social Determinants of Health) assessments and interventions performed:  SDOH Interventions    Flowsheet Row Most Recent Value  SDOH Interventions   Depression Interventions/Treatment  Currently on Treatment        Advanced Directives Status: See Vynca application for related entries.  CCM Care Plan  Allergies  Allergen Reactions   Tramadol Anaphylaxis   Morphine Nausea And Vomiting    According to mother the patient is intolerant of morphine; causes nausea and vomiting.  Has had none since he was 25yo.    Outpatient Encounter Medications as of 05/23/2021  Medication Sig   atorvastatin (LIPITOR) 40 MG tablet    benzonatate (TESSALON) 200 MG capsule Take 1 capsule (200 mg total) by mouth 3 (three) times daily as needed.   busPIRone (BUSPAR) 5 MG tablet  TAKE 1 TABLET (5 MG TOTAL) BY MOUTH 3 (THREE) TIMES DAILY. FOR ANXIETY   cetirizine (ZYRTEC) 10 MG tablet Take 1 tablet (10 mg total) by mouth daily.   Cholecalciferol 50 MCG (2000 UT) CAPS Take by mouth.   clotrimazole-betamethasone (LOTRISONE) cream Apply 1 application topically 2 (two) times daily. x7-10   escitalopram (LEXAPRO) 10 MG tablet Take 1 tablet (10 mg total) by mouth daily.   fluconazole (DIFLUCAN) 100 MG tablet Take two with first dose. Then starting the next day take one daily until all are taken.   fluticasone (FLONASE) 50 MCG/ACT nasal spray Place 2 sprays into both nostrils daily.   fluticasone (FLOVENT HFA) 44 MCG/ACT inhaler Inhale 2 puffs into the lungs 2 (two) times daily.   levETIRAcetam (KEPPRA) 750 MG tablet Take 1,500 mg by mouth in the morning and at bedtime.   levothyroxine (SYNTHROID) 112 MCG tablet Take 1 tablet by mouth daily.   naproxen (NAPROSYN) 500 MG tablet TAKE 1 TABLET (500 MG TOTAL) BY MOUTH 2 (TWO) TIMES DAILY AS NEEDED FOR MODERATE PAIN.   Spacer/Aero-Holding Chambers (AEROCHAMBER PLUS) inhaler Use as instructed   Testosterone 20.25 MG/ACT (1.62%) GEL ONE PUM ON EACH SHOULDER EVERY MORNIG   thiamine 100 MG tablet Take by mouth.   VENTOLIN HFA 108 (90 Base) MCG/ACT inhaler TAKE 2 PUFFS BY MOUTH EVERY 6 HOURS AS NEEDED FOR WHEEZE OR SHORTNESS OF BREATH   No facility-administered encounter medications on file as of 05/23/2021.    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[place]) 12/09/2019   Blindness and low vision 10/29/2019   History of eye surgery 10/29/2019   History of brain tumor 10/29/2019   Gait instability 12/19/2016   Hypogonadism, male 07/19/2016   Normocytic anemia 04/14/2016   Muscular deconditioning 09/17/2015   Hypothyroidism 09/09/2015    Medullary carcinoma ([place]) 09/09/2015   Adrenal insufficiency ([place]) 09/09/2015   Hypotension 09/09/2015   Hyponatremia 09/09/2015   Hypokalemia 09/09/2015    Conditions to be addressed/monitored: Monitor mobility issues of client; monitor client management of depression issues   Care Plan : LCSW care plan  Updates made by Katha Cabal, LCSW since 05/23/2021 12:00 AM     Problem: Depression Identification (Depression)      Goal: Depressive Symptoms Identified: Manage depression sympotms faced   Start Date: 05/23/2021  Expected End Date: 08/11/2021  This Visit's Progress: On track  Priority: Medium  Note:   Current Barriers:  Chronic Mental Health needs related to depression and depression management Mobility issues Transportation needs Suicidal Ideation/Homicidal Ideation: No  Clinical Social Work Goal(s):  patient Medical illustrator will work with SW monthly by telephone or in person to reduce or manage symptoms related to depression and depression management Patient will attend all scheduled medical appointments in next 30 days Patient or family representative will call RNCM or LCSW as needed in next 30 days for CCM support  Interventions: 1:1 collaboration with James Norlander, DO regarding development and update of comprehensive plan of care as evidenced by provider attestation and co-signature Talked with James Hoffman, grandmother of client, about client needs Talked with James Hoffman about pain issues of client Talked with James Hoffman about hearing issues of client (she said client has hearing deficits) Talked with James Hoffman about weight loss of client Provided counseling support for James Hoffman related to needs of client Talked with James Hoffman about mobility challenges of client James Hoffman said client has leg exercises he tries to do daily; James Hoffman said client  does leg exercises twice a day and enjoys leg exercises) Talked with James Hoffman about memory challenges of client James Hoffman said she  has to remind [name] to eat to keep his weight up) Talked with James Hoffman about client use of Boost supplement Talked with James Hoffman about medication procurement of client Talked with James Hoffman about recent home visit of speech therapist with client Talked with James Hoffman about client visit today with medical provider related to swallowing issues of client Talked with James Hoffman abut sleeping issues of client Talked with James Hoffman about upcoming client medical appointments Talked with James Hoffman about transport needs of client Talked with James Hoffman about smoking of client Talked with James Hoffman about wheelchair use of client Talked with James Hoffman about vision of client  Talked with James Hoffman about client past use of hearing aid Talked with James Hoffman about incontinency issues of client  Encouraged James Hoffman or client to call RNCM as needed for nursing support Talked with James Hoffman about CAP support for client Talked with James Hoffman about client meals (she said he is eating small meals about 5 times daily)  Patient Self Care Activities:  Attends scheduled medical appointments Has strong family support  Patient Coping Strengths:  Supportive Relationships Family  Patient Self Care Deficits:  Mobility issues Transport needs  Patient Goals:  - spend time or talk with others at least 2 to 3 times per week - practice relaxation or meditation daily - keep a calendar with appointment dates  Follow Up Plan: LCSW to call  client or James Hoffman, grandmother, on 06/30/21     Rudell Ortman.Mina Carlisi MSW, LCSW Licensed Clinical Social Worker [facility] Care Management [phone]

## 2021-05-24 ENCOUNTER — Ambulatory Visit (INDEPENDENT_AMBULATORY_CARE_PROVIDER_SITE_OTHER): Payer: Medicare Other | Admitting: Family Medicine

## 2021-05-24 ENCOUNTER — Other Ambulatory Visit: Payer: Self-pay

## 2021-05-24 ENCOUNTER — Encounter: Payer: Self-pay | Admitting: Family Medicine

## 2021-05-24 VITALS — BP 119/73 | HR 110 | Temp 98.6°F | Ht 70.0 in | Wt 137.2 lb

## 2021-05-24 DIAGNOSIS — K219 Gastro-esophageal reflux disease without esophagitis: Secondary | ICD-10-CM

## 2021-05-24 DIAGNOSIS — E2749 Other adrenocortical insufficiency: Secondary | ICD-10-CM | POA: Diagnosis not present

## 2021-05-24 DIAGNOSIS — D630 Anemia in neoplastic disease: Secondary | ICD-10-CM | POA: Diagnosis not present

## 2021-05-24 DIAGNOSIS — R10826 Epigastric rebound abdominal tenderness: Secondary | ICD-10-CM

## 2021-05-24 DIAGNOSIS — R634 Abnormal weight loss: Secondary | ICD-10-CM

## 2021-05-24 DIAGNOSIS — G40219 Localization-related (focal) (partial) symptomatic epilepsy and epileptic syndromes with complex partial seizures, intractable, without status epilepticus: Secondary | ICD-10-CM | POA: Diagnosis not present

## 2021-05-24 DIAGNOSIS — C716 Malignant neoplasm of cerebellum: Secondary | ICD-10-CM | POA: Diagnosis not present

## 2021-05-24 DIAGNOSIS — J9601 Acute respiratory failure with hypoxia: Secondary | ICD-10-CM | POA: Diagnosis not present

## 2021-05-24 DIAGNOSIS — J690 Pneumonitis due to inhalation of food and vomit: Secondary | ICD-10-CM | POA: Diagnosis not present

## 2021-05-24 MED ORDER — OMEPRAZOLE 20 MG PO CPDR: 20.0000 mg | DELAYED_RELEASE_CAPSULE | Freq: Every day | ORAL | 6 refills | Status: DC

## 2021-05-24 NOTE — Progress Notes (Signed)
Subjective: CC: Weight loss PCP: [name], DO HAL:[id number] James Hoffman is a 25 y.o. male presenting to clinic today for:  1.  Weight loss Patient is brought to the office by his family member with regards to concern about a 15 pound weight loss.  He was recently seen by a specialist and told that he weighed 120 pounds.  His grandmother has been feeding him snacks frequently in efforts to recover some of this weight but they wanted to make sure that nothing else was going on but this is a very complex history of thyroid disease, adrenal insufficiency and history of social resection for brain tumor (medulloblastoma).   His oral intake has been better.  He is working with physical therapy to try and strengthen.  He is accompanied today by his aunt.  He has been suffering from what appears to be GERD.  He was seen by an ear nose and throat doctor and had an upper airway evaluation but they felt that he needed further evaluation by gastroenterology due to ongoing gagging and unremarkable evaluation by them..  She is asking for referral to gastroenterology at [facility] today.  Not currently taking anything for acid reflux but would be interested in doing so.  ROS: Per HPI  Allergies  Allergen Reactions   Tramadol Anaphylaxis   Morphine Nausea And Vomiting    According to mother the patient is intolerant of morphine; causes nausea and vomiting.  Has had none since he was 25yo.   Past Medical History:  Diagnosis Date   Adrenal insufficiency ([place])    Cancer ([place])    brain tumor on brain stem   Hydrocephalus ([place])    Osteoporosis    Thyroid disease     Current Outpatient Medications:    atorvastatin (LIPITOR) 40 MG tablet, , Disp: , Rfl:    benzonatate (TESSALON) 200 MG capsule, Take 1 capsule (200 mg total) by mouth 3 (three) times daily as needed., Disp: 30 capsule, Rfl: 1   busPIRone (BUSPAR) 5 MG tablet, TAKE 1 TABLET (5 MG TOTAL) BY MOUTH 3 (THREE) TIMES  DAILY. FOR ANXIETY, Disp: 270 tablet, Rfl: 0   cetirizine (ZYRTEC) 10 MG tablet, Take 1 tablet (10 mg total) by mouth daily., Disp: 30 tablet, Rfl: 11   Cholecalciferol 50 MCG (2000 UT) CAPS, Take by mouth., Disp: , Rfl:    clotrimazole-betamethasone (LOTRISONE) cream, Apply 1 application topically 2 (two) times daily. x7-10, Disp: 30 g, Rfl: 0   escitalopram (LEXAPRO) 10 MG tablet, Take 1 tablet (10 mg total) by mouth daily., Disp: 90 tablet, Rfl: 3   fluconazole (DIFLUCAN) 100 MG tablet, Take two with first dose. Then starting the next day take one daily until all are taken., Disp: 15 tablet, Rfl: 0   fluticasone (FLONASE) 50 MCG/ACT nasal spray, Place 2 sprays into both nostrils daily., Disp: 16 g, Rfl: 6   fluticasone (FLOVENT HFA) 44 MCG/ACT inhaler, Inhale 2 puffs into the lungs 2 (two) times daily., Disp: 1 each, Rfl: 12   levETIRAcetam (KEPPRA) 750 MG tablet, Take 1,500 mg by mouth in the morning and at bedtime., Disp: , Rfl:    levothyroxine (SYNTHROID) 112 MCG tablet, Take 1 tablet by mouth daily., Disp: , Rfl:    naproxen (NAPROSYN) 500 MG tablet, TAKE 1 TABLET (500 MG TOTAL) BY MOUTH 2 (TWO) TIMES DAILY AS NEEDED FOR MODERATE PAIN., Disp: 60 tablet, Rfl: 2   Spacer/Aero-Holding Chambers (AEROCHAMBER PLUS) inhaler, Use as instructed, Disp: 1 each,  Rfl: 2   Testosterone 20.25 MG/ACT (1.62%) GEL, ONE PUM ON EACH SHOULDER EVERY MORNIG, Disp: 75 g, Rfl: 1   thiamine 100 MG tablet, Take by mouth., Disp: , Rfl:    VENTOLIN HFA 108 (90 Base) MCG/ACT inhaler, TAKE 2 PUFFS BY MOUTH EVERY 6 HOURS AS NEEDED FOR WHEEZE OR SHORTNESS OF BREATH, Disp: 18 each, Rfl: 1 Social History   Socioeconomic History   Marital status: Single    Spouse name: Not on file   Number of children: 0   Years of education: 7th grade   Highest education level: 7th grade  Occupational History   Occupation: disabled  Tobacco Use   Smoking status: Every Day    Packs/day: 0.25    Years: 6.00    Pack years: 1.50     Types: Cigarettes    Start date: 11/25/2012   Smokeless tobacco: Never  Vaping Use   Vaping Use: Never used  Substance and Sexual Activity   Alcohol use: No   Drug use: No   Sexual activity: Not Currently  Other Topics Concern   Not on file  Social History Narrative   Not on file   Social Determinants of Health   Financial Resource Strain: Not on file  Food Insecurity: Not on file  Transportation Needs: Not on file  Physical Activity: Not on file  Stress: Not on file  Social Connections: Not on file  Intimate Partner Violence: Not on file   History reviewed. No pertinent family history.  Objective: Office vital signs reviewed. BP 119/73   Pulse (!) 110   Temp 98.6 F (37 C)   Ht 5\' 10"  (1.778 m)   Wt 137 lb 3.2 oz (62.2 kg)   SpO2 96%   BMI 19.69 kg/m   Physical Examination:  General: Awake, alert, chronically ill-appearing male.  Moon facies noted. No acute distress HEENT: Sclera anicteric GI: soft, mild epigastric tenderness present.  No rebound or guarding. non-distended, bowel sounds present x4, no hepatomegaly, no splenomegaly, no masses MSK: Arrives in wheelchair.  Tone is fair.  Moves all extremities.  Assessment/ Plan: 25 y.o. male   Gastroesophageal reflux disease, unspecified whether esophagitis present - Plan: omeprazole (PRILOSEC) 20 MG capsule, Ambulatory referral to Gastroenterology  Epigastric abdominal tenderness with rebound tenderness - Plan: omeprazole (PRILOSEC) 20 MG capsule, Ambulatory referral to Gastroenterology  Weight loss  Very suggestive of gastritis versus peptic ulcer.  I agree that gastroenterology eval may be beneficial.  Would consider EGD.  In the interim start him on a PPI, particularly as long as he is taking oral steroids and NSAIDs for various arthralgias.  We discussed trying to avoid high acid foods.  Nothing to suggest GI bleeding at this time or anything to suggest perforation.  A handout was provided today.  He will  follow-up as needed or in 3 months.  Additionally, weight loss was determined to be erroneous and he was around his baseline weight today.  Continue adequate nutrition as he does appear to have some level of general malnutrition/ poor muscle tone.  No orders of the defined types were placed in this encounter.  No orders of the defined types were placed in this encounter.    [name], DO [place] [phone]

## 2021-05-24 NOTE — Progress Notes (Signed)
Family is concerned because patient has been having trouble keeping food down--gagging when trying to eat. They were concerned that he had lost significant weight, but today the patient weighed 137.2 lbs, a 1.2 lb increase from his last office visit.   He recently had a swallowing study done at an ears/nose/throat office, that found no upper esophageal issues. The patient's family would like a referral to GI specialists at [facility] today.  He would also like medication to help with the acid reflux/food regurgitation. Patient reports that he feels hungry, but just cannot eat. He has been drinking a lot of soda, which may be contributing to his reflux issues.

## 2021-05-24 NOTE — Patient Instructions (Signed)
Peptic Ulcer Eating Plan Peptic ulcers are sores that form on the lining of the stomach, esophagus, or the part of the small intestine that is attached to the stomach (duodenum). These sores are also called stomach ulcers. When ulcers develop, they can cause a burning feeling in the stomach as well as bloating, nausea, vomiting,and poor appetite. If you have a history of peptic ulcers, it is important to keep track of whatfoods and drinks cause symptoms. What are tips for following this plan?  Eat a healthy, well-balanced diet. This includes: Fresh fruits and vegetables. Eat a variety of colors of fruits and vegetables. Whole grains. Try to make sure at least half of the grains you eat each day are whole grains. Low-fat dairy. Lean meat, fish, poultry, eggs, beans, and nuts. Healthy fats, such as olive oil, grapeseed oil, or canola oil. Try to eat less than 8 teaspoons of fats and oils each day. Avoid foods that cause irritation or pain. These may be different for different people. Keep a food diary to identify foods that cause symptoms. Avoid processed foods that have added salt and sugar. Avoid drinking alcohol. Avoid drinks with caffeine, such as cola, black tea, energy drinks, and coffee. Recommended foods Grains Whole grains. Vegetables All fresh or frozen vegetables. Low-sodium canned vegetables. Fruits All fresh, frozen, or dried fruit. Fruit canned in juice. Meats and other protein foods Lean cuts of meat. Skinless poultry. Fresh or canned fish. Eggs. Tofu. Nuts and nut butter. Dried beans. Low-sodium canned beans. Dairy Low-fat or nonfat (skim) milk. Nonfat or low-fat yogurt. Nonfat or low-fat cheese. Beverages Water. Soy or nut milks. Caffeine-free soft drinks. Herbal tea. Fats and oils Olive oil. Canola oil. Grapeseed oil. Sunflower oil. Seasoning and other foods Low-fat salad dressing. Ketchup. Low-fat mayonnaise. All spices except pepper. Low-sodium seasoning mixes. The  items listed above may not be a complete list of foods and beverages youcan eat. Contact a dietitian for more information. Foods to avoid Meats and other protein foods Fatty meats. Fried meats. Any meat that causes symptoms. Dairy Whole milk. Ice cream. Cream. Chocolate milk. Beverages Alcohol. Coffee. Cola and energy drinks. Black or green tea. Cocoa. Fats and oils Butter. Lard. Ghee. Seasoning and other foods Pepper. Hot sauce. Any seasonings or condiments that cause symptoms. The items listed above may not be a complete list of foods and beverages thatyou should avoid. Contact a dietitian for more information. Summary Peptic ulcers can cause burning in the stomach as well as bloating, nausea, vomiting, and poor appetite. You may be able to limit symptoms by avoiding foods that make you feel worse. Work with your dietitian or health care provider to identify foods that cause symptoms. This may include caffeinated drinks, alcohol, or pepper. This information is not intended to replace advice given to you by your health care provider. Make sure you discuss any questions you have with your healthcare provider. Document Revised: 09/30/2020 Document Reviewed: 09/30/2020 Elsevier Patient Education  2022 [employer].

## 2021-05-25 ENCOUNTER — Telehealth: Payer: Self-pay | Admitting: Family Medicine

## 2021-05-25 NOTE — Telephone Encounter (Signed)
Pts aunt to stating that she forgot to ask Dr Lajuana Ripple for a referral for pt to see a cardiologist since pts heart rate is always up.  Please advise and call aunt [name]) [phone]

## 2021-05-27 ENCOUNTER — Other Ambulatory Visit: Payer: Self-pay | Admitting: Family Medicine

## 2021-05-27 ENCOUNTER — Telehealth: Payer: Self-pay | Admitting: Family Medicine

## 2021-05-27 DIAGNOSIS — J690 Pneumonitis due to inhalation of food and vomit: Secondary | ICD-10-CM | POA: Diagnosis not present

## 2021-05-27 DIAGNOSIS — E2749 Other adrenocortical insufficiency: Secondary | ICD-10-CM | POA: Diagnosis not present

## 2021-05-27 DIAGNOSIS — G40219 Localization-related (focal) (partial) symptomatic epilepsy and epileptic syndromes with complex partial seizures, intractable, without status epilepticus: Secondary | ICD-10-CM | POA: Diagnosis not present

## 2021-05-27 DIAGNOSIS — D630 Anemia in neoplastic disease: Secondary | ICD-10-CM | POA: Diagnosis not present

## 2021-05-27 DIAGNOSIS — J9601 Acute respiratory failure with hypoxia: Secondary | ICD-10-CM | POA: Diagnosis not present

## 2021-05-27 DIAGNOSIS — C716 Malignant neoplasm of cerebellum: Secondary | ICD-10-CM | POA: Diagnosis not present

## 2021-05-27 DIAGNOSIS — R000 Tachycardia, unspecified: Secondary | ICD-10-CM

## 2021-05-27 NOTE — Telephone Encounter (Signed)
Patient's aunt, [name] would like for patient to have a referral to see a Cardiologist.

## 2021-05-30 DIAGNOSIS — M8700 Idiopathic aseptic necrosis of unspecified bone: Secondary | ICD-10-CM | POA: Insufficient documentation

## 2021-05-30 DIAGNOSIS — E46 Unspecified protein-calorie malnutrition: Secondary | ICD-10-CM | POA: Diagnosis not present

## 2021-05-30 DIAGNOSIS — M1712 Unilateral primary osteoarthritis, left knee: Secondary | ICD-10-CM | POA: Diagnosis not present

## 2021-05-30 DIAGNOSIS — M87022 Idiopathic aseptic necrosis of left humerus: Secondary | ICD-10-CM | POA: Diagnosis not present

## 2021-05-30 DIAGNOSIS — M87822 Other osteonecrosis, left humerus: Secondary | ICD-10-CM | POA: Diagnosis not present

## 2021-05-30 DIAGNOSIS — Z885 Allergy status to narcotic agent status: Secondary | ICD-10-CM | POA: Diagnosis not present

## 2021-06-01 ENCOUNTER — Other Ambulatory Visit: Payer: Self-pay

## 2021-06-01 ENCOUNTER — Encounter: Payer: Self-pay | Admitting: Family Medicine

## 2021-06-01 ENCOUNTER — Ambulatory Visit (INDEPENDENT_AMBULATORY_CARE_PROVIDER_SITE_OTHER): Payer: Medicare Other | Admitting: Family Medicine

## 2021-06-01 VITALS — BP 94/62 | HR 113 | Temp 98.1°F | Ht 70.0 in | Wt 137.0 lb

## 2021-06-01 DIAGNOSIS — H6001 Abscess of right external ear: Secondary | ICD-10-CM

## 2021-06-01 MED ORDER — DAPSONE 100 MG PO TABS: 100.0000 mg | ORAL_TABLET | Freq: Every day | ORAL | 0 refills | Status: DC

## 2021-06-01 MED ORDER — AMOXICILLIN 875 MG PO TABS: 875.0000 mg | ORAL_TABLET | Freq: Two times a day (BID) | ORAL | 0 refills | Status: AC

## 2021-06-01 NOTE — Progress Notes (Signed)
Chief Complaint  Patient presents with   Facial Swelling    Right ear swelling    HPI  Patient presents today for onset 5 days ago of pain at the external right ear.  It was described as a burning.  He felt that he had been bitten by something during the night the night before.  It did not start swelling until today.  Is exquisitely painful now.  PMH: Smoking status noted ROS: Per HPI  Objective: BP 94/62   Pulse (!) 113   Temp 98.1 F (36.7 C)   Ht 5\' 10"  (1.778 m)   Wt 137 lb (62.1 kg)   SpO2 96%   BMI 19.66 kg/m  Gen: NAD, alert, cooperative with exam.  HEENT: NCAT, EOMI, PERRL the right upper ear is exquisitely tender to touch.  Palpation is difficult for him to tolerate.  However there is a bruised appearance with edema and fluctuance noted at the upper lateral aspect of the right pinna. CV: RRR, good S1/S2, no murmur Resp: CTABL, no wheezes, non-labored Neuro: Alert and oriented, No gross deficits Location: right upper external ear.  Informed Consent: Discussed risks (permanent scarring, light or dark discoloration, infection, pain, bleeding, bruising, redness, damage to adjacent structures, and recurrence of the lesion) and benefits of the procedure, as well as the alternatives.  Informed consent was obtained.  Preparation: The area was prepped with betadine  Anesthesia: Lidocaine 2% without epinephrine  Procedure Details: An incision was made overlying the lesion. The lesion drained straw - colored, serous fluid 2-3 ml  Antibiotic ointment and a sterile pressure dressing were applied. The patient tolerated procedure well.  Total number of lesions drained: 1  Plan: The patient was instructed on post-op care. Recommend OTC analgesia as needed for pain.  Assessment and plan:  1. Abscess of right external ear     Meds ordered this encounter  Medications   amoxicillin (AMOXIL) 875 MG tablet    Sig: Take 1 tablet (875 mg total) by mouth 2 (two) times daily for 10  days.    Dispense:  20 tablet    Refill:  0   dapsone 100 MG tablet    Sig: Take 1 tablet (100 mg total) by mouth daily.    Dispense:  10 tablet    Refill:  0     No orders of the defined types were placed in this encounter.   Follow up as needed.  Claretta Fraise, MD

## 2021-06-02 DIAGNOSIS — G40219 Localization-related (focal) (partial) symptomatic epilepsy and epileptic syndromes with complex partial seizures, intractable, without status epilepticus: Secondary | ICD-10-CM | POA: Diagnosis not present

## 2021-06-02 DIAGNOSIS — D630 Anemia in neoplastic disease: Secondary | ICD-10-CM | POA: Diagnosis not present

## 2021-06-02 DIAGNOSIS — E2749 Other adrenocortical insufficiency: Secondary | ICD-10-CM | POA: Diagnosis not present

## 2021-06-02 DIAGNOSIS — C716 Malignant neoplasm of cerebellum: Secondary | ICD-10-CM | POA: Diagnosis not present

## 2021-06-02 DIAGNOSIS — J9601 Acute respiratory failure with hypoxia: Secondary | ICD-10-CM | POA: Diagnosis not present

## 2021-06-02 DIAGNOSIS — J690 Pneumonitis due to inhalation of food and vomit: Secondary | ICD-10-CM | POA: Diagnosis not present

## 2021-06-03 ENCOUNTER — Telehealth: Payer: Self-pay | Admitting: Family Medicine

## 2021-06-03 DIAGNOSIS — C716 Malignant neoplasm of cerebellum: Secondary | ICD-10-CM | POA: Diagnosis not present

## 2021-06-03 DIAGNOSIS — G40219 Localization-related (focal) (partial) symptomatic epilepsy and epileptic syndromes with complex partial seizures, intractable, without status epilepticus: Secondary | ICD-10-CM | POA: Diagnosis not present

## 2021-06-03 DIAGNOSIS — J690 Pneumonitis due to inhalation of food and vomit: Secondary | ICD-10-CM | POA: Diagnosis not present

## 2021-06-03 DIAGNOSIS — D630 Anemia in neoplastic disease: Secondary | ICD-10-CM | POA: Diagnosis not present

## 2021-06-03 DIAGNOSIS — J9601 Acute respiratory failure with hypoxia: Secondary | ICD-10-CM | POA: Diagnosis not present

## 2021-06-03 DIAGNOSIS — E2749 Other adrenocortical insufficiency: Secondary | ICD-10-CM | POA: Diagnosis not present

## 2021-06-03 NOTE — Telephone Encounter (Signed)
Call received through after hours call service to report a heart rate of 88. Patient feels fine. Arbie Cookey, ST, reports she has to call because it is outside the given parameters of 95-101. I did clarify the lower parameter of 95 (multiple times). She is not able to tell me who gave the parameters. Asked to monitor and let us know or seek immediate medical attention for chest pain. Will forward to PCP for information purposes.

## 2021-06-06 DIAGNOSIS — J690 Pneumonitis due to inhalation of food and vomit: Secondary | ICD-10-CM | POA: Diagnosis not present

## 2021-06-06 DIAGNOSIS — C716 Malignant neoplasm of cerebellum: Secondary | ICD-10-CM | POA: Diagnosis not present

## 2021-06-06 DIAGNOSIS — E2749 Other adrenocortical insufficiency: Secondary | ICD-10-CM | POA: Diagnosis not present

## 2021-06-06 DIAGNOSIS — J9601 Acute respiratory failure with hypoxia: Secondary | ICD-10-CM | POA: Diagnosis not present

## 2021-06-06 DIAGNOSIS — D630 Anemia in neoplastic disease: Secondary | ICD-10-CM | POA: Diagnosis not present

## 2021-06-06 DIAGNOSIS — G40219 Localization-related (focal) (partial) symptomatic epilepsy and epileptic syndromes with complex partial seizures, intractable, without status epilepticus: Secondary | ICD-10-CM | POA: Diagnosis not present

## 2021-06-07 ENCOUNTER — Telehealth: Payer: Self-pay | Admitting: Family Medicine

## 2021-06-07 DIAGNOSIS — M87052 Idiopathic aseptic necrosis of left femur: Secondary | ICD-10-CM

## 2021-06-07 DIAGNOSIS — Z87898 Personal history of other specified conditions: Secondary | ICD-10-CM

## 2021-06-07 DIAGNOSIS — G43001 Migraine without aura, not intractable, with status migrainosus: Secondary | ICD-10-CM | POA: Diagnosis not present

## 2021-06-07 DIAGNOSIS — Z961 Presence of intraocular lens: Secondary | ICD-10-CM | POA: Diagnosis not present

## 2021-06-07 DIAGNOSIS — Z7409 Other reduced mobility: Secondary | ICD-10-CM

## 2021-06-07 DIAGNOSIS — H268 Other specified cataract: Secondary | ICD-10-CM | POA: Diagnosis not present

## 2021-06-07 NOTE — Telephone Encounter (Signed)
Pt's aunt informed. Aware to call the office back if an appt has not been scheduled within a week.

## 2021-06-07 NOTE — Telephone Encounter (Signed)
done

## 2021-06-07 NOTE — Telephone Encounter (Signed)
Can we please change parameters with home health and lower the bottom parameter to 70?

## 2021-06-09 DIAGNOSIS — S2231XA Fracture of one rib, right side, initial encounter for closed fracture: Secondary | ICD-10-CM | POA: Diagnosis not present

## 2021-06-09 DIAGNOSIS — M2570 Osteophyte, unspecified joint: Secondary | ICD-10-CM | POA: Diagnosis not present

## 2021-06-09 DIAGNOSIS — R062 Wheezing: Secondary | ICD-10-CM | POA: Diagnosis not present

## 2021-06-09 DIAGNOSIS — R918 Other nonspecific abnormal finding of lung field: Secondary | ICD-10-CM | POA: Diagnosis not present

## 2021-06-09 DIAGNOSIS — F17210 Nicotine dependence, cigarettes, uncomplicated: Secondary | ICD-10-CM | POA: Diagnosis not present

## 2021-06-09 DIAGNOSIS — J9601 Acute respiratory failure with hypoxia: Secondary | ICD-10-CM | POA: Diagnosis not present

## 2021-06-09 NOTE — Telephone Encounter (Signed)
Spoke with intake nurse to drop lower parameter on HR to 70.

## 2021-06-10 ENCOUNTER — Encounter: Payer: Self-pay | Admitting: Nurse Practitioner

## 2021-06-10 ENCOUNTER — Other Ambulatory Visit: Payer: Self-pay

## 2021-06-10 ENCOUNTER — Other Ambulatory Visit: Payer: Self-pay | Admitting: Family Medicine

## 2021-06-10 ENCOUNTER — Ambulatory Visit: Payer: Medicare Other

## 2021-06-10 ENCOUNTER — Ambulatory Visit (INDEPENDENT_AMBULATORY_CARE_PROVIDER_SITE_OTHER): Payer: Medicare Other | Admitting: Nurse Practitioner

## 2021-06-10 VITALS — BP 132/86 | HR 88 | Temp 97.7°F

## 2021-06-10 DIAGNOSIS — R062 Wheezing: Secondary | ICD-10-CM

## 2021-06-10 DIAGNOSIS — H6001 Abscess of right external ear: Secondary | ICD-10-CM | POA: Diagnosis not present

## 2021-06-10 DIAGNOSIS — T8149XA Infection following a procedure, other surgical site, initial encounter: Secondary | ICD-10-CM | POA: Diagnosis not present

## 2021-06-10 DIAGNOSIS — C716 Malignant neoplasm of cerebellum: Secondary | ICD-10-CM | POA: Diagnosis not present

## 2021-06-10 DIAGNOSIS — D630 Anemia in neoplastic disease: Secondary | ICD-10-CM | POA: Diagnosis not present

## 2021-06-10 DIAGNOSIS — J690 Pneumonitis due to inhalation of food and vomit: Secondary | ICD-10-CM | POA: Diagnosis not present

## 2021-06-10 DIAGNOSIS — J9801 Acute bronchospasm: Secondary | ICD-10-CM

## 2021-06-10 DIAGNOSIS — G40219 Localization-related (focal) (partial) symptomatic epilepsy and epileptic syndromes with complex partial seizures, intractable, without status epilepticus: Secondary | ICD-10-CM | POA: Diagnosis not present

## 2021-06-10 DIAGNOSIS — E2749 Other adrenocortical insufficiency: Secondary | ICD-10-CM | POA: Diagnosis not present

## 2021-06-10 DIAGNOSIS — J9601 Acute respiratory failure with hypoxia: Secondary | ICD-10-CM | POA: Diagnosis not present

## 2021-06-10 NOTE — Patient Instructions (Signed)
Skin Abscess  A skin abscess is an infected area on or under your skin that contains a collection of pus and other material. An abscess may also be called a furuncle,carbuncle, or boil. An abscess can occur in or on almost any part of your body. Some abscesses break open (rupture) on their own. Most continue to get worse unless they are treated. The infection can spread deeper into the body and eventually into your blood, whichcan make you feel ill. Treatment usually involves draining the abscess. What are the causes? An abscess occurs when germs, like bacteria, pass through your skin and cause an infection. This may be caused by: A scrape or cut on your skin. A puncture wound through your skin, including a needle injection or insect bite. Blocked oil or sweat glands. Blocked and infected hair follicles. A cyst that forms beneath your skin (sebaceous cyst) and becomes infected. What increases the risk? This condition is more likely to develop in people who: Have a weak body defense system (immune system). Have diabetes. Have dry and irritated skin. Get frequent injections or use illegal IV drugs. Have a foreign body in a wound, such as a splinter. Have problems with their lymph system or veins. What are the signs or symptoms? Symptoms of this condition include: A painful, firm bump under the skin. A bump with pus at the top. This may break through the skin and drain. Other symptoms include: Redness surrounding the abscess site. Warmth. Swelling of the lymph nodes (glands) near the abscess. Tenderness. A sore on the skin. How is this diagnosed? This condition may be diagnosed based on: A physical exam. Your medical history. A sample of pus. This may be used to find out what is causing the infection. Blood tests. Imaging tests, such as an ultrasound, CT scan, or MRI. How is this treated? A small abscess that drains on its own may not need treatment. Treatment for larger abscesses  may include: Moist heat or heat pack applied to the area several times a day. A procedure to drain the abscess (incision and drainage). Antibiotic medicines. For a severe abscess, you may first get antibiotics through an IV and then change to antibiotics by mouth. Follow these instructions at home: Medicines  Take over-the-counter and prescription medicines only as told by your health care provider. If you were prescribed an antibiotic medicine, take it as told by your health care provider. Do not stop taking the antibiotic even if you start to feel better.  Abscess care  If you have an abscess that has not drained, apply heat to the affected area. Use the heat source that your health care provider recommends, such as a moist heat pack or a heating pad. Place a towel between your skin and the heat source. Leave the heat on for 20-30 minutes. Remove the heat if your skin turns bright red. This is especially important if you are unable to feel pain, heat, or cold. You may have a greater risk of getting burned. Follow instructions from your health care provider about how to take care of your abscess. Make sure you: Cover the abscess with a bandage (dressing). Change your dressing or gauze as told by your health care provider. Wash your hands with soap and water before you change the dressing or gauze. If soap and water are not available, use hand sanitizer. Check your abscess every day for signs of a worsening infection. Check for: More redness, swelling, or pain. More fluid or blood. Warmth. More   pus or a bad smell.  General instructions To avoid spreading the infection: Do not share personal care items, towels, or hot tubs with others. Avoid making skin contact with other people. Keep all follow-up visits as told by your health care provider. This is important. Contact a health care provider if you have: More redness, swelling, or pain around your abscess. More fluid or blood coming  from your abscess. Warm skin around your abscess. More pus or a bad smell coming from your abscess. A fever. Muscle aches. Chills or a general ill feeling. Get help right away if you: Have severe pain. See red streaks on your skin spreading away from the abscess. Summary A skin abscess is an infected area on or under your skin that contains a collection of pus and other material. A small abscess that drains on its own may not need treatment. Treatment for larger abscesses may include having a procedure to drain the abscess and taking an antibiotic. This information is not intended to replace advice given to you by your health care provider. Make sure you discuss any questions you have with your healthcare provider. Document Revised: 02/20/2019 Document Reviewed: 12/13/2017 Elsevier Patient Education  2022 Elsevier Inc.  

## 2021-06-10 NOTE — Progress Notes (Signed)
Acute Office Visit  Subjective:    Patient ID: James Hoffman, male    DOB: [birth year], 25 y.o.   MRN: [id number]  Chief Complaint  Patient presents with   Abscess     HPI Patient is in today for hematoma of the right ear.  Patient was in clinic last week with his aunt reporting an insect bite to the right ear, he reports last drained clear fluid and patient was put on antibiotic [amoxicillin for 10 days.]  2 days ago patient's ear became worse, red, warm to touch and hematoma like appearance.  No fever, nausea or vomiting associated with current symptoms.  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 No pertinent family history.  Social History   Socioeconomic History   Marital status: Single    Spouse name: Not on file   Number of children: 0   Years of education: 7th grade   Highest education level: 7th grade  Occupational History   Occupation: disabled  Tobacco Use   Smoking status: Every Day    Packs/day: 0.25    Years: 6.00    Pack years: 1.50    Types: Cigarettes    Start date: 11/25/2012   Smokeless tobacco: Never  Vaping Use   Vaping Use: Never used  Substance and Sexual Activity   Alcohol use: No   Drug use: No   Sexual activity: Not Currently  Other Topics Concern   Not on file  Social History Narrative   Not on file   Social Determinants of Health   Financial Resource Strain: Not on file  Food Insecurity: Not on file  Transportation Needs: Not on file  Physical Activity: Not on file  Stress: Not on file  Social Connections: Not on file  Intimate Partner Violence: Not on file    Outpatient Medications Prior to Visit   Medication Sig Dispense Refill   amoxicillin (AMOXIL) 875 MG tablet Take 1 tablet (875 mg total) by mouth 2 (two) times daily for 10 days. 20 tablet 0   atorvastatin (LIPITOR) 40 MG tablet      benzonatate (TESSALON) 200 MG capsule Take 1 capsule (200 mg total) by mouth 3 (three) times daily as needed. 30 capsule 1   busPIRone (BUSPAR) 5 MG tablet TAKE 1 TABLET (5 MG TOTAL) BY MOUTH 3 (THREE) TIMES DAILY. FOR ANXIETY 270 tablet 0   cetirizine (ZYRTEC) 10 MG tablet Take 1 tablet (10 mg total) by mouth daily. 30 tablet 11   Cholecalciferol 50 MCG (2000 UT) CAPS Take by mouth.     clotrimazole-betamethasone (LOTRISONE) cream Apply 1 application topically 2 (two) times daily. x7-10 30 g 0   dapsone 100 MG tablet Take 1 tablet (100 mg total) by mouth daily. 10 tablet 0   escitalopram (LEXAPRO) 10 MG tablet Take 1 tablet (10 mg total) by mouth daily. 90 tablet 3   fluconazole (DIFLUCAN) 100 MG tablet Take two with first dose. Then starting the next day take one daily until all are taken. 15 tablet 0   fluticasone (FLONASE) 50  MCG/ACT nasal spray Place 2 sprays into both nostrils daily. 16 g 6   fluticasone (FLOVENT HFA) 44 MCG/ACT inhaler Inhale 2 puffs into the lungs 2 (two) times daily. 1 each 12   levETIRAcetam (KEPPRA) 750 MG tablet Take 1,500 mg by mouth in the morning and at bedtime.     levothyroxine (SYNTHROID) 112 MCG tablet Take 1 tablet by mouth daily.     naproxen (NAPROSYN) 500 MG tablet TAKE 1 TABLET (500 MG TOTAL) BY MOUTH 2 (TWO) TIMES DAILY AS NEEDED FOR MODERATE PAIN. 60 tablet 2   omeprazole (PRILOSEC) 20 MG capsule Take 1 capsule (20 mg total) by mouth daily. At lunch time. 30 capsule 6   Spacer/Aero-Holding Chambers (AEROCHAMBER PLUS) inhaler Use as instructed 1 each 2   Testosterone 20.25 MG/ACT (1.62%) GEL ONE PUM ON EACH SHOULDER EVERY MORNIG 75 g 1   thiamine 100 MG tablet Take by mouth.     VENTOLIN HFA 108 (90 Base) MCG/ACT inhaler TAKE 2 PUFFS BY MOUTH EVERY 6 HOURS AS  NEEDED FOR WHEEZE OR SHORTNESS OF BREATH 18 each 1   No facility-administered medications prior to visit.    Allergies  Allergen Reactions   Tramadol Anaphylaxis   Morphine Nausea And Vomiting    According to mother the patient is intolerant of morphine; causes nausea and vomiting.  Has had none since he was 25yo.    Review of Systems  Constitutional: Negative.   HENT:  Positive for ear pain.   Respiratory: Negative.    Gastrointestinal: Negative.   Skin:  Negative for rash.  All other systems reviewed and are negative.     Objective:    Physical Exam Vitals reviewed.  Constitutional:      Appearance: Normal appearance.  HENT:     Head: Normocephalic.     Right Ear: Hearing normal. Swelling and tenderness present.     Ears:      Comments: Right hematoma    Nose: Nose normal.  Eyes:     Conjunctiva/sclera: Conjunctivae normal.  Cardiovascular:     Rate and Rhythm: Normal rate and regular rhythm.     Pulses: Normal pulses.     Heart sounds: Normal heart sounds.  Pulmonary:     Effort: Pulmonary effort is normal.     Breath sounds: Normal breath sounds.  Abdominal:     General: Bowel sounds are normal.  Neurological:     Mental Status: He is alert.    BP 132/86   Pulse 88   Temp 97.7 F (36.5 C) (Temporal)   SpO2 96%  Wt Readings from Last 3 Encounters:  06/01/21 137 lb (62.1 kg)  05/24/21 137 lb 3.2 oz (62.2 kg)  01/20/21 141 lb 9.6 oz (64.2 kg)    Health Maintenance Due  Topic Date Due   Pneumococcal Vaccine 27-27 Years old (1 - PCV) Never done   HPV VACCINES (1 - Male 2-dose series) Never done   COVID-19 Vaccine (4 - Booster for Moderna series) 02/21/2021       Topic Date Due   HPV VACCINES (1 - Male 2-dose series) Never done     Lab Results  Component Value Date   TSH 0.074 (L) 01/27/2020   Lab Results  Component Value Date   WBC 24.1 (HH) 01/05/2021   HGB 14.0 01/05/2021   HCT 39.6 01/05/2021   MCV 92 01/05/2021   PLT 377 01/05/2021    Lab Results  Component Value Date   NA 130 (L) 01/05/2021  K 3.9 01/05/2021   CO2 25 01/05/2021   GLUCOSE 96 01/05/2021   BUN 4 (L) 01/05/2021   CREATININE 0.74 (L) 01/05/2021   BILITOT 0.2 01/05/2021   ALKPHOS 84 01/05/2021   AST 23 01/05/2021   ALT 30 01/05/2021   PROT 5.4 (L) 01/05/2021   ALBUMIN 3.8 (L) 01/05/2021   CALCIUM 9.1 01/05/2021   ANIONGAP 6 09/10/2015   No results found for: CHOL No results found for: HDL No results found for: LDLCALC No results found for: TRIG No results found for: CHOLHDL No results found for: HGBA1C     Assessment & Plan:   Problem List Items Addressed This Visit       Other   Abscess after procedure - Primary    Worsening abscess after patient's ear was lanced and drained.  Symptoms not well controlled.  Advised patient's Aunt  to transfer patient to the hospital for reassessment of right ear hematoma, and possible IV antibiotic.         No orders of the defined types were placed in this encounter.    Ivy Lynn, NP

## 2021-06-11 DIAGNOSIS — X58XXXA Exposure to other specified factors, initial encounter: Secondary | ICD-10-CM | POA: Diagnosis not present

## 2021-06-11 DIAGNOSIS — S00431A Contusion of right ear, initial encounter: Secondary | ICD-10-CM | POA: Diagnosis not present

## 2021-06-11 DIAGNOSIS — F17210 Nicotine dependence, cigarettes, uncomplicated: Secondary | ICD-10-CM | POA: Diagnosis not present

## 2021-06-11 DIAGNOSIS — I10 Essential (primary) hypertension: Secondary | ICD-10-CM | POA: Diagnosis not present

## 2021-06-12 DIAGNOSIS — T8149XA Infection following a procedure, other surgical site, initial encounter: Secondary | ICD-10-CM | POA: Insufficient documentation

## 2021-06-12 NOTE — Assessment & Plan Note (Signed)
Worsening abscess after patient's ear was lanced and drained.  Symptoms not well controlled.  Advised patient's Aunt  to transfer patient to the hospital for reassessment of right ear hematoma, and possible IV antibiotic.

## 2021-06-13 ENCOUNTER — Ambulatory Visit: Payer: Medicare Other | Attending: Family Medicine

## 2021-06-13 ENCOUNTER — Ambulatory Visit (INDEPENDENT_AMBULATORY_CARE_PROVIDER_SITE_OTHER): Payer: Medicare Other

## 2021-06-13 ENCOUNTER — Other Ambulatory Visit: Payer: Self-pay

## 2021-06-13 VITALS — BP 98/70 | HR 107

## 2021-06-13 DIAGNOSIS — M1612 Unilateral primary osteoarthritis, left hip: Secondary | ICD-10-CM

## 2021-06-13 DIAGNOSIS — M25562 Pain in left knee: Secondary | ICD-10-CM | POA: Diagnosis not present

## 2021-06-13 DIAGNOSIS — D72829 Elevated white blood cell count, unspecified: Secondary | ICD-10-CM | POA: Diagnosis not present

## 2021-06-13 DIAGNOSIS — J690 Pneumonitis due to inhalation of food and vomit: Secondary | ICD-10-CM | POA: Diagnosis not present

## 2021-06-13 DIAGNOSIS — M6281 Muscle weakness (generalized): Secondary | ICD-10-CM | POA: Diagnosis not present

## 2021-06-13 DIAGNOSIS — E2749 Other adrenocortical insufficiency: Secondary | ICD-10-CM | POA: Diagnosis not present

## 2021-06-13 DIAGNOSIS — D690 Allergic purpura: Secondary | ICD-10-CM | POA: Diagnosis not present

## 2021-06-13 DIAGNOSIS — G40219 Localization-related (focal) (partial) symptomatic epilepsy and epileptic syndromes with complex partial seizures, intractable, without status epilepticus: Secondary | ICD-10-CM

## 2021-06-13 DIAGNOSIS — R296 Repeated falls: Secondary | ICD-10-CM | POA: Diagnosis not present

## 2021-06-13 DIAGNOSIS — R2681 Unsteadiness on feet: Secondary | ICD-10-CM | POA: Insufficient documentation

## 2021-06-13 DIAGNOSIS — M810 Age-related osteoporosis without current pathological fracture: Secondary | ICD-10-CM

## 2021-06-13 DIAGNOSIS — D630 Anemia in neoplastic disease: Secondary | ICD-10-CM

## 2021-06-13 DIAGNOSIS — M222X9 Patellofemoral disorders, unspecified knee: Secondary | ICD-10-CM | POA: Diagnosis not present

## 2021-06-13 DIAGNOSIS — J9601 Acute respiratory failure with hypoxia: Secondary | ICD-10-CM | POA: Diagnosis not present

## 2021-06-13 DIAGNOSIS — C716 Malignant neoplasm of cerebellum: Secondary | ICD-10-CM | POA: Diagnosis not present

## 2021-06-13 DIAGNOSIS — E230 Hypopituitarism: Secondary | ICD-10-CM

## 2021-06-14 DIAGNOSIS — Z7982 Long term (current) use of aspirin: Secondary | ICD-10-CM | POA: Diagnosis not present

## 2021-06-14 DIAGNOSIS — S00431A Contusion of right ear, initial encounter: Secondary | ICD-10-CM | POA: Diagnosis not present

## 2021-06-14 DIAGNOSIS — H6123 Impacted cerumen, bilateral: Secondary | ICD-10-CM | POA: Diagnosis not present

## 2021-06-14 DIAGNOSIS — F17210 Nicotine dependence, cigarettes, uncomplicated: Secondary | ICD-10-CM | POA: Diagnosis not present

## 2021-06-14 NOTE — Therapy (Signed)
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 He was provided injections for the knee last month.  He tends to have impulsive behaviors and wll fall getting in abd out of the car. He was also having pain in the L shoulder and anticipates having a shoulder replacement however he needs to be able to walk with a walker and develop muscle mass. He is not allowed to stand by himself - if he wants to get to where he wants to go, he will scoot on the floor. He can somewhat dress himself  but gets distracted. She denoes any complaints of numbness and tingling.    Patient is accompained by: Family member   Aunt   Pertinent History History of cancer and chemo tx / hx of brain stem tumor    How long can you walk comfortably? ataxic - not independenet    Currently in Pain? Yes    Pain Score 6     Pain Location Knee    Pain Orientation Left    Pain Descriptors / Indicators Aching;Sore    Pain Type Chronic pain    Pain Onset More than a month ago    Pain Frequency Constant                06/13/21  0001  Bed Mobility  Bed Mobility Rolling Right;Rolling Left;Right Sidelying to Sit;Left Sidelying to Sit;Supine to Sit;Sitting - Scoot to Edge of Bed  Rolling Right Independent  Rolling Left Supervision/Verbal cueing (poor eyesite on L)  Right Sidelying to Sit Supervision/Verbal cueing  Left Sidelying to Sit Contact Guard/Touching assist  Supine to Sit Contact Guard/Touching assist  Sitting - Scoot to Edge of Bed Supervision/Verbal cueing  Transfers  Transfers Sit to Stand  Sit to Stand 4: Min assist  Sit to Stand Details Verbal cues for precautions/safety;Verbal cues for technique  Five time sit to stand comments  38sec  Ambulation/Gait  Ambulation/Gait Yes  Ambulation/Gait Assistance 3: Mod assist;2: Max assist (L knee buckling; recovered by therapist)  Gait Pattern Step-to pattern;Scissoring;Ataxic;Trendelenburg;Narrow base of support  Posture/Postural Control  Posture/Postural Control Postural limitations  Postural Limitations Forward head;Rounded Shoulders (otherwise limited assessment as patine unable to stand independent)  Exercises  Exercises Other Exercises  Other Exercises  Nu Step x 10 mins     Objective measurements completed on examination: See above findings.         PT Long Term Goals - 06/13/21 1302  PT LONG TERM GOAL #1   Title Patient will be modified independent with HEP and its progression with assistance    Baseline No knowledge of appropriate ther ex.    Time 3    Period Weeks    Status New    Target Date 07/04/21      PT LONG TERM GOAL #2   Title Patient will demonstrate 5/5  bilateral UE and LE MMT to improve stability during functional tasks.    Baseline Bilateral LE and UE strength grades from 3- to 4-/5.    Time 3    Period Weeks    Status New    Target Date 07/05/21      PT LONG TERM GOAL #3   Title Patient will perform sit to stands x5 with UE support and close supervision to improve transfers.    Baseline min A - 38 sec     Time 3    Period Weeks    Status New      PT LONG TERM GOAL #4   Title Walk in clinic with CGA/SBA with LRAD 500 feet.    Baseline Patient requires HHA - Min/ModA to ambulate safely.    Time 5    Period Weeks    Status New    Target Date 07/18/21      PT LONG TERM GOAL #5   Title Improve Berg score by 10-12 points.    Baseline TBA    Time 6    Period Weeks    Status New    Target Date 07/25/21                    Plan - 06/13/21 1345     Clinical Impression Statement James Hoffman is a 25 yo male presenting to OPPT for care regarding his gait instability, repeated falls, difficulty with transfers, and AVN of the L hip/pain in the L knee. He has previously participated in PT with this facility but due to frequent brain seizures and deconditioning, was unable to attend PT at that time. He returns with goals stated by his Aunt, in order to improve his independence with gait and mobility for increased quality of life and freedom of moiblity. He has a history of cerebellar tumor and major cognitive delay disorder. Upon assessment, patient demonstrates high risk for falls, and poor mobility. Gait in clinic was performed with gait belt and assistance from Aunt intermittently. PT and caregiver discussed plan of care and discussed HEP to which caregiver reported understanding.    Personal Factors and Comorbidities Age;Behavior Pattern;Comorbidity 1;Comorbidity 2;Comorbidity 3+;Education;Past/Current Experience;Transportation;Time since onset of injury/illness/exacerbation;Social Background    Comorbidities Osteoporosis, Cancer, Thyroid Disease, Adrenal insufficiency, Gait instability    Examination-Activity Limitations Bathing;Dressing;Locomotion Level;Squat;Stairs;Stand;Toileting;Transfers    Examination-Participation Restrictions Driving;Medication Management;Interpersonal Relationship;Occupation;Yard Work    Stability/Clinical Decision Making Unstable/Unpredictable    Surveyor, mining Low    Rehab Potential Poor    PT Frequency 2x / week    PT Duration 6 weeks   2 x 3 weeks as appropriate   PT Treatment/Interventions ADLs/Self Care Home Management;Gait training;Therapeutic activities;Therapeutic exercise;Balance training;Patient/family education;Neuromuscular re-education;Orthotic Fit/Training;Wheelchair mobility training;Manual techniques;Passive range of motion    PT Next Visit Plan Low level LE strength in supine, prone, sidelying ; nustep, transfer training, sit to stands progress as tolerated    PT Home Exercise Plan See instructions    Consulted and Agree with Plan of Care Patient;Family member/caregiver    Family Member Consulted Aunt Waldo  Patient will benefit from skilled therapeutic intervention in order to improve the following deficits and impairments:  Abnormal gait, Decreased balance, Decreased endurance, Decreased mobility, Difficulty walking, Impaired vision/preception, Impaired tone, Impaired perceived functional ability, Decreased knowledge of precautions, Cardiopulmonary status limiting activity, Decreased cognition, Decreased activity tolerance, Decreased coordination, Decreased safety awareness, Decreased strength  Visit Diagnosis: Unsteadiness on feet  Muscle weakness (generalized)  Repeated falls     Problem List Patient Active Problem List   Diagnosis Date Noted   Abscess after procedure 06/12/2021   Abnormal chest x-ray 06/24/2020   Decreased lung sounds 06/24/2020   Wound check, abscess 05/20/2020   Depression, major, single episode, severe ([place]) 05/20/2020   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 [place], [place], [address] Phone: [phone]   Fax:  [phone]  Name: James Hoffman MRN: [id number] Date of Birth: 1996-01-23

## 2021-06-16 ENCOUNTER — Other Ambulatory Visit: Payer: Self-pay

## 2021-06-16 ENCOUNTER — Ambulatory Visit: Payer: Medicare Other | Admitting: *Deleted

## 2021-06-16 DIAGNOSIS — R296 Repeated falls: Secondary | ICD-10-CM

## 2021-06-16 DIAGNOSIS — M6281 Muscle weakness (generalized): Secondary | ICD-10-CM | POA: Diagnosis not present

## 2021-06-16 DIAGNOSIS — R2681 Unsteadiness on feet: Secondary | ICD-10-CM | POA: Diagnosis not present

## 2021-06-16 NOTE — Therapy (Signed)
[facility] [place], [place], [address] Phone: [phone]   Fax:  [phone]  Physical Therapy Treatment  Patient Details  Name: James Hoffman MRN: [id number] Date of Birth: 1996-07-28 Referring Provider (PT): Ronnie Doss, DO   Encounter Date: 06/16/2021   PT End of Session - 06/16/21 1505     Visit Number 2    Number of Visits 12    Date for PT Re-Evaluation 07/25/21    PT Start Time [id number]    PT Stop Time [id number]    PT Time Calculation (min) 47 min    Equipment Utilized During Treatment Gait belt;Other (comment)    Activity Tolerance Patient tolerated treatment well             Past Medical History:  Diagnosis Date   Adrenal insufficiency ([place])    Cancer ([place])    brain tumor on brain stem   Hydrocephalus ([place])    Osteoporosis    Thyroid disease     Past Surgical History:  Procedure Laterality Date   brain tumor removal  August 2012   EYE SURGERY Right 2014   cataract removed   HIP SURGERY     teeth removal      There were no vitals filed for this visit.   Subjective Assessment - 06/16/21 1326     Subjective Covid-19 screening performed before entering clinic . Doing ok    Pertinent History History of cancer and chemo tx / hx of brain stem tumor    How long can you walk comfortably? ataxic - not independenet    Currently in Pain? Yes    Pain Score 5     Pain Location Knee    Pain Orientation Left    Pain Descriptors / Indicators Aching    Pain Type Chronic pain    Pain Onset More than a month ago                               [facility] Adult PT Treatment/Exercise - 06/16/21 0001       Exercises   Exercises Lumbar;Knee/Hip      Lumbar Exercises: Aerobic   Nustep L4 x 10 mins monitored for pace/intensity      Lumbar Exercises: Seated   Long Arc Quad on Chair Both;3 sets;10 reps   pause at top 3 secs   LAQ on Chair Weights (lbs) 2    Sit to Stand 20 reps   off  raised mat with walker. Focus on control, especially eccentrics, Gait belt     Lumbar Exercises: Supine   Bridge with Ball Squeeze 20 reps;10 reps   3x10 with focus on control                        PT Long Term Goals - 06/13/21 1302       PT LONG TERM GOAL #1   Title Patient will be modified independent with HEP and its progression with assistance    Baseline No knowledge of appropriate ther ex.    Time 3    Period Weeks    Status New    Target Date 07/04/21      PT LONG TERM GOAL #2   Title Patient will demonstrate 5/5  bilateral UE and LE MMT to improve stability during functional tasks.    Baseline Bilateral LE and UE strength grades from 3- to 4-/5.  Time 3    Period Weeks    Status New    Target Date 07/05/21      PT LONG TERM GOAL #3   Title Patient will perform sit to stands x5 with UE support and close supervision to improve transfers.    Baseline min A - 38 sec    Time 3    Period Weeks    Status New      PT LONG TERM GOAL #4   Title Walk in clinic with CGA/SBA with LRAD 500 feet.    Baseline Patient requires HHA - Min/ModA to ambulate safely.    Time 5    Period Weeks    Status New    Target Date 07/18/21      PT LONG TERM GOAL #5   Title Improve Berg score by 10-12 points.    Baseline TBA    Time 6    Period Weeks    Status New    Target Date 07/25/21                   Plan - 06/16/21 1621     Clinical Impression Statement Pt arrived today in [facility] accompanied by his Aunt. Rx focused on gait with RW ( gait belt at all times), controlled sit to stand off raised mat and RW in front, and strengthening in the seated and supine position. Pt needs constant verbal and tactile cues for technique , but does fairly well once on target. No LOB today.    Personal Factors and Comorbidities Age;Behavior Pattern;Comorbidity 1;Comorbidity 2;Comorbidity 3+;Education;Past/Current Experience;Transportation;Time since onset of  injury/illness/exacerbation;Social Background    Comorbidities Osteoporosis, Cancer, Thyroid Disease, Adrenal insufficiency, Gait instability    Examination-Activity Limitations Bathing;Dressing;Locomotion Level;Squat;Stairs;Stand;Toileting;Transfers    Examination-Participation Restrictions Driving;Medication Management;Interpersonal Relationship;Occupation;Yard Work    Stability/Clinical Decision Making Unstable/Unpredictable    Rehab Potential Poor    PT Frequency 2x / week    PT Duration 6 weeks    PT Treatment/Interventions ADLs/Self Care Home Management;Gait training;Therapeutic activities;Therapeutic exercise;Balance training;Patient/family education;Neuromuscular re-education;Orthotic Fit/Training;Wheelchair mobility training;Manual techniques;Passive range of motion    PT Next Visit Plan Low level LE strength in supine, prone, sidelying ; nustep, transfer training, sit to stands progress as tolerated             Patient will benefit from skilled therapeutic intervention in order to improve the following deficits and impairments:  Abnormal gait, Decreased balance, Decreased endurance, Decreased mobility, Difficulty walking, Impaired vision/preception, Impaired tone, Impaired perceived functional ability, Decreased knowledge of precautions, Cardiopulmonary status limiting activity, Decreased cognition, Decreased activity tolerance, Decreased coordination, Decreased safety awareness, Decreased strength  Visit Diagnosis: Unsteadiness on feet  Muscle weakness (generalized)  Repeated falls     Problem List Patient Active Problem List   Diagnosis Date Noted   Abscess after procedure 06/12/2021   Abnormal chest x-ray 06/24/2020   Decreased lung sounds 06/24/2020   Wound check, abscess 05/20/2020   Depression, major, single episode, severe ([employer]) 05/20/2020   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   Blindness and low vision 10/29/2019   History of eye surgery 10/29/2019   History of  brain tumor 10/29/2019   Gait instability 12/19/2016   Hypogonadism, male 07/19/2016   Normocytic anemia 04/14/2016   Muscular deconditioning 09/17/2015   Hypothyroidism 09/09/2015   Medullary carcinoma ([place]) 09/09/2015   Adrenal insufficiency ([place]) 09/09/2015   Hypotension 09/09/2015   Hyponatremia 09/09/2015   Hypokalemia 09/09/2015    Zamarah Ullmer,[name], PTA 06/16/2021, 4:28 PM  [facility] [address]  [place], [place], [address] Phone: [phone]   Fax:  [phone]  Name: James Hoffman MRN: [id number] Date of Birth: January 12, 1996

## 2021-06-20 ENCOUNTER — Other Ambulatory Visit: Payer: Self-pay

## 2021-06-20 ENCOUNTER — Ambulatory Visit: Payer: Medicare Other | Admitting: Physical Therapy

## 2021-06-20 DIAGNOSIS — M6281 Muscle weakness (generalized): Secondary | ICD-10-CM

## 2021-06-20 DIAGNOSIS — R2681 Unsteadiness on feet: Secondary | ICD-10-CM

## 2021-06-20 DIAGNOSIS — R296 Repeated falls: Secondary | ICD-10-CM | POA: Diagnosis not present

## 2021-06-20 NOTE — Therapy (Signed)
[facility] [place], [place], [address] Phone: [phone]   Fax:  [phone]  Physical Therapy Treatment  Patient Details  Name: James Hoffman MRN: [id number] Date of Birth: 1996-01-28 Referring Provider (PT): Ronnie Doss, DO   Encounter Date: 06/20/2021   PT End of Session - 06/20/21 1354     Visit Number 3    Number of Visits 12    Date for PT Re-Evaluation 07/25/21    PT Start Time 0146    PT Stop Time 0227    PT Time Calculation (min) 41 min    Equipment Utilized During Treatment Gait belt;Other (comment)    Activity Tolerance Patient tolerated treatment well    Behavior During Therapy [facility] for tasks assessed/performed             Past Medical History:  Diagnosis Date   Adrenal insufficiency ([place])    Cancer ([place])    brain tumor on brain stem   Hydrocephalus ([place])    Osteoporosis    Thyroid disease     Past Surgical History:  Procedure Laterality Date   brain tumor removal  August 2012   EYE SURGERY Right 2014   cataract removed   HIP SURGERY     teeth removal      There were no vitals filed for this visit.   Subjective Assessment - 06/20/21 1351     Subjective Covid-19 screening performed before entering clinic . Patient arrived and reported doing good after last treatment.    Patient is accompained by: Family member    Pertinent History History of cancer and chemo tx / hx of brain stem tumor    How long can you walk comfortably? ataxic - not independenet    Currently in Pain? Yes    Pain Score --   no number given   Pain Location Knee    Pain Orientation Left    Pain Descriptors / Indicators Aching    Pain Onset More than a month ago    Pain Frequency Intermittent    Aggravating Factors  NA    Pain Relieving Factors NA                               [facility] Adult PT Treatment/Exercise - 06/20/21 0001       Lumbar Exercises: Aerobic   Nustep L4 x 10 mins  monitored for pace/intensity      Lumbar Exercises: Seated   Long Arc Quad on Chair Both;3 sets;10 reps    LAQ on Chair Weights (lbs) 2    Other Seated Lumbar Exercises core sets with red ball x30      Lumbar Exercises: Supine   Bridge 20 reps   with 2" step on left LE     Knee/Hip Exercises: Standing   Heel Raises Both;1 set;10 reps      Knee/Hip Exercises: Seated   Clamshell with TheraBand Red   20 focus on left   Marching Strengthening;Both;2 sets;10 reps;Weights    Marching Weights 2 lbs.    Hamstring Curl Strengthening;Both;20 reps    Hamstring Limitations red band    Sit to Sand 10 reps;with UE support   right foot fowrad to focus on left                        PT Long Term Goals - 06/20/21 1353  PT LONG TERM GOAL #1   Title Patient will be modified independent with HEP and its progression with assistance    Baseline No knowledge of appropriate ther ex.    Time 3    Period Weeks    Status On-going      PT LONG TERM GOAL #2   Title Patient will demonstrate 5/5  bilateral UE and LE MMT to improve stability during functional tasks.    Baseline Bilateral LE and UE strength grades from 3- to 4-/5.    Time 3    Period Weeks    Status On-going      PT LONG TERM GOAL #3   Title Patient will perform sit to stands x5 with UE support and close supervision to improve transfers.    Baseline min A - 38 sec    Time 3    Period Weeks    Status On-going      PT LONG TERM GOAL #4   Title Walk in clinic with CGA/SBA with LRAD 500 feet.    Baseline Patient requires HHA - Min/ModA to ambulate safely.    Time 5    Period Weeks    Status On-going      PT LONG TERM GOAL #5   Title Improve Berg score by 10-12 points.    Baseline TBA    Time 6    Period Weeks    Status On-going                   Plan - 06/20/21 1440     Clinical Impression Statement Patient tolerated treatment well today. Patient able to progress with exercises today. Patient  able to ambulate form nustep to mat table with CGA/gaitbelt and walker today. Patient unable to perform any walking at home at this time. Patient motivated and did very well post treatment today. Goals ongoing.    Personal Factors and Comorbidities Age;Behavior Pattern;Comorbidity 1;Comorbidity 2;Comorbidity 3+;Education;Past/Current Experience;Transportation;Time since onset of injury/illness/exacerbation;Social Background    Comorbidities Osteoporosis, Cancer, Thyroid Disease, Adrenal insufficiency, Gait instability    Examination-Activity Limitations Bathing;Dressing;Locomotion Level;Squat;Stairs;Stand;Toileting;Transfers    Examination-Participation Restrictions Driving;Medication Management;Interpersonal Relationship;Occupation;Yard Work    Stability/Clinical Decision Making Unstable/Unpredictable    Rehab Potential Poor    PT Frequency 2x / week    PT Duration 6 weeks    PT Treatment/Interventions ADLs/Self Care Home Management;Gait training;Therapeutic activities;Therapeutic exercise;Balance training;Patient/family education;Neuromuscular re-education;Orthotic Fit/Training;Wheelchair mobility training;Manual techniques;Passive range of motion    PT Next Visit Plan Low level LE strength in supine, prone, sidelying ; nustep, transfer training, sit to stands progress as tolerated    Consulted and Agree with Plan of Care Patient;Family member/caregiver             Patient will benefit from skilled therapeutic intervention in order to improve the following deficits and impairments:  Abnormal gait, Decreased balance, Decreased endurance, Decreased mobility, Difficulty walking, Impaired vision/preception, Impaired tone, Impaired perceived functional ability, Decreased knowledge of precautions, Cardiopulmonary status limiting activity, Decreased cognition, Decreased activity tolerance, Decreased coordination, Decreased safety awareness, Decreased strength  Visit Diagnosis: Unsteadiness on  feet  Muscle weakness (generalized)  Repeated falls     Problem List Patient Active Problem List   Diagnosis Date Noted   Abscess after procedure 06/12/2021   Abnormal chest x-ray 06/24/2020   Decreased lung sounds 06/24/2020   Wound check, abscess 05/20/2020   Depression, major, single episode, severe ([place]) 05/20/2020   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   Blindness and low vision 10/29/2019   History of  eye surgery 10/29/2019   History of brain tumor 10/29/2019   Gait instability 12/19/2016   Hypogonadism, male 07/19/2016   Normocytic anemia 04/14/2016   Muscular deconditioning 09/17/2015   Hypothyroidism 09/09/2015   Medullary carcinoma ([place]) 09/09/2015   Adrenal insufficiency ([place]) 09/09/2015   Hypotension 09/09/2015   Hyponatremia 09/09/2015   Hypokalemia 09/09/2015    Sylva Overley P, PTA 06/20/2021, 2:45 PM  [facility] [place], [place], [address] Phone: [phone]   Fax:  [phone]  Name: James Hoffman MRN: [id number] Date of Birth: Dec 14, 1995

## 2021-06-21 DIAGNOSIS — F17210 Nicotine dependence, cigarettes, uncomplicated: Secondary | ICD-10-CM | POA: Diagnosis not present

## 2021-06-21 DIAGNOSIS — S00431D Contusion of right ear, subsequent encounter: Secondary | ICD-10-CM | POA: Diagnosis not present

## 2021-06-21 DIAGNOSIS — H903 Sensorineural hearing loss, bilateral: Secondary | ICD-10-CM | POA: Diagnosis not present

## 2021-06-21 DIAGNOSIS — H6123 Impacted cerumen, bilateral: Secondary | ICD-10-CM | POA: Diagnosis not present

## 2021-06-23 ENCOUNTER — Other Ambulatory Visit: Payer: Self-pay

## 2021-06-23 ENCOUNTER — Ambulatory Visit: Payer: Medicare Other

## 2021-06-23 VITALS — BP 122/77

## 2021-06-23 DIAGNOSIS — R296 Repeated falls: Secondary | ICD-10-CM

## 2021-06-23 DIAGNOSIS — R2681 Unsteadiness on feet: Secondary | ICD-10-CM | POA: Diagnosis not present

## 2021-06-23 DIAGNOSIS — M6281 Muscle weakness (generalized): Secondary | ICD-10-CM

## 2021-06-24 NOTE — Therapy (Signed)
[facility] [place], [place], [address] Phone: [phone]   Fax:  [phone]  Physical Therapy Treatment  Patient Details  Name: James Hoffman MRN: [id number] Date of Birth: 25-Jun-1996 Referring Provider (PT): Ronnie Doss, DO   Encounter Date: 06/23/2021   PT End of Session - 06/23/21 1430     Visit Number 4    Number of Visits 12    Date for PT Re-Evaluation 07/25/21    PT Start Time [id number]    PT Stop Time 1430   patient was escortedd out of clinic by wheelchair with  therapist assist   PT Time Calculation (min) 47 min    Equipment Utilized During Treatment Gait belt;Other (comment)   wheelchair in clinic   Activity Tolerance Patient tolerated treatment well    Behavior During Therapy [facility] for tasks assessed/performed             Past Medical History:  Diagnosis Date   Adrenal insufficiency ([place])    Cancer ([place])    brain tumor on brain stem   Hydrocephalus ([place])    Osteoporosis    Thyroid disease     Past Surgical History:  Procedure Laterality Date   brain tumor removal  August 2012   EYE SURGERY Right 2014   cataract removed   HIP SURGERY     teeth removal      Vitals:   06/23/21 1355  BP: 122/77     Subjective Assessment - 06/23/21 1345     Subjective Covid-19 screening performed before entering clinic . Patient arrived and reports feeling okay but sore on his ear (R)    Pertinent History History of cancer and chemo tx / hx of brain stem tumor    Currently in Pain? Yes    Pain Location Knee    Pain Orientation Left    Pain Descriptors / Indicators Aching    Pain Type Chronic pain    Pain Onset More than a month ago    Multiple Pain Sites No                               [facility] Adult PT Treatment/Exercise - 06/23/21 1345       Transfers   Sit to Stand 4: Min assist;Uncontrolled descent;With armrests;Other (comment)   from wheelchair braced at the door to  prevent retro tipping   Sit to Stand Details Verbal cues for precautions/safety;Verbal cues for technique;Other (comment)   able to progressively stand greater than 5sec with less than minA contact   Sit to Stand Details (indicate cue type and reason) gait belt donned      Wheelchair Mobility   Wheelchair Mobility Yes    Wheelchair Assistance 6: Modified independent (Device/Increase time)    Environmental health practitioner Both lower extermities    Distance 82'    Comments performed with resistance [place] Band at waist and therapist retro to encourage hamstring and resistance; retro propulsion with resistance to encourage quad strength      Therapeutic Activites    Therapeutic Activities ADL's;Other Therapeutic Activities    ADL's sit to stand and pulling up pants for standing for dressing    Other Therapeutic Activities transfer with squat pivot      Exercises   Exercises Other Exercises    Other Exercises  Seated "soccer" for active LAQ - kicking red physioball to therapist (alternating LE use; better with use of RLE - progressively  improved LLE use); stopping 4# ball with foot to encourage accuracy of foot placement; dribbling ball between feet while seated in wheel chair, ball toss (4# ball) top therapist      Lumbar Exercises: Aerobic   Nustep L4 x 10 mins monitored for pace/intensity      Knee/Hip Exercises: Seated   Abd/Adduction Limitations against 2 red therabands                         PT Long Term Goals - 06/20/21 1353       PT LONG TERM GOAL #1   Title Patient will be modified independent with HEP and its progression with assistance    Baseline No knowledge of appropriate ther ex.    Time 3    Period Weeks    Status On-going      PT LONG TERM GOAL #2   Title Patient will demonstrate 5/5  bilateral UE and LE MMT to improve stability during functional tasks.    Baseline Bilateral LE and UE strength grades from 3- to 4-/5.    Time 3    Period Weeks    Status  On-going      PT LONG TERM GOAL #3   Title Patient will perform sit to stands x5 with UE support and close supervision to improve transfers.    Baseline min A - 38 sec    Time 3    Period Weeks    Status On-going      PT LONG TERM GOAL #4   Title Walk in clinic with CGA/SBA with LRAD 500 feet.    Baseline Patient requires HHA - Min/ModA to ambulate safely.    Time 5    Period Weeks    Status On-going      PT LONG TERM GOAL #5   Title Improve Berg score by 10-12 points.    Baseline TBA    Time 6    Period Weeks    Status On-going                   Plan - 06/23/21 1430     Clinical Impression Statement Patient with great partiicpation this visit in PT session. He was highly engaged and performed activities with progressive accuracy indicating adaptability and functional improvement available. He is performing sit to stands with MinA and intermittent modA for a controlled desecent. Chair was stabilized against door in order to prevent retro tipping as he stands with his legs pressed into the edge of the chair - he was able to correct for this following verbal cues to stand without back of knees touching wheelchair. Progressive gait activities to ensue as patient progresses with standing tolerance at parallel bars/counter top. Due to level of comprehension and attention treatment sessions treated as pediatric care with items of "play" form to encourage functional mobility and strength.    Personal Factors and Comorbidities Age;Behavior Pattern;Comorbidity 1;Comorbidity 2;Comorbidity 3+;Education;Past/Current Experience;Transportation;Time since onset of injury/illness/exacerbation;Social Background    Comorbidities Osteoporosis, Cancer, Thyroid Disease, Adrenal insufficiency, Gait instability    Examination-Activity Limitations Bathing;Dressing;Locomotion Level;Squat;Stairs;Stand;Toileting;Transfers    Examination-Participation Restrictions Driving;Medication  Management;Interpersonal Relationship;Occupation;Yard Work    Stability/Clinical Decision Making Unstable/Unpredictable    Designer, jewellery Low    Rehab Potential Poor    PT Frequency 2x / week    PT Duration 6 weeks    PT Treatment/Interventions ADLs/Self Care Home Management;Gait training;Therapeutic activities;Therapeutic exercise;Balance training;Patient/family education;Neuromuscular re-education;Orthotic Fit/Training;Wheelchair mobility training;Manual techniques;Passive range of motion  PT Next Visit Plan LE strength with MinA- MaxA at parrallel bar with gait belt ; nustep, transfer training, sit to stands progress as tolerated    Consulted and Agree with Plan of Care Patient             Patient will benefit from skilled therapeutic intervention in order to improve the following deficits and impairments:  Abnormal gait, Decreased balance, Decreased endurance, Decreased mobility, Difficulty walking, Impaired vision/preception, Impaired tone, Impaired perceived functional ability, Decreased knowledge of precautions, Cardiopulmonary status limiting activity, Decreased cognition, Decreased activity tolerance, Decreased coordination, Decreased safety awareness, Decreased strength  Visit Diagnosis: Unsteadiness on feet  Muscle weakness (generalized)  Repeated falls     Problem List Patient Active Problem List   Diagnosis Date Noted   Abscess after procedure 06/12/2021   Abnormal chest x-ray 06/24/2020   Decreased lung sounds 06/24/2020   Wound check, abscess 05/20/2020   Depression, major, single episode, severe ([place]) 05/20/2020   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   Blindness and low vision 10/29/2019   History of eye surgery 10/29/2019   History of brain tumor 10/29/2019   Gait instability 12/19/2016   Hypogonadism, male 07/19/2016   Normocytic anemia 04/14/2016   Muscular deconditioning 09/17/2015   Hypothyroidism 09/09/2015   Medullary carcinoma ([place])  09/09/2015   Adrenal insufficiency ([place]) 09/09/2015   Hypotension 09/09/2015   Hyponatremia 09/09/2015   Hypokalemia 09/09/2015    Marylou Mccoy PT, DPT 06/24/2021, 8:18 AM  [facility] [address] [place], [place], [address] Phone: [phone]   Fax:  [phone]  Name: James Hoffman MRN: [id number] Date of Birth: 09-26-1996

## 2021-06-30 ENCOUNTER — Ambulatory Visit (INDEPENDENT_AMBULATORY_CARE_PROVIDER_SITE_OTHER): Payer: Medicare Other | Admitting: Licensed Clinical Social Worker

## 2021-06-30 ENCOUNTER — Ambulatory Visit: Payer: Medicare Other

## 2021-06-30 ENCOUNTER — Other Ambulatory Visit: Payer: Self-pay

## 2021-06-30 DIAGNOSIS — R296 Repeated falls: Secondary | ICD-10-CM | POA: Diagnosis not present

## 2021-06-30 DIAGNOSIS — F322 Major depressive disorder, single episode, severe without psychotic features: Secondary | ICD-10-CM | POA: Diagnosis not present

## 2021-06-30 DIAGNOSIS — E890 Postprocedural hypothyroidism: Secondary | ICD-10-CM | POA: Diagnosis not present

## 2021-06-30 DIAGNOSIS — I9589 Other hypotension: Secondary | ICD-10-CM

## 2021-06-30 DIAGNOSIS — M818 Other osteoporosis without current pathological fracture: Secondary | ICD-10-CM | POA: Diagnosis not present

## 2021-06-30 DIAGNOSIS — Z87898 Personal history of other specified conditions: Secondary | ICD-10-CM

## 2021-06-30 DIAGNOSIS — C801 Malignant (primary) neoplasm, unspecified: Secondary | ICD-10-CM

## 2021-06-30 DIAGNOSIS — C716 Malignant neoplasm of cerebellum: Secondary | ICD-10-CM

## 2021-06-30 DIAGNOSIS — M6281 Muscle weakness (generalized): Secondary | ICD-10-CM | POA: Diagnosis not present

## 2021-06-30 DIAGNOSIS — R2681 Unsteadiness on feet: Secondary | ICD-10-CM | POA: Diagnosis not present

## 2021-06-30 NOTE — Chronic Care Management (AMB) (Signed)
Chronic Care Management    Clinical Social Work Note  06/30/2021 Name: James Hoffman MRN: [id number] DOB: [birth year]  James Hoffman is a 25 y.o. year old male who is a primary care patient of James Norlander, DO. The CCM team was consulted to assist the patient with chronic disease management and/or care coordination needs related to: [name]   Engaged with patient / grandmother of patient, James Hoffman telephone for follow up visit in response to provider referral for social work chronic care management and care coordination services.   Consent to Services:  The patient was given information about Chronic Care Management services, agreed to services, and gave verbal consent prior to initiation of services.  Please see initial visit note for detailed documentation.   Patient agreed to services and consent obtained.   Assessment: Review of patient past medical history, allergies, medications, and health status, including review of relevant consultants reports was performed today as part of a comprehensive evaluation and provision of chronic care management and care coordination services.     SDOH (Social Determinants of Health) assessments and interventions performed:  SDOH Interventions    Flowsheet Row Most Recent Value  SDOH Interventions   Physical Activity Interventions Other (Comments)  [client is receiving physical therapy sessions weekly as scheduled]  Depression Interventions/Treatment  Currently on Treatment        Advanced Directives Status: See Vynca application for related entries.  CCM Care Plan  Allergies  Allergen Reactions   Tramadol Anaphylaxis   Morphine Nausea And Vomiting    According to mother the patient is intolerant of morphine; causes nausea and vomiting.  Has had none since he was 25yo.    Outpatient Encounter Medications as of 06/30/2021  Medication Sig   atorvastatin (LIPITOR) 40 MG tablet    benzonatate (TESSALON)  200 MG capsule Take 1 capsule (200 mg total) by mouth 3 (three) times daily as needed.   busPIRone (BUSPAR) 5 MG tablet TAKE 1 TABLET (5 MG TOTAL) BY MOUTH 3 (THREE) TIMES DAILY. FOR ANXIETY   cetirizine (ZYRTEC) 10 MG tablet Take 1 tablet (10 mg total) by mouth daily.   Cholecalciferol 50 MCG (2000 UT) CAPS Take by mouth.   clotrimazole-betamethasone (LOTRISONE) cream Apply 1 application topically 2 (two) times daily. x7-10   dapsone 100 MG tablet Take 1 tablet (100 mg total) by mouth daily.   escitalopram (LEXAPRO) 10 MG tablet Take 1 tablet (10 mg total) by mouth daily.   fluconazole (DIFLUCAN) 100 MG tablet Take two with first dose. Then starting the next day take one daily until all are taken.   fluticasone (FLONASE) 50 MCG/ACT nasal spray Place 2 sprays into both nostrils daily.   fluticasone (FLOVENT HFA) 44 MCG/ACT inhaler Inhale 2 puffs into the lungs 2 (two) times daily.   levETIRAcetam (KEPPRA) 750 MG tablet Take 1,500 mg by mouth in the morning and at bedtime.   levothyroxine (SYNTHROID) 112 MCG tablet Take 1 tablet by mouth daily.   naproxen (NAPROSYN) 500 MG tablet TAKE 1 TABLET (500 MG TOTAL) BY MOUTH 2 (TWO) TIMES DAILY AS NEEDED FOR MODERATE PAIN.   omeprazole (PRILOSEC) 20 MG capsule Take 1 capsule (20 mg total) by mouth daily. At lunch time.   Spacer/Aero-Holding Chambers (AEROCHAMBER PLUS) inhaler Use as instructed   Testosterone 20.25 MG/ACT (1.62%) GEL ONE PUM ON EACH SHOULDER EVERY MORNIG   thiamine 100 MG tablet Take by mouth.   VENTOLIN HFA 108 (90 Base) MCG/ACT inhaler TAKE 2  PUFFS BY MOUTH EVERY 6 HOURS AS NEEDED FOR WHEEZE OR SHORTNESS OF BREATH   No facility-administered encounter medications on file as of 06/30/2021.    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   Blindness and low vision 10/29/2019   History of eye surgery 10/29/2019   History of brain tumor 10/29/2019   Gait instability 12/19/2016   Hypogonadism, male 07/19/2016   Normocytic anemia 04/14/2016   Muscular deconditioning 09/17/2015   Hypothyroidism 09/09/2015   Medullary carcinoma ([place]) 09/09/2015   Adrenal insufficiency ([place]) 09/09/2015   Hypotension 09/09/2015   Hyponatremia 09/09/2015   Hypokalemia 09/09/2015    Conditions to be addressed/monitored: monitor client management of depression issues  Care Plan : LCSW care plan  Updates made by Katha Cabal, LCSW since 06/30/2021 12:00 AM     Problem: Depression Identification (Depression)      Goal: Depressive Symptoms Identified: Manage depression sympotms faced   Start Date: 06/30/2021  Expected End Date: 09/28/2021  This Visit's Progress: On track  Recent Progress: On track  Priority: Medium  Note:   Current Barriers:  Chronic Mental Health needs related to depression and depression management Mobility issues Transportation needs Suicidal Ideation/Homicidal Ideation: No  Clinical Social Work Goal(s):  patient Medical illustrator will work with SW monthly by telephone or in person to reduce or manage symptoms related to depression and depression management for client Patient will attend all scheduled medical appointments in next 30 days Patient or family representative will call RNCM or LCSW as needed in next 30 days for CCM support for client  Interventions: 1:1 collaboration with James Norlander, DO regarding development and update of comprehensive plan of care as evidenced by provider attestation and co-signature Talked with James Hoffman, grandmother of client, about client needs Talked with James Hoffman about physical therapy sessions received by client Talked with James Hoffman about appetite of client Talked with James Hoffman about pain issues of client Talked with  James Hoffman about hearing issues of client (she said client has hearing deficits) Talked with client about mobility of client Provided counseling support for James Hoffman related to needs of client Talked with James Hoffman about memory challenges of client James Hoffman said she has to remind Aime to eat to keep his weight up) Talked with James Hoffman about client use of Boost supplement Talked with James Hoffman about medication procurement of client Talked with James Hoffman about home visits of speech therapist with client (speech therapist has now completed sessions with client) Talked with James Hoffman about  swallowing issues of client James Hoffman said today that client is swallowing well and is eating better at present) Talked with James Hoffman abut sleeping issues of client Talked with James Hoffman about upcoming client medical appointments Talked with James Hoffman about transport needs of client (client aunt , [name], takes client to and from client appointments) Talked with James Hoffman about smoking of client Talked with James Hoffman about vision of client James Hoffman said she thinks client has decreased vision) Talked with James Hoffman about client past use of hearing aid Talked with James Hoffman about incontinency issues of client James Hoffman said incontinency issues of client have improved greatly) Encouraged James Hoffman or client to call [facility] as needed for nursing support Talked with James Hoffman about CAP support for client Talked with James Hoffman about client meals (she said he is eating 2-3 meals  per day plus snacks occasionally. Talked with James Hoffman about support for client from [name], aunt of client  Patient Self Care Activities:  Attends scheduled medical appointments Has strong family support  Patient Coping Strengths:  Supportive Relationships Family  Patient Self Care Deficits:  Mobility issues Transport needs  Patient Goals:  - spend time or talk with others at least 2 to 3 times per week - practice relaxation or meditation daily - keep a calendar with  appointment dates  Follow Up Plan: LCSW to call client or James Hoffman, grandmother of client on 08/10/21  to assess needs of client     Fredy Chirinos.Gregg Winchell MSW, LCSW Licensed Clinical Social Worker [facility] Care Management [phone]

## 2021-06-30 NOTE — Patient Instructions (Signed)
Visit Information  PATIENT GOALS:  Goals Addressed             This Visit's Progress    Manage My Emotions; Manage Depression issues faced       Timeframe:  Short-Term Goal Priority:  Medium Progress: On Track Start Date:             06/30/21                Expected End Date:           09/30/21            Follow Up Date:  08/10/21   Manage Emotions. Manage Depression issues faced    Why is this important?   When you are stressed, down or upset, your body reacts too.  For example, your blood pressure may get higher; you may have a headache or stomachache.  When your emotions get the best of you, your body's ability to fight off cold and flu gets weak.  These steps will help you manage your emotions.     Patient Self Care Activities:  Attends scheduled medical appointments Has strong family support  Patient Coping Strengths:  Supportive Relationships Family  Patient Self Care Deficits:  Mobility issues Transport needs  Patient Goals:  - spend time or talk with others at least 2 to 3 times per week - practice relaxation or meditation daily - keep a calendar with appointment dates  Follow Up Plan: LCSW to call client or [name], grandmother of client, on 08/10/21 to assess needs of client     Bracyn Amicone.Elnor Renovato MSW, LCSW Licensed Clinical Social Worker [facility] Care Management [phone]

## 2021-06-30 NOTE — Therapy (Signed)
[facility] [place], [place], [address] Phone: [phone]   Fax:  [phone]  Physical Therapy Treatment  Patient Details  Name: James Hoffman MRN: [id number] Date of Birth: August 11, 1996 Referring Provider (PT): Ronnie Doss, DO   Encounter Date: 06/30/2021   PT End of Session - 06/30/21 1737     Visit Number 5    Number of Visits 12    PT Start Time [id number]    PT Stop Time 1430    PT Time Calculation (min) 45 min    Equipment Utilized During Treatment Gait belt;Other (comment)   WC   Activity Tolerance Patient tolerated treatment well    Behavior During Therapy [facility] for tasks assessed/performed             Past Medical History:  Diagnosis Date   Adrenal insufficiency ([place])    Cancer ([place])    brain tumor on brain stem   Hydrocephalus ([place])    Osteoporosis    Thyroid disease     Past Surgical History:  Procedure Laterality Date   brain tumor removal  August 2012   EYE SURGERY Right 2014   cataract removed   HIP SURGERY     teeth removal      There were no vitals filed for this visit.   Subjective Assessment - 06/30/21 1729     Subjective Covid-19 screening performed before entering clinic . Patient arrived with Aunt today and she states that he has been trying very hard  and wants to walk. He states that he hates scooting to the bathroom.    Patient is accompained by: Family member    Pertinent History History of cancer and chemo tx / hx of brain stem tumor    Currently in Pain? No/denies                  [facility] Adult PT Treatment/Exercise - 06/30/21 0001       Bed Mobility   Bed Mobility Sit to Supine;Supine to Sit    Supine to Sit Independent    Sitting - Scoot to Edge of Bed Independent    Sit to Supine Independent      Transfers   Transfers Sit to [employer] Transfers    Sit to Stand 4: Min assist    Sit to Stand Details Manual facilitation for weight bearing;Other  (comment);Manual facilitation for placement   gait belt donned   Squat Pivot Transfers 7: Independent    Number of Reps 10 reps      Ambulation/Gait   Ambulation/Gait Yes    Ambulation/Gait Assistance 3: Mod assist   UE on // bars - patient guarded by therapist to perform fwd/retro walking, progressive improvement with retro walking noted with improved knee flexion and hip extension   Gait Pattern Step-to pattern;Lateral hip instability   poor core control   Pre-Gait Activities accuracy trainiing for foot placement with foot tapping cones on floor (limitattions to the L due to impaired visual Veterinary surgeon Yes    Wheelchair Assistance 6: Modified independent (Device/Increase time)    Environmental health practitioner Both lower extermities      Balance   Balance Assessed Yes      Exercises   Exercises Other Exercises    Other Exercises  Seated in WC cone taps x 2 mins for foot place accuracy, seated pressdowns with knee raises to physioball for core strenght and hip flexor motor  control x 15 each, Supine ball to knees press down for cor strength, supine  DKTC w/ feet on physaipoball for coordination and hamstring strength (introduced perturbations to encourage grater proximal hip strength and control, sit to stand with CGA-minA (gaiut belt donnd) from wheelchair with hands placed on 6" box at the highest level the plinth rises,      Lumbar Exercises: Aerobic   Nustep L4 x 8 mins monitored for pace/intensity                         PT Long Term Goals - 06/20/21 1353       PT LONG TERM GOAL #1   Title Patient will be modified independent with HEP and its progression with assistance    Baseline No knowledge of appropriate ther ex.    Time 3    Period Weeks    Status On-going      PT LONG TERM GOAL #2   Title Patient will demonstrate 5/5  bilateral UE and LE MMT to improve stability during functional tasks.    Baseline Bilateral LE and UE  strength grades from 3- to 4-/5.    Time 3    Period Weeks    Status On-going      PT LONG TERM GOAL #3   Title Patient will perform sit to stands x5 with UE support and close supervision to improve transfers.    Baseline min A - 38 sec    Time 3    Period Weeks    Status On-going      PT LONG TERM GOAL #4   Title Walk in clinic with CGA/SBA with LRAD 500 feet.    Baseline Patient requires HHA - Min/ModA to ambulate safely.    Time 5    Period Weeks    Status On-going      PT LONG TERM GOAL #5   Title Improve Berg score by 10-12 points.    Baseline TBA    Time 6    Period Weeks    Status On-going                   Plan - 06/30/21 1738     Clinical Impression Statement COVID-19 screening performed upon arrival. Patient well engaged and enthusiastic with participation with PT. He requires multiple verbal cues and some redirection for multiple-step tasks. He was able to progress to CGA with sit to stands and use of UE in front of him on a box rather than beside him on an armrest. He also demonstrated progressive improvement in knee flexion control and hip stability woth physioball DKTC - by drawing knees to chest in a direct line. He was challenged to walk in the // bars with PT, gait beelt donned - but had one LOB, no fall with retro propulsion. Without VCs he progressively performed knee flexion anf hip extension adequately for retro propulsion. During step ups onto 8" step, he initiated with use of LLE (weaker side) and accomplishe dwothout deviation, CGA. Skilled PT recommended to continue as patient demonstrates mild improvements through session and seeks to reach goals of being able to walk to the bathroom.    Personal Factors and Comorbidities Age;Behavior Pattern;Comorbidity 1;Comorbidity 2;Comorbidity 3+;Education;Past/Current Experience;Transportation;Time since onset of injury/illness/exacerbation;Social Background    Comorbidities Osteoporosis, Cancer, Thyroid  Disease, Adrenal insufficiency, Gait instability    Examination-Activity Limitations Bathing;Dressing;Locomotion Level;Squat;Stairs;Stand;Toileting;Transfers    Examination-Participation Restrictions Driving;Medication Management;Interpersonal Relationship;Occupation;Yard Work    Risk analyst  Making Unstable/Unpredictable    Clinical Decision Making Low    Rehab Potential Poor    PT Frequency 2x / week    PT Duration 6 weeks    PT Treatment/Interventions ADLs/Self Care Home Management;Gait training;Therapeutic activities;Therapeutic exercise;Balance training;Patient/family education;Neuromuscular re-education;Orthotic Fit/Training;Wheelchair mobility training;Manual techniques;Passive range of motion    PT Next Visit Plan LE strength with MinA-at parrallel bar with gait belt ; nustep, transfer training, sit to stands progress as tolerated, core strength items, seated dynamic nbalance without back support    Consulted and Agree with Plan of Care Patient;Family member/caregiver    Family Member Consulted Aunt Makyla Bye             Patient will benefit from skilled therapeutic intervention in order to improve the following deficits and impairments:  Abnormal gait, Decreased balance, Decreased endurance, Decreased mobility, Difficulty walking, Impaired vision/preception, Impaired tone, Impaired perceived functional ability, Decreased knowledge of precautions, Cardiopulmonary status limiting activity, Decreased cognition, Decreased activity tolerance, Decreased coordination, Decreased safety awareness, Decreased strength  Visit Diagnosis: Unsteadiness on feet  Muscle weakness (generalized)  Repeated falls     Problem List Patient Active Problem List   Diagnosis Date Noted   Abscess after procedure 06/12/2021   Abnormal chest x-ray 06/24/2020   Decreased lung sounds 06/24/2020   Wound check, abscess 05/20/2020   Depression, major, single episode, severe ([place]) 05/20/2020    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/09/2019   Blindness and low vision 10/29/2019   History of eye surgery 10/29/2019   History of brain tumor 10/29/2019   Gait instability 12/19/2016   Hypogonadism, male 07/19/2016   Normocytic anemia 04/14/2016   Muscular deconditioning 09/17/2015   Hypothyroidism 09/09/2015   Medullary carcinoma ([place]) 09/09/2015   Adrenal insufficiency ([place]) 09/09/2015   Hypotension 09/09/2015   Hyponatremia 09/09/2015   Hypokalemia 09/09/2015    Marylou Mccoy PT, DPT 06/30/2021, 5:45 PM  [facility] [address] [place], [place], [address] Phone: [phone]   Fax:  [phone]  Name: James Hoffman MRN: [id number] Date of Birth: [birth year]

## 2021-07-05 ENCOUNTER — Other Ambulatory Visit: Payer: Self-pay

## 2021-07-05 ENCOUNTER — Ambulatory Visit: Payer: Medicare Other

## 2021-07-05 DIAGNOSIS — R2681 Unsteadiness on feet: Secondary | ICD-10-CM | POA: Diagnosis not present

## 2021-07-05 DIAGNOSIS — R296 Repeated falls: Secondary | ICD-10-CM | POA: Diagnosis not present

## 2021-07-05 DIAGNOSIS — M6281 Muscle weakness (generalized): Secondary | ICD-10-CM | POA: Diagnosis not present

## 2021-07-05 NOTE — Therapy (Signed)
[facility] [place], [place], [address] Phone: [phone]   Fax:  [phone]  Physical Therapy Treatment  Patient Details  Name: James Hoffman MRN: [id number] Date of Birth: February 23, 1996 Referring Provider (PT): Ronnie Doss, DO   Encounter Date: 07/05/2021   PT End of Session - 07/05/21 1705     Visit Number 6    Number of Visits 12    Date for PT Re-Evaluation 07/25/21    PT Start Time [id number]    PT Stop Time [id number]    PT Time Calculation (min) 37 min             Past Medical History:  Diagnosis Date   Adrenal insufficiency ([place])    Cancer ([place])    brain tumor on brain stem   Hydrocephalus ([place])    Osteoporosis    Thyroid disease     Past Surgical History:  Procedure Laterality Date   brain tumor removal  August 2012   EYE SURGERY Right 2014   cataract removed   HIP SURGERY     teeth removal      There were no vitals filed for this visit.   Subjective Assessment - 07/05/21 1658     Subjective Covid-19 screening performed before entering clinic . Patient arrived with Aunt today and she states that he was sore after last visit but feel ok. Ear on right appear bruised in color- he denies it bothering him.    Patient is accompained by: Family member    Pertinent History History of cancer and chemo tx / hx of brain stem tumor    How long can you walk comfortably? ataxic - not independenet    Currently in Pain? No/denies                               [facility] Adult PT Treatment/Exercise - 07/05/21 0001       Transfers   Transfers Sit to [facility];Supine to Sit;Sit to Supine    Sit to Stand 5: Supervision;6: Modified independent (Device/Increase time)    Sit to Stand Details --   hand placement VCs for use of RW   Sit to Stand Details (indicate cue type and reason) gait belt donned      Ambulation/Gait   Ambulation/Gait Yes    Ambulation/Gait Assistance 6:  Modified independent (Device/Increase time);4: Min guard   gait belt donned - contact guard and wheelchair follow provided. patient initiated first few steps with mild impulse and without contact guard. Able ot navigate wit hRW. VCs provided to enourage proper/safe hand placement on walker and WC   Ambulation Distance (Feet) 92 Feet    Gait Pattern Step-to pattern;Lateral hip instability      Wheelchair Mobility   Wheelchair Mobility Yes    Wheelchair Assistance 6: Modified independent (Device/Increase time)    Environmental health practitioner Both lower extermities      Exercises   Exercises Other Exercises    Other Exercises  seated balance feet elevated from floor on plinth, arms crossed, marching, seated physioball pressdowns with knee raises                         PT Long Term Goals - 06/20/21 1353       PT LONG TERM GOAL #1   Title Patient will be modified independent with HEP and its progression with assistance  Baseline No knowledge of appropriate ther ex.    Time 3    Period Weeks    Status On-going      PT LONG TERM GOAL #2   Title Patient will demonstrate 5/5  bilateral UE and LE MMT to improve stability during functional tasks.    Baseline Bilateral LE and UE strength grades from 3- to 4-/5.    Time 3    Period Weeks    Status On-going      PT LONG TERM GOAL #3   Title Patient will perform sit to stands x5 with UE support and close supervision to improve transfers.    Baseline min A - 38 sec    Time 3    Period Weeks    Status On-going      PT LONG TERM GOAL #4   Title Walk in clinic with CGA/SBA with LRAD 500 feet.    Baseline Patient requires HHA - Min/ModA to ambulate safely.    Time 5    Period Weeks    Status On-going      PT LONG TERM GOAL #5   Title Improve Berg score by 10-12 points.    Baseline TBA    Time 6    Period Weeks    Status On-going                   Plan - 07/05/21 1705     Clinical Impression Statement COVID-19  screening performed upon arrival.  Patient participates very well in PT this visit with improved standing tolerance and needing less verbal cues for hand placements with progression of therpy session and repetition. He is able to ambulate the hallway with a rolling walker without much assistnace. Wheelchair followed and gait belt donned for safety. Core strength activity progressed this visit. Plan to continue per plan of car to encourage increased ease of gait, balance and transfers.    Personal Factors and Comorbidities Age;Behavior Pattern;Comorbidity 1;Comorbidity 2;Comorbidity 3+;Education;Past/Current Experience;Transportation;Time since onset of injury/illness/exacerbation;Social Background    Comorbidities Osteoporosis, Cancer, Thyroid Disease, Adrenal insufficiency, Gait instability    Examination-Activity Limitations Bathing;Dressing;Locomotion Level;Squat;Stairs;Stand;Toileting;Transfers    Stability/Clinical Decision Making Unstable/Unpredictable    Clinical Decision Making Low    Rehab Potential Poor    PT Frequency 2x / week    PT Duration 6 weeks    PT Treatment/Interventions ADLs/Self Care Home Management;Gait training;Therapeutic activities;Therapeutic exercise;Balance training;Patient/family education;Neuromuscular re-education;Orthotic Fit/Training;Wheelchair mobility training;Manual techniques;Passive range of motion    PT Next Visit Plan LE strength with MinA-at parrallel bar with gait belt ; nustep, transfer training, sit to stands progress as tolerated, core strength items, seated dynamic nbalance without back support    Consulted and Agree with Plan of Care Patient;Family member/caregiver    Family Member Consulted Rob Hickman             Patient will benefit from skilled therapeutic intervention in order to improve the following deficits and impairments:  Abnormal gait, Decreased balance, Decreased endurance, Decreased mobility, Difficulty walking, Impaired  vision/preception, Impaired tone, Impaired perceived functional ability, Decreased knowledge of precautions, Cardiopulmonary status limiting activity, Decreased cognition, Decreased activity tolerance, Decreased coordination, Decreased safety awareness, Decreased strength  Visit Diagnosis: Unsteadiness on feet  Muscle weakness (generalized)  Repeated falls     Problem List Patient Active Problem List   Diagnosis Date Noted   Abscess after procedure 06/12/2021   Abnormal chest x-ray 06/24/2020   Decreased lung sounds 06/24/2020   Wound check, abscess 05/20/2020   Depression,  major, single episode, severe ([place]) 05/20/2020   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   Blindness and low vision 10/29/2019   History of eye surgery 10/29/2019   History of brain tumor 10/29/2019   Gait instability 12/19/2016   Hypogonadism, male 07/19/2016   Normocytic anemia 04/14/2016   Muscular deconditioning 09/17/2015   Hypothyroidism 09/09/2015   Medullary carcinoma ([place]) 09/09/2015   Adrenal insufficiency ([place]) 09/09/2015   Hypotension 09/09/2015   Hyponatremia 09/09/2015   Hypokalemia 09/09/2015    Marylou Mccoy PT, DPT 07/05/2021, 5:09 PM  [facility] [address] [place], [place], [address] Phone: [phone]   Fax:  [phone]  Name: James Hoffman MRN: [id number] Date of Birth: 09/13/1996

## 2021-07-12 ENCOUNTER — Telehealth: Payer: Medicare Other

## 2021-07-13 ENCOUNTER — Ambulatory Visit: Payer: Medicare Other | Admitting: Physical Therapy

## 2021-07-13 ENCOUNTER — Other Ambulatory Visit: Payer: Self-pay

## 2021-07-13 DIAGNOSIS — M6281 Muscle weakness (generalized): Secondary | ICD-10-CM

## 2021-07-13 DIAGNOSIS — R2681 Unsteadiness on feet: Secondary | ICD-10-CM | POA: Diagnosis not present

## 2021-07-13 DIAGNOSIS — R296 Repeated falls: Secondary | ICD-10-CM | POA: Diagnosis not present

## 2021-07-13 NOTE — Therapy (Signed)
[facility] [place], [place], [address] Phone: [phone]   Fax:  [phone]  Physical Therapy Treatment  Patient Details  Name: James Hoffman MRN: [id number] Date of Birth: [birth year] Referring Provider (PT): Ronnie Doss, DO   Encounter Date: 07/13/2021   PT End of Session - 07/13/21 1117     Visit Number 7    Number of Visits 12    Date for PT Re-Evaluation 07/25/21    PT Start Time 1114    PT Stop Time 1155    PT Time Calculation (min) 41 min    Equipment Utilized During Treatment Gait belt;Other (comment)   WC   Activity Tolerance Patient tolerated treatment well;Patient limited by fatigue    Behavior During Therapy [facility] for tasks assessed/performed             Past Medical History:  Diagnosis Date   Adrenal insufficiency ([place])    Cancer ([place])    brain tumor on brain stem   Hydrocephalus ([place])    Osteoporosis    Thyroid disease     Past Surgical History:  Procedure Laterality Date   brain tumor removal  August 2012   EYE SURGERY Right 2014   cataract removed   HIP SURGERY     teeth removal      There were no vitals filed for this visit.   Subjective Assessment - 07/13/21 1116     Subjective Covid-19 screening performed before entering clinic . Patient arrived with no complaints and doing well.    Pertinent History History of cancer and chemo tx / hx of brain stem tumor    How long can you walk comfortably? ataxic - not independenet    Currently in Pain? No/denies                               [facility] Adult PT Treatment/Exercise - 07/13/21 0001       Ambulation/Gait   Ambulation/Gait Yes    Ambulation/Gait Assistance 6: Modified independent (Device/Increase time)    Ambulation Distance (Feet) 40 Feet    Gait Pattern Step-to pattern;Lateral hip instability      Lumbar Exercises: Aerobic   Nustep L3 x22mn UE/LE monitored      Lumbar Exercises: Seated    Other Seated Lumbar Exercises core sets with red ball x30      Lumbar Exercises: Supine   Bridge 20 reps   unable to progress with bridges and only able to perfrom 20reps   Straight Leg Raise 3 seconds;15 reps   Bil     Knee/Hip Exercises: Seated   Ball Squeeze x30    Clamshell with TheraBand Red   x20 focus on left LE   Marching Strengthening;Both;10 reps;3 sets    Marching Weights 3 lbs.    Hamstring Curl Strengthening;Both;20 reps;10 reps    Hamstring Limitations red band    Abd/Adduction Limitations with red band x30    Sit to Sand 1 set;5 reps;with UE support   difficulty today, fatigue                        PT Long Term Goals - 07/13/21 1118       PT LONG TERM GOAL #1   Title Patient will be modified independent with HEP and its progression with assistance    Time 3    Period Weeks    Status On-going  PT LONG TERM GOAL #2   Title Patient will demonstrate 5/5  bilateral UE and LE MMT to improve stability during functional tasks.    Baseline Bilateral LE and UE strength grades from 3- to 4-/5.    Time 3    Period Weeks    Status On-going      PT LONG TERM GOAL #3   Title Patient will perform sit to stands x5 with UE support and close supervision to improve transfers.    Baseline Min assist today 07/13/21    Time 3    Period Weeks    Status On-going      PT LONG TERM GOAL #4   Title Walk in clinic with CGA/SBA with LRAD 500 feet.    Baseline Patient requires HHA - Min/ModA to ambulate safely.    Time 5    Period Weeks    Status On-going      PT LONG TERM GOAL #5   Title Improve Berg score by 10-12 points.    Time 6    Period Weeks    Status On-going                   Plan - 07/13/21 1137     Clinical Impression Statement Patient tolerated treatment fair today due to fatigue and required rest breaks. Patient only able to perform minimal standing activity today. Patient continues to require assist with ambulation and sit to  stands. Patient able to progress with seated exercises with improved reps and weight. Improved left LE tolerance to exercises yet weaker than right LE. Goals ongoing today.    Personal Factors and Comorbidities Age;Behavior Pattern;Comorbidity 1;Comorbidity 2;Comorbidity 3+;Education;Past/Current Experience;Transportation;Time since onset of injury/illness/exacerbation;Social Background    Comorbidities Osteoporosis, Cancer, Thyroid Disease, Adrenal insufficiency, Gait instability    Examination-Activity Limitations Bathing;Dressing;Locomotion Level;Squat;Stairs;Stand;Toileting;Transfers    Examination-Participation Restrictions Driving;Medication Management;Interpersonal Relationship;Occupation;Yard Work    Stability/Clinical Decision Making Unstable/Unpredictable    Rehab Potential Poor    PT Frequency 2x / week    PT Duration 6 weeks    PT Treatment/Interventions ADLs/Self Care Home Management;Gait training;Therapeutic activities;Therapeutic exercise;Balance training;Patient/family education;Neuromuscular re-education;Orthotic Fit/Training;Wheelchair mobility training;Manual techniques;Passive range of motion    PT Next Visit Plan LE strength with MinA-at parrallel bar with gait belt ; nustep, transfer training, sit to stands progress as tolerated, core strength items, seated dynamic nbalance without back support    Consulted and Agree with Plan of Care Patient             Patient will benefit from skilled therapeutic intervention in order to improve the following deficits and impairments:  Abnormal gait, Decreased balance, Decreased endurance, Decreased mobility, Difficulty walking, Impaired vision/preception, Impaired tone, Impaired perceived functional ability, Decreased knowledge of precautions, Cardiopulmonary status limiting activity, Decreased cognition, Decreased activity tolerance, Decreased coordination, Decreased safety awareness, Decreased strength  Visit Diagnosis: Unsteadiness  on feet  Muscle weakness (generalized)  Repeated falls     Problem List Patient Active Problem List   Diagnosis Date Noted   Abscess after procedure 06/12/2021   Abnormal chest x-ray 06/24/2020   Decreased lung sounds 06/24/2020   Wound check, abscess 05/20/2020   Depression, major, single episode, severe ([place]) 05/20/2020   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   Blindness and low vision 10/29/2019   History of eye surgery 10/29/2019   History of brain tumor 10/29/2019   Gait instability 12/19/2016   Hypogonadism, male 07/19/2016   Normocytic anemia 04/14/2016   Muscular deconditioning 09/17/2015   Hypothyroidism 09/09/2015   Medullary  carcinoma ([place]) 09/09/2015   Adrenal insufficiency ([place]) 09/09/2015   Hypotension 09/09/2015   Hyponatremia 09/09/2015   Hypokalemia 09/09/2015    Olar Santini P, PTA 07/13/2021, 11:57 AM  [facility] [place], [place], [address] Phone: [phone]   Fax:  [phone]  Name: James Hoffman MRN: [id number] Date of Birth: 1996-02-23

## 2021-07-14 ENCOUNTER — Ambulatory Visit: Payer: Medicare Other | Attending: Family Medicine | Admitting: *Deleted

## 2021-07-14 ENCOUNTER — Other Ambulatory Visit: Payer: Self-pay

## 2021-07-14 DIAGNOSIS — R296 Repeated falls: Secondary | ICD-10-CM | POA: Diagnosis not present

## 2021-07-14 DIAGNOSIS — M6281 Muscle weakness (generalized): Secondary | ICD-10-CM | POA: Diagnosis not present

## 2021-07-14 DIAGNOSIS — R2681 Unsteadiness on feet: Secondary | ICD-10-CM | POA: Insufficient documentation

## 2021-07-14 NOTE — Therapy (Signed)
[facility] [place], [place], [address] Phone: [phone]   Fax:  [phone]  Physical Therapy Treatment  Patient Details  Name: James Hoffman MRN: [id number] Date of Birth: 04-24-1996 Referring Provider (PT): Ronnie Doss, DO   Encounter Date: 07/14/2021   PT End of Session - 07/14/21 1533     Visit Number 8    Number of Visits 12    Date for PT Re-Evaluation 07/25/21    PT Start Time [id number]    PT Stop Time [id number]    PT Time Calculation (min) 47 min             Past Medical History:  Diagnosis Date   Adrenal insufficiency ([place])    Cancer ([place])    brain tumor on brain stem   Hydrocephalus ([place])    Osteoporosis    Thyroid disease     Past Surgical History:  Procedure Laterality Date   brain tumor removal  August 2012   EYE SURGERY Right 2014   cataract removed   HIP SURGERY     teeth removal      There were no vitals filed for this visit.   Subjective Assessment - 07/14/21 1523     Subjective Covid-19 screening performed before entering clinic . Patient arrived doing fair with energy some LT knee pain    Pertinent History History of cancer and chemo tx / hx of brain stem tumor    How long can you walk comfortably? ataxic - not independenet    Currently in Pain? Yes    Pain Location Knee    Pain Orientation Left    Pain Descriptors / Indicators Sore    Pain Onset More than a month ago                               [facility] Adult PT Treatment/Exercise - 07/14/21 0001       Ambulation/Gait   Ambulation/Gait Yes    Ambulation/Gait Assistance 6: Modified independent (Device/Increase time);4: Min guard   gait belt donned - contact guard  . Able ot navigate wit hRW. VCs provided to enourage proper/safe hand placement on walker posture   Ambulation Distance (Feet) 101 Feet    Assistive device Rolling walker    Gait Pattern Step-to pattern;Lateral hip instability   poor core  control     Lumbar Exercises: Aerobic   Nustep L4 x15mn UE/LE monitored      Lumbar Exercises: Seated   Other Seated Lumbar Exercises edge of mat with focus on erect posture while performing core exs    Other Seated Lumbar Exercises sit to stand with focus on glutes      Lumbar Exercises: Supine   Bridge 20 reps   unable to progress with bridges and only able to perfrom 20reps     Knee/Hip Exercises: Seated   Ball Squeeze x20    Sit to [employer]2 sets;10 reps;with UE support   at walker with focus on Pt activating glutes more to stand                        PT Long Term Goals - 07/13/21 1118       PT LONG TERM GOAL #1   Title Patient will be modified independent with HEP and its progression with assistance    Time 3    Period Weeks    Status On-going  PT LONG TERM GOAL #2   Title Patient will demonstrate 5/5  bilateral UE and LE MMT to improve stability during functional tasks.    Baseline Bilateral LE and UE strength grades from 3- to 4-/5.    Time 3    Period Weeks    Status On-going      PT LONG TERM GOAL #3   Title Patient will perform sit to stands x5 with UE support and close supervision to improve transfers.    Baseline Min assist today 07/13/21    Time 3    Period Weeks    Status On-going      PT LONG TERM GOAL #4   Title Walk in clinic with CGA/SBA with LRAD 500 feet.    Baseline Patient requires HHA - Min/ModA to ambulate safely.    Time 5    Period Weeks    Status On-going      PT LONG TERM GOAL #5   Title Improve Berg score by 10-12 points.    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 [place], [place], [address] Phone: [phone]   Fax:  [phone]  Name: SHAQUAN KABLE MRN: [id number] Date of Birth: 24-Feb-1996

## 2021-07-16 ENCOUNTER — Other Ambulatory Visit: Payer: Self-pay | Admitting: Family Medicine

## 2021-07-21 ENCOUNTER — Ambulatory Visit: Payer: Medicare Other | Admitting: *Deleted

## 2021-07-21 ENCOUNTER — Other Ambulatory Visit: Payer: Self-pay

## 2021-07-21 DIAGNOSIS — R2681 Unsteadiness on feet: Secondary | ICD-10-CM

## 2021-07-21 DIAGNOSIS — M6281 Muscle weakness (generalized): Secondary | ICD-10-CM | POA: Diagnosis not present

## 2021-07-21 DIAGNOSIS — R296 Repeated falls: Secondary | ICD-10-CM

## 2021-07-21 NOTE — Therapy (Signed)
[facility] [place], [place], [address] Phone: [phone]   Fax:  [phone]  Physical Therapy Treatment  Patient Details  Name: James Hoffman MRN: [id number] Date of Birth: 1996-04-14 Referring Provider (PT): Ronnie Doss, DO   Encounter Date: 07/21/2021   PT End of Session - 07/21/21 1529     Visit Number 9    Number of Visits 12    Date for PT Re-Evaluation 07/25/21    PT Start Time [id number]    PT Stop Time [id number]    PT Time Calculation (min) 49 min             Past Medical History:  Diagnosis Date   Adrenal insufficiency ([place])    Cancer ([place])    brain tumor on brain stem   Hydrocephalus ([place])    Osteoporosis    Thyroid disease     Past Surgical History:  Procedure Laterality Date   brain tumor removal  August 2012   EYE SURGERY Right 2014   cataract removed   HIP SURGERY     teeth removal      There were no vitals filed for this visit.   Subjective Assessment - 07/21/21 1528     Subjective Covid-19 screening performed before entering clinic . Patient arrived doing fair with energy and no LT knee pain    Patient is accompained by: Family member    Pertinent History History of cancer and chemo tx / hx of brain stem tumor    How long can you walk comfortably? ataxic - not independenet    Currently in Pain? No/denies                               [facility] Adult PT Treatment/Exercise - 07/21/21 0001       Ambulation/Gait   Ambulation/Gait Yes    Ambulation/Gait Assistance 6: Modified independent (Device/Increase time);4: Min guard    Ambulation Distance (Feet) 113 Feet    Assistive device Rolling walker    Gait Pattern Step-to pattern;Lateral hip instability;Step-through pattern      Lumbar Exercises: Aerobic   Nustep L4 x56mn UE/LE monitored      Lumbar Exercises: Seated   Other Seated Lumbar Exercises edge of mat with focus on erect posture while performing core  exs with RED  swiss ball    Other Seated Lumbar Exercises Standing with back to raised mat for LE support RED swiss ball reach outs. x10 hold 5 secs      Knee/Hip Exercises: Seated   Ball Squeeze 2x10 with 10 sec holds    Clamshell with [employer] Red   x20 focus on left LE   Sit to Sand 2 sets;with UE support;5 reps   at walker with focus on Pt activating glutes more to stand. Cues to use chair not walker                         PT Long Term Goals - 07/21/21 1530       PT LONG TERM GOAL #1   Title Patient will be modified independent with HEP and its progression with assistance    Baseline No knowledge of appropriate ther ex.    Time 3    Period Weeks    Status On-going      PT LONG TERM GOAL #2   Title Patient will demonstrate 5/5  bilateral UE and LE  MMT to improve stability during functional tasks.    Baseline Bilateral LE and UE strength grades from 3- to 4-/5.    Period Weeks    Status On-going      PT LONG TERM GOAL #3   Title Patient will perform sit to stands x5 with UE support and close supervision to improve transfers.    Time 3    Period Weeks    Status On-going      PT LONG TERM GOAL #4   Title Walk in clinic with CGA/SBA with LRAD 500 feet.    Period Weeks    Status On-going                   Plan - 07/21/21 1617     Clinical Impression Statement Pt arrived today doing fair and was able to walk loger today as well as perform standing exs. Also focused on glute and core engagement, but needs constant verbal and tactile cues. LTGs ongoing due to deficits    Personal Factors and Comorbidities Age;Behavior Pattern;Comorbidity 1;Comorbidity 2;Comorbidity 3+;Education;Past/Current Experience;Transportation;Time since onset of injury/illness/exacerbation;Social Background    Comorbidities Osteoporosis, Cancer, Thyroid Disease, Adrenal insufficiency, Gait instability    Examination-Participation Restrictions Driving;Medication  Management;Interpersonal Relationship;Occupation;[employer] Work    PT Treatment/Interventions ADLs/Self Care Home Management;Gait training;Therapeutic activities;Therapeutic exercise;Balance training;Patient/family education;Neuromuscular re-education;Orthotic Fit/Training;Wheelchair mobility training;Manual techniques;Passive range of motion    PT Next Visit Plan LE strength with MinA-at parrallel bar with gait belt ; nustep, transfer training, sit to stands progress as tolerated, core strength items, seated dynamic nbalance without back support    Consulted and Agree with Plan of Care Patient             Patient will benefit from skilled therapeutic intervention in order to improve the following deficits and impairments:  Abnormal gait, Decreased balance, Decreased endurance, Decreased mobility, Difficulty walking, Impaired vision/preception, Impaired tone, Impaired perceived functional ability, Decreased knowledge of precautions, Cardiopulmonary status limiting activity, Decreased cognition, Decreased activity tolerance, Decreased coordination, Decreased safety awareness, Decreased strength  Visit Diagnosis: Unsteadiness on feet  Muscle weakness (generalized)  Repeated falls     Problem List Patient Active Problem List   Diagnosis Date Noted   Abscess after procedure 06/12/2021   Abnormal chest x-ray 06/24/2020   Decreased lung sounds 06/24/2020   Wound check, abscess 05/20/2020   Depression, major, single episode, severe ([place]) 05/20/2020   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 [place], [place], [address] Phone: [phone]   Fax:  [phone]  Name: James Hoffman MRN: [id number] Date of Birth: 1996/10/26

## 2021-07-22 ENCOUNTER — Ambulatory Visit (INDEPENDENT_AMBULATORY_CARE_PROVIDER_SITE_OTHER): Payer: Medicare Other | Admitting: *Deleted

## 2021-07-22 ENCOUNTER — Encounter: Payer: Self-pay | Admitting: *Deleted

## 2021-07-22 DIAGNOSIS — F322 Major depressive disorder, single episode, severe without psychotic features: Secondary | ICD-10-CM

## 2021-07-22 DIAGNOSIS — C801 Malignant (primary) neoplasm, unspecified: Secondary | ICD-10-CM

## 2021-07-22 DIAGNOSIS — Z87898 Personal history of other specified conditions: Secondary | ICD-10-CM

## 2021-07-22 DIAGNOSIS — Z7409 Other reduced mobility: Secondary | ICD-10-CM

## 2021-07-22 DIAGNOSIS — E890 Postprocedural hypothyroidism: Secondary | ICD-10-CM

## 2021-07-22 DIAGNOSIS — R2681 Unsteadiness on feet: Secondary | ICD-10-CM

## 2021-07-26 ENCOUNTER — Ambulatory Visit: Payer: Medicare Other | Admitting: Physical Therapy

## 2021-07-26 ENCOUNTER — Encounter: Payer: Self-pay | Admitting: Physical Therapy

## 2021-07-26 ENCOUNTER — Other Ambulatory Visit: Payer: Self-pay

## 2021-07-26 DIAGNOSIS — R296 Repeated falls: Secondary | ICD-10-CM | POA: Diagnosis not present

## 2021-07-26 DIAGNOSIS — R2681 Unsteadiness on feet: Secondary | ICD-10-CM | POA: Diagnosis not present

## 2021-07-26 DIAGNOSIS — M6281 Muscle weakness (generalized): Secondary | ICD-10-CM

## 2021-07-26 NOTE — Therapy (Signed)
[facility] [place], [place], [address] Phone: [phone]   Fax:  [phone]  Physical Therapy Treatment  Patient Details  Name: James Hoffman MRN: [id number] Date of Birth: 1996-05-12 Referring Provider (PT): Ronnie Doss, DO   Encounter Date: 07/26/2021   PT End of Session - 07/26/21 1522     Visit Number 10    Number of Visits 12    Date for PT Re-Evaluation 07/25/21    PT Start Time [id number]    PT Stop Time 1558    PT Time Calculation (min) 42 min    Equipment Utilized During Treatment Other (comment);Gait belt   FWW   Activity Tolerance Patient tolerated treatment well;Patient limited by pain    Behavior During Therapy [facility] for tasks assessed/performed             Past Medical History:  Diagnosis Date   Adrenal insufficiency ([place])    Cancer ([place])    brain tumor on brain stem   Hydrocephalus ([place])    Osteoporosis    Thyroid disease     Past Surgical History:  Procedure Laterality Date   brain tumor removal  August 2012   EYE SURGERY Right 2014   cataract removed   HIP SURGERY     teeth removal      There were no vitals filed for this visit.   Subjective Assessment - 07/26/21 1521     Subjective Covid-19 screening performed before entering clinic . Patient reports that his legs are hurting some today.    Patient is accompained by: Family member   Caregiver, Courtney   Pertinent History History of cancer and chemo tx / hx of brain stem tumor    How long can you walk comfortably? ataxic - not independenet    Currently in Pain? Yes    Pain Score --   No pain score provided   Pain Location Leg    Pain Orientation Left;Right    Pain Descriptors / Indicators Discomfort    Pain Type Chronic pain    Pain Onset More than a month ago    Pain Frequency Intermittent                [facility] PT Assessment - 07/26/21 0001       Assessment   Medical Diagnosis Left Hip AVN; Mobility/endurance  deficit; hx of brain tumor    Referring Provider (PT) Ronnie Doss, DO    Onset Date/Surgical Date Apr 02, 1996    Hand Dominance Right    Next MD Visit TBD    Prior Therapy Yes      Precautions   Precautions Fall                           [place] Adult PT Treatment/Exercise - 07/26/21 0001       Ambulation/Gait   Ambulation/Gait Yes    Ambulation/Gait Assistance 6: Modified independent (Device/Increase time)    Ambulation Distance (Feet) 113 Feet    Assistive device Rolling walker    Gait Pattern Step-to pattern;Decreased step length - left;Decreased stance time - left;Decreased weight shift to left;Right foot flat;Left foot flat;Antalgic;Trunk flexed;Narrow base of support;Left flexed knee in stance      Lumbar Exercises: Aerobic   Nustep L6 x16 min      Knee/Hip Exercises: Seated   Long Arc Quad Strengthening;Both;10 reps    Ball Squeeze 2x10 with 10 sec holds    Clamshell with [employer]  Yellow   x2 0reps   Abduction/Adduction  AROM;Both;10 reps                          PT Long Term Goals - 07/21/21 1530       PT LONG TERM GOAL #1   Title Patient will be modified independent with HEP and its progression with assistance    Baseline No knowledge of appropriate ther ex.    Time 3    Period Weeks    Status On-going      PT LONG TERM GOAL #2   Title Patient will demonstrate 5/5  bilateral UE and LE MMT to improve stability during functional tasks.    Baseline Bilateral LE and UE strength grades from 3- to 4-/5.    Period Weeks    Status On-going      PT LONG TERM GOAL #3   Title Patient will perform sit to stands x5 with UE support and close supervision to improve transfers.    Time 3    Period Weeks    Status On-going      PT LONG TERM GOAL #4   Title Walk in clinic with CGA/SBA with LRAD 500 feet.    Period Weeks    Status On-going                   Plan - 07/26/21 1651     Clinical Impression Statement Patient  presented in clinic with reports of more pain in L hip and leg today. Patient required rest breaks throughout therex due fatigue and L hip pain. Patient experienced very frequent L hip popping with ambulation during stance phase. Patient required frequent redirection for activities.    Personal Factors and Comorbidities Age;Behavior Pattern;Comorbidity 1;Comorbidity 2;Comorbidity 3+;Education;Past/Current Experience;Transportation;Time since onset of injury/illness/exacerbation;Social Background    Comorbidities Osteoporosis, Cancer, Thyroid Disease, Adrenal insufficiency, Gait instability    Examination-Activity Limitations Bathing;Dressing;Locomotion Level;Squat;Stairs;Stand;Toileting;Transfers    Examination-Participation Restrictions Driving;Medication Management;Interpersonal Relationship;Occupation;Yard Work    Stability/Clinical Decision Making Unstable/Unpredictable    Rehab Potential Poor    PT Frequency 2x / week    PT Duration 6 weeks    PT Treatment/Interventions ADLs/Self Care Home Management;Gait training;Therapeutic activities;Therapeutic exercise;Balance training;Patient/family education;Neuromuscular re-education;Orthotic Fit/Training;Wheelchair mobility training;Manual techniques;Passive range of motion    PT Next Visit Plan LE strength with MinA-at parrallel bar with gait belt ; nustep, transfer training, sit to stands progress as tolerated, core strength items, seated dynamic nbalance without back support    PT Home Exercise Plan See instructions    Consulted and Agree with Plan of Care Patient    Family Member Rayne             Patient will benefit from skilled therapeutic intervention in order to improve the following deficits and impairments:  Abnormal gait, Decreased balance, Decreased endurance, Decreased mobility, Difficulty walking, Impaired vision/preception, Impaired tone, Impaired perceived functional ability, Decreased knowledge of precautions,  Cardiopulmonary status limiting activity, Decreased cognition, Decreased activity tolerance, Decreased coordination, Decreased safety awareness, Decreased strength  Visit Diagnosis: Unsteadiness on feet  Muscle weakness (generalized)  Repeated falls     Problem List Patient Active Problem List   Diagnosis Date Noted   Abscess after procedure 06/12/2021   Abnormal chest x-ray 06/24/2020   Decreased lung sounds 06/24/2020   Wound check, abscess 05/20/2020   Depression, major, single episode, severe ([place]) 05/20/2020   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 [place], [place], [address] Phone: [phone]   Fax:  [phone]  Name: SHLOMY PALACIO MRN: [id number] Date of Birth: September 29, 1996   Progress Note Reporting Period 06/13/21 to 07/26/21.  See note below for Objective Data and Assessment of Progress/Goals.  Goals remaining ongoing at this time.    Mali Applegate MPT

## 2021-07-28 ENCOUNTER — Encounter: Payer: Self-pay | Admitting: Physical Therapy

## 2021-07-28 ENCOUNTER — Other Ambulatory Visit: Payer: Self-pay

## 2021-07-28 ENCOUNTER — Ambulatory Visit: Payer: Medicare Other | Admitting: Physical Therapy

## 2021-07-28 DIAGNOSIS — R2681 Unsteadiness on feet: Secondary | ICD-10-CM | POA: Diagnosis not present

## 2021-07-28 DIAGNOSIS — R296 Repeated falls: Secondary | ICD-10-CM

## 2021-07-28 DIAGNOSIS — M6281 Muscle weakness (generalized): Secondary | ICD-10-CM

## 2021-07-28 NOTE — Therapy (Signed)
[facility] [place], [place], [address] Phone: [phone]   Fax:  [phone]  Physical Therapy Treatment  Patient Details  Name: James Hoffman MRN: [id number] Date of Birth: 1996-03-14 Referring Provider (PT): Ronnie Doss, DO   Encounter Date: 07/28/2021   PT End of Session - 07/28/21 1517     Visit Number 11    Number of Visits 12    Date for PT Re-Evaluation 07/25/21    PT Start Time 1518    PT Stop Time 1545    PT Time Calculation (min) 27 min    Equipment Utilized During Treatment Other (comment);Gait belt   FWW   Activity Tolerance Patient tolerated treatment well;Patient limited by pain    Behavior During Therapy [facility] for tasks assessed/performed             Past Medical History:  Diagnosis Date   Adrenal insufficiency ([place])    Cancer ([place])    brain tumor on brain stem   Hydrocephalus ([place])    Osteoporosis    Thyroid disease     Past Surgical History:  Procedure Laterality Date   brain tumor removal  August 2012   EYE SURGERY Right 2014   cataract removed   HIP SURGERY     teeth removal      There were no vitals filed for this visit.   Subjective Assessment - 07/28/21 1516     Subjective Covid-19 screening performed before entering clinic . Patient reports that his legs are hurting some today.    Pertinent History History of cancer and chemo tx / hx of brain stem tumor    How long can you walk comfortably? ataxic - not independenet    Currently in Pain? Yes   No full pain assessment provided               [facility] PT Assessment - 07/28/21 0001       Assessment   Medical Diagnosis Left Hip AVN; Mobility/endurance deficit; hx of brain tumor    Referring Provider (PT) Ronnie Doss, DO    Onset Date/Surgical Date 05-22-96    Hand Dominance Right    Next MD Visit TBD    Prior Therapy Yes      Precautions   Precautions Fall                            [place] Adult PT Treatment/Exercise - 07/28/21 0001       Ambulation/Gait   Ambulation/Gait Yes    Ambulation/Gait Assistance 4: Min assist;Other (comment)   assist with control of walker and contact of gait belt   Ambulation Distance (Feet) 8 Feet    Assistive device Rolling walker    Gait Pattern Step-to pattern;Decreased stride length;Decreased stance time - left;Decreased weight shift to left;Left foot flat;Antalgic;Narrow base of support    Ambulation Surface Level;Indoor      Knee/Hip Exercises: Aerobic   Nustep L5-L1 x15 min   Extended rest break due to fatigue and L knee pain                         PT Long Term Goals - 07/21/21 1530       PT LONG TERM GOAL #1   Title Patient will be modified independent with HEP and its progression with assistance    Baseline No knowledge of appropriate ther ex.    Time 3  Period Weeks    Status On-going      PT LONG TERM GOAL #2   Title Patient will demonstrate 5/5  bilateral UE and LE MMT to improve stability during functional tasks.    Baseline Bilateral LE and UE strength grades from 3- to 4-/5.    Period Weeks    Status On-going      PT LONG TERM GOAL #3   Title Patient will perform sit to stands x5 with UE support and close supervision to improve transfers.    Time 3    Period Weeks    Status On-going      PT LONG TERM GOAL #4   Title Walk in clinic with CGA/SBA with LRAD 500 feet.    Period Weeks    Status On-going                   Plan - 07/28/21 1658     Clinical Impression Statement Patient presented in clinic with reports of increased LLE pain. Patient able to complete Nustep session but required extended rest breaks due to fatigue and L knee pain. Patient reported feeling "hot" as well and provided water during rest break. An attempt to ambulate within clinic was made but patient unable to make it more than 8 ft due to L knee pain and feeling like "my knee is broke." Upon examination of LLE,  no abnormal coloration or temperature was noted but patient reported tenderness with calf squeeze and increased tightness in L personeals and gastroc region. Patient's grandmother was in the waiting room and she was consulted regarding his pain to which she stated that he was complaining of prior to today's appointment. Patient's grandmother was told to monitor symptoms and if worsened to seek medical help if needed.    Personal Factors and Comorbidities Age;Behavior Pattern;Comorbidity 1;Comorbidity 2;Comorbidity 3+;Education;Past/Current Experience;Transportation;Time since onset of injury/illness/exacerbation;Social Background    Comorbidities Osteoporosis, Cancer, Thyroid Disease, Adrenal insufficiency, Gait instability    Examination-Activity Limitations Bathing;Dressing;Locomotion Level;Squat;Stairs;Stand;Toileting;Transfers    Examination-Participation Restrictions Driving;Medication Management;Interpersonal Relationship;Occupation;Yard Work    Stability/Clinical Decision Making Unstable/Unpredictable    Rehab Potential Poor    PT Frequency 2x / week    PT Duration 6 weeks    PT Treatment/Interventions ADLs/Self Care Home Management;Gait training;Therapeutic activities;Therapeutic exercise;Balance training;Patient/family education;Neuromuscular re-education;Orthotic Fit/Training;Wheelchair mobility training;Manual techniques;Passive range of motion    PT Next Visit Plan LE strength with MinA-at parrallel bar with gait belt ; nustep, transfer training, sit to stands progress as tolerated, core strength items, seated dynamic nbalance without back support    PT Home Exercise Plan See instructions    Consulted and Agree with Plan of Care Patient;Family member/caregiver    Family Member Consulted Grandmother, [name]             Patient will benefit from skilled therapeutic intervention in order to improve the following deficits and impairments:  Abnormal gait, Decreased balance,  Decreased endurance, Decreased mobility, Difficulty walking, Impaired vision/preception, Impaired tone, Impaired perceived functional ability, Decreased knowledge of precautions, Cardiopulmonary status limiting activity, Decreased cognition, Decreased activity tolerance, Decreased coordination, Decreased safety awareness, Decreased strength  Visit Diagnosis: Unsteadiness on feet  Muscle weakness (generalized)  Repeated falls     Problem List Patient Active Problem List   Diagnosis Date Noted   Abscess after procedure 06/12/2021   Abnormal chest x-ray 06/24/2020   Decreased lung sounds 06/24/2020   Wound check, abscess 05/20/2020   Depression, major, single episode, severe ([place]) 05/20/2020   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[place])  12/09/2019   Blindness and low vision 10/29/2019   History of eye surgery 10/29/2019   History of brain tumor 10/29/2019   Gait instability 12/19/2016   Hypogonadism, male 07/19/2016   Normocytic anemia 04/14/2016   Muscular deconditioning 09/17/2015   Hypothyroidism 09/09/2015   Medullary carcinoma ([place]) 09/09/2015   Adrenal insufficiency ([place]) 09/09/2015   Hypotension 09/09/2015   Hyponatremia 09/09/2015   Hypokalemia 09/09/2015    Standley Brooking, PTA 07/28/2021, 5:05 PM  [facility] [address] [place], [place], [address] Phone: [phone]   Fax:  [phone]  Name: MIRO MARSMAN MRN: [id number] Date of Birth: Dec 25, 1995

## 2021-08-02 ENCOUNTER — Ambulatory Visit: Payer: Medicare Other | Admitting: *Deleted

## 2021-08-03 ENCOUNTER — Other Ambulatory Visit: Payer: Self-pay | Admitting: Family Medicine

## 2021-08-03 DIAGNOSIS — F419 Anxiety disorder, unspecified: Secondary | ICD-10-CM

## 2021-08-04 ENCOUNTER — Encounter: Payer: Medicare Other | Admitting: *Deleted

## 2021-08-05 DIAGNOSIS — E230 Hypopituitarism: Secondary | ICD-10-CM | POA: Diagnosis not present

## 2021-08-05 DIAGNOSIS — Z125 Encounter for screening for malignant neoplasm of prostate: Secondary | ICD-10-CM | POA: Diagnosis not present

## 2021-08-05 DIAGNOSIS — E038 Other specified hypothyroidism: Secondary | ICD-10-CM | POA: Diagnosis not present

## 2021-08-05 DIAGNOSIS — E2749 Other adrenocortical insufficiency: Secondary | ICD-10-CM | POA: Diagnosis not present

## 2021-08-09 ENCOUNTER — Other Ambulatory Visit: Payer: Self-pay

## 2021-08-09 ENCOUNTER — Ambulatory Visit: Payer: Medicare Other | Admitting: *Deleted

## 2021-08-09 DIAGNOSIS — R296 Repeated falls: Secondary | ICD-10-CM

## 2021-08-09 DIAGNOSIS — M6281 Muscle weakness (generalized): Secondary | ICD-10-CM | POA: Diagnosis not present

## 2021-08-09 DIAGNOSIS — R2681 Unsteadiness on feet: Secondary | ICD-10-CM

## 2021-08-09 NOTE — Therapy (Signed)
[facility] [place], [place], [address] Phone: [phone]   Fax:  [phone]  Physical Therapy Treatment  Patient Details  Name: James Hoffman MRN: [id number] Date of Birth: 1996-06-10 Referring Provider (PT): Ronnie Doss, DO   Encounter Date: 08/09/2021   PT End of Session - 08/09/21 1526     Visit Number 12    Number of Visits 12    Date for PT Re-Evaluation 07/25/21    PT Start Time 2505    PT Stop Time 1605    PT Time Calculation (min) 47 min             Past Medical History:  Diagnosis Date   Adrenal insufficiency ([place])    Cancer ([place])    brain tumor on brain stem   Hydrocephalus ([place])    Osteoporosis    Thyroid disease     Past Surgical History:  Procedure Laterality Date   brain tumor removal  August 2012   EYE SURGERY Right 2014   cataract removed   HIP SURGERY     teeth removal      There were no vitals filed for this visit.   Subjective Assessment - 08/09/21 1531     Subjective Covid-19 screening performed before entering clinic . Patient reports that his LT knee hurts a lot today on top.    Pertinent History History of cancer and chemo tx / hx of brain stem tumor    How long can you walk comfortably? ataxic - not independenet    Pain Location Leg    Pain Orientation Right;Left    Pain Descriptors / Indicators Aching;Sore    Pain Type Chronic pain    Pain Onset More than a month ago                               [facility] Adult PT Treatment/Exercise - 08/09/21 0001       Ambulation/Gait   Ambulation/Gait Yes    Ambulation Distance (Feet) 5 Feet    Assistive device Rolling walker    Ambulation Surface Level;Indoor    Gait Comments Unable to perform more than a few stps today  due to LT knee pain      Lumbar Exercises: Aerobic   Nustep L3 x 15 mins , but with LT knee pain      Knee/Hip Exercises: Seated   Long Arc Quad Strengthening;Both;10 reps     Ball Squeeze 2x10 with 10 sec holds    Clamshell with TheraBand Red   x20 focus on left LE   Sit to Sand with UE support;1 set;5 reps   at walker , but unable to perform due to pain                         PT Long Term Goals - 08/09/21 1528       PT LONG TERM GOAL #1   Title Patient will be modified independent with HEP and its progression with assistance    Period Weeks    Status Not Met      PT LONG TERM GOAL #2   Title Patient will demonstrate 5/5  bilateral UE and LE MMT to improve stability during functional tasks.    Baseline Bilateral LE and UE strength grades from 3- to 4-/5.    Period Weeks    Status On-going      PT  LONG TERM GOAL #3   Title Patient will perform sit to stands x5 with UE support and close supervision to improve transfers.    Period Weeks    Status Not Met      PT LONG TERM GOAL #4   Title Walk in clinic with CGA/SBA with LRAD 500 feet.    Time 5    Period Weeks    Status On-going      PT LONG TERM GOAL #5   Title Improve Berg score by 10-12 points.    Baseline TBA    Period Weeks    Status Unable to assess                   Plan - 08/09/21 1527     Clinical Impression Statement Pt arrived today c/o LT knee/quad tendon pain. He was able to complete some of the exs and was instrructed in exs to continue at home at this time. HEP handout and red tband were given to him and family member. Pt was unble to meet LTGs due to weakness and LT knee pain. Family member reported that she would set up an appt to have LT knee assessed by MD.    Personal Factors and Comorbidities Age;Behavior Pattern;Comorbidity 1;Comorbidity 2;Comorbidity 3+;Education;Past/Current Experience;Transportation;Time since onset of injury/illness/exacerbation;Social Background    Comorbidities Osteoporosis, Cancer, Thyroid Disease, Adrenal insufficiency, Gait instability    Examination-Activity Limitations Bathing;Dressing;Locomotion  Level;Squat;Stairs;Stand;Toileting;Transfers    Stability/Clinical Decision Making Unstable/Unpredictable    Rehab Potential Poor    PT Frequency 2x / week    PT Duration 6 weeks    PT Treatment/Interventions ADLs/Self Care Home Management;Gait training;Therapeutic activities;Therapeutic exercise;Balance training;Patient/family education;Neuromuscular re-education;Orthotic Fit/Training;Wheelchair mobility training;Manual techniques;Passive range of motion    PT Next Visit Plan LE strength with MinA-at parrallel bar with gait belt ; nustep, transfer training, sit to stands progress as tolerated, core strength items, seated dynamic nbalance without back support       Pt on hold at this time    PT Home Exercise Plan See instructions    Consulted and Agree with Plan of Care Patient;Family member/caregiver    Family Member Consulted Grandmother, [name]             Patient will benefit from skilled therapeutic intervention in order to improve the following deficits and impairments:  Abnormal gait, Decreased balance, Decreased endurance, Decreased mobility, Difficulty walking, Impaired vision/preception, Impaired tone, Impaired perceived functional ability, Decreased knowledge of precautions, Cardiopulmonary status limiting activity, Decreased cognition, Decreased activity tolerance, Decreased coordination, Decreased safety awareness, Decreased strength  Visit Diagnosis: Unsteadiness on feet  Muscle weakness (generalized)  Repeated falls     Problem List Patient Active Problem List   Diagnosis Date Noted   Abscess after procedure 06/12/2021   Abnormal chest x-ray 06/24/2020   Decreased lung sounds 06/24/2020   Wound check, abscess 05/20/2020   Depression, major, single episode, severe ([place]) 05/20/2020   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 [place], [place], [address] Phone: [phone]  Fax:  [phone]  Name: James Hoffman MRN: [id number] Date of Birth: 1995-12-18

## 2021-08-10 ENCOUNTER — Telehealth: Payer: Medicare Other

## 2021-08-11 ENCOUNTER — Encounter: Payer: Medicare Other | Admitting: *Deleted

## 2021-08-11 ENCOUNTER — Telehealth: Payer: Self-pay | Admitting: Family Medicine

## 2021-08-11 NOTE — Telephone Encounter (Signed)
Pt needs new referral for physical therapy. [facility] to [facility]. The referral he had expired on 08/09/2021.

## 2021-08-12 ENCOUNTER — Other Ambulatory Visit: Payer: Self-pay | Admitting: Family Medicine

## 2021-08-12 DIAGNOSIS — Z87898 Personal history of other specified conditions: Secondary | ICD-10-CM

## 2021-08-12 DIAGNOSIS — M87052 Idiopathic aseptic necrosis of left femur: Secondary | ICD-10-CM

## 2021-08-12 DIAGNOSIS — F322 Major depressive disorder, single episode, severe without psychotic features: Secondary | ICD-10-CM | POA: Diagnosis not present

## 2021-08-12 DIAGNOSIS — Z7409 Other reduced mobility: Secondary | ICD-10-CM

## 2021-08-12 DIAGNOSIS — E890 Postprocedural hypothyroidism: Secondary | ICD-10-CM | POA: Diagnosis not present

## 2021-08-12 NOTE — Telephone Encounter (Signed)
done

## 2021-08-16 DIAGNOSIS — S83402A Sprain of unspecified collateral ligament of left knee, initial encounter: Secondary | ICD-10-CM | POA: Diagnosis not present

## 2021-08-16 DIAGNOSIS — S8002XA Contusion of left knee, initial encounter: Secondary | ICD-10-CM | POA: Diagnosis not present

## 2021-08-16 DIAGNOSIS — M25562 Pain in left knee: Secondary | ICD-10-CM | POA: Diagnosis not present

## 2021-08-16 DIAGNOSIS — M1712 Unilateral primary osteoarthritis, left knee: Secondary | ICD-10-CM | POA: Diagnosis not present

## 2021-08-17 ENCOUNTER — Other Ambulatory Visit: Payer: Self-pay

## 2021-08-17 ENCOUNTER — Ambulatory Visit (INDEPENDENT_AMBULATORY_CARE_PROVIDER_SITE_OTHER): Payer: Medicare Other | Admitting: Family Medicine

## 2021-08-17 ENCOUNTER — Encounter: Payer: Self-pay | Admitting: Family Medicine

## 2021-08-17 ENCOUNTER — Ambulatory Visit (INDEPENDENT_AMBULATORY_CARE_PROVIDER_SITE_OTHER): Payer: Medicare Other

## 2021-08-17 VITALS — BP 121/84 | HR 118 | Temp 97.5°F | Ht 70.0 in | Wt 137.0 lb

## 2021-08-17 DIAGNOSIS — M25562 Pain in left knee: Secondary | ICD-10-CM

## 2021-08-17 DIAGNOSIS — Z23 Encounter for immunization: Secondary | ICD-10-CM

## 2021-08-17 DIAGNOSIS — M25462 Effusion, left knee: Secondary | ICD-10-CM | POA: Diagnosis not present

## 2021-08-17 MED ORDER — IBUPROFEN 600 MG PO TABS: 600.0000 mg | ORAL_TABLET | Freq: Three times a day (TID) | ORAL | 1 refills | Status: DC | PRN

## 2021-08-17 NOTE — Progress Notes (Signed)
Subjective: CC: Knee pain PCP: James Norlander, DO [id number] [name] is a 25 y.o. male presenting to clinic today for:  1.  Left knee pain Patient is brought to the office by his family member who notes that he had abrupt onset of left-sided knee pain after kicking out during physical therapy.  He sought care in the urgent care office and was given a Toradol injection.  Unfortunately, there was no x-ray available and therefore they presented today for further evaluation.  For about a week he has been having sensation of instability of the left knee where he almost falls.  He has been evaluated previously by orthopedics for that left knee as he has known avascular necrosis of the left hip and is trying to work towards surgical intervention for that through physical therapy.  He was given corticosteroid injection in July but James Hoffman reports that he did not really respond to that.  He takes naproxen up to twice daily but again this has not been especially helpful.   ROS: Per HPI  Allergies  Allergen Reactions   Tramadol Anaphylaxis   Morphine Nausea And Vomiting    According to mother the patient is intolerant of morphine; causes nausea and vomiting.  Has had none since he was 25yo.   Past Medical History:  Diagnosis Date   Adrenal insufficiency ([place])    Cancer ([place])    brain tumor on brain stem   Hydrocephalus ([place])    Osteoporosis    Thyroid disease     Current Outpatient Medications:    atorvastatin (LIPITOR) 40 MG tablet, , Disp: , Rfl:    busPIRone (BUSPAR) 5 MG tablet, Take 1 tablet (5 mg total) by mouth 3 (three) times daily. For anxiety (NEEDS TO BE SEEN BEFORE NEXT REFILL), Disp: 90 tablet, Rfl: 0   cetirizine (ZYRTEC) 10 MG tablet, Take 1 tablet (10 mg total) by mouth daily., Disp: 30 tablet, Rfl: 11   Cholecalciferol 50 MCG (2000 UT) CAPS, Take by mouth., Disp: , Rfl:    dapsone 100 MG tablet, Take 1 tablet (100 mg total) by mouth daily., Disp: 10 tablet, Rfl:  0   escitalopram (LEXAPRO) 10 MG tablet, Take 1 tablet (10 mg total) by mouth daily., Disp: 90 tablet, Rfl: 3   fluconazole (DIFLUCAN) 100 MG tablet, Take two with first dose. Then starting the next day take one daily until all are taken., Disp: 15 tablet, Rfl: 0   fluticasone (FLONASE) 50 MCG/ACT nasal spray, Place 2 sprays into both nostrils daily., Disp: 16 g, Rfl: 6   fluticasone (FLOVENT HFA) 44 MCG/ACT inhaler, Inhale 2 puffs into the lungs 2 (two) times daily., Disp: 1 each, Rfl: 12   levETIRAcetam (KEPPRA) 750 MG tablet, Take 1,500 mg by mouth in the morning and at bedtime., Disp: , Rfl:    levothyroxine (SYNTHROID) 112 MCG tablet, Take 1 tablet by mouth daily., Disp: , Rfl:    naproxen (NAPROSYN) 500 MG tablet, TAKE 1 TABLET (500 MG TOTAL) BY MOUTH 2 (TWO) TIMES DAILY AS NEEDED FOR MODERATE PAIN., Disp: 60 tablet, Rfl: 0   omeprazole (PRILOSEC) 20 MG capsule, Take 1 capsule (20 mg total) by mouth daily. At lunch time., Disp: 30 capsule, Rfl: 6   Spacer/Aero-Holding Chambers (AEROCHAMBER PLUS) inhaler, Use as instructed, Disp: 1 each, Rfl: 2   Testosterone 20.25 MG/ACT (1.62%) GEL, ONE PUM ON EACH SHOULDER EVERY MORNIG, Disp: 75 g, Rfl: 1   thiamine 100 MG tablet, Take by mouth., Disp: , Rfl:  VENTOLIN HFA 108 (90 Base) MCG/ACT inhaler, TAKE 2 PUFFS BY MOUTH EVERY 6 HOURS AS NEEDED FOR WHEEZE OR SHORTNESS OF BREATH, Disp: 18 each, Rfl: 1   clotrimazole-betamethasone (LOTRISONE) cream, Apply 1 application topically 2 (two) times daily. x7-10 (Patient not taking: Reported on 08/17/2021), Disp: 30 g, Rfl: 0 Social History   Socioeconomic History   Marital status: Single    Spouse name: Not on file   Number of children: 0   Years of education: 7th grade   Highest education level: 7th grade  Occupational History   Occupation: disabled  Tobacco Use   Smoking status: Every Day    Packs/day: 0.25    Years: 6.00    Pack years: 1.50    Types: Cigarettes    Start date: 11/25/2012    Smokeless tobacco: Never  Vaping Use   Vaping Use: Never used  Substance and Sexual Activity   Alcohol use: No   Drug use: No   Sexual activity: Not Currently  Other Topics Concern   Not on file  Social History Narrative   Not on file   Social Determinants of Health   Financial Resource Strain: Not on file  Food Insecurity: Not on file  Transportation Needs: Not on file  Physical Activity: Insufficiently Active   Days of Exercise per Week: 2 days   Minutes of Exercise per Session: 10 min  Stress: Not on file  Social Connections: Not on file  Intimate Partner Violence: Not on file   History reviewed. No pertinent family history.  Objective: Office vital signs reviewed. BP 121/84   Pulse (!) 118   Temp (!) 97.5 F (36.4 C)   Ht 5\' 10"  (1.778 m)   Wt 137 lb (62.1 kg)   SpO2 94%   BMI 19.66 kg/m   Physical Examination:  General: Awake, alert, malnourished, No acute distress MSK: Arrives in wheelchair  Left knee: He has exquisite tenderness palpation throughout the entire knee.  He does have mild swelling but no significant erythema.  Mild ecchymosis noted along the medial aspect of the knee.  He is able to extend and flex knee but reports discomfort with this.  Assessment/ Plan: 25 y.o. male   Pain and swelling of left knee - Plan: ibuprofen (ADVIL) 600 MG tablet, DG Knee 1-2 Views Left  I reviewed both his most recent orthopedist note, urgent care note from yesterday.  Patient was tender to out of proportion to exam today.  He did have mild ecchymosis noted along the medial aspect of the left knee.  Given chronic steroid use I have ordered a plain film of the knee to rule out any fracture.  I worry that he may have had a meniscal injury given reports of instability of the knee, swelling.  I have recommended highly that he follow-up with his orthopedist for further evaluation management.  In the interim I placed him on oral NSAID, recommended ice, rest and compression as  tolerated.  No orders of the defined types were placed in this encounter.  No orders of the defined types were placed in this encounter.    James Norlander, DO [place] [phone]

## 2021-08-17 NOTE — Patient Instructions (Addendum)
Please schedule a visit with your orthopedic doctor for follow up on your knee.  They gave you a shot in it in July.   [facility]   [address]   [place], [place] [address]   Phone: [phone]   Fax: [phone]     You have prescribed a nonsteroidal anti-inflammatory drug (NSAID) today. This will help with your pain and inflammation. Please do not take any other NSAIDs (ibuprofen/Motrin/Advil, naproxen/Aleve, meloxicam/Mobic, Voltaren/diclofenac). Please make sure to eat a meal when taking this medication.   Caution: If you have a history of acid reflux/indigestion, I recommend that you take an antacid (such as Prilosec, Prevacid) daily while on the NSAID. If you have a history of bleeding disorder, gastric ulcer, are on a blood thinner (like warfarin/Coumadin, Xarelto, Eliquis, etc) please do not take NSAID. If you have ever had a heart attack, you should not take NSAIDs.  Acute Knee Pain, Adult Many things can cause knee pain. Sometimes, knee pain is sudden (acute) and may be caused by damage, swelling, or irritation of the muscles and tissues that support your knee. The pain often goes away on its own with time and rest. If the pain does not go away, tests may be done to find out what is causing the pain. Follow these instructions at home: If you have a knee sleeve or brace:  Wear the knee sleeve or brace as told by your doctor. Take it off only as told by your doctor. Loosen it if your toes: Tingle. Become numb. Turn cold and blue. Keep it clean. If the knee sleeve or brace is not waterproof: Do not let it get wet. Cover it with a watertight covering when you take a bath or shower. Activity Rest your knee. Do not do things that cause pain or make pain worse. Avoid activities where both feet leave the ground at the same time (high-impact activities). Examples are running, jumping rope, and doing jumping jacks. Work with a physical therapist to make a safe  exercise program, as told by your doctor. Managing pain, stiffness, and swelling  If told, put ice on the knee. To do this: If you have a removable knee sleeve or brace, take it off as told by your doctor. Put ice in a plastic bag. Place a towel between your skin and the bag. Leave the ice on for 20 minutes, 2-3 times a day. Take off the ice if your skin turns bright red. This is very important. If you cannot feel pain, heat, or cold, you have a greater risk of damage to the area. If told, use an elastic bandage to put pressure (compression) on your injured knee. Raise your knee above the level of your heart while you are sitting or lying down. Sleep with a pillow under your knee. General instructions Take over-the-counter and prescription medicines only as told by your doctor. Do not smoke or use any products that contain nicotine or tobacco. If you need help quitting, ask your doctor. If you are overweight, work with your doctor and a food expert (dietitian) to set goals to lose weight. Being overweight can make your knee hurt more. Watch for any changes in your symptoms. Keep all follow-up visits. Contact a doctor if: The knee pain does not stop. The knee pain changes or gets worse. You have a fever along with knee pain. Your knee is red or feels warm when you touch it. Your knee gives out or locks up. Get help right  away if: Your knee swells, and the swelling gets worse. You cannot move your knee. You have very bad knee pain that does not get better with pain medicine. Summary Many things can cause knee pain. The pain often goes away on its own with time and rest. Your doctor may do tests to find out the cause of the pain. Watch for any changes in your symptoms. Relieve your pain with rest, medicines, light activity, and use of ice. Get help right away if you cannot move your knee or your knee pain is very bad. This information is not intended to replace advice given to you by  your health care provider. Make sure you discuss any questions you have with your health care provider. Document Revised: 04/14/2020 Document Reviewed: 04/14/2020 Elsevier Patient Education  2022 [employer].

## 2021-08-18 NOTE — Chronic Care Management (AMB) (Signed)
Chronic Care Management   CCM RN Visit Note  07/22/2021 Name: James Hoffman MRN: [id number] DOB: 14-May-1996  Subjective: James Hoffman is a 25 y.o. year old male who is a primary care patient of [name], DO. The care management team was consulted for assistance with disease management and care coordination needs.    Engaged with patient's grandmother and caregiver, [name], by telephone  for follow up visit in response to provider referral for case management and/or care coordination services.   Consent to Services:  The patient was given information about Chronic Care Management services, agreed to services, and gave verbal consent prior to initiation of services.  Please see initial visit note for detailed documentation.   Patient agreed to services and verbal consent obtained.   Assessment: Review of patient past medical history, allergies, medications, health status, including review of consultants reports, laboratory and other test data, was performed as part of comprehensive evaluation and provision of chronic care management services.   SDOH (Social Determinants of Health) assessments and interventions performed:    CCM Care Plan  Allergies  Allergen Reactions   Tramadol Anaphylaxis   Morphine Nausea And Vomiting    According to mother the patient is intolerant of morphine; causes nausea and vomiting.  Has had none since he was 25yo.    Outpatient Encounter Medications as of 07/22/2021  Medication Sig   atorvastatin (LIPITOR) 40 MG tablet    cetirizine (ZYRTEC) 10 MG tablet Take 1 tablet (10 mg total) by mouth daily.   Cholecalciferol 50 MCG (2000 UT) CAPS Take by mouth.   clotrimazole-betamethasone (LOTRISONE) cream Apply 1 application topically 2 (two) times daily. x7-10 (Patient not taking: Reported on 08/17/2021)   dapsone 100 MG tablet Take 1 tablet (100 mg total) by mouth daily.   escitalopram (LEXAPRO) 10 MG tablet Take 1 tablet (10 mg total) by  mouth daily.   fluconazole (DIFLUCAN) 100 MG tablet Take two with first dose. Then starting the next day take one daily until all are taken.   fluticasone (FLONASE) 50 MCG/ACT nasal spray Place 2 sprays into both nostrils daily.   fluticasone (FLOVENT HFA) 44 MCG/ACT inhaler Inhale 2 puffs into the lungs 2 (two) times daily.   levETIRAcetam (KEPPRA) 750 MG tablet Take 1,500 mg by mouth in the morning and at bedtime.   levothyroxine (SYNTHROID) 112 MCG tablet Take 1 tablet by mouth daily.   omeprazole (PRILOSEC) 20 MG capsule Take 1 capsule (20 mg total) by mouth daily. At lunch time.   Spacer/Aero-Holding Chambers (AEROCHAMBER PLUS) inhaler Use as instructed   Testosterone 20.25 MG/ACT (1.62%) GEL ONE PUM ON EACH SHOULDER EVERY MORNIG   thiamine 100 MG tablet Take by mouth.   VENTOLIN HFA 108 (90 Base) MCG/ACT inhaler TAKE 2 PUFFS BY MOUTH EVERY 6 HOURS AS NEEDED FOR WHEEZE OR SHORTNESS OF BREATH   [DISCONTINUED] benzonatate (TESSALON) 200 MG capsule Take 1 capsule (200 mg total) by mouth 3 (three) times daily as needed.   [DISCONTINUED] busPIRone (BUSPAR) 5 MG tablet TAKE 1 TABLET (5 MG TOTAL) BY MOUTH 3 (THREE) TIMES DAILY. FOR ANXIETY   [DISCONTINUED] naproxen (NAPROSYN) 500 MG tablet TAKE 1 TABLET (500 MG TOTAL) BY MOUTH 2 (TWO) TIMES DAILY AS NEEDED FOR MODERATE PAIN.   No facility-administered encounter medications on file as of 07/22/2021.    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   Blindness and low vision 10/29/2019   History of eye surgery 10/29/2019   History of brain tumor 10/29/2019   Gait instability 12/19/2016   Hypogonadism, male 07/19/2016   Normocytic anemia 04/14/2016   Muscular deconditioning 09/17/2015   Hypothyroidism  09/09/2015   Medullary carcinoma ([place]) 09/09/2015   Adrenal insufficiency ([place]) 09/09/2015   Hypotension 09/09/2015   Hyponatremia 09/09/2015   Hypokalemia 09/09/2015    Conditions to be addressed/monitored: hypothyroidism, depression, limited mobility and history of medullary carcinoma  Care Plan : [facility] Care Plan   Problem: Chronic Disease Management Needs   Priority: Medium     Long-Range Goal: Work with RN Care Manager Regarding Care Coordination and Care Management Associated with Chronic Medical Conditions   This Visit's Progress: On track  Priority: Medium  Note:   Current Barriers:  Chronic Disease Management support and education needs related to limited mobility, hypothyroidism, depression, Hydrocephalus, hx of medullary carcinoma  RNCM Clinical Goal(s):  Patient will continue to work with RN Care Manager to address care management and care coordination needs related to limited mobility, hypothyroidism, Hydrocephalus, hx of medullary carcinoma  through collaboration with RN Care manager, provider, and care team.  Patient will continue to work with LCSW regarding Level of Care Concerns and psychosocial concerns  Interventions: 1:1 collaboration with primary care provider regarding development and update of comprehensive plan of care as evidenced by provider attestation and co-signature Inter-disciplinary care team collaboration (see longitudinal plan of care) Evaluation of current treatment plan related to  self management and patient's adherence to plan as established by provider Discussed plans with patient for ongoing care management follow up and provided patient with direct contact information for care management team Provided with RNCM contact information and encouraged to reach out as needed   Falls:  (Status: Goal on track: YES.) Reviewed medications and discussed potential side effects of medications such as dizziness and frequent urination Advised patient of  importance of notifying provider of falls Assessed for signs and symptoms of orthostatic hypotension Assessed for falls since last encounter Assessed patients knowledge of fall risk prevention secondary to previously provided education Discussed limited mobility and limited ability to perform ADLs Discussed assistive devices. Primary uses a hospital bed. Can walk short distances with assistance. Needs assistance with transfers.  Discussed family/social support Primarily lives with grandmother but has assistance from aunt as well  Limited Mobility:  (Status: Goal on track: YES.) Discussed use of assistive devices: hospital bed, wheelchair Assessed mobility. Can ambulate with assistance for short distances Discussed physical therapy Encouraged to change positions at least every 2 hours to decreaes risk for pressure sore development Therapuetic listening utilized regarding current and past state of health related to hydrocephalus and hx of brain tumor Assessed for incontinence. He's no longer wetting the bed at night.   Oncology:  (Status: Goal on track: YES.) Discussed history of medullary carcinoma and latent effects Nutrition assessment performed. Appetite is much improved. Grandmother is pleased with how well he's eating.  Discussed upcoming appointments Encouraged to reach out to PCP with any new or worsening sypmtoms  Patient Goals/Self-Care Activities: Patient will self administer medications as prescribed as evidenced by self report/primary caregiver report  Patient will attend all scheduled provider appointments as evidenced by clinician review of documented attendance to scheduled appointments and patient/caregiver report       Plan:Telephone follow up appointment with care management  team member scheduled for:  10/18/21 with RNCM and The patient has been provided with contact information for the care management team and has been advised to call with any health related questions  or concerns.   Chong Sicilian, BSN, RN-BC Embedded Chronic Care Manager [facility] / [place] Management Direct Dial: [phone]

## 2021-08-18 NOTE — Patient Instructions (Signed)
Visit Information  PATIENT GOALS:  Goals Addressed             This Visit's Progress    RNCM: Chronic Disease Management Needs   On track    Timeframe:  Long-Range Goal Priority:  Medium Start Date:  07/22/21                           Expected End Date:                       Follow-up: 10/18/21  Continue to eat a balanced diet Call PCP with any new or worsening symptoms Notify PCP of any decrease in appetite or weight loss Increase activity level as tolerated Change positions at least every 2 hours to prevent pressure sore development Talk with LCSW regarding depression, anxiety, or other psychosocial concerns Keep all medical appointments Call [facility] as needed [phone]          Patient verbalizes understanding of instructions provided today and agrees to view in [place].   Plan:Telephone follow up appointment with care management team member scheduled for:  10/18/21 with RNCM and The patient has been provided with contact information for the care management team and has been advised to call with any health related questions or concerns.   Chong Sicilian, BSN, RN-BC Embedded Chronic Care Manager [facility] / [place] Management Direct Dial: [phone]

## 2021-08-25 ENCOUNTER — Telehealth: Payer: Self-pay | Admitting: Family Medicine

## 2021-08-29 ENCOUNTER — Ambulatory Visit (INDEPENDENT_AMBULATORY_CARE_PROVIDER_SITE_OTHER): Payer: Medicare Other | Admitting: Family Medicine

## 2021-08-29 ENCOUNTER — Other Ambulatory Visit: Payer: Self-pay

## 2021-08-29 ENCOUNTER — Encounter: Payer: Self-pay | Admitting: Family Medicine

## 2021-08-29 VITALS — BP 113/75 | HR 100 | Temp 98.4°F | Resp 20 | Ht 70.0 in

## 2021-08-29 DIAGNOSIS — Z7409 Other reduced mobility: Secondary | ICD-10-CM | POA: Diagnosis not present

## 2021-08-29 DIAGNOSIS — M25562 Pain in left knee: Secondary | ICD-10-CM | POA: Diagnosis not present

## 2021-08-29 DIAGNOSIS — M25462 Effusion, left knee: Secondary | ICD-10-CM

## 2021-08-29 MED ORDER — TIZANIDINE HCL 4 MG PO TABS: 2.0000 mg | ORAL_TABLET | Freq: Three times a day (TID) | ORAL | 2 refills | Status: DC | PRN

## 2021-08-29 NOTE — Telephone Encounter (Signed)
Xrays up front to p/u pt aware

## 2021-08-29 NOTE — Progress Notes (Signed)
Subjective: CC: Left knee injury PCP: [name], DO XTG:GYIRSWN D Konicek is a 25 y.o. male presenting to clinic today for:  1.  Left knee pain Patient was seen on 08/17/2021.  He is here for medication renewal of the Zanaflex that was provided at an urgent care.  He felt that this was somewhat helpful though did not resolve his symptoms.  He has an appointment in about a week with his orthopedist for reevaluation of this left knee.  He has been off of his feet to try and reduce exacerbation of the pain   ROS: Per HPI  Allergies  Allergen Reactions   Tramadol Anaphylaxis   Morphine Nausea And Vomiting    According to mother the patient is intolerant of morphine; causes nausea and vomiting.  Has had none since he was 25yo.   Past Medical History:  Diagnosis Date   Adrenal insufficiency ([place])    Cancer ([place])    brain tumor on brain stem   Hydrocephalus ([place])    Osteoporosis    Thyroid disease     Current Outpatient Medications:    atorvastatin (LIPITOR) 40 MG tablet, , Disp: , Rfl:    busPIRone (BUSPAR) 5 MG tablet, Take 1 tablet (5 mg total) by mouth 3 (three) times daily. For anxiety (NEEDS TO BE SEEN BEFORE NEXT REFILL), Disp: 90 tablet, Rfl: 0   cetirizine (ZYRTEC) 10 MG tablet, Take 1 tablet (10 mg total) by mouth daily., Disp: 30 tablet, Rfl: 11   Cholecalciferol 50 MCG (2000 UT) CAPS, Take by mouth., Disp: , Rfl:    clotrimazole-betamethasone (LOTRISONE) cream, Apply 1 application topically 2 (two) times daily. x7-10, Disp: 30 g, Rfl: 0   dapsone 100 MG tablet, Take 1 tablet (100 mg total) by mouth daily., Disp: 10 tablet, Rfl: 0   escitalopram (LEXAPRO) 10 MG tablet, Take 1 tablet (10 mg total) by mouth daily., Disp: 90 tablet, Rfl: 3   fluconazole (DIFLUCAN) 100 MG tablet, Take two with first dose. Then starting the next day take one daily until all are taken., Disp: 15 tablet, Rfl: 0   fluticasone (FLONASE) 50 MCG/ACT nasal spray, Place 2 sprays into both  nostrils daily., Disp: 16 g, Rfl: 6   fluticasone (FLOVENT HFA) 44 MCG/ACT inhaler, Inhale 2 puffs into the lungs 2 (two) times daily., Disp: 1 each, Rfl: 12   ibuprofen (ADVIL) 600 MG tablet, Take 1 tablet (600 mg total) by mouth every 8 (eight) hours as needed for moderate pain., Disp: 90 tablet, Rfl: 1   levETIRAcetam (KEPPRA) 750 MG tablet, Take 1,500 mg by mouth in the morning and at bedtime., Disp: , Rfl:    levothyroxine (SYNTHROID) 112 MCG tablet, Take 1 tablet by mouth daily., Disp: , Rfl:    omeprazole (PRILOSEC) 20 MG capsule, Take 1 capsule (20 mg total) by mouth daily. At lunch time., Disp: 30 capsule, Rfl: 6   Spacer/Aero-Holding Chambers (AEROCHAMBER PLUS) inhaler, Use as instructed, Disp: 1 each, Rfl: 2   Testosterone 20.25 MG/ACT (1.62%) GEL, ONE PUM ON EACH SHOULDER EVERY MORNIG, Disp: 75 g, Rfl: 1   thiamine 100 MG tablet, Take by mouth., Disp: , Rfl:    VENTOLIN HFA 108 (90 Base) MCG/ACT inhaler, TAKE 2 PUFFS BY MOUTH EVERY 6 HOURS AS NEEDED FOR WHEEZE OR SHORTNESS OF BREATH, Disp: 18 each, Rfl: 1 Social History   Socioeconomic History   Marital status: Single    Spouse name: Not on file   Number of children: 0  Years of education: 7th grade   Highest education level: 7th grade  Occupational History   Occupation: disabled  Tobacco Use   Smoking status: Every Day    Packs/day: 0.25    Years: 6.00    Pack years: 1.50    Types: Cigarettes    Start date: 11/25/2012   Smokeless tobacco: Never  Vaping Use   Vaping Use: Never used  Substance and Sexual Activity   Alcohol use: No   Drug use: No   Sexual activity: Not Currently  Other Topics Concern   Not on file  Social History Narrative   Not on file   Social Determinants of Health   Financial Resource Strain: Not on file  Food Insecurity: Not on file  Transportation Needs: Not on file  Physical Activity: Insufficiently Active   Days of Exercise per Week: 2 days   Minutes of Exercise per Session: 10 min   Stress: Not on file  Social Connections: Not on file  Intimate Partner Violence: Not on file   History reviewed. No pertinent family history.  Objective: Office vital signs reviewed. BP 113/75   Pulse 100   Temp 98.4 F (36.9 C)   Resp 20   Ht 5\' 10"  (1.778 m)   SpO2 96%   BMI 19.66 kg/m   Physical Examination:  MSK: Arrives in wheelchair.  Has bilateral leg braces on.  Left knee with no gross deformity, soft tissue swelling or discoloration.  However, he has extreme tenderness to even light touch particularly around the medial left knee  Assessment/ Plan: 25 y.o. male   Pain and swelling of left knee - Plan: tiZANidine (ZANAFLEX) 4 MG tablet  Impaired mobility and endurance - Plan: tiZANidine (ZANAFLEX) 4 MG tablet  Keep follow-up with orthopedics.  I still question meniscal injury given reports of injury and pain and pain out of portion to exam with unremarkable x-ray.  Zanaflex has been renewed.  Caution sedation, dryness.  Encourage p.o. hydration  No orders of the defined types were placed in this encounter.  No orders of the defined types were placed in this encounter.    [name], DO [place] [phone]

## 2021-09-01 ENCOUNTER — Other Ambulatory Visit: Payer: Self-pay | Admitting: Family Medicine

## 2021-09-01 DIAGNOSIS — F419 Anxiety disorder, unspecified: Secondary | ICD-10-CM

## 2021-09-05 DIAGNOSIS — M25562 Pain in left knee: Secondary | ICD-10-CM | POA: Diagnosis not present

## 2021-09-08 ENCOUNTER — Other Ambulatory Visit: Payer: Self-pay | Admitting: Family Medicine

## 2021-09-22 ENCOUNTER — Other Ambulatory Visit: Payer: Self-pay | Admitting: Family Medicine

## 2021-09-22 DIAGNOSIS — L2489 Irritant contact dermatitis due to other agents: Secondary | ICD-10-CM

## 2021-09-26 ENCOUNTER — Telehealth: Payer: Medicare Other

## 2021-10-18 ENCOUNTER — Other Ambulatory Visit: Payer: Self-pay | Admitting: Family Medicine

## 2021-10-18 ENCOUNTER — Telehealth: Payer: Medicare Other | Admitting: *Deleted

## 2021-10-18 DIAGNOSIS — M25562 Pain in left knee: Secondary | ICD-10-CM

## 2021-10-28 IMAGING — DX DG KNEE 1-2V*L*
2 series · 2 of 2 positions shown · non-contrast
Comparison: 10/16/2019

CLINICAL DATA: Left knee pain and swelling

EXAM:
LEFT KNEE - 1-2 VIEW

[knee ap]
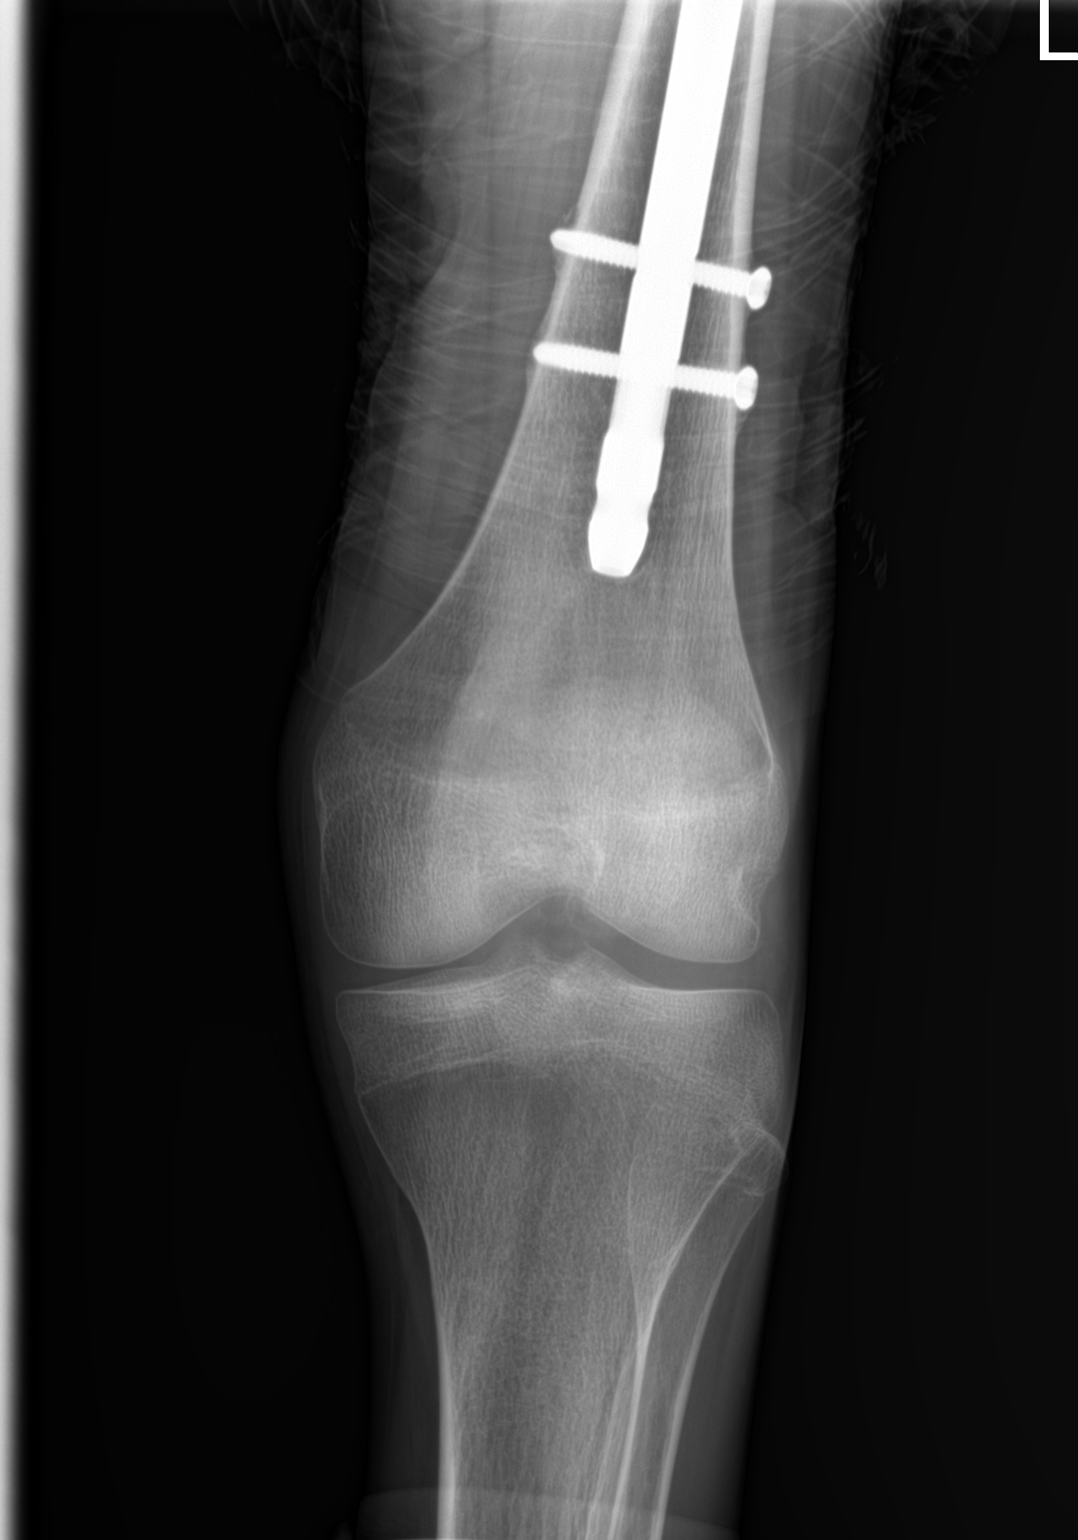

[knee lat]
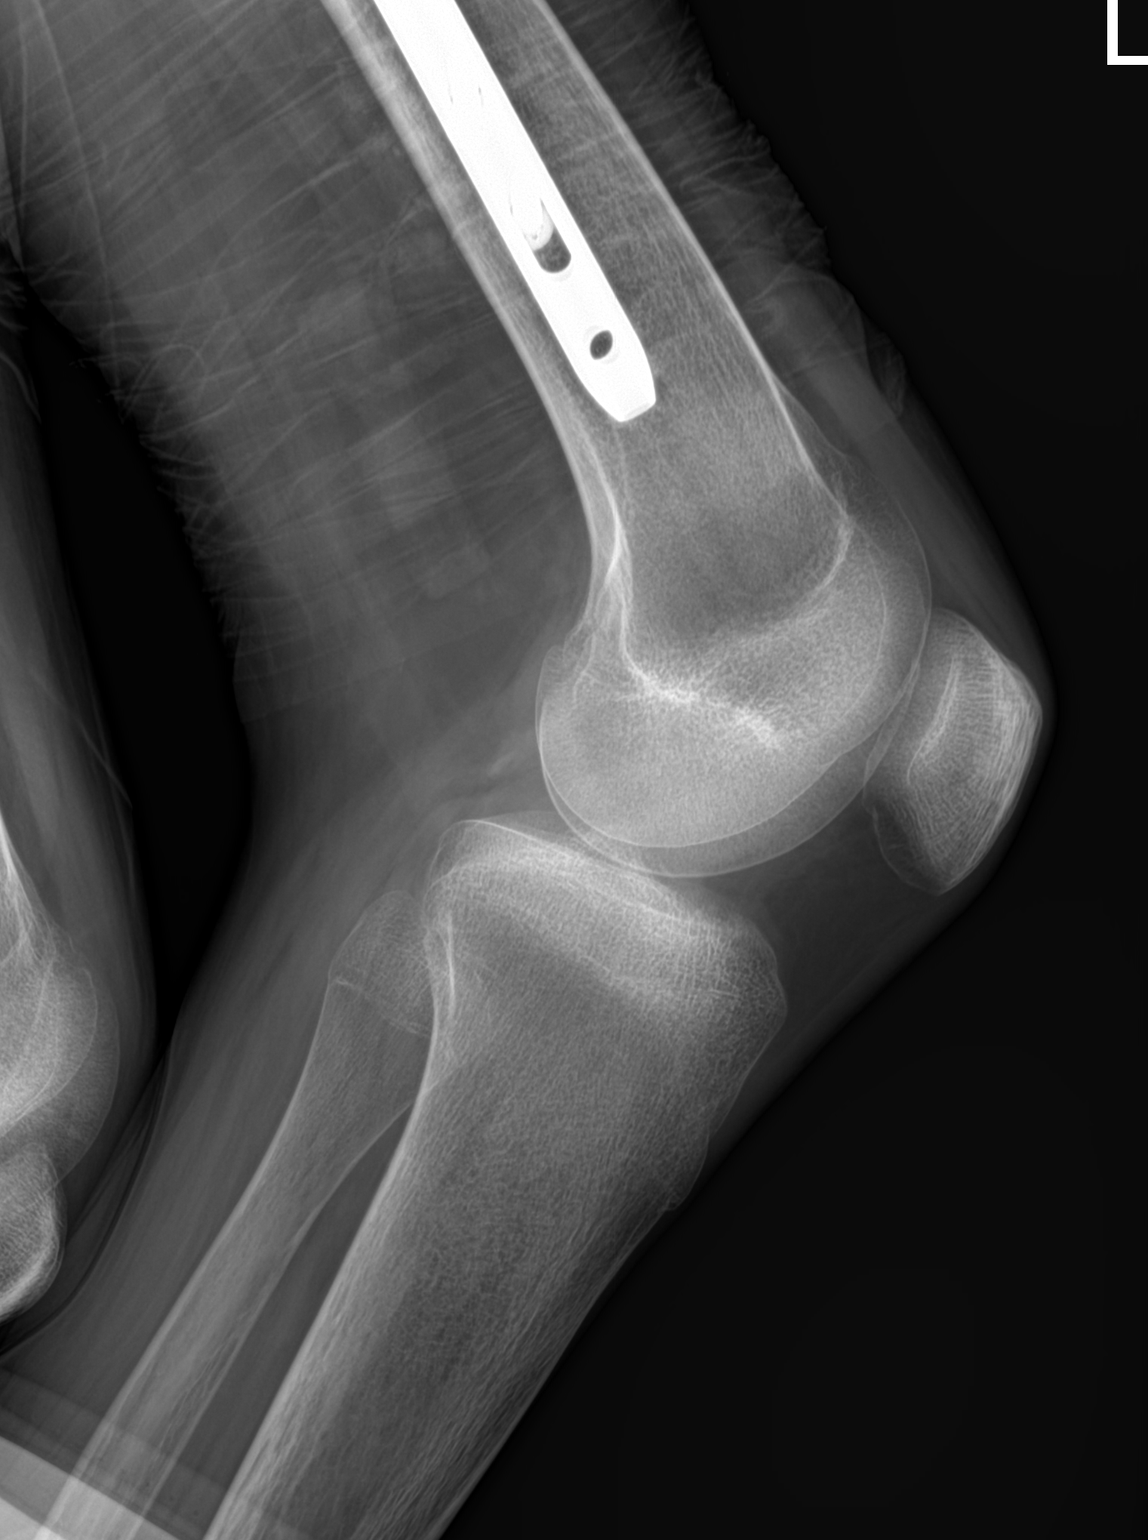

[2 of 2 positions shown; findings below may reference images not displayed]

FINDINGS: No evidence of fracture, dislocation, or joint effusion. Femoral
intramedullary rod with distal interlocking screws without evidence
of complication. No evidence of arthropathy or other focal bone
abnormality. Soft tissues are unremarkable.
IMPRESSION: Negative.

## 2021-10-31 ENCOUNTER — Ambulatory Visit (INDEPENDENT_AMBULATORY_CARE_PROVIDER_SITE_OTHER): Payer: Medicare Other | Admitting: Licensed Clinical Social Worker

## 2021-10-31 DIAGNOSIS — C801 Malignant (primary) neoplasm, unspecified: Secondary | ICD-10-CM

## 2021-10-31 DIAGNOSIS — M818 Other osteoporosis without current pathological fracture: Secondary | ICD-10-CM

## 2021-10-31 DIAGNOSIS — C716 Malignant neoplasm of cerebellum: Secondary | ICD-10-CM

## 2021-10-31 DIAGNOSIS — F322 Major depressive disorder, single episode, severe without psychotic features: Secondary | ICD-10-CM

## 2021-10-31 DIAGNOSIS — E890 Postprocedural hypothyroidism: Secondary | ICD-10-CM

## 2021-10-31 DIAGNOSIS — I9589 Other hypotension: Secondary | ICD-10-CM

## 2021-10-31 NOTE — Patient Instructions (Addendum)
Visit Information  Patient Goals:  Manage Emotions. Manage Depression issues faced  Timeframe:  Short-Term Goal Priority:  Medium Progress: On Track Start Date:           10/31/21                Expected End Date:         01/26/22            Follow Up Date:  12/27/21 at 3:00 PM   Manage Emotions. Manage Depression issues faced    Why is this important?   When you are stressed, down or upset, your body reacts too.  For example, your blood pressure may get higher; you may have a headache or stomachache.  When your emotions get the best of you, your body's ability to fight off cold and flu gets weak.  These steps will help you manage your emotions.     Patient Self Care Activities:  Attends scheduled medical appointments Has strong family support  Patient Coping Strengths:  Supportive Relationships Family  Patient Self Care Deficits:  Mobility issues Transport needs  Patient Goals:  - spend time or talk with others at least 2 to 3 times per week - practice relaxation or meditation daily - keep a calendar with appointment dates  Follow Up Plan: LCSW to call client or [name], grandmother of client, on 12/27/21 at 3:00 PM to assess needs of client   Clee Pandit.Suhailah Kwan MSW, LCSW Licensed Clinical Social Worker [facility] Care Management [phone]

## 2021-10-31 NOTE — Chronic Care Management (AMB) (Signed)
Chronic Care Management    Clinical Social Work Note  10/31/2021 Name: James Hoffman MRN: [id number] DOB: 1996/03/10  James Hoffman is a 25 y.o. year old male who is a primary care patient of James Norlander, DO. The CCM team was consulted to assist the patient with chronic disease management and/or care coordination needs related to: [name]   Engaged with patient / grandmother of patient, James Hoffman, by telephone for follow up visit in response to provider referral for social work chronic care management and care coordination services.   Consent to Services:  The patient was given information about Chronic Care Management services, agreed to services, and gave verbal consent prior to initiation of services.  Please see initial visit note for detailed documentation.   Patient agreed to services and consent obtained.   Assessment: Review of patient past medical history, allergies, medications, and health status, including review of relevant consultants reports was performed today as part of a comprehensive evaluation and provision of chronic care management and care coordination services.     SDOH (Social Determinants of Health) assessments and interventions performed:  SDOH Interventions    Flowsheet Row Most Recent Value  SDOH Interventions   Physical Activity Interventions Other (Comments)  [client has ambulation issues. uses wheelchair to ambulate]  Depression Interventions/Treatment  Currently on Treatment        Advanced Directives Status: See Vynca application for related entries.  CCM Care Plan  Allergies  Allergen Reactions   Tramadol Anaphylaxis   Morphine Nausea And Vomiting    According to mother the patient is intolerant of morphine; causes nausea and vomiting.  Has had none since he was 25yo.    Outpatient Encounter Medications as of 10/31/2021  Medication Sig   atorvastatin (LIPITOR) 40 MG tablet TAKE 1 TABLET BY MOUTH EVERYDAY AT  BEDTIME   busPIRone (BUSPAR) 5 MG tablet Take 1 tablet (5 mg total) by mouth 3 (three) times daily. For Anxiety   cetirizine (ZYRTEC) 10 MG tablet Take 1 tablet (10 mg total) by mouth daily.   Cholecalciferol 50 MCG (2000 UT) CAPS Take by mouth.   clotrimazole-betamethasone (LOTRISONE) cream APPLY 1 APPLICATION TOPICALLY 2 (TWO) TIMES DAILY. X 7-10 DAYS   dapsone 100 MG tablet Take 1 tablet (100 mg total) by mouth daily.   escitalopram (LEXAPRO) 10 MG tablet Take 1 tablet (10 mg total) by mouth daily.   fluconazole (DIFLUCAN) 100 MG tablet Take two with first dose. Then starting the next day take one daily until all are taken.   fluticasone (FLONASE) 50 MCG/ACT nasal spray Place 2 sprays into both nostrils daily.   fluticasone (FLOVENT HFA) 44 MCG/ACT inhaler Inhale 2 puffs into the lungs 2 (two) times daily.   ibuprofen (ADVIL) 600 MG tablet TAKE 1 TABLET (600 MG TOTAL) BY MOUTH EVERY 8 (EIGHT) HOURS AS NEEDED FOR MODERATE PAIN.   levETIRAcetam (KEPPRA) 750 MG tablet Take 1,500 mg by mouth in the morning and at bedtime.   omeprazole (PRILOSEC) 20 MG capsule Take 1 capsule (20 mg total) by mouth daily. At lunch time.   Spacer/Aero-Holding Chambers (AEROCHAMBER PLUS) inhaler Use as instructed   Testosterone 20.25 MG/ACT (1.62%) GEL ONE PUM ON EACH SHOULDER EVERY MORNIG   thiamine 100 MG tablet Take by mouth.   tiZANidine (ZANAFLEX) 4 MG tablet Take 0.5-1 tablets (2-4 mg total) by mouth every 8 (eight) hours as needed for muscle spasms.   VENTOLIN HFA 108 (90 Base) MCG/ACT inhaler TAKE 2 PUFFS  BY MOUTH EVERY 6 HOURS AS NEEDED FOR WHEEZE OR SHORTNESS OF BREATH   No facility-administered encounter medications on file as of 10/31/2021.    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   Blindness and low vision 10/29/2019   History of eye surgery 10/29/2019   History of brain tumor 10/29/2019   Gait instability 12/19/2016   Hypogonadism, male 07/19/2016   Normocytic anemia 04/14/2016   Muscular deconditioning 09/17/2015   Hypothyroidism 09/09/2015   Medullary carcinoma ([place]) 09/09/2015   Adrenal insufficiency ([place]) 09/09/2015   Hypotension 09/09/2015   Hyponatremia 09/09/2015   Hypokalemia 09/09/2015    Conditions to be addressed/monitored: monitor client management of depression issues  Care Plan : LCSW care plan  Updates made by James Cabal, LCSW since 10/31/2021 12:00 AM     Problem: Depression Identification (Depression)      Goal: Depressive Symptoms Identified: Manage depression sympotms faced   Start Date: 10/31/2021  Expected End Date: 01/26/2022  This Visit's Progress: On track  Recent Progress: On track  Priority: Medium  Note:   Current Barriers:  Chronic Mental Health needs related to depression and depression management Mobility issues Transportation needs Suicidal Ideation/Homicidal Ideation: No  Clinical Social Work Goal(s):  patient Medical illustrator will work with SW monthly by telephone or in person to reduce or manage symptoms related to depression and depression management for client Patient will attend all scheduled medical appointments in next 30 days Patient or family representative will call RNCM or LCSW as needed in next 30 days for CCM support for client  Interventions: 1:1 collaboration with James Norlander, DO regarding development and update of comprehensive plan of care as evidenced by provider attestation and co-signature Discussed client needs with James Hoffman, grandmother of client. Reviewed appetite of client, reviewed pain issues of client Discussed upcoming client medical appointments Reviewed hearing issues of client James Hoffman said client has hearing  deficits) Reviewed client ambulation issues. She said client uses a wheelchair to ambulate when outside of the home.  Discussed with James Hoffman the memory challenges of client James Hoffman said she has to remind James Hoffman to eat to keep his weight up. She said often client will ask her the same question multiple times) Reviewed client use of Boost supplement Discussed sleeping issues of client.  Discussed smoking issues of client. James Hoffman said client smokes over 1/2 pack of cigarettes daily. Reviewed vision issues of client. James Hoffman said that client has reduced vision.  Reviewed incontinency issues of client James Hoffman said incontinency issues of client have improved greatly) Encouraged James Hoffman or client to call RNCM as needed for nursing support Discussed mood status of client. James Hoffman said client is irritable occasionally. Client gets sad occasionally. Client likes to watch TV, likes to listen to music.  Client likes being around family members.  Patient Self Care Activities:  Attends scheduled medical appointments Has strong family support  Patient Coping Strengths:  Supportive Relationships Family  Patient Self Care Deficits:  Mobility issues Transport needs  Patient Goals:  - spend time or talk with others at least 2 to 3 times per week - practice relaxation or meditation daily - keep a calendar with appointment dates  Follow Up Plan: LCSW to call client or James Hoffman, grandmother, on 12/27/21 at 3:00 PM  to assess client needs     Aldrich Lloyd.Jalyne Brodzinski MSW, LCSW Licensed Clinical Social Worker [facility] Care Management [phone]

## 2021-11-09 ENCOUNTER — Other Ambulatory Visit: Payer: Self-pay | Admitting: Family Medicine

## 2021-11-09 DIAGNOSIS — R062 Wheezing: Secondary | ICD-10-CM

## 2021-11-09 DIAGNOSIS — J9801 Acute bronchospasm: Secondary | ICD-10-CM

## 2021-11-17 ENCOUNTER — Other Ambulatory Visit: Payer: Self-pay | Admitting: Family Medicine

## 2021-11-17 DIAGNOSIS — Z7409 Other reduced mobility: Secondary | ICD-10-CM

## 2021-11-17 DIAGNOSIS — M25462 Effusion, left knee: Secondary | ICD-10-CM

## 2021-11-18 ENCOUNTER — Telehealth: Payer: Self-pay | Admitting: Family Medicine

## 2021-11-18 DIAGNOSIS — E890 Postprocedural hypothyroidism: Secondary | ICD-10-CM

## 2021-11-18 NOTE — Telephone Encounter (Signed)
Pt needs thyroid checked per endocrinologist. They have attempted to fax [place] orders but we have not received. Is it possible for Dr. Darnell Level to order these since we have not received the orders. Please call back to let the pt know.

## 2021-11-21 NOTE — Telephone Encounter (Signed)
Ok to place TSH and FT4

## 2021-11-21 NOTE — Telephone Encounter (Signed)
Mailbox full

## 2021-11-22 NOTE — Addendum Note (Signed)
Addended by: Milas Hock on: 11/22/2021 01:51 PM   Modules accepted: Orders

## 2021-11-22 NOTE — Telephone Encounter (Signed)
Orders placed and pt is aware.

## 2021-11-24 ENCOUNTER — Telehealth: Payer: Medicare Other

## 2021-11-25 ENCOUNTER — Other Ambulatory Visit: Payer: Self-pay | Admitting: Family Medicine

## 2021-11-25 ENCOUNTER — Other Ambulatory Visit: Payer: Medicare Other

## 2021-11-25 DIAGNOSIS — E890 Postprocedural hypothyroidism: Secondary | ICD-10-CM | POA: Diagnosis not present

## 2021-11-25 DIAGNOSIS — R10826 Epigastric rebound abdominal tenderness: Secondary | ICD-10-CM

## 2021-11-25 DIAGNOSIS — K219 Gastro-esophageal reflux disease without esophagitis: Secondary | ICD-10-CM

## 2021-11-26 LAB — CMP14+EGFR
ALT: 25 IU/L (ref 0–44)
AST: 28 IU/L (ref 0–40)
Albumin/Globulin Ratio: 2.2 (ref 1.2–2.2)
Albumin: 3.9 g/dL — ABNORMAL LOW (ref 4.1–5.2)
Alkaline Phosphatase: 166 IU/L — ABNORMAL HIGH (ref 44–121)
BUN/Creatinine Ratio: 6 — ABNORMAL LOW (ref 9–20)
BUN: 4 mg/dL — ABNORMAL LOW (ref 6–20)
Bilirubin Total: <0.2 mg/dL (ref 0.0–1.2)
CO2: 28 mmol/L (ref 20–29)
Calcium: 9.6 mg/dL (ref 8.7–10.2)
Chloride: 90 mmol/L — ABNORMAL LOW (ref 96–106)
Creatinine, Ser: 0.69 mg/dL — ABNORMAL LOW (ref 0.76–1.27)
Globulin, Total: 1.8 g/dL (ref 1.5–4.5)
Glucose: 95 mg/dL (ref 70–99)
Potassium: 4.4 mmol/L (ref 3.5–5.2)
Sodium: 132 mmol/L — ABNORMAL LOW (ref 134–144)
Total Protein: 5.7 g/dL — ABNORMAL LOW (ref 6.0–8.5)
eGFR: 132 mL/min/{1.73_m2} (ref >59)

## 2021-11-26 LAB — TSH: TSH: 0.011 u[IU]/mL — ABNORMAL LOW (ref 0.450–4.500)

## 2021-11-26 LAB — T4, FREE: Free T4: 1.11 ng/dL (ref 0.82–1.77)

## 2021-11-29 ENCOUNTER — Ambulatory Visit (INDEPENDENT_AMBULATORY_CARE_PROVIDER_SITE_OTHER): Payer: Medicare Other | Admitting: Family

## 2021-11-29 ENCOUNTER — Ambulatory Visit (INDEPENDENT_AMBULATORY_CARE_PROVIDER_SITE_OTHER): Payer: Medicare Other

## 2021-11-29 ENCOUNTER — Encounter: Payer: Self-pay | Admitting: Family

## 2021-11-29 VITALS — Ht 70.0 in | Wt 137.0 lb

## 2021-11-29 DIAGNOSIS — B9689 Other specified bacterial agents as the cause of diseases classified elsewhere: Secondary | ICD-10-CM | POA: Diagnosis not present

## 2021-11-29 DIAGNOSIS — J208 Acute bronchitis due to other specified organisms: Secondary | ICD-10-CM

## 2021-11-29 DIAGNOSIS — Z0000 Encounter for general adult medical examination without abnormal findings: Secondary | ICD-10-CM | POA: Diagnosis not present

## 2021-11-29 MED ORDER — FLUTICASONE PROPIONATE 50 MCG/ACT NA SUSP: 2.0000 | Freq: Every day | NASAL | 6 refills | Status: DC

## 2021-11-29 MED ORDER — DOXYCYCLINE HYCLATE 100 MG PO TABS: 100.0000 mg | ORAL_TABLET | Freq: Two times a day (BID) | ORAL | 0 refills | Status: DC

## 2021-11-29 NOTE — Progress Notes (Signed)
Subjective:   James Hoffman is a 26 y.o. male who presents for Medicare Annual/Subsequent preventive examination.  Virtual Visit via Telephone Note  I connected with  James Hoffman on 11/29/21 at  4:15 PM EST by telephone and verified that I am speaking with the correct person using two identifiers.  Location: Patient: Home Provider: [facility] Persons participating in the virtual visit: patient/aunt, Oakley Powell/ Nurse Health Advisor   I discussed the limitations, risks, security and privacy concerns of performing an evaluation and management service by telephone and the availability of in person appointments. The patient expressed understanding and agreed to proceed.  Interactive audio and video telecommunications were attempted between this nurse and patient, however failed, due to patient having technical difficulties OR patient did not have access to video capability.  We continued and completed visit with audio only.  Some vital signs may be absent or patient reported.   James Hoffman E James Brazil, LPN   Review of Systems     Cardiac Risk Factors include: smoking/ tobacco exposure;sedentary lifestyle;male gender;Other (see comment), Risk factor comments: medulloblastoma, hx of chemo and radiation     Objective:    Today's Vitals   11/29/21 1618 11/29/21 1622  Weight: 137 lb (62.1 kg)   Height: '5\' 10"'  (1.778 m)   PainSc:  4    Body mass index is 19.66 kg/m.  Advanced Directives 06/14/2021 02/24/2020 12/06/2017 10/28/2017 06/25/2017 12/26/2016 04/04/2016  Does Patient Have a Medical Advance Directive? No No No No No No No  Would patient like information on creating a medical advance directive? No - Guardian declined No - Patient declined - No - Patient declined - - -    Current Medications (verified) Outpatient Encounter Medications as of 11/29/2021  Medication Sig   atorvastatin (LIPITOR) 40 MG tablet TAKE 1 TABLET BY MOUTH EVERYDAY AT BEDTIME   busPIRone (BUSPAR) 5 MG tablet  Take 1 tablet (5 mg total) by mouth 3 (three) times daily. For Anxiety   cetirizine (ZYRTEC) 10 MG tablet Take 1 tablet (10 mg total) by mouth daily.   Cholecalciferol 50 MCG (2000 UT) CAPS Take by mouth.   clotrimazole-betamethasone (LOTRISONE) cream APPLY 1 APPLICATION TOPICALLY 2 (TWO) TIMES DAILY. X 7-10 DAYS   dapsone 100 MG tablet Take 1 tablet (100 mg total) by mouth daily.   doxycycline (VIBRA-TABS) 100 MG tablet Take 1 tablet (100 mg total) by mouth 2 (two) times daily.   escitalopram (LEXAPRO) 10 MG tablet Take 1 tablet (10 mg total) by mouth daily.   fluconazole (DIFLUCAN) 100 MG tablet Take two with first dose. Then starting the next day take one daily until all are taken.   fluticasone (FLONASE) 50 MCG/ACT nasal spray Place 2 sprays into both nostrils daily.   fluticasone (FLOVENT HFA) 44 MCG/ACT inhaler Inhale 2 puffs into the lungs 2 (two) times daily.   ibuprofen (ADVIL) 600 MG tablet TAKE 1 TABLET (600 MG TOTAL) BY MOUTH EVERY 8 (EIGHT) HOURS AS NEEDED FOR MODERATE PAIN.   levETIRAcetam (KEPPRA) 750 MG tablet Take 1,500 mg by mouth in the morning and at bedtime.   levothyroxine (SYNTHROID) 88 MCG tablet Take 88 mcg by mouth daily before breakfast.   omeprazole (PRILOSEC) 20 MG capsule TAKE 1 CAPSULE (20 MG TOTAL) BY MOUTH DAILY. AT LUNCH TIME.   Spacer/Aero-Holding Chambers (AEROCHAMBER PLUS) inhaler Use as instructed   Testosterone 20.25 MG/ACT (1.62%) GEL ONE PUM ON EACH SHOULDER EVERY MORNIG   thiamine 100 MG tablet Take by mouth.  tiZANidine (ZANAFLEX) 4 MG tablet TAKE 1/2 TO 1 TABLET (2-4 MG TOTAL) BY MOUTH EVERY 8 (EIGHT) HOURS AS NEEDED FOR MUSCLE SPASMS.   VENTOLIN HFA 108 (90 Base) MCG/ACT inhaler TAKE 2 PUFFS BY MOUTH EVERY 6 HOURS AS NEEDED FOR WHEEZE OR SHORTNESS OF BREATH   No facility-administered encounter medications on file as of 11/29/2021.    Allergies (verified) Tramadol and Morphine   History: Past Medical History:  Diagnosis Date   Adrenal  insufficiency ([place])    Cancer ([place])    brain tumor on brain stem   Hydrocephalus ([place])    Osteoporosis    Thyroid disease    Past Surgical History:  Procedure Laterality Date   brain tumor removal  August 2012   EYE SURGERY Right 2014   cataract removed   HIP SURGERY     teeth removal     History reviewed. No pertinent family history. Social History   Socioeconomic History   Marital status: Single    Spouse name: Not on file   Number of children: 0   Years of education: 7th grade   Highest education level: 7th grade  Occupational History   Occupation: disabled  Tobacco Use   Smoking status: Every Day    Packs/day: 0.25    Years: 6.00    Pack years: 1.50    Types: Cigarettes    Start date: 11/25/2012   Smokeless tobacco: Never  Vaping Use   Vaping Use: Never used  Substance and Sexual Activity   Alcohol use: No   Drug use: No   Sexual activity: Not Currently  Other Topics Concern   Not on file  Social History Narrative   Lives with his grandmother   Mostly wheelchair bound   Sometimes he uses walker when knees aren't hurt   Mostly dependent on others for assistance - eats on his own, but can't prepare meals, has to have help in and out of shower, but bathes himself, etc.   Social Determinants of Health   Financial Resource Strain: Low Risk    Difficulty of Paying Living Expenses: Not hard at all  Food Insecurity: No Food Insecurity   Worried About Charity fundraiser in the Last Year: Never true   [place] in the Last Year: Never true  Transportation Needs: No Transportation Needs   Lack of Transportation (Medical): No   Lack of Transportation (Non-Medical): No  Physical Activity: Insufficiently Active   Days of Exercise per Week: 7 days   Minutes of Exercise per Session: 20 min  Stress: Stress Concern Present   Feeling of Stress : To some extent  Social Connections: Socially Isolated   Frequency of Communication with Friends and Family: More than  three times a week   Frequency of Social Gatherings with Friends and Family: More than three times a week   Attends Religious Services: Never   Marine scientist or Organizations: No   Attends Music therapist: Never   Marital Status: Never married    Tobacco Counseling Ready to quit: Not Answered Counseling given: Not Answered   Clinical Intake:  Pre-visit preparation completed: Yes  Pain : 0-10 Pain Score: 4  Pain Type: Chronic pain Pain Location: Knee Pain Orientation: Left Pain Descriptors / Indicators: Aching, Discomfort, Sore Pain Onset: More than a month ago Pain Frequency: Intermittent     BMI - recorded: 19.66 Nutritional Status: BMI of 19-24  Normal Nutritional Risks: None Diabetes: No  How often do  you need to have someone help you when you read instructions, pamphlets, or other written materials from your doctor or pharmacy?: 1 - Never  Diabetic? no  Interpreter Needed?: No  Information entered by :: Oluwadamilola Rosamond, LPN   Activities of Daily Living In your present state of health, do you have any difficulty performing the following activities: 11/29/2021  Hearing? Y  Vision? Y  Difficulty concentrating or making decisions? Y  Walking or climbing stairs? Y  Dressing or bathing? Y  Doing errands, shopping? Y  Preparing Food and eating ? Y  Using the Toilet? N  In the past six months, have you accidently leaked urine? N  Do you have problems with loss of bowel control? N  Managing your Medications? Y  Managing your Finances? Y  Housekeeping or managing your Housekeeping? Y  Some recent data might be hidden    Patient Care Team: [name], DO as PCP - General (Family Medicine) Ilean China, RN as Case Manager [place], [name], LCSW as Social Worker (Licensed Clinical Social Worker)  Indicate any recent [employer] you may have received from other than [facility] providers in the past year (date may be  approximate).     Assessment:   This is a routine wellness examination for [place].  Hearing/Vision screen Hearing Screening - Comments:: Only has 15% hearing in right ear, chemo and radiation damaged auditory nerves - he can't get hearing aids because he won't stop messing with them Vision Screening - Comments:: Cataract in left eye - won't do surgery right now - eye doctor thinks he may damage it - [facility]  Dietary issues and exercise activities discussed: Current Exercise Habits: Home exercise routine, Type of exercise: walking;stretching, Time (Minutes): 20, Frequency (Times/Week): 7, Weekly Exercise (Minutes/Week): 140, Intensity: Mild, Exercise limited by: orthopedic condition(s);neurologic condition(s);psychological condition(s)   Goals Addressed             This Visit's Progress    Exercise 3x per week (30 min per time)         Depression Screen PHQ 2/9 Scores 11/29/2021 10/31/2021 08/29/2021 06/30/2021 06/10/2021 06/01/2021 05/24/2021  PHQ - 2 Score '2 4 4 4 4 3 2  ' PHQ- 9 Score '11 13 16 16 18 18 6  ' Exception Documentation - - - - - - -    Fall Risk Fall Risk  11/29/2021 08/29/2021 05/24/2021 03/29/2021 01/20/2021  Falls in the past year? 0 0 '1 1 1  ' Number falls in past yr: 0 - 0 0 1  Injury with Fall? 0 - 0 0 0  Comment - - - - -  Risk Factor Category  - - - - -  Risk for fall due to : Impaired mobility;Orthopedic patient - - History of fall(s) History of fall(s);Impaired balance/gait;Impaired mobility  Follow up Education provided;Falls prevention discussed - - - Falls evaluation completed    FALL RISK PREVENTION PERTAINING TO THE HOME:  Any stairs in or around the home? No  If so, are there any without handrails? No  Home free of loose throw rugs in walkways, pet beds, electrical cords, etc? Yes  Adequate lighting in your home to reduce risk of falls? Yes   ASSISTIVE DEVICES UTILIZED TO PREVENT FALLS:  Life alert? No  Use of a cane,  walker or w/c? Yes  Grab bars in the bathroom? Yes  Shower chair or bench in shower? Yes  Elevated toilet seat or a handicapped toilet? No   TIMED  UP AND GO:  Was the test performed? No . Telephonic visit  Cognitive Function: MMSE - Mini Mental State Exam 11/29/2021 02/24/2020  Not completed: Unable to complete Unable to complete        Immunizations Immunization History  Administered Date(s) Administered   DTP 05/15/1996, 07/17/1996, 10/21/1996   DTaP 05/15/1996, 07/17/1996, 10/21/1996, 09/13/1998, 05/09/2001   Hepatitis B 05/15/1996, 07/17/1996, 10/21/1996   Hepatitis B, ped/adol 05/15/1996, 07/17/1996, 10/21/1996   HiB (PRP-OMP) 05/21/1996, 07/17/1996, 10/21/1996, 09/13/1998   HiB (PRP-T) 05/21/1996, 07/17/1996, 10/21/1996, 09/13/1998   IPV 05/15/1996, 07/17/1996, 10/21/1996, 05/09/2001   Influenza,inj,Quad PF,6+ Mos 10/29/2019, 08/17/2021   Influenza-Unspecified 08/15/2010, 10/29/2019   MMR 09/13/1998, 05/09/2001   Moderna Sars-Covid-2 Vaccination 02/17/2020, 03/16/2020, 11/23/2020   OPV 05/15/1996, 07/17/1996, 10/21/1996   Pneumococcal Polysaccharide-23 05/18/2021   Tdap 07/06/2008    TDAP status: Due, Education has been provided regarding the importance of this vaccine. Advised may receive this vaccine at local pharmacy or Health Dept. Aware to provide a copy of the vaccination record if obtained from local pharmacy or Health Dept. Verbalized acceptance and understanding.  Flu Vaccine status: Up to date  Pneumococcal vaccine status: Up to date  Covid-19 vaccine status: Completed vaccines  Qualifies for Shingles Vaccine? No   Zostavax completed No    Screening Tests Health Maintenance  Topic Date Due   Hepatitis C Screening  Never done   TETANUS/TDAP  07/06/2018   COVID-19 Vaccine (4 - Booster for Moderna series) 01/18/2021   HPV VACCINES (1 - Male 2-dose series) 08/17/2022 (Originally 02/22/2007)   Pneumococcal Vaccine 20-26 Years old (2 - PCV) 05/18/2022    INFLUENZA VACCINE  Completed   HIV Screening  Completed    Health Maintenance  Health Maintenance Due  Topic Date Due   Hepatitis C Screening  Never done   TETANUS/TDAP  07/06/2018   COVID-19 Vaccine (4 - Booster for Moderna series) 01/18/2021    Lung Cancer Screening: (Low Dose CT Chest recommended if Age 30-80 years, 30 pack-year currently smoking OR have quit w/in 15years.) does not qualify.   Additional Screening:  Hepatitis C Screening: does qualify; du  Vision Screening: Recommended annual ophthalmology exams for early detection of glaucoma and other disorders of the eye. Is the patient up to date with their annual eye exam?  Yes  Who is the provider or what is the name of the office in which the patient attends annual eye exams? [facility] If pt is not established with a provider, would they like to be referred to a provider to establish care? No .   Dental Screening: Recommended annual dental exams for proper oral hygiene  Community Resource Referral / Chronic Care Management: CRR required this visit?  No   CCM required this visit?  No      Plan:     I have personally reviewed and noted the following in the patients chart:   Medical and social history Use of alcohol, tobacco or illicit drugs  Current medications and supplements including opioid prescriptions. Patient is not currently taking opioid prescriptions. Functional ability and status Nutritional status Physical activity Advanced directives List of other physicians Hospitalizations, surgeries, and ER visits in previous 12 months Vitals Screenings to include cognitive, depression, and falls Referrals and appointments  In addition, I have reviewed and discussed with patient certain preventive protocols, quality metrics, and best practice recommendations. A written personalized care plan for preventive services as well as general preventive health recommendations were provided to patient.  Sandrea Hammond, LPN   [id number]   Nurse Notes: None

## 2021-11-29 NOTE — Progress Notes (Signed)
Virtual Visit  Note Due to COVID-19 pandemic this visit was conducted virtually. This visit type was conducted due to national recommendations for restrictions regarding the COVID-19 Pandemic (e.g. social distancing, sheltering in place) in an effort to limit this patient's exposure and mitigate transmission in our community. All issues noted in this document were discussed and addressed.  A physical exam was not performed with this format.  I connected with James Hoffman 's aunt on 11/29/21 at 2:20 pm  by telephone and verified that I am speaking with the correct person using two identifiers. James Hoffman is currently located at home and aunt is currently with him during visit. The provider, Evelina Dun, FNP is located in their office at time of visit.  I discussed the limitations, risks, security and privacy concerns of performing an evaluation and management service by telephone and the availability of in person appointments. I also discussed with the patient that there may be a patient responsible charge related to this service. The patient expressed understanding and agreed to proceed.  James Hoffman, James Hoffman are scheduled for a virtual visit with your provider today.    Just as we do with appointments in the office, we must obtain your consent to participate.  Your consent will be active for this visit and any virtual visit you may have with one of our providers in the next 365 days.    If you have a MyChart account, I can also send a copy of this consent to you electronically.  All virtual visits are billed to your insurance company just like a traditional visit in the office.  As this is a virtual visit, video technology does not allow for your provider to perform a traditional examination.  This may limit your provider's ability to fully assess your condition.  If your provider identifies any concerns that need to be evaluated in person or the need to arrange testing such as labs, EKG,  etc, we will make arrangements to do so.    Although advances in technology are sophisticated, we cannot ensure that it will always work on either your end or our end.  If the connection with a video visit is poor, we may have to switch to a telephone visit.  With either a video or telephone visit, we are not always able to ensure that we have a secure connection.   I need to obtain your verbal consent now.   Are you willing to proceed with your visit today?   James Hoffman has provided verbal consent on 11/29/2021 for a virtual visit (video or telephone).   Evelina Dun, [place] 11/29/2021  2:11 PM    History and Present Illness:  Cough This is a new problem. The problem has been gradually worsening. The problem occurs every few minutes. The cough is Productive of sputum. Associated symptoms include ear congestion, headaches, nasal congestion, postnasal drip, shortness of breath and wheezing. Pertinent negatives include no chills, ear pain or fever. He has tried rest and OTC cough suppressant for the symptoms. The treatment provided mild relief. His past medical history is significant for asthma.     Review of Systems  Constitutional:  Negative for chills and fever.  HENT:  Positive for postnasal drip. Negative for ear pain.   Respiratory:  Positive for cough, shortness of breath and wheezing.   Neurological:  Positive for headaches.    Observations/Objective: No SOB or distress noted, aunt did most of the talking. Coarse cough.   Assessment  and Plan: 1. Acute bacterial bronchitis - Take meds as prescribed - Use a cool mist humidifier  -Use saline nose sprays frequently -Force fluids -For any cough or congestion  Use plain Mucinex- regular strength or max strength is fine -For fever or aces or pains- take tylenol or ibuprofen. -Throat lozenges if help -Follow up if symptoms worsen or do not improve  - doxycycline (VIBRA-TABS) 100 MG tablet; Take 1 tablet (100 mg total) by  mouth 2 (two) times daily.  Dispense: 20 tablet; Refill: 0 - fluticasone (FLONASE) 50 MCG/ACT nasal spray; Place 2 sprays into both nostrils daily.  Dispense: 16 g; Refill: 6     I discussed the assessment and treatment plan with the patient. The patient was provided an opportunity to ask questions and all were answered. The patient agreed with the plan and demonstrated an understanding of the instructions.   The patient was advised to call back or seek an in-person evaluation if the symptoms worsen or if the condition fails to improve as anticipated.  The above assessment and management plan was discussed with the patient. The patient verbalized understanding of and has agreed to the management plan. Patient is aware to call the clinic if symptoms persist or worsen. Patient is aware when to return to the clinic for a follow-up visit. Patient educated on when it is appropriate to go to the emergency department.   Time call ended:  2:31 pm   I provided 11 minutes of  non face-to-face time during this encounter.    Evelina Dun, FNP

## 2021-11-29 NOTE — Patient Instructions (Signed)
James Hoffman , Thank you for taking time to come for your Medicare Wellness Visit. I appreciate your ongoing commitment to your health goals. Please review the following plan we discussed and let me know if I can assist you in the future.   Screening recommendations/referrals: Colonoscopy: due at age 26 Recommended yearly ophthalmology/optometry visit for glaucoma screening and checkup Recommended yearly dental visit for hygiene and checkup  Vaccinations: Influenza vaccine: Done 08/17/2021 - Repeat annually  Pneumococcal vaccine: Done 05/18/2021  Tdap vaccine: Done 07/06/2008 - Repeat in 10 years *due Shingles vaccine: due at age 77 Covid-19: Done 02/17/2020, 03/16/2020, 11/23/2020  Advanced directives: Please bring a copy of your health care power of attorney and living will to the office to be added to your chart at your convenience.   Conditions/risks identified: Aim for 30 minutes of exercise each day, drink 6-8 glasses of water and eat lots of fruits and vegetables.   Next appointment: Follow up in one year for your annual wellness visit   Preventive Care 25-64 Years, Male Preventive care refers to lifestyle choices and visits with your health care provider that can promote health and wellness. What does preventive care include? A yearly physical exam. This is also called an annual well check. Dental exams once or twice a year. Routine eye exams. Ask your health care provider how often you should have your eyes checked. Personal lifestyle choices, including: Daily care of your teeth and gums. Regular physical activity. Eating a healthy diet. Avoiding tobacco and drug use. Limiting alcohol use. Practicing safe sex. Taking low-dose aspirin every day starting at age 28. What happens during an annual well check? The services and screenings done by your health care provider during your annual well check will depend on your age, overall health, lifestyle risk factors, and family history of  disease. Counseling  Your health care provider may ask you questions about your: Alcohol use. Tobacco use. Drug use. Emotional well-being. Home and relationship well-being. Sexual activity. Eating habits. Work and work Statistician. Screening  You may have the following tests or measurements: Height, weight, and BMI. Blood pressure. Lipid and cholesterol levels. These may be checked every 5 years, or more frequently if you are over 37 years old. Skin check. Lung cancer screening. You may have this screening every year starting at age 31 if you have a 30-pack-year history of smoking and currently smoke or have quit within the past 15 years. Fecal occult blood test (FOBT) of the stool. You may have this test every year starting at age 66. Flexible sigmoidoscopy or colonoscopy. You may have a sigmoidoscopy every 5 years or a colonoscopy every 10 years starting at age 63. Prostate cancer screening. Recommendations will vary depending on your family history and other risks. Hepatitis C blood test. Hepatitis B blood test. Sexually transmitted disease (STD) testing. Diabetes screening. This is done by checking your blood sugar (glucose) after you have not eaten for a while (fasting). You may have this done every 1-3 years. Discuss your test results, treatment options, and if necessary, the need for more tests with your health care provider. Vaccines  Your health care provider may recommend certain vaccines, such as: Influenza vaccine. This is recommended every year. Tetanus, diphtheria, and acellular pertussis (Tdap, Td) vaccine. You may need a Td booster every 10 years. Zoster vaccine. You may need this after age 69. Pneumococcal 13-valent conjugate (PCV13) vaccine. You may need this if you have certain conditions and have not been vaccinated. Pneumococcal polysaccharide (PPSV23)  vaccine. You may need one or two doses if you smoke cigarettes or if you have certain conditions. Talk to your  health care provider about which screenings and vaccines you need and how often you need them. This information is not intended to replace advice given to you by your health care provider. Make sure you discuss any questions you have with your health care provider. Document Released: 11/26/2015 Document Revised: 07/19/2016 Document Reviewed: 08/31/2015 Elsevier Interactive Patient Education  2017 [place] Prevention in the Home Falls can cause injuries. They can happen to people of all ages. There are many things you can do to make your home safe and to help prevent falls. What can I do on the outside of my home? Regularly fix the edges of walkways and driveways and fix any cracks. Remove anything that might make you trip as you walk through a door, such as a raised step or threshold. Trim any bushes or trees on the path to your home. Use bright outdoor lighting. Clear any walking paths of anything that might make someone trip, such as rocks or tools. Regularly check to see if handrails are loose or broken. Make sure that both sides of any steps have handrails. Any raised decks and porches should have guardrails on the edges. Have any leaves, snow, or ice cleared regularly. Use sand or salt on walking paths during winter. Clean up any spills in your garage right away. This includes oil or grease spills. What can I do in the bathroom? Use night lights. Install grab bars by the toilet and in the tub and shower. Do not use towel bars as grab bars. Use non-skid mats or decals in the tub or shower. If you need to sit down in the shower, use a plastic, non-slip stool. Keep the floor dry. Clean up any water that spills on the floor as soon as it happens. Remove soap buildup in the tub or shower regularly. Attach bath mats securely with double-sided non-slip rug tape. Do not have throw rugs and other things on the floor that can make you trip. What can I do in the bedroom? Use night  lights. Make sure that you have a light by your bed that is easy to reach. Do not use any sheets or blankets that are too big for your bed. They should not hang down onto the floor. Have a firm chair that has side arms. You can use this for support while you get dressed. Do not have throw rugs and other things on the floor that can make you trip. What can I do in the kitchen? Clean up any spills right away. Avoid walking on wet floors. Keep items that you use a lot in easy-to-reach places. If you need to reach something above you, use a strong step stool that has a grab bar. Keep electrical cords out of the way. Do not use floor polish or wax that makes floors slippery. If you must use wax, use non-skid floor wax. Do not have throw rugs and other things on the floor that can make you trip. What can I do with my stairs? Do not leave any items on the stairs. Make sure that there are handrails on both sides of the stairs and use them. Fix handrails that are broken or loose. Make sure that handrails are as long as the stairways. Check any carpeting to make sure that it is firmly attached to the stairs. Fix any carpet that is loose or  worn. Avoid having throw rugs at the top or bottom of the stairs. If you do have throw rugs, attach them to the floor with carpet tape. Make sure that you have a light switch at the top of the stairs and the bottom of the stairs. If you do not have them, ask someone to add them for you. What else can I do to help prevent falls? Wear shoes that: Do not have high heels. Have rubber bottoms. Are comfortable and fit you well. Are closed at the toe. Do not wear sandals. If you use a stepladder: Make sure that it is fully opened. Do not climb a closed stepladder. Make sure that both sides of the stepladder are locked into place. Ask someone to hold it for you, if possible. Clearly mark and make sure that you can see: Any grab bars or handrails. First and last  steps. Where the edge of each step is. Use tools that help you move around (mobility aids) if they are needed. These include: Canes. Walkers. Scooters. Crutches. Turn on the lights when you go into a dark area. Replace any light bulbs as soon as they burn out. Set up your furniture so you have a clear path. Avoid moving your furniture around. If any of your floors are uneven, fix them. If there are any pets around you, be aware of where they are. Review your medicines with your doctor. Some medicines can make you feel dizzy. This can increase your chance of falling. Ask your doctor what other things that you can do to help prevent falls. This information is not intended to replace advice given to you by your health care provider. Make sure you discuss any questions you have with your health care provider. Document Released: 08/26/2009 Document Revised: 04/06/2016 Document Reviewed: 12/04/2014 Elsevier Interactive Patient Education  2017 [employer].

## 2021-12-07 ENCOUNTER — Telehealth: Payer: Medicare Other

## 2021-12-13 DIAGNOSIS — R059 Cough, unspecified: Secondary | ICD-10-CM | POA: Diagnosis not present

## 2021-12-13 DIAGNOSIS — I959 Hypotension, unspecified: Secondary | ICD-10-CM | POA: Diagnosis not present

## 2021-12-13 DIAGNOSIS — E230 Hypopituitarism: Secondary | ICD-10-CM | POA: Diagnosis not present

## 2021-12-13 DIAGNOSIS — I10 Essential (primary) hypertension: Secondary | ICD-10-CM | POA: Diagnosis not present

## 2021-12-13 DIAGNOSIS — D72829 Elevated white blood cell count, unspecified: Secondary | ICD-10-CM | POA: Diagnosis not present

## 2021-12-13 DIAGNOSIS — E2749 Other adrenocortical insufficiency: Secondary | ICD-10-CM | POA: Diagnosis not present

## 2021-12-13 DIAGNOSIS — R4182 Altered mental status, unspecified: Secondary | ICD-10-CM | POA: Diagnosis not present

## 2021-12-13 DIAGNOSIS — E038 Other specified hypothyroidism: Secondary | ICD-10-CM | POA: Diagnosis not present

## 2021-12-13 DIAGNOSIS — Z79890 Hormone replacement therapy (postmenopausal): Secondary | ICD-10-CM | POA: Diagnosis not present

## 2021-12-13 DIAGNOSIS — R0902 Hypoxemia: Secondary | ICD-10-CM | POA: Diagnosis not present

## 2021-12-13 DIAGNOSIS — R231 Pallor: Secondary | ICD-10-CM | POA: Diagnosis not present

## 2021-12-13 DIAGNOSIS — E871 Hypo-osmolality and hyponatremia: Secondary | ICD-10-CM | POA: Diagnosis not present

## 2021-12-13 DIAGNOSIS — Z20822 Contact with and (suspected) exposure to covid-19: Secondary | ICD-10-CM | POA: Diagnosis not present

## 2021-12-13 DIAGNOSIS — R0602 Shortness of breath: Secondary | ICD-10-CM | POA: Diagnosis not present

## 2021-12-13 DIAGNOSIS — R531 Weakness: Secondary | ICD-10-CM | POA: Diagnosis not present

## 2021-12-16 ENCOUNTER — Encounter: Payer: Self-pay | Admitting: Nurse Practitioner

## 2021-12-16 ENCOUNTER — Ambulatory Visit (INDEPENDENT_AMBULATORY_CARE_PROVIDER_SITE_OTHER): Payer: Medicare Other | Admitting: Nurse Practitioner

## 2021-12-16 VITALS — BP 119/87 | HR 91 | Temp 97.1°F | Resp 20 | Ht 70.0 in | Wt 126.0 lb

## 2021-12-16 DIAGNOSIS — E860 Dehydration: Secondary | ICD-10-CM | POA: Diagnosis not present

## 2021-12-16 DIAGNOSIS — Z09 Encounter for follow-up examination after completed treatment for conditions other than malignant neoplasm: Secondary | ICD-10-CM

## 2021-12-16 DIAGNOSIS — E871 Hypo-osmolality and hyponatremia: Secondary | ICD-10-CM

## 2021-12-16 LAB — CMP14+EGFR
ALT: 22 IU/L (ref 0–44)
AST: 18 IU/L (ref 0–40)
Albumin/Globulin Ratio: 2.4 — ABNORMAL HIGH (ref 1.2–2.2)
Albumin: 4 g/dL — ABNORMAL LOW (ref 4.1–5.2)
Alkaline Phosphatase: 138 IU/L — ABNORMAL HIGH (ref 44–121)
BUN/Creatinine Ratio: 8 — ABNORMAL LOW (ref 9–20)
BUN: 6 mg/dL (ref 6–20)
Bilirubin Total: 0.2 mg/dL (ref 0.0–1.2)
CO2: 29 mmol/L (ref 20–29)
Calcium: 9.4 mg/dL (ref 8.7–10.2)
Chloride: 92 mmol/L — ABNORMAL LOW (ref 96–106)
Creatinine, Ser: 0.75 mg/dL — ABNORMAL LOW (ref 0.76–1.27)
Globulin, Total: 1.7 g/dL (ref 1.5–4.5)
Glucose: 75 mg/dL (ref 70–99)
Potassium: 3.6 mmol/L (ref 3.5–5.2)
Sodium: 134 mmol/L (ref 134–144)
Total Protein: 5.7 g/dL — ABNORMAL LOW (ref 6.0–8.5)
eGFR: 128 mL/min/{1.73_m2} (ref >59)

## 2021-12-16 LAB — CBC WITH DIFFERENTIAL/PLATELET
Basophils Absolute: 0.1 10*3/uL (ref 0.0–0.2)
Basos: 1 %
EOS (ABSOLUTE): 0.3 10*3/uL (ref 0.0–0.4)
Eos: 2 %
Hematocrit: 41.2 % (ref 37.5–51.0)
Hemoglobin: 14.5 g/dL (ref 13.0–17.7)
Immature Grans (Abs): 0.1 10*3/uL (ref 0.0–0.1)
Immature Granulocytes: 1 %
Lymphocytes Absolute: 3.2 10*3/uL — ABNORMAL HIGH (ref 0.7–3.1)
Lymphs: 27 %
MCH: 32.4 pg (ref 26.6–33.0)
MCHC: 35.2 g/dL (ref 31.5–35.7)
MCV: 92 fL (ref 79–97)
Monocytes Absolute: 1.1 10*3/uL — ABNORMAL HIGH (ref 0.1–0.9)
Monocytes: 9 %
Neutrophils Absolute: 7.2 10*3/uL — ABNORMAL HIGH (ref 1.4–7.0)
Neutrophils: 60 %
Platelets: 377 10*3/uL (ref 150–450)
RBC: 4.48 x10E6/uL (ref 4.14–5.80)
RDW: 12 % (ref 11.6–15.4)
WBC: 12 10*3/uL — ABNORMAL HIGH (ref 3.4–10.8)

## 2021-12-16 NOTE — Progress Notes (Signed)
° °  Subjective:    Patient ID: James Hoffman, male    DOB: March 05, 1996, 26 y.o.   MRN: [id number]   Chief Complaint: Hospitalization Follow-up (Dehydration and BP low/)   HPI Patient was taken to the ED on 12/13/21 with SOB. She was dx with mild hyponatremia and leukocytosis. She was told to increase her steroid dose while sick and once better back to regular dose. Family refused IV fluids in the ED and just said they would force fluids at home. Has been drinking gatorade and is doing better. Feels much better. Needs labs repeated.    Review of Systems  Constitutional:  Negative for diaphoresis.  Eyes:  Negative for pain.  Respiratory:  Negative for shortness of breath.   Cardiovascular:  Negative for chest pain, palpitations and leg swelling.  Gastrointestinal:  Negative for abdominal pain.  Endocrine: Negative for polydipsia.  Skin:  Negative for rash.  Neurological:  Negative for dizziness, weakness and headaches.  Hematological:  Does not bruise/bleed easily.  All other systems reviewed and are negative.     Objective:   Physical Exam Vitals reviewed.  Constitutional:      Appearance: Normal appearance. He is obese.  HENT:     Nose: Nose normal.     Mouth/Throat:     Mouth: Mucous membranes are moist.  Cardiovascular:     Rate and Rhythm: Normal rate and regular rhythm.     Heart sounds: Normal heart sounds.  Pulmonary:     Effort: Pulmonary effort is normal.     Breath sounds: Normal breath sounds.  Musculoskeletal:     Comments:  Wheel chair  Skin:    General: Skin is warm.  Neurological:     Mental Status: He is alert. He is disoriented.    BP 119/87    Pulse 91    Temp (!) 97.1 F (36.2 C) (Temporal)    Resp 20    Ht $R'5\' 10"'Xh$  (1.778 m)    Wt 126 lb (57.2 kg)    SpO2 96%    BMI 18.08 kg/m        Assessment & Plan:   James Hoffman in today with chief complaint of Hospitalization Follow-up (Dehydration and BP low/)   1. Dehydration Continue to  force fluids Labs pending - CBC with Differential/Platelet - CMP14+EGFR  2. Hyponatremia - CBC with Differential/Platelet - CMP14+EGFR  3. Hospital discharge follow-up Hospital records reviewed    The above assessment and management plan was discussed with the patient. The patient verbalized understanding of and has agreed to the management plan. Patient is aware to call the clinic if symptoms persist or worsen. Patient is aware when to return to the clinic for a follow-up visit. Patient educated on when it is appropriate to go to the emergency department.   [name], FNP

## 2021-12-16 NOTE — Patient Instructions (Addendum)
Dehydration, Adult Dehydration is condition in which there is not enough water or other fluids in the body. This happens when a person loses more fluids than he or she takes in. Important body parts cannot work right without the right amount of fluids. Any loss of fluids from the body can cause dehydration. Dehydration can be mild, worse, or very bad. It should be treated right away to keep it from getting very bad. AVOID SOFT DRINKS!!!! What are the causes? This condition may be caused by: Conditions that cause loss of water or other fluids, such as: Watery poop (diarrhea). Vomiting. Sweating a lot. Peeing (urinating) a lot. Not drinking enough fluids, especially when you: Are ill. Are doing things that take a lot of energy to do. Other illnesses and conditions, such as fever or infection. Certain medicines, such as medicines that take extra fluid out of the body (diuretics). Lack of safe drinking water. Not being able to get enough water and food. What increases the risk? The following factors may make you more likely to develop this condition: Having a long-term (chronic) illness that has not been treated the right way, such as: Diabetes. Heart disease. Kidney disease. Being 81 years of age or older. Having a disability. Living in a place that is high above the ground or sea (high in altitude). The thinner, dried air causes more fluid loss. Doing exercises that put stress on your body for a long time. What are the signs or symptoms? Symptoms of dehydration depend on how bad it is. Mild or worse dehydration Thirst. Dry lips or dry mouth. Feeling dizzy or light-headed, especially when you stand up from sitting. Muscle cramps. Your body making: Dark pee (urine). Pee may be the color of tea. Less pee than normal. Less tears than normal. Headache. Very bad dehydration Changes in skin. Skin may: Be cold to the touch (clammy). Be blotchy or pale. Not go back to normal right  after you lightly pinch it and let it go. Little or no tears, pee, or sweat. Changes in vital signs, such as: Fast breathing. Low blood pressure. Weak pulse. Pulse that is more than 100 beats a minute when you are sitting still. Other changes, such as: Feeling very thirsty. Eyes that look hollow (sunken). Cold hands and feet. Being mixed up (confused). Being very tired (lethargic) or having trouble waking from sleep. Short-term weight loss. Loss of consciousness. How is this treated? Treatment for this condition depends on how bad it is. Treatment should start right away. Do not wait until your condition gets very bad. Very bad dehydration is an emergency. You will need to go to a hospital. Mild or worse dehydration can be treated at home. You may be asked to: Drink more fluids. Drink an oral rehydration solution (ORS). This drink helps get the right amounts of fluids and salts and minerals in the blood (electrolytes). Very bad dehydration can be treated: With fluids through an IV tube. By getting normal levels of salts and minerals in your blood. This is often done by giving salts and minerals through a tube. The tube is passed through your nose and into your stomach. By treating the root cause. Follow these instructions at home: Oral rehydration solution If told by your doctor, drink an ORS: Make an ORS. Use instructions on the package. Start by drinking small amounts, about  cup (120 mL) every 5-10 minutes. Slowly drink more until you have had the amount that your doctor said to have. Eating and drinking  Drink enough clear fluid to keep your pee pale yellow. If you were told to drink an ORS, finish the ORS first. Then, start slowly drinking other clear fluids. Drink fluids such as: Water. Do not drink only water. Doing that can make the salt (sodium) level in your body get too low. Water from ice chips you suck on. Fruit juice that you have added water to  (diluted). Low-calorie sports drinks. Eat foods that have the right amounts of salts and minerals, such as: Bananas. Oranges. Potatoes. Tomatoes. Spinach. Do not drink alcohol. Avoid: Drinks that have a lot of sugar. These include: High-calorie sports drinks. Fruit juice that you did not add water to. Soda. Caffeine. Foods that are greasy or have a lot of fat or sugar. General instructions Take over-the-counter and prescription medicines only as told by your doctor. Do not take salt tablets. Doing that can make the salt level in your body get too high. Return to your normal activities as told by your doctor. Ask your doctor what activities are safe for you. Keep all follow-up visits as told by your doctor. This is important. Contact a doctor if: You have pain in your belly (abdomen) and the pain: Gets worse. Stays in one place. You have a rash. You have a stiff neck. You get angry or annoyed (irritable) more easily than normal. You are more tired or have a harder time waking than normal. You feel: Weak or dizzy. Very thirsty. Get help right away if you have: Any symptoms of very bad dehydration. Symptoms of vomiting, such as: You cannot eat or drink without vomiting. Your vomiting gets worse or does not go away. Your vomit has blood or green stuff in it. Symptoms that get worse with treatment. A fever. A very bad headache. Problems with peeing or pooping (having a bowel movement), such as: Watery poop that gets worse or does not go away. Blood in your poop (stool). This may cause poop to look black and tarry. Not peeing in 6-8 hours. Peeing only a small amount of very dark pee in 6-8 hours. Trouble breathing. These symptoms may be an emergency. Do not wait to see if the symptoms will go away. Get medical help right away. Call your local emergency services (911 in the [place] Do not drive yourself to the hospital. Summary Dehydration is a condition in which there is not  enough water or other fluids in the body. This happens when a person loses more fluids than he or she takes in. Treatment for this condition depends on how bad it is. Treatment should be started right away. Do not wait until your condition gets very bad. Drink enough clear fluid to keep your pee pale yellow. If you were told to drink an oral rehydration solution (ORS), finish the ORS first. Then, start slowly drinking other clear fluids. Take over-the-counter and prescription medicines only as told by your doctor. Get help right away if you have any symptoms of very bad dehydration. This information is not intended to replace advice given to you by your health care provider. Make sure you discuss any questions you have with your health care provider. Document Revised: 06/12/2019 Document Reviewed: 06/12/2019 Elsevier Patient Education  [employer].

## 2021-12-19 MED ORDER — POTASSIUM CHLORIDE CRYS ER 20 MEQ PO TBCR: 20.0000 meq | EXTENDED_RELEASE_TABLET | Freq: Two times a day (BID) | ORAL | 2 refills | Status: DC

## 2021-12-19 NOTE — Addendum Note (Signed)
Addended by: Chevis Pretty on: 12/19/2021 01:22 PM   Modules accepted: Orders

## 2021-12-20 ENCOUNTER — Telehealth: Payer: Self-pay | Admitting: Family Medicine

## 2021-12-20 DIAGNOSIS — E876 Hypokalemia: Secondary | ICD-10-CM

## 2021-12-20 DIAGNOSIS — C716 Malignant neoplasm of cerebellum: Secondary | ICD-10-CM | POA: Diagnosis not present

## 2021-12-20 DIAGNOSIS — D72829 Elevated white blood cell count, unspecified: Secondary | ICD-10-CM

## 2021-12-20 NOTE — Telephone Encounter (Signed)
James Hoffman aware 

## 2021-12-26 ENCOUNTER — Other Ambulatory Visit: Payer: Self-pay | Admitting: Nurse Practitioner

## 2021-12-26 NOTE — Telephone Encounter (Signed)
I was not aware this was a chronic med for pt.  His last potassium level was normal.

## 2021-12-27 ENCOUNTER — Ambulatory Visit (INDEPENDENT_AMBULATORY_CARE_PROVIDER_SITE_OTHER): Payer: Medicare Other

## 2021-12-27 ENCOUNTER — Other Ambulatory Visit: Payer: Medicare Other

## 2021-12-27 DIAGNOSIS — C716 Malignant neoplasm of cerebellum: Secondary | ICD-10-CM

## 2021-12-27 DIAGNOSIS — E890 Postprocedural hypothyroidism: Secondary | ICD-10-CM

## 2021-12-27 DIAGNOSIS — E876 Hypokalemia: Secondary | ICD-10-CM | POA: Diagnosis not present

## 2021-12-27 DIAGNOSIS — D72829 Elevated white blood cell count, unspecified: Secondary | ICD-10-CM | POA: Diagnosis not present

## 2021-12-27 DIAGNOSIS — F322 Major depressive disorder, single episode, severe without psychotic features: Secondary | ICD-10-CM

## 2021-12-27 DIAGNOSIS — Z87898 Personal history of other specified conditions: Secondary | ICD-10-CM

## 2021-12-27 DIAGNOSIS — M818 Other osteoporosis without current pathological fracture: Secondary | ICD-10-CM

## 2021-12-27 DIAGNOSIS — I9589 Other hypotension: Secondary | ICD-10-CM

## 2021-12-27 NOTE — Telephone Encounter (Signed)
I tried to call pt's grandmother and his aunt and was unable to get through on either line, one was busy and the other had a full mailbox.

## 2021-12-27 NOTE — Chronic Care Management (AMB) (Signed)
Chronic Care Management    Clinical Social Work Note  12/27/2021 Name: James Hoffman MRN: [id number] DOB: 11/25/1995  James Hoffman is a 26 y.o. year old male who is a primary care patient of [name], DO. The CCM team was consulted to assist the patient with chronic disease management and/or care coordination needs related to: [name]   Engaged with patient/ aunt of client, James Hoffman, by telephone for follow up visit in response to provider referral for social work chronic care management and care coordination services.   Consent to Services:  The patient was given information about Chronic Care Management services, agreed to services, and gave verbal consent prior to initiation of services.  Please see initial visit note for detailed documentation.   Patient agreed to services and consent obtained.   Assessment: Review of patient past medical history, allergies, medications, and health status, including review of relevant consultants reports was performed today as part of a comprehensive evaluation and provision of chronic care management and care coordination services.     SDOH (Social Determinants of Health) assessments and interventions performed:  SDOH Interventions    Flowsheet Row Most Recent Value  SDOH Interventions   Stress Interventions Other (Comment)  [client has stress related to managing medical needs. client has hearing issues,vision challenges, mobility challenges]  Depression Interventions/Treatment  Medication        Advanced Directives Status: See Vynca application for related entries.  CCM Care Plan  Allergies  Allergen Reactions   Tramadol Anaphylaxis   Morphine Nausea And Vomiting    According to mother the patient is intolerant of morphine; causes nausea and vomiting.  Has had none since he was 26yo.    Outpatient Encounter Medications as of 12/27/2021  Medication Sig   atorvastatin (LIPITOR) 40 MG tablet TAKE 1  TABLET BY MOUTH EVERYDAY AT BEDTIME   busPIRone (BUSPAR) 5 MG tablet Take 1 tablet (5 mg total) by mouth 3 (three) times daily. For Anxiety   cetirizine (ZYRTEC) 10 MG tablet Take 1 tablet (10 mg total) by mouth daily.   Cholecalciferol 50 MCG (2000 UT) CAPS Take by mouth.   clotrimazole-betamethasone (LOTRISONE) cream APPLY 1 APPLICATION TOPICALLY 2 (TWO) TIMES DAILY. X 7-10 DAYS   dapsone 100 MG tablet Take 1 tablet (100 mg total) by mouth daily.   escitalopram (LEXAPRO) 10 MG tablet Take 1 tablet (10 mg total) by mouth daily.   fluconazole (DIFLUCAN) 100 MG tablet Take two with first dose. Then starting the next day take one daily until all are taken.   fluticasone (FLONASE) 50 MCG/ACT nasal spray Place 2 sprays into both nostrils daily.   fluticasone (FLOVENT HFA) 44 MCG/ACT inhaler Inhale 2 puffs into the lungs 2 (two) times daily.   ibuprofen (ADVIL) 600 MG tablet TAKE 1 TABLET (600 MG TOTAL) BY MOUTH EVERY 8 (EIGHT) HOURS AS NEEDED FOR MODERATE PAIN.   levETIRAcetam (KEPPRA) 750 MG tablet Take 1,500 mg by mouth in the morning and at bedtime.   levothyroxine (SYNTHROID) 88 MCG tablet Take 88 mcg by mouth daily before breakfast.   omeprazole (PRILOSEC) 20 MG capsule TAKE 1 CAPSULE (20 MG TOTAL) BY MOUTH DAILY. AT LUNCH TIME.   potassium chloride SA (KLOR-CON M) 20 MEQ tablet Take 1 tablet (20 mEq total) by mouth 2 (two) times daily.   Spacer/Aero-Holding Chambers (AEROCHAMBER PLUS) inhaler Use as instructed   Testosterone 20.25 MG/ACT (1.62%) GEL ONE PUM ON EACH SHOULDER EVERY MORNIG   thiamine 100 MG  tablet Take by mouth.   tiZANidine (ZANAFLEX) 4 MG tablet TAKE 1/2 TO 1 TABLET (2-4 MG TOTAL) BY MOUTH EVERY 8 (EIGHT) HOURS AS NEEDED FOR MUSCLE SPASMS.   VENTOLIN HFA 108 (90 Base) MCG/ACT inhaler TAKE 2 PUFFS BY MOUTH EVERY 6 HOURS AS NEEDED FOR WHEEZE OR SHORTNESS OF BREATH   No facility-administered encounter medications on file as of 12/27/2021.    Patient Active Problem List    Diagnosis Date Noted   Abscess after procedure 06/12/2021   AVN (avascular necrosis of bone) ([place]) 05/30/2021   'light-for-dates' infant with signs of fetal malnutrition 02/11/2021   Partial symptomatic epilepsy with complex partial seizures, intractable, without status epilepticus ([place]) 09/13/2020   Left arm weakness 09/09/2020   Sepsis ([place]) 09/09/2020   Aspiration pneumonia of right lung ([place]) 09/09/2020   Impaired functional mobility, balance, gait, and endurance 08/15/2020   Leukocytosis (leucocytosis) 08/15/2020   Abnormal chest x-ray 06/24/2020   Decreased lung sounds 06/24/2020   Wound check, abscess 05/20/2020   Depression, major, single episode, severe ([place]) 05/20/2020   Abscess of right buttock 04/06/2020   Impacted cerumen of both ears 02/17/2020   Disorientation 02/12/2020   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[place]) 12/09/2019   Arthritis of left hip 10/31/2019   Left leg pain 10/31/2019   Blindness and low vision 10/29/2019   History of eye surgery 10/29/2019   History of brain tumor 10/29/2019   Encephalopathy 03/03/2019   Hydrocephalus ([place]) 03/03/2019   S/P VP shunt 03/03/2019   Hearing loss of left ear 05/12/2018   Acute respiratory failure with hypoxia ([place]) 03/20/2018   Low bone mass 06/21/2017   At risk for falls 03/13/2017   Current chronic use of systemic steroids 03/13/2017   Current smoker 03/13/2017   Drug-induced osteoporosis 03/13/2017   Excessive caffeine intake 03/13/2017   History of chemotherapy 03/13/2017   Low testosterone in male 03/13/2017   History of radiation therapy 03/13/2017   Status post orthopedic surgery, follow-up exam 02/22/2017   SIRS (systemic inflammatory response syndrome) ([place]) 02/13/2017   Closed displaced subtrochanteric fracture of left femur ([place]) 02/12/2017   Gait instability 12/19/2016   Hypogonadism, male 07/19/2016   Normocytic anemia 04/14/2016   Muscular deconditioning 09/17/2015   Hypothyroidism 09/09/2015   Medullary carcinoma  ([place]) 09/09/2015   Adrenal insufficiency ([place]) 09/09/2015   Hypotension 09/09/2015   Hyponatremia 09/09/2015   Hypokalemia 09/09/2015   HSP (Hen[place]onlein purpura) ([place]) 11/11/2013   Secondary adrenal insufficiency ([place]) 11/11/2013   Left anterior knee pain 05/29/2013   Patellofemoral pain syndrome 05/29/2013   Exercise induced bronchospasm 03/07/2013   Posterior subcapsular cataract 01/09/2013   Pseudophakia of right eye 01/09/2013   Headache 03/15/2012   Sensorineural hearing loss (SNHL), bilateral 08/04/2011    Conditions to be addressed/monitored:  monitor client management of depression issues. Monitor client completion of daily activities , with assistance as needed  Care Plan : LCSW care plan  Updates made by Katha Cabal, LCSW since 12/27/2021 12:00 AM     Problem: Depression Identification (Depression)      Goal: Depressive Symptoms Identified: Manage depression sympotms faced   Start Date: 12/27/2021  Expected End Date: 03/23/2022  This Visit's Progress: On track  Recent Progress: On track  Priority: Medium  Note:   Current Barriers:  Chronic Mental Health needs related to depression and depression management Mobility issues Transportation needs Suicidal Ideation/Homicidal Ideation: No  Clinical Social Work Goal(s):  patient Medical illustrator will work with SW monthly by telephone or in person to  reduce or manage symptoms related to depression and depression management for client Patient will attend all scheduled medical appointments in next 30 days Patient or family representative will call RNCM or LCSW as needed in next 30 days for CCM support for client  Interventions: 1:1 collaboration with [name], DO regarding development and update of comprehensive plan of care as evidenced by provider attestation and co-signature Discussed client needs with James Hoffman, aunt of client.  Discussed client appetite. [name] said client eats 3  meals per day plus has some snacks. She said he also drinks Boost supplement occasionally. Discussed upcoming client medical appointments. [name] said client has an appointment with Neurobehavioral Psych at [facility] in February of 2023. Reviewed hearing issues of client. She said client has had about 3 evaluations for hearing needs. She said he has about 15 percent hearing in one of his ears Reviewed client ambulation issues. She said client uses a wheelchair to ambulate when outside of the home.  Discussed  memory challenges of client . [name] said client has memory challenges. She said he has difficulty remembering. She said sometimes he will ask family members same question numerous times. Discussed smoking issues of client. [name] said client smokes over 1/2 pack of cigarettes daily. Reviewed vision issues of client. [name] said that client has reduced vision.  Encouraged client or [name] to call RNCM for CCM nursing support for client Discussed mood status of client. [name] said client is irritable occasionally. She said he yells at home occasionally. This is why they are hoping that client appointment at [facility] this month will help with addressing these issues.  Discussed dehydration issues. [name] said that client is drinking about 3 Gatorades per day.  He is trying to keep hydrated . Patient Self Care Activities:  Attends scheduled medical appointments Has strong family support  Patient Coping Strengths:  Supportive Relationships Family  Patient Self Care Deficits:  Mobility issues Transport needs  Patient Goals:  - spend time or talk with others at least 2 to 3 times per week - practice relaxation or meditation daily - keep a calendar with appointment dates  Follow Up Plan: LCSW to call client or James Hoffman, aunt of client, on 02/23/22 at 2:00 PM to assess client needs     Harvie Morua.Lelania Bia MSW, [place] representative [facility] Care  Management [phone]

## 2021-12-27 NOTE — Patient Instructions (Addendum)
Visit Information  Patient Goals:  Manage Emotions. Manage Depression issues faced.  Timeframe:  Short-Term Goal Priority:  Medium Progress: On Track Start Date:        12/27/21                Expected End Date:       03/20/22            Follow Up Date:  02/23/22 at 2:00 PM   Manage Emotions. Manage Depression issues faced    Why is this important?   When you are stressed, down or upset, your body reacts too.  For example, your blood pressure may get higher; you may have a headache or stomachache.  When your emotions get the best of you, your body's ability to fight off cold and flu gets weak.  These steps will help you manage your emotions.     Patient Self Care Activities:  Attends scheduled medical appointments Has strong family support  Patient Coping Strengths:  Supportive Relationships Family  Patient Self Care Deficits:  Mobility issues Transport needs  Patient Goals:  - spend time or talk with others at least 2 to 3 times per week - practice relaxation or meditation daily - keep a calendar with appointment dates  Follow Up Plan: LCSW to call client or [name], aunt of client, on 02/23/22 at 2:00 PM to assess needs of client   Eliab Closson.Sly Parlee MSW, [place] Holiday representative [facility] Care Management [phone]

## 2021-12-28 LAB — CBC WITH DIFFERENTIAL/PLATELET
Basophils Absolute: 0.1 10*3/uL (ref 0.0–0.2)
Basos: 1 %
EOS (ABSOLUTE): 0.2 10*3/uL (ref 0.0–0.4)
Eos: 1 %
Hematocrit: 42.7 % (ref 37.5–51.0)
Hemoglobin: 14.6 g/dL (ref 13.0–17.7)
Immature Grans (Abs): 0.2 10*3/uL — ABNORMAL HIGH (ref 0.0–0.1)
Immature Granulocytes: 1 %
Lymphocytes Absolute: 1.9 10*3/uL (ref 0.7–3.1)
Lymphs: 11 %
MCH: 31.5 pg (ref 26.6–33.0)
MCHC: 34.2 g/dL (ref 31.5–35.7)
MCV: 92 fL (ref 79–97)
Monocytes Absolute: 1.3 10*3/uL — ABNORMAL HIGH (ref 0.1–0.9)
Monocytes: 8 %
Neutrophils Absolute: 13.2 10*3/uL — ABNORMAL HIGH (ref 1.4–7.0)
Neutrophils: 78 %
Platelets: 402 10*3/uL (ref 150–450)
RBC: 4.63 x10E6/uL (ref 4.14–5.80)
RDW: 12.1 % (ref 11.6–15.4)
WBC: 16.8 10*3/uL — ABNORMAL HIGH (ref 3.4–10.8)

## 2021-12-28 LAB — CMP14+EGFR
ALT: 18 IU/L (ref 0–44)
AST: 19 IU/L (ref 0–40)
Albumin/Globulin Ratio: 2.9 — ABNORMAL HIGH (ref 1.2–2.2)
Albumin: 4.3 g/dL (ref 4.1–5.2)
Alkaline Phosphatase: 172 IU/L — ABNORMAL HIGH (ref 44–121)
BUN/Creatinine Ratio: 7 — ABNORMAL LOW (ref 9–20)
BUN: 5 mg/dL — ABNORMAL LOW (ref 6–20)
Bilirubin Total: <0.2 mg/dL (ref 0.0–1.2)
CO2: 28 mmol/L (ref 20–29)
Calcium: 9.4 mg/dL (ref 8.7–10.2)
Chloride: 90 mmol/L — ABNORMAL LOW (ref 96–106)
Creatinine, Ser: 0.68 mg/dL — ABNORMAL LOW (ref 0.76–1.27)
Globulin, Total: 1.5 g/dL (ref 1.5–4.5)
Glucose: 71 mg/dL (ref 70–99)
Potassium: 4.3 mmol/L (ref 3.5–5.2)
Sodium: 132 mmol/L — ABNORMAL LOW (ref 134–144)
Total Protein: 5.8 g/dL — ABNORMAL LOW (ref 6.0–8.5)
eGFR: 132 mL/min/{1.73_m2} (ref >59)

## 2021-12-28 NOTE — Telephone Encounter (Signed)
Aunt states that when he was seen by MMM his potassium was filled.  He has been taking it daily since then since she thought he was supposed to be on the medication.  Does patient need to stay on medication or come off of it since normal lab work?

## 2021-12-28 NOTE — Telephone Encounter (Signed)
I would favor him holding off on any further potassium supplementation and having him come back in about 2 to 3 weeks for BMP to ensure that potassium stays normal.  This will give [place] a better idea as to whether or not this needs to be reinitiated

## 2021-12-29 ENCOUNTER — Other Ambulatory Visit: Payer: Self-pay | Admitting: Family Medicine

## 2021-12-29 DIAGNOSIS — M25462 Effusion, left knee: Secondary | ICD-10-CM

## 2021-12-29 DIAGNOSIS — M25562 Pain in left knee: Secondary | ICD-10-CM

## 2022-01-10 ENCOUNTER — Ambulatory Visit: Payer: Medicare Other | Admitting: Family Medicine

## 2022-01-10 DIAGNOSIS — E890 Postprocedural hypothyroidism: Secondary | ICD-10-CM

## 2022-01-10 DIAGNOSIS — F322 Major depressive disorder, single episode, severe without psychotic features: Secondary | ICD-10-CM

## 2022-01-10 DIAGNOSIS — M818 Other osteoporosis without current pathological fracture: Secondary | ICD-10-CM

## 2022-01-11 ENCOUNTER — Other Ambulatory Visit: Payer: Self-pay | Admitting: Family Medicine

## 2022-01-11 ENCOUNTER — Encounter: Payer: Self-pay | Admitting: Family Medicine

## 2022-01-11 ENCOUNTER — Telehealth: Payer: Medicare Other

## 2022-01-11 DIAGNOSIS — M25562 Pain in left knee: Secondary | ICD-10-CM

## 2022-01-11 DIAGNOSIS — Z7409 Other reduced mobility: Secondary | ICD-10-CM

## 2022-01-11 NOTE — Telephone Encounter (Signed)
Last office visit 08/29/21 ?Last refill 11/17/21, #30, 2 refills ?

## 2022-02-08 ENCOUNTER — Other Ambulatory Visit: Payer: Self-pay | Admitting: Family Medicine

## 2022-02-09 ENCOUNTER — Encounter: Payer: Self-pay | Admitting: Family Medicine

## 2022-02-09 NOTE — Telephone Encounter (Signed)
[place]. NTBS 30 days given 01/11/22 ?

## 2022-02-09 NOTE — Telephone Encounter (Signed)
Called to make appt, left message on machine. Letter sent.  ?

## 2022-02-12 ENCOUNTER — Other Ambulatory Visit: Payer: Self-pay | Admitting: Family Medicine

## 2022-02-13 ENCOUNTER — Telehealth: Payer: Self-pay | Admitting: Family Medicine

## 2022-02-13 NOTE — Telephone Encounter (Signed)
Made appt for first available on 5/2 and added to the wait list. Patient will be out of medication in a week. Could this be refilled until the appt day?  ?

## 2022-02-13 NOTE — Telephone Encounter (Signed)
Thirty day supply given. ?

## 2022-02-13 NOTE — Telephone Encounter (Signed)
Thirty day supply given. Patient must be seen for any further refills.  ?

## 2022-02-20 ENCOUNTER — Telehealth: Payer: Self-pay

## 2022-02-20 NOTE — Chronic Care Management (AMB) (Signed)
?  Care Management  ? ?Note ? ?02/20/2022 ?Name: UDELL MAZZOCCO MRN: [id number] DOB: February 01, 1996 ? ?BOLDEN HAGERMAN is a 26 y.o. year old male who is a primary care patient of [name], DO and is actively engaged with the care management team. I reached out to [name] by phone today to assist with re-scheduling a follow up visit with the Licensed Clinical Social Worker ? ?Follow up plan: ?Unsuccessful telephone outreach attempt made. A HIPAA compliant phone message was left for the patient providing contact information and requesting a return call.  ?The care management team will reach out to the patient again over the next 7 days.  ?If patient returns call to provider office, please advise to call [place]  at [phone] ? ?[name], RMA ?Care Guide, Embedded Care Coordination ?  Care Management  ?[place], [place] [address] ?Direct Dial: [phone] ?Museum/gallery [email or web address] ?Website: [email or web address]  ? ?

## 2022-02-23 ENCOUNTER — Telehealth: Payer: Medicare Other

## 2022-03-03 NOTE — Chronic Care Management (AMB) (Signed)
?  Care Management  ? ?Note ? ?03/03/2022 ?Name: James Hoffman MRN: [id number] DOB: Jan 16, 1996 ? ?James Hoffman is a 26 y.o. year old male who is a primary care patient of [name], DO and is actively engaged with the care management team. I reached out to [name] by phone today to assist with re-scheduling a follow up visit with the Licensed Clinical Social Worker ? ?Follow up plan: ?Unsuccessful telephone outreach attempt made. A HIPAA compliant phone message was left for the patient providing contact information and requesting a return call.  ?The care management team will reach out to the patient again over the next 7 days.  ?If patient returns call to provider office, please advise to call [place]  at [phone] ? ?[name], RMA ?Care Guide, Embedded Care Coordination ?[name]  Care Management  ?[place], [place] [address] ?Direct Dial: [phone] ?Museum/gallery conservator.[name] ?Website: [email or web address]  ? ?

## 2022-03-09 NOTE — Chronic Care Management (AMB) (Signed)
?  Care Management  ? ?Note ? ?03/09/2022 ?Name: James Hoffman MRN: [id number] DOB: 03/30/1996 ? ?James Hoffman is a 26 y.o. year old male who is a primary care patient of James Norlander, DO and is actively engaged with the care management team. I reached out to James Hoffman by phone today to assist with re-scheduling a follow up visit with the Licensed Clinical Social Worker ? ?Follow up plan: ?Telephone appointment with care management team member scheduled for:03/28/2022 ? ?James Hoffman, RMA ?Care Guide, Embedded Care Coordination ?[place]  Care Management  ?[place],  [address] ?Direct Dial: [phone] ?Museum/gallery conservator.[email or web address] ?Website: [email or web address]  ? ?

## 2022-03-10 ENCOUNTER — Other Ambulatory Visit: Payer: Self-pay | Admitting: Family Medicine

## 2022-03-13 DIAGNOSIS — H6123 Impacted cerumen, bilateral: Secondary | ICD-10-CM | POA: Diagnosis not present

## 2022-03-13 DIAGNOSIS — H6043 Cholesteatoma of external ear, bilateral: Secondary | ICD-10-CM | POA: Diagnosis not present

## 2022-03-13 DIAGNOSIS — H903 Sensorineural hearing loss, bilateral: Secondary | ICD-10-CM | POA: Diagnosis not present

## 2022-03-14 ENCOUNTER — Encounter: Payer: Self-pay | Admitting: Family Medicine

## 2022-03-14 ENCOUNTER — Ambulatory Visit (INDEPENDENT_AMBULATORY_CARE_PROVIDER_SITE_OTHER): Payer: Medicare Other | Admitting: Family Medicine

## 2022-03-14 VITALS — BP 98/71 | HR 91 | Temp 97.7°F | Ht 70.0 in | Wt 127.0 lb

## 2022-03-14 DIAGNOSIS — Z7409 Other reduced mobility: Secondary | ICD-10-CM | POA: Diagnosis not present

## 2022-03-14 DIAGNOSIS — M25562 Pain in left knee: Secondary | ICD-10-CM

## 2022-03-14 DIAGNOSIS — E890 Postprocedural hypothyroidism: Secondary | ICD-10-CM | POA: Diagnosis not present

## 2022-03-14 DIAGNOSIS — M25462 Effusion, left knee: Secondary | ICD-10-CM

## 2022-03-14 DIAGNOSIS — F419 Anxiety disorder, unspecified: Secondary | ICD-10-CM | POA: Diagnosis not present

## 2022-03-14 DIAGNOSIS — L2489 Irritant contact dermatitis due to other agents: Secondary | ICD-10-CM

## 2022-03-14 MED ORDER — ESCITALOPRAM OXALATE 10 MG PO TABS: 10.0000 mg | ORAL_TABLET | Freq: Every day | ORAL | 3 refills | Status: DC

## 2022-03-14 MED ORDER — CELECOXIB 50 MG PO CAPS: 50.0000 mg | ORAL_CAPSULE | Freq: Two times a day (BID) | ORAL | 1 refills | Status: DC | PRN

## 2022-03-14 MED ORDER — CLOTRIMAZOLE-BETAMETHASONE 1-0.05 % EX CREA: TOPICAL_CREAM | CUTANEOUS | 0 refills | Status: DC

## 2022-03-14 MED ORDER — TIZANIDINE HCL 4 MG PO TABS: 2.0000 mg | ORAL_TABLET | Freq: Three times a day (TID) | ORAL | 2 refills | Status: DC | PRN

## 2022-03-14 MED ORDER — CETIRIZINE HCL 10 MG PO TABS
10.0000 mg | ORAL_TABLET | Freq: Every day | ORAL | 11 refills | Status: DC
Start: 2022-03-14 — End: 2023-03-19

## 2022-03-14 MED ORDER — THIAMINE HCL 100 MG PO TABS: 100.0000 mg | ORAL_TABLET | Freq: Every day | ORAL | 3 refills | Status: DC

## 2022-03-14 MED ORDER — BUSPIRONE HCL 10 MG PO TABS: 10.0000 mg | ORAL_TABLET | Freq: Three times a day (TID) | ORAL | 3 refills | Status: DC

## 2022-03-17 ENCOUNTER — Other Ambulatory Visit: Payer: Medicare Other

## 2022-03-17 DIAGNOSIS — M25562 Pain in left knee: Secondary | ICD-10-CM | POA: Diagnosis not present

## 2022-03-17 DIAGNOSIS — M25462 Effusion, left knee: Secondary | ICD-10-CM | POA: Diagnosis not present

## 2022-03-17 DIAGNOSIS — E890 Postprocedural hypothyroidism: Secondary | ICD-10-CM
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
## 2022-03-18 LAB — LIPID PANEL
Chol/HDL Ratio: 3.5 ratio (ref 0.0–5.0)
Cholesterol, Total: 163 mg/dL (ref 100–199)
HDL: 47 mg/dL (ref >39)
LDL Chol Calc (NIH): 84 mg/dL (ref 0–99)
Triglycerides: 186 mg/dL — ABNORMAL HIGH (ref 0–149)
VLDL Cholesterol Cal: 32 mg/dL (ref 5–40)

## 2022-03-18 LAB — CMP14+EGFR
ALT: 19 IU/L (ref 0–44)
AST: 21 IU/L (ref 0–40)
Albumin/Globulin Ratio: 2 (ref 1.2–2.2)
Albumin: 3.8 g/dL — ABNORMAL LOW (ref 4.1–5.2)
Alkaline Phosphatase: 159 IU/L — ABNORMAL HIGH (ref 44–121)
BUN/Creatinine Ratio: 7 — ABNORMAL LOW (ref 9–20)
BUN: 5 mg/dL — ABNORMAL LOW (ref 6–20)
Bilirubin Total: 0.2 mg/dL (ref 0.0–1.2)
CO2: 29 mmol/L (ref 20–29)
Calcium: 9.5 mg/dL (ref 8.7–10.2)
Chloride: 92 mmol/L — ABNORMAL LOW (ref 96–106)
Creatinine, Ser: 0.74 mg/dL — ABNORMAL LOW (ref 0.76–1.27)
Globulin, Total: 1.9 g/dL (ref 1.5–4.5)
Glucose: 75 mg/dL (ref 70–99)
Potassium: 3.7 mmol/L (ref 3.5–5.2)
Sodium: 135 mmol/L (ref 134–144)
Total Protein: 5.7 g/dL — ABNORMAL LOW (ref 6.0–8.5)
eGFR: 128 mL/min/{1.73_m2} (ref >59)

## 2022-03-18 LAB — CBC WITH DIFFERENTIAL/PLATELET
Basophils Absolute: 0.1 10*3/uL (ref 0.0–0.2)
Basos: 1 %
EOS (ABSOLUTE): 0.4 10*3/uL (ref 0.0–0.4)
Eos: 4 %
Hematocrit: 42.1 % (ref 37.5–51.0)
Hemoglobin: 14.4 g/dL (ref 13.0–17.7)
Immature Grans (Abs): 0.1 10*3/uL (ref 0.0–0.1)
Immature Granulocytes: 1 %
Lymphocytes Absolute: 3.9 10*3/uL — ABNORMAL HIGH (ref 0.7–3.1)
Lymphs: 36 %
MCH: 32.1 pg (ref 26.6–33.0)
MCHC: 34.2 g/dL (ref 31.5–35.7)
MCV: 94 fL (ref 79–97)
Monocytes Absolute: 1 10*3/uL — ABNORMAL HIGH (ref 0.1–0.9)
Monocytes: 9 %
Neutrophils Absolute: 5.6 10*3/uL (ref 1.4–7.0)
Neutrophils: 49 %
Platelets: 395 10*3/uL (ref 150–450)
RBC: 4.49 x10E6/uL (ref 4.14–5.80)
RDW: 12 % (ref 11.6–15.4)
WBC: 11 10*3/uL — ABNORMAL HIGH (ref 3.4–10.8)

## 2022-03-28 ENCOUNTER — Telehealth: Payer: Medicare Other

## 2022-03-30 DIAGNOSIS — Z9181 History of falling: Secondary | ICD-10-CM | POA: Diagnosis not present

## 2022-03-30 DIAGNOSIS — Z923 Personal history of irradiation: Secondary | ICD-10-CM | POA: Diagnosis not present

## 2022-03-30 DIAGNOSIS — E291 Testicular hypofunction: Secondary | ICD-10-CM | POA: Diagnosis not present

## 2022-03-30 DIAGNOSIS — Z79899 Other long term (current) drug therapy: Secondary | ICD-10-CM | POA: Diagnosis not present

## 2022-03-30 DIAGNOSIS — Z7952 Long term (current) use of systemic steroids: Secondary | ICD-10-CM | POA: Diagnosis not present

## 2022-03-30 DIAGNOSIS — Z5181 Encounter for therapeutic drug level monitoring: Secondary | ICD-10-CM | POA: Diagnosis not present

## 2022-03-30 DIAGNOSIS — G40219 Localization-related (focal) (partial) symptomatic epilepsy and epileptic syndromes with complex partial seizures, intractable, without status epilepticus: Secondary | ICD-10-CM | POA: Diagnosis not present

## 2022-03-30 DIAGNOSIS — E2740 Unspecified adrenocortical insufficiency: Secondary | ICD-10-CM | POA: Diagnosis not present

## 2022-03-30 DIAGNOSIS — T50905A Adverse effect of unspecified drugs, medicaments and biological substances, initial encounter: Secondary | ICD-10-CM | POA: Diagnosis not present

## 2022-03-30 DIAGNOSIS — M818 Other osteoporosis without current pathological fracture: Secondary | ICD-10-CM | POA: Diagnosis not present

## 2022-03-30 DIAGNOSIS — Z9221 Personal history of antineoplastic chemotherapy: Secondary | ICD-10-CM | POA: Diagnosis not present

## 2022-04-02 ENCOUNTER — Other Ambulatory Visit: Payer: Self-pay | Admitting: Family Medicine

## 2022-04-02 DIAGNOSIS — M25462 Effusion, left knee: Secondary | ICD-10-CM

## 2022-04-04 ENCOUNTER — Other Ambulatory Visit: Payer: Self-pay | Admitting: Family

## 2022-04-04 ENCOUNTER — Other Ambulatory Visit: Payer: Self-pay | Admitting: Family Medicine

## 2022-04-04 DIAGNOSIS — R062 Wheezing: Secondary | ICD-10-CM

## 2022-04-04 DIAGNOSIS — B9689 Other specified bacterial agents as the cause of diseases classified elsewhere: Secondary | ICD-10-CM

## 2022-04-05 DIAGNOSIS — G40209 Localization-related (focal) (partial) symptomatic epilepsy and epileptic syndromes with complex partial seizures, not intractable, without status epilepticus: Secondary | ICD-10-CM | POA: Diagnosis not present

## 2022-04-05 DIAGNOSIS — Z9221 Personal history of antineoplastic chemotherapy: Secondary | ICD-10-CM | POA: Diagnosis not present

## 2022-04-05 DIAGNOSIS — G40219 Localization-related (focal) (partial) symptomatic epilepsy and epileptic syndromes with complex partial seizures, intractable, without status epilepticus: Secondary | ICD-10-CM | POA: Diagnosis not present

## 2022-04-05 DIAGNOSIS — C716 Malignant neoplasm of cerebellum: Secondary | ICD-10-CM | POA: Diagnosis not present

## 2022-04-05 DIAGNOSIS — G9340 Encephalopathy, unspecified: Secondary | ICD-10-CM | POA: Diagnosis not present

## 2022-04-05 DIAGNOSIS — Z923 Personal history of irradiation: Secondary | ICD-10-CM | POA: Diagnosis not present

## 2022-04-05 DIAGNOSIS — Z79899 Other long term (current) drug therapy: Secondary | ICD-10-CM | POA: Diagnosis not present

## 2022-04-05 DIAGNOSIS — G8929 Other chronic pain: Secondary | ICD-10-CM | POA: Diagnosis not present

## 2022-04-05 DIAGNOSIS — G4489 Other headache syndrome: Secondary | ICD-10-CM | POA: Diagnosis not present

## 2022-04-05 DIAGNOSIS — M25569 Pain in unspecified knee: Secondary | ICD-10-CM | POA: Diagnosis not present

## 2022-04-06 ENCOUNTER — Other Ambulatory Visit: Payer: Self-pay | Admitting: Family Medicine

## 2022-04-06 DIAGNOSIS — F419 Anxiety disorder, unspecified: Secondary | ICD-10-CM

## 2022-04-24 ENCOUNTER — Other Ambulatory Visit: Payer: Self-pay | Admitting: Family Medicine

## 2022-04-24 DIAGNOSIS — M25562 Pain in left knee: Secondary | ICD-10-CM

## 2022-04-24 DIAGNOSIS — F419 Anxiety disorder, unspecified: Secondary | ICD-10-CM

## 2022-05-05 DIAGNOSIS — Z85841 Personal history of malignant neoplasm of brain: Secondary | ICD-10-CM | POA: Diagnosis not present

## 2022-05-05 DIAGNOSIS — G9389 Other specified disorders of brain: Secondary | ICD-10-CM | POA: Diagnosis not present

## 2022-05-05 DIAGNOSIS — R4182 Altered mental status, unspecified: Secondary | ICD-10-CM | POA: Diagnosis not present

## 2022-05-05 DIAGNOSIS — R9401 Abnormal electroencephalogram [EEG]: Secondary | ICD-10-CM | POA: Diagnosis not present

## 2022-05-17 ENCOUNTER — Other Ambulatory Visit: Payer: Self-pay | Admitting: Family Medicine

## 2022-05-17 DIAGNOSIS — M25462 Effusion, left knee: Secondary | ICD-10-CM

## 2022-05-17 DIAGNOSIS — Z7409 Other reduced mobility: Secondary | ICD-10-CM

## 2022-05-26 ENCOUNTER — Ambulatory Visit: Payer: Self-pay | Admitting: *Deleted

## 2022-05-26 DIAGNOSIS — F322 Major depressive disorder, single episode, severe without psychotic features: Secondary | ICD-10-CM

## 2022-05-26 DIAGNOSIS — H5410 Blindness, one eye, low vision other eye, unspecified eyes: Secondary | ICD-10-CM

## 2022-05-26 DIAGNOSIS — C716 Malignant neoplasm of cerebellum: Secondary | ICD-10-CM

## 2022-05-26 NOTE — Patient Instructions (Signed)
James Hoffman  I have previously worked with you through the Chronic Care Management Program at [facility]. Due to program changes I am removing myself from your care team because you've either met our goals, your conditions are stable and no longer require care management, or we haven't engaged within the past 6 months. If you are currently active with another CCM Team Member, you will remain active with them unless they reach out to you with additional information. If you feel that you need RN Care Management services in the future, please talk with your primary care provider to discuss re-engagement with the RN Care Manager that will be assigned to [facility]. This does not affect your status as a patient at [facility].   Thank you for allowing me to participate in your your healthcare journey.  Chong Sicilian, BSN, RN-BC Embedded Chronic Care Manager [facility] / [place] [facility] Direct Dial: [phone]

## 2022-05-26 NOTE — Chronic Care Management (AMB) (Signed)
  Chronic Care Management   Note  05/26/2022 Name: James Hoffman MRN: [id number] DOB: [birth year]   Patient has either met RN Care Management goals, is stable from [facility] Management perspective, or has not recently engaged with the RN Care Manager. I am removing RN Care Manager from Care Team and closing [place]. If patient is currently engaged with another CCM team member I will forward this encounter to inform them of my case closure. Patient may be eligible for re-engagement with RN Care Manager in the future if necessary and can discuss this with their PCP.  Chong Sicilian, BSN, RN-BC Embedded Chronic Care Manager [facility] / [place] [facility] Direct Dial: [phone]

## 2022-06-07 ENCOUNTER — Other Ambulatory Visit: Payer: Self-pay | Admitting: Family Medicine

## 2022-06-07 DIAGNOSIS — Z7409 Other reduced mobility: Secondary | ICD-10-CM

## 2022-06-07 DIAGNOSIS — M25562 Pain in left knee: Secondary | ICD-10-CM

## 2022-06-08 ENCOUNTER — Other Ambulatory Visit: Payer: Self-pay | Admitting: Family Medicine

## 2022-06-08 DIAGNOSIS — M25462 Effusion, left knee: Secondary | ICD-10-CM

## 2022-06-08 DIAGNOSIS — Z7409 Other reduced mobility: Secondary | ICD-10-CM

## 2022-06-08 NOTE — Telephone Encounter (Signed)
baclofen (LIORESAL) 20 MG tablet        Changed from: tiZANidine (ZANAFLEX) 4 MG tablet  Pharmacy comment: Alternative Requested:DRUG NOT ON FORMULARY - PREFER BACLOFEN - DO YOU WANT PT TO PAY OUT OF POCKET OR CHANGE TO PREFERRED?

## 2022-06-09 ENCOUNTER — Ambulatory Visit (INDEPENDENT_AMBULATORY_CARE_PROVIDER_SITE_OTHER): Payer: Medicare Other

## 2022-06-09 ENCOUNTER — Ambulatory Visit (INDEPENDENT_AMBULATORY_CARE_PROVIDER_SITE_OTHER): Payer: Medicare Other | Admitting: Family Medicine

## 2022-06-09 ENCOUNTER — Ambulatory Visit: Payer: Medicare Other | Admitting: Family Medicine

## 2022-06-09 ENCOUNTER — Other Ambulatory Visit: Payer: Self-pay | Admitting: Family Medicine

## 2022-06-09 ENCOUNTER — Encounter: Payer: Self-pay | Admitting: Family Medicine

## 2022-06-09 VITALS — BP 127/85 | HR 85 | Temp 97.5°F

## 2022-06-09 DIAGNOSIS — M79632 Pain in left forearm: Secondary | ICD-10-CM | POA: Diagnosis not present

## 2022-06-09 DIAGNOSIS — M25532 Pain in left wrist: Secondary | ICD-10-CM

## 2022-06-09 DIAGNOSIS — M79642 Pain in left hand: Secondary | ICD-10-CM | POA: Diagnosis not present

## 2022-06-09 NOTE — Telephone Encounter (Signed)
Please inform of change due to ins.

## 2022-06-09 NOTE — Progress Notes (Signed)
Assessment & Plan:  1-2. Left forearm pain/Left wrist pain X-rays negative. Patient placed in wrist splint and sling for comfort. Celebrex and Tylenol for pain relief.    Follow up plan: Return if symptoms worsen or fail to improve.  Hendricks Limes, MSN, APRN, FNP-C [facility]  Subjective:   Patient ID: James Hoffman, male    DOB: 03/05/1996, 26 y.o.   MRN: [id number]  HPI: James Hoffman is a 26 y.o. male presenting on 06/09/2022 for Hand Pain (Left hand and wrist pain that started this morning after taking a bath )  Patient is accompanied by his aunt. Patient is nonverbal. Elenor Legato reports his nanny was helping him in the bathtub this morning, after which he has been crying and holding his left arm. He has taken Celebrex this morning.     ROS: Negative unless specifically indicated above in HPI.   Relevant past medical history reviewed and updated as indicated.   Allergies and medications reviewed and updated.   Current Outpatient Medications:    atorvastatin (LIPITOR) 20 MG tablet, Take 20 mg by mouth daily., Disp: , Rfl:    baclofen (LIORESAL) 10 MG tablet, Take 1 tablet (10 mg total) by mouth 3 (three) times daily as needed for muscle spasms. To REPLACE tizanidine, Disp: 60 tablet, Rfl: 1   busPIRone (BUSPAR) 10 MG tablet, TAKE 1 TABLET (10 MG TOTAL) BY MOUTH 3 (THREE) TIMES DAILY. FOR ANXIETY, Disp: 270 tablet, Rfl: 0   celecoxib (CELEBREX) 50 MG capsule, Take 1 capsule (50 mg total) by mouth 2 (two) times daily as needed for pain., Disp: 180 capsule, Rfl: 1   cetirizine (ZYRTEC) 10 MG tablet, Take 1 tablet (10 mg total) by mouth daily., Disp: 30 tablet, Rfl: 11   Cholecalciferol 50 MCG (2000 UT) CAPS, Take by mouth., Disp: , Rfl:    clotrimazole-betamethasone (LOTRISONE) cream, APPLY 1 APPLICATION TOPICALLY 2 (TWO) TIMES DAILY. X 7-10 DAYS, Disp: 30 g, Rfl: 0   dapsone 100 MG tablet, Take 1 tablet (100 mg total) by mouth daily., Disp: 10 tablet,  Rfl: 0   escitalopram (LEXAPRO) 10 MG tablet, Take 1 tablet (10 mg total) by mouth daily., Disp: 90 tablet, Rfl: 3   FLOVENT HFA 44 MCG/ACT inhaler, INHALE 2 PUFFS INTO THE LUNGS TWICE A DAY, Disp: 10.6 each, Rfl: 11   fluticasone (FLONASE) 50 MCG/ACT nasal spray, SPRAY 2 SPRAYS INTO EACH NOSTRIL EVERY DAY, Disp: 48 mL, Rfl: 1   levETIRAcetam (KEPPRA) 750 MG tablet, Take 1,500 mg by mouth in the morning and at bedtime., Disp: , Rfl:    levothyroxine (SYNTHROID) 88 MCG tablet, Take 88 mcg by mouth daily before breakfast., Disp: , Rfl:    omeprazole (PRILOSEC) 20 MG capsule, TAKE 1 CAPSULE (20 MG TOTAL) BY MOUTH DAILY. AT LUNCH TIME., Disp: 90 capsule, Rfl: 3   potassium chloride SA (KLOR-CON M) 20 MEQ tablet, Take 1 tablet (20 mEq total) by mouth 2 (two) times daily., Disp: 30 tablet, Rfl: 2   prednisoLONE 5 MG TABS tablet, Take by mouth., Disp: , Rfl:    Spacer/Aero-Holding Chambers (AEROCHAMBER PLUS) inhaler, Use as instructed, Disp: 1 each, Rfl: 2   Testosterone 20.25 MG/ACT (1.62%) GEL, ONE PUM ON EACH SHOULDER EVERY MORNIG, Disp: 75 g, Rfl: 1   thiamine 100 MG tablet, Take 1 tablet (100 mg total) by mouth daily., Disp: 90 tablet, Rfl: 3   VENTOLIN HFA 108 (90 Base) MCG/ACT inhaler, TAKE 2 PUFFS BY MOUTH EVERY 6 HOURS  AS NEEDED FOR WHEEZE OR SHORTNESS OF BREATH, Disp: 18 each, Rfl: 2  Allergies  Allergen Reactions   Tramadol Anaphylaxis   Morphine Nausea And Vomiting    According to mother the patient is intolerant of morphine; causes nausea and vomiting.  Has had none since he was 26yo.    Objective:   BP 127/85   Pulse 85   Temp (!) 97.5 F (36.4 C) (Temporal)   SpO2 93%    Physical Exam Vitals reviewed.  Constitutional:      General: He is not in acute distress.    Appearance: Normal appearance. He is not ill-appearing, toxic-appearing or diaphoretic.  HENT:     Head: Normocephalic and atraumatic.  Eyes:     General: No scleral icterus.       Right eye: No discharge.         Left eye: No discharge.     Conjunctiva/sclera: Conjunctivae normal.  Cardiovascular:     Rate and Rhythm: Normal rate.  Pulmonary:     Effort: Pulmonary effort is normal. No respiratory distress.  Musculoskeletal:     Left elbow: Normal.     Left forearm: Tenderness present. No swelling, edema, deformity or lacerations.     Left wrist: Tenderness present. No swelling, deformity, effusion or lacerations. Decreased range of motion.     Cervical back: Normal range of motion.  Skin:    General: Skin is warm and dry.     Findings: Bruising (bilateral arms) present.  Neurological:     Mental Status: He is alert and oriented to person, place, and time. Mental status is at baseline.     Gait: Gait abnormal (not ambulatory, riding in wheelchair).  Psychiatric:        Mood and Affect: Mood normal.        Behavior: Behavior normal.        Thought Content: Thought content normal.        Judgment: Judgment normal.

## 2022-06-10 DIAGNOSIS — F17210 Nicotine dependence, cigarettes, uncomplicated: Secondary | ICD-10-CM | POA: Diagnosis present

## 2022-06-10 DIAGNOSIS — R509 Fever, unspecified: Secondary | ICD-10-CM | POA: Diagnosis not present

## 2022-06-10 DIAGNOSIS — G40911 Epilepsy, unspecified, intractable, with status epilepticus: Secondary | ICD-10-CM | POA: Diagnosis not present

## 2022-06-10 DIAGNOSIS — J14 Pneumonia due to Hemophilus influenzae: Secondary | ICD-10-CM | POA: Diagnosis not present

## 2022-06-10 DIAGNOSIS — G9389 Other specified disorders of brain: Secondary | ICD-10-CM | POA: Diagnosis not present

## 2022-06-10 DIAGNOSIS — Z79899 Other long term (current) drug therapy: Secondary | ICD-10-CM | POA: Diagnosis not present

## 2022-06-10 DIAGNOSIS — F32A Depression, unspecified: Secondary | ICD-10-CM | POA: Diagnosis present

## 2022-06-10 DIAGNOSIS — J154 Pneumonia due to other streptococci: Secondary | ICD-10-CM | POA: Diagnosis present

## 2022-06-10 DIAGNOSIS — T39395A Adverse effect of other nonsteroidal anti-inflammatory drugs [NSAID], initial encounter: Secondary | ICD-10-CM | POA: Diagnosis present

## 2022-06-10 DIAGNOSIS — R569 Unspecified convulsions: Secondary | ICD-10-CM | POA: Diagnosis not present

## 2022-06-10 DIAGNOSIS — R9401 Abnormal electroencephalogram [EEG]: Secondary | ICD-10-CM | POA: Diagnosis not present

## 2022-06-10 DIAGNOSIS — E2749 Other adrenocortical insufficiency: Secondary | ICD-10-CM | POA: Diagnosis not present

## 2022-06-10 DIAGNOSIS — R918 Other nonspecific abnormal finding of lung field: Secondary | ICD-10-CM | POA: Diagnosis not present

## 2022-06-10 DIAGNOSIS — G0481 Other encephalitis and encephalomyelitis: Secondary | ICD-10-CM | POA: Diagnosis present

## 2022-06-10 DIAGNOSIS — Z20822 Contact with and (suspected) exposure to covid-19: Secondary | ICD-10-CM | POA: Diagnosis present

## 2022-06-10 DIAGNOSIS — B9729 Other coronavirus as the cause of diseases classified elsewhere: Secondary | ICD-10-CM | POA: Diagnosis present

## 2022-06-10 DIAGNOSIS — G928 Other toxic encephalopathy: Secondary | ICD-10-CM | POA: Diagnosis present

## 2022-06-10 DIAGNOSIS — A938 Other specified arthropod-borne viral fevers: Secondary | ICD-10-CM | POA: Diagnosis not present

## 2022-06-10 DIAGNOSIS — G40909 Epilepsy, unspecified, not intractable, without status epilepticus: Secondary | ICD-10-CM | POA: Diagnosis not present

## 2022-06-10 DIAGNOSIS — R4189 Other symptoms and signs involving cognitive functions and awareness: Secondary | ICD-10-CM | POA: Diagnosis not present

## 2022-06-10 DIAGNOSIS — E039 Hypothyroidism, unspecified: Secondary | ICD-10-CM | POA: Diagnosis present

## 2022-06-10 DIAGNOSIS — J9601 Acute respiratory failure with hypoxia: Secondary | ICD-10-CM | POA: Diagnosis not present

## 2022-06-10 DIAGNOSIS — K219 Gastro-esophageal reflux disease without esophagitis: Secondary | ICD-10-CM | POA: Diagnosis not present

## 2022-06-10 DIAGNOSIS — Z8619 Personal history of other infectious and parasitic diseases: Secondary | ICD-10-CM | POA: Diagnosis not present

## 2022-06-10 DIAGNOSIS — G0490 Encephalitis and encephalomyelitis, unspecified: Secondary | ICD-10-CM | POA: Diagnosis not present

## 2022-06-10 DIAGNOSIS — R1312 Dysphagia, oropharyngeal phase: Secondary | ICD-10-CM | POA: Diagnosis not present

## 2022-06-10 DIAGNOSIS — J9690 Respiratory failure, unspecified, unspecified whether with hypoxia or hypercapnia: Secondary | ICD-10-CM | POA: Diagnosis not present

## 2022-06-10 DIAGNOSIS — R6339 Other feeding difficulties: Secondary | ICD-10-CM | POA: Diagnosis not present

## 2022-06-10 DIAGNOSIS — Z7951 Long term (current) use of inhaled steroids: Secondary | ICD-10-CM | POA: Diagnosis not present

## 2022-06-10 DIAGNOSIS — A879 Viral meningitis, unspecified: Secondary | ICD-10-CM | POA: Diagnosis not present

## 2022-06-10 DIAGNOSIS — E785 Hyperlipidemia, unspecified: Secondary | ICD-10-CM | POA: Diagnosis present

## 2022-06-10 DIAGNOSIS — E871 Hypo-osmolality and hyponatremia: Secondary | ICD-10-CM | POA: Diagnosis not present

## 2022-06-10 DIAGNOSIS — Z7952 Long term (current) use of systemic steroids: Secondary | ICD-10-CM | POA: Diagnosis not present

## 2022-06-10 DIAGNOSIS — Z9911 Dependence on respirator [ventilator] status: Secondary | ICD-10-CM | POA: Diagnosis not present

## 2022-06-10 DIAGNOSIS — R4182 Altered mental status, unspecified: Secondary | ICD-10-CM | POA: Diagnosis not present

## 2022-06-10 DIAGNOSIS — Z4659 Encounter for fitting and adjustment of other gastrointestinal appliance and device: Secondary | ICD-10-CM | POA: Diagnosis not present

## 2022-06-10 DIAGNOSIS — I639 Cerebral infarction, unspecified: Secondary | ICD-10-CM | POA: Diagnosis not present

## 2022-06-10 DIAGNOSIS — Z049 Encounter for examination and observation for unspecified reason: Secondary | ICD-10-CM | POA: Diagnosis not present

## 2022-06-10 DIAGNOSIS — G9340 Encephalopathy, unspecified: Secondary | ICD-10-CM | POA: Diagnosis not present

## 2022-06-10 DIAGNOSIS — Z8669 Personal history of other diseases of the nervous system and sense organs: Secondary | ICD-10-CM | POA: Diagnosis not present

## 2022-06-10 DIAGNOSIS — Z982 Presence of cerebrospinal fluid drainage device: Secondary | ICD-10-CM | POA: Diagnosis not present

## 2022-06-10 DIAGNOSIS — E861 Hypovolemia: Secondary | ICD-10-CM | POA: Diagnosis present

## 2022-06-10 DIAGNOSIS — R1310 Dysphagia, unspecified: Secondary | ICD-10-CM | POA: Diagnosis not present

## 2022-06-10 DIAGNOSIS — E2740 Unspecified adrenocortical insufficiency: Secondary | ICD-10-CM | POA: Diagnosis present

## 2022-06-10 DIAGNOSIS — J9691 Respiratory failure, unspecified with hypoxia: Secondary | ICD-10-CM | POA: Diagnosis not present

## 2022-06-10 DIAGNOSIS — F419 Anxiety disorder, unspecified: Secondary | ICD-10-CM | POA: Diagnosis not present

## 2022-06-10 DIAGNOSIS — R531 Weakness: Secondary | ICD-10-CM | POA: Diagnosis not present

## 2022-06-10 DIAGNOSIS — J13 Pneumonia due to Streptococcus pneumoniae: Secondary | ICD-10-CM | POA: Diagnosis not present

## 2022-06-10 DIAGNOSIS — F329 Major depressive disorder, single episode, unspecified: Secondary | ICD-10-CM | POA: Diagnosis not present

## 2022-06-10 DIAGNOSIS — R000 Tachycardia, unspecified: Secondary | ICD-10-CM | POA: Diagnosis not present

## 2022-06-10 DIAGNOSIS — J1289 Other viral pneumonia: Secondary | ICD-10-CM | POA: Diagnosis not present

## 2022-06-10 DIAGNOSIS — R400 Somnolence: Secondary | ICD-10-CM | POA: Diagnosis not present

## 2022-06-10 DIAGNOSIS — A419 Sepsis, unspecified organism: Secondary | ICD-10-CM | POA: Diagnosis not present

## 2022-06-10 DIAGNOSIS — G40901 Epilepsy, unspecified, not intractable, with status epilepticus: Secondary | ICD-10-CM | POA: Diagnosis not present

## 2022-06-10 DIAGNOSIS — E876 Hypokalemia: Secondary | ICD-10-CM | POA: Diagnosis not present

## 2022-06-10 DIAGNOSIS — G009 Bacterial meningitis, unspecified: Secondary | ICD-10-CM | POA: Diagnosis not present

## 2022-06-10 DIAGNOSIS — Z85841 Personal history of malignant neoplasm of brain: Secondary | ICD-10-CM | POA: Diagnosis not present

## 2022-06-10 DIAGNOSIS — R197 Diarrhea, unspecified: Secondary | ICD-10-CM | POA: Diagnosis not present

## 2022-06-11 DIAGNOSIS — T39395A Adverse effect of other nonsteroidal anti-inflammatory drugs [NSAID], initial encounter: Secondary | ICD-10-CM | POA: Diagnosis present

## 2022-06-11 DIAGNOSIS — J13 Pneumonia due to Streptococcus pneumoniae: Secondary | ICD-10-CM | POA: Diagnosis not present

## 2022-06-11 DIAGNOSIS — R6339 Other feeding difficulties: Secondary | ICD-10-CM | POA: Diagnosis not present

## 2022-06-11 DIAGNOSIS — E861 Hypovolemia: Secondary | ICD-10-CM | POA: Diagnosis present

## 2022-06-11 DIAGNOSIS — Z8669 Personal history of other diseases of the nervous system and sense organs: Secondary | ICD-10-CM | POA: Diagnosis not present

## 2022-06-11 DIAGNOSIS — A938 Other specified arthropod-borne viral fevers: Secondary | ICD-10-CM | POA: Diagnosis not present

## 2022-06-11 DIAGNOSIS — B9729 Other coronavirus as the cause of diseases classified elsewhere: Secondary | ICD-10-CM | POA: Diagnosis present

## 2022-06-11 DIAGNOSIS — E039 Hypothyroidism, unspecified: Secondary | ICD-10-CM | POA: Diagnosis present

## 2022-06-11 DIAGNOSIS — J1289 Other viral pneumonia: Secondary | ICD-10-CM | POA: Diagnosis not present

## 2022-06-11 DIAGNOSIS — R918 Other nonspecific abnormal finding of lung field: Secondary | ICD-10-CM | POA: Diagnosis not present

## 2022-06-11 DIAGNOSIS — E2740 Unspecified adrenocortical insufficiency: Secondary | ICD-10-CM | POA: Diagnosis present

## 2022-06-11 DIAGNOSIS — E871 Hypo-osmolality and hyponatremia: Secondary | ICD-10-CM | POA: Diagnosis present

## 2022-06-11 DIAGNOSIS — Z7951 Long term (current) use of inhaled steroids: Secondary | ICD-10-CM | POA: Diagnosis not present

## 2022-06-11 DIAGNOSIS — J9601 Acute respiratory failure with hypoxia: Secondary | ICD-10-CM | POA: Diagnosis not present

## 2022-06-11 DIAGNOSIS — Z4659 Encounter for fitting and adjustment of other gastrointestinal appliance and device: Secondary | ICD-10-CM | POA: Diagnosis not present

## 2022-06-11 DIAGNOSIS — Z85841 Personal history of malignant neoplasm of brain: Secondary | ICD-10-CM | POA: Diagnosis not present

## 2022-06-11 DIAGNOSIS — Z79899 Other long term (current) drug therapy: Secondary | ICD-10-CM | POA: Diagnosis not present

## 2022-06-11 DIAGNOSIS — R197 Diarrhea, unspecified: Secondary | ICD-10-CM | POA: Diagnosis not present

## 2022-06-11 DIAGNOSIS — R1312 Dysphagia, oropharyngeal phase: Secondary | ICD-10-CM | POA: Diagnosis not present

## 2022-06-11 DIAGNOSIS — G9340 Encephalopathy, unspecified: Secondary | ICD-10-CM | POA: Diagnosis not present

## 2022-06-11 DIAGNOSIS — F329 Major depressive disorder, single episode, unspecified: Secondary | ICD-10-CM | POA: Diagnosis not present

## 2022-06-11 DIAGNOSIS — F419 Anxiety disorder, unspecified: Secondary | ICD-10-CM | POA: Diagnosis not present

## 2022-06-11 DIAGNOSIS — R9401 Abnormal electroencephalogram [EEG]: Secondary | ICD-10-CM | POA: Diagnosis not present

## 2022-06-11 DIAGNOSIS — Z982 Presence of cerebrospinal fluid drainage device: Secondary | ICD-10-CM | POA: Diagnosis not present

## 2022-06-11 DIAGNOSIS — R1310 Dysphagia, unspecified: Secondary | ICD-10-CM | POA: Diagnosis not present

## 2022-06-11 DIAGNOSIS — I639 Cerebral infarction, unspecified: Secondary | ICD-10-CM | POA: Diagnosis not present

## 2022-06-11 DIAGNOSIS — Z9911 Dependence on respirator [ventilator] status: Secondary | ICD-10-CM | POA: Diagnosis not present

## 2022-06-11 DIAGNOSIS — R509 Fever, unspecified: Secondary | ICD-10-CM | POA: Diagnosis not present

## 2022-06-11 DIAGNOSIS — R000 Tachycardia, unspecified: Secondary | ICD-10-CM | POA: Diagnosis not present

## 2022-06-11 DIAGNOSIS — R531 Weakness: Secondary | ICD-10-CM | POA: Diagnosis not present

## 2022-06-11 DIAGNOSIS — R569 Unspecified convulsions: Secondary | ICD-10-CM | POA: Diagnosis not present

## 2022-06-11 DIAGNOSIS — G0490 Encephalitis and encephalomyelitis, unspecified: Secondary | ICD-10-CM | POA: Diagnosis not present

## 2022-06-11 DIAGNOSIS — Z8619 Personal history of other infectious and parasitic diseases: Secondary | ICD-10-CM | POA: Diagnosis not present

## 2022-06-11 DIAGNOSIS — E2749 Other adrenocortical insufficiency: Secondary | ICD-10-CM | POA: Diagnosis present

## 2022-06-11 DIAGNOSIS — Z7952 Long term (current) use of systemic steroids: Secondary | ICD-10-CM | POA: Diagnosis not present

## 2022-06-11 DIAGNOSIS — Z20822 Contact with and (suspected) exposure to covid-19: Secondary | ICD-10-CM | POA: Diagnosis present

## 2022-06-11 DIAGNOSIS — F17210 Nicotine dependence, cigarettes, uncomplicated: Secondary | ICD-10-CM | POA: Diagnosis present

## 2022-06-11 DIAGNOSIS — G40909 Epilepsy, unspecified, not intractable, without status epilepticus: Secondary | ICD-10-CM | POA: Diagnosis not present

## 2022-06-11 DIAGNOSIS — G9389 Other specified disorders of brain: Secondary | ICD-10-CM | POA: Diagnosis not present

## 2022-06-11 DIAGNOSIS — A419 Sepsis, unspecified organism: Secondary | ICD-10-CM | POA: Diagnosis not present

## 2022-06-11 DIAGNOSIS — E876 Hypokalemia: Secondary | ICD-10-CM | POA: Diagnosis present

## 2022-06-11 DIAGNOSIS — A879 Viral meningitis, unspecified: Secondary | ICD-10-CM | POA: Diagnosis not present

## 2022-06-11 DIAGNOSIS — J14 Pneumonia due to Hemophilus influenzae: Secondary | ICD-10-CM | POA: Diagnosis not present

## 2022-06-11 DIAGNOSIS — G928 Other toxic encephalopathy: Secondary | ICD-10-CM | POA: Diagnosis present

## 2022-06-11 DIAGNOSIS — G0481 Other encephalitis and encephalomyelitis: Secondary | ICD-10-CM | POA: Diagnosis present

## 2022-06-11 DIAGNOSIS — J154 Pneumonia due to other streptococci: Secondary | ICD-10-CM | POA: Diagnosis present

## 2022-06-11 DIAGNOSIS — G009 Bacterial meningitis, unspecified: Secondary | ICD-10-CM | POA: Diagnosis not present

## 2022-06-11 DIAGNOSIS — E785 Hyperlipidemia, unspecified: Secondary | ICD-10-CM | POA: Diagnosis present

## 2022-06-11 DIAGNOSIS — J9691 Respiratory failure, unspecified with hypoxia: Secondary | ICD-10-CM | POA: Diagnosis not present

## 2022-06-11 DIAGNOSIS — K219 Gastro-esophageal reflux disease without esophagitis: Secondary | ICD-10-CM | POA: Diagnosis present

## 2022-06-11 DIAGNOSIS — F32A Depression, unspecified: Secondary | ICD-10-CM | POA: Diagnosis present

## 2022-06-11 DIAGNOSIS — R4189 Other symptoms and signs involving cognitive functions and awareness: Secondary | ICD-10-CM | POA: Diagnosis not present

## 2022-06-11 DIAGNOSIS — G40901 Epilepsy, unspecified, not intractable, with status epilepticus: Secondary | ICD-10-CM | POA: Diagnosis present

## 2022-06-12 DIAGNOSIS — R9401 Abnormal electroencephalogram [EEG]: Secondary | ICD-10-CM | POA: Diagnosis not present

## 2022-06-12 DIAGNOSIS — E039 Hypothyroidism, unspecified: Secondary | ICD-10-CM | POA: Diagnosis not present

## 2022-06-12 DIAGNOSIS — F329 Major depressive disorder, single episode, unspecified: Secondary | ICD-10-CM | POA: Diagnosis not present

## 2022-06-12 DIAGNOSIS — R569 Unspecified convulsions: Secondary | ICD-10-CM | POA: Diagnosis not present

## 2022-06-12 DIAGNOSIS — E871 Hypo-osmolality and hyponatremia: Secondary | ICD-10-CM | POA: Diagnosis not present

## 2022-06-12 DIAGNOSIS — G0490 Encephalitis and encephalomyelitis, unspecified: Secondary | ICD-10-CM | POA: Diagnosis not present

## 2022-06-12 DIAGNOSIS — G40901 Epilepsy, unspecified, not intractable, with status epilepticus: Secondary | ICD-10-CM | POA: Diagnosis not present

## 2022-06-12 DIAGNOSIS — G9340 Encephalopathy, unspecified: Secondary | ICD-10-CM | POA: Diagnosis not present

## 2022-06-12 DIAGNOSIS — K219 Gastro-esophageal reflux disease without esophagitis: Secondary | ICD-10-CM | POA: Diagnosis not present

## 2022-06-12 DIAGNOSIS — Z79899 Other long term (current) drug therapy: Secondary | ICD-10-CM | POA: Diagnosis not present

## 2022-06-12 DIAGNOSIS — E2740 Unspecified adrenocortical insufficiency: Secondary | ICD-10-CM | POA: Diagnosis not present

## 2022-06-12 DIAGNOSIS — Z982 Presence of cerebrospinal fluid drainage device: Secondary | ICD-10-CM | POA: Diagnosis not present

## 2022-06-12 DIAGNOSIS — R531 Weakness: Secondary | ICD-10-CM | POA: Diagnosis not present

## 2022-06-12 DIAGNOSIS — R4189 Other symptoms and signs involving cognitive functions and awareness: Secondary | ICD-10-CM | POA: Diagnosis not present

## 2022-06-12 DIAGNOSIS — R509 Fever, unspecified: Secondary | ICD-10-CM | POA: Diagnosis not present

## 2022-06-12 DIAGNOSIS — J9601 Acute respiratory failure with hypoxia: Secondary | ICD-10-CM | POA: Diagnosis not present

## 2022-06-12 DIAGNOSIS — Z85841 Personal history of malignant neoplasm of brain: Secondary | ICD-10-CM | POA: Diagnosis not present

## 2022-06-12 DIAGNOSIS — E785 Hyperlipidemia, unspecified: Secondary | ICD-10-CM | POA: Diagnosis not present

## 2022-06-12 DIAGNOSIS — Z8669 Personal history of other diseases of the nervous system and sense organs: Secondary | ICD-10-CM | POA: Diagnosis not present

## 2022-06-13 DIAGNOSIS — B9729 Other coronavirus as the cause of diseases classified elsewhere: Secondary | ICD-10-CM | POA: Diagnosis not present

## 2022-06-13 DIAGNOSIS — G0490 Encephalitis and encephalomyelitis, unspecified: Secondary | ICD-10-CM | POA: Diagnosis not present

## 2022-06-13 DIAGNOSIS — R569 Unspecified convulsions: Secondary | ICD-10-CM | POA: Diagnosis not present

## 2022-06-13 DIAGNOSIS — G40909 Epilepsy, unspecified, not intractable, without status epilepticus: Secondary | ICD-10-CM | POA: Diagnosis not present

## 2022-06-13 DIAGNOSIS — Z85841 Personal history of malignant neoplasm of brain: Secondary | ICD-10-CM | POA: Diagnosis not present

## 2022-06-13 DIAGNOSIS — G009 Bacterial meningitis, unspecified: Secondary | ICD-10-CM | POA: Diagnosis not present

## 2022-06-13 DIAGNOSIS — R531 Weakness: Secondary | ICD-10-CM | POA: Diagnosis not present

## 2022-06-13 DIAGNOSIS — E039 Hypothyroidism, unspecified: Secondary | ICD-10-CM | POA: Diagnosis not present

## 2022-06-13 DIAGNOSIS — G40901 Epilepsy, unspecified, not intractable, with status epilepticus: Secondary | ICD-10-CM | POA: Diagnosis not present

## 2022-06-13 DIAGNOSIS — E785 Hyperlipidemia, unspecified: Secondary | ICD-10-CM | POA: Diagnosis not present

## 2022-06-13 DIAGNOSIS — K219 Gastro-esophageal reflux disease without esophagitis: Secondary | ICD-10-CM | POA: Diagnosis not present

## 2022-06-13 DIAGNOSIS — J13 Pneumonia due to Streptococcus pneumoniae: Secondary | ICD-10-CM | POA: Diagnosis not present

## 2022-06-13 DIAGNOSIS — R509 Fever, unspecified: Secondary | ICD-10-CM | POA: Diagnosis not present

## 2022-06-13 DIAGNOSIS — F329 Major depressive disorder, single episode, unspecified: Secondary | ICD-10-CM | POA: Diagnosis not present

## 2022-06-13 DIAGNOSIS — J9601 Acute respiratory failure with hypoxia: Secondary | ICD-10-CM | POA: Diagnosis not present

## 2022-06-13 DIAGNOSIS — Z982 Presence of cerebrospinal fluid drainage device: Secondary | ICD-10-CM | POA: Diagnosis not present

## 2022-06-13 DIAGNOSIS — E871 Hypo-osmolality and hyponatremia: Secondary | ICD-10-CM | POA: Diagnosis not present

## 2022-06-13 DIAGNOSIS — J14 Pneumonia due to Hemophilus influenzae: Secondary | ICD-10-CM | POA: Diagnosis not present

## 2022-06-13 DIAGNOSIS — Z8669 Personal history of other diseases of the nervous system and sense organs: Secondary | ICD-10-CM | POA: Diagnosis not present

## 2022-06-13 DIAGNOSIS — E2740 Unspecified adrenocortical insufficiency: Secondary | ICD-10-CM | POA: Diagnosis not present

## 2022-06-13 DIAGNOSIS — R9401 Abnormal electroencephalogram [EEG]: Secondary | ICD-10-CM | POA: Diagnosis not present

## 2022-06-14 DIAGNOSIS — G9340 Encephalopathy, unspecified: Secondary | ICD-10-CM | POA: Diagnosis not present

## 2022-06-14 DIAGNOSIS — J14 Pneumonia due to Hemophilus influenzae: Secondary | ICD-10-CM | POA: Diagnosis not present

## 2022-06-14 DIAGNOSIS — G40901 Epilepsy, unspecified, not intractable, with status epilepticus: Secondary | ICD-10-CM | POA: Diagnosis not present

## 2022-06-14 DIAGNOSIS — J9601 Acute respiratory failure with hypoxia: Secondary | ICD-10-CM | POA: Diagnosis not present

## 2022-06-14 DIAGNOSIS — F329 Major depressive disorder, single episode, unspecified: Secondary | ICD-10-CM | POA: Diagnosis not present

## 2022-06-14 DIAGNOSIS — J13 Pneumonia due to Streptococcus pneumoniae: Secondary | ICD-10-CM | POA: Diagnosis not present

## 2022-06-14 DIAGNOSIS — G9389 Other specified disorders of brain: Secondary | ICD-10-CM | POA: Diagnosis not present

## 2022-06-14 DIAGNOSIS — E2740 Unspecified adrenocortical insufficiency: Secondary | ICD-10-CM | POA: Diagnosis not present

## 2022-06-14 DIAGNOSIS — J1289 Other viral pneumonia: Secondary | ICD-10-CM | POA: Diagnosis not present

## 2022-06-14 DIAGNOSIS — G0490 Encephalitis and encephalomyelitis, unspecified: Secondary | ICD-10-CM | POA: Diagnosis not present

## 2022-06-14 DIAGNOSIS — B9729 Other coronavirus as the cause of diseases classified elsewhere: Secondary | ICD-10-CM | POA: Diagnosis not present

## 2022-06-14 DIAGNOSIS — E871 Hypo-osmolality and hyponatremia: Secondary | ICD-10-CM | POA: Diagnosis not present

## 2022-06-14 DIAGNOSIS — E039 Hypothyroidism, unspecified: Secondary | ICD-10-CM | POA: Diagnosis not present

## 2022-06-14 DIAGNOSIS — E785 Hyperlipidemia, unspecified: Secondary | ICD-10-CM | POA: Diagnosis not present

## 2022-06-14 DIAGNOSIS — K219 Gastro-esophageal reflux disease without esophagitis: Secondary | ICD-10-CM | POA: Diagnosis not present

## 2022-06-15 DIAGNOSIS — B9729 Other coronavirus as the cause of diseases classified elsewhere: Secondary | ICD-10-CM | POA: Diagnosis not present

## 2022-06-15 DIAGNOSIS — J1289 Other viral pneumonia: Secondary | ICD-10-CM | POA: Diagnosis not present

## 2022-06-15 DIAGNOSIS — G0490 Encephalitis and encephalomyelitis, unspecified: Secondary | ICD-10-CM | POA: Diagnosis not present

## 2022-06-15 DIAGNOSIS — R509 Fever, unspecified: Secondary | ICD-10-CM | POA: Diagnosis not present

## 2022-06-15 DIAGNOSIS — G40901 Epilepsy, unspecified, not intractable, with status epilepticus: Secondary | ICD-10-CM | POA: Diagnosis not present

## 2022-06-15 DIAGNOSIS — E871 Hypo-osmolality and hyponatremia: Secondary | ICD-10-CM | POA: Diagnosis not present

## 2022-06-15 DIAGNOSIS — E039 Hypothyroidism, unspecified: Secondary | ICD-10-CM | POA: Diagnosis not present

## 2022-06-15 DIAGNOSIS — J9601 Acute respiratory failure with hypoxia: Secondary | ICD-10-CM | POA: Diagnosis not present

## 2022-06-15 DIAGNOSIS — Z9911 Dependence on respirator [ventilator] status: Secondary | ICD-10-CM | POA: Diagnosis not present

## 2022-06-15 DIAGNOSIS — J14 Pneumonia due to Hemophilus influenzae: Secondary | ICD-10-CM | POA: Diagnosis not present

## 2022-06-15 DIAGNOSIS — F329 Major depressive disorder, single episode, unspecified: Secondary | ICD-10-CM | POA: Diagnosis not present

## 2022-06-15 DIAGNOSIS — E785 Hyperlipidemia, unspecified: Secondary | ICD-10-CM | POA: Diagnosis not present

## 2022-06-15 DIAGNOSIS — E2749 Other adrenocortical insufficiency: Secondary | ICD-10-CM | POA: Diagnosis not present

## 2022-06-15 DIAGNOSIS — I639 Cerebral infarction, unspecified: Secondary | ICD-10-CM | POA: Diagnosis not present

## 2022-06-15 DIAGNOSIS — G9340 Encephalopathy, unspecified: Secondary | ICD-10-CM | POA: Diagnosis not present

## 2022-06-15 DIAGNOSIS — J154 Pneumonia due to other streptococci: Secondary | ICD-10-CM | POA: Diagnosis not present

## 2022-06-16 DIAGNOSIS — E039 Hypothyroidism, unspecified: Secondary | ICD-10-CM | POA: Diagnosis not present

## 2022-06-16 DIAGNOSIS — B9729 Other coronavirus as the cause of diseases classified elsewhere: Secondary | ICD-10-CM | POA: Diagnosis not present

## 2022-06-16 DIAGNOSIS — E871 Hypo-osmolality and hyponatremia: Secondary | ICD-10-CM | POA: Diagnosis not present

## 2022-06-16 DIAGNOSIS — J14 Pneumonia due to Hemophilus influenzae: Secondary | ICD-10-CM | POA: Diagnosis not present

## 2022-06-16 DIAGNOSIS — E785 Hyperlipidemia, unspecified: Secondary | ICD-10-CM | POA: Diagnosis not present

## 2022-06-16 DIAGNOSIS — R197 Diarrhea, unspecified: Secondary | ICD-10-CM | POA: Diagnosis not present

## 2022-06-16 DIAGNOSIS — J1289 Other viral pneumonia: Secondary | ICD-10-CM | POA: Diagnosis not present

## 2022-06-16 DIAGNOSIS — E2749 Other adrenocortical insufficiency: Secondary | ICD-10-CM | POA: Diagnosis not present

## 2022-06-16 DIAGNOSIS — G9340 Encephalopathy, unspecified: Secondary | ICD-10-CM | POA: Diagnosis not present

## 2022-06-16 DIAGNOSIS — G0490 Encephalitis and encephalomyelitis, unspecified: Secondary | ICD-10-CM | POA: Diagnosis not present

## 2022-06-16 DIAGNOSIS — G40901 Epilepsy, unspecified, not intractable, with status epilepticus: Secondary | ICD-10-CM | POA: Diagnosis not present

## 2022-06-16 DIAGNOSIS — J9691 Respiratory failure, unspecified with hypoxia: Secondary | ICD-10-CM | POA: Diagnosis not present

## 2022-06-16 DIAGNOSIS — J13 Pneumonia due to Streptococcus pneumoniae: Secondary | ICD-10-CM | POA: Diagnosis not present

## 2022-06-17 DIAGNOSIS — E785 Hyperlipidemia, unspecified: Secondary | ICD-10-CM | POA: Diagnosis not present

## 2022-06-17 DIAGNOSIS — E2749 Other adrenocortical insufficiency: Secondary | ICD-10-CM | POA: Diagnosis not present

## 2022-06-17 DIAGNOSIS — B9729 Other coronavirus as the cause of diseases classified elsewhere: Secondary | ICD-10-CM | POA: Diagnosis not present

## 2022-06-17 DIAGNOSIS — R197 Diarrhea, unspecified: Secondary | ICD-10-CM | POA: Diagnosis not present

## 2022-06-17 DIAGNOSIS — J1289 Other viral pneumonia: Secondary | ICD-10-CM | POA: Diagnosis not present

## 2022-06-17 DIAGNOSIS — G9340 Encephalopathy, unspecified: Secondary | ICD-10-CM | POA: Diagnosis not present

## 2022-06-17 DIAGNOSIS — E871 Hypo-osmolality and hyponatremia: Secondary | ICD-10-CM | POA: Diagnosis not present

## 2022-06-17 DIAGNOSIS — E039 Hypothyroidism, unspecified: Secondary | ICD-10-CM | POA: Diagnosis not present

## 2022-06-17 DIAGNOSIS — J9691 Respiratory failure, unspecified with hypoxia: Secondary | ICD-10-CM | POA: Diagnosis not present

## 2022-06-17 DIAGNOSIS — G40901 Epilepsy, unspecified, not intractable, with status epilepticus: Secondary | ICD-10-CM | POA: Diagnosis not present

## 2022-06-17 DIAGNOSIS — J14 Pneumonia due to Hemophilus influenzae: Secondary | ICD-10-CM | POA: Diagnosis not present

## 2022-06-17 DIAGNOSIS — J13 Pneumonia due to Streptococcus pneumoniae: Secondary | ICD-10-CM | POA: Diagnosis not present

## 2022-06-18 DIAGNOSIS — J13 Pneumonia due to Streptococcus pneumoniae: Secondary | ICD-10-CM | POA: Diagnosis not present

## 2022-06-18 DIAGNOSIS — J14 Pneumonia due to Hemophilus influenzae: Secondary | ICD-10-CM | POA: Diagnosis not present

## 2022-06-18 DIAGNOSIS — J9691 Respiratory failure, unspecified with hypoxia: Secondary | ICD-10-CM | POA: Diagnosis not present

## 2022-06-18 DIAGNOSIS — J1289 Other viral pneumonia: Secondary | ICD-10-CM | POA: Diagnosis not present

## 2022-06-18 DIAGNOSIS — A879 Viral meningitis, unspecified: Secondary | ICD-10-CM | POA: Diagnosis not present

## 2022-06-18 DIAGNOSIS — E2749 Other adrenocortical insufficiency: Secondary | ICD-10-CM | POA: Diagnosis not present

## 2022-06-18 DIAGNOSIS — G9340 Encephalopathy, unspecified: Secondary | ICD-10-CM | POA: Diagnosis not present

## 2022-06-18 DIAGNOSIS — B9729 Other coronavirus as the cause of diseases classified elsewhere: Secondary | ICD-10-CM | POA: Diagnosis not present

## 2022-06-18 DIAGNOSIS — G40901 Epilepsy, unspecified, not intractable, with status epilepticus: Secondary | ICD-10-CM | POA: Diagnosis not present

## 2022-06-18 DIAGNOSIS — E039 Hypothyroidism, unspecified: Secondary | ICD-10-CM | POA: Diagnosis not present

## 2022-06-18 DIAGNOSIS — R197 Diarrhea, unspecified: Secondary | ICD-10-CM | POA: Diagnosis not present

## 2022-06-18 DIAGNOSIS — E871 Hypo-osmolality and hyponatremia: Secondary | ICD-10-CM | POA: Diagnosis not present

## 2022-06-19 DIAGNOSIS — J1289 Other viral pneumonia: Secondary | ICD-10-CM | POA: Diagnosis not present

## 2022-06-19 DIAGNOSIS — J9601 Acute respiratory failure with hypoxia: Secondary | ICD-10-CM | POA: Diagnosis not present

## 2022-06-19 DIAGNOSIS — E2749 Other adrenocortical insufficiency: Secondary | ICD-10-CM | POA: Diagnosis not present

## 2022-06-19 DIAGNOSIS — J14 Pneumonia due to Hemophilus influenzae: Secondary | ICD-10-CM | POA: Diagnosis not present

## 2022-06-19 DIAGNOSIS — R1310 Dysphagia, unspecified: Secondary | ICD-10-CM | POA: Diagnosis not present

## 2022-06-19 DIAGNOSIS — A419 Sepsis, unspecified organism: Secondary | ICD-10-CM | POA: Diagnosis not present

## 2022-06-19 DIAGNOSIS — J154 Pneumonia due to other streptococci: Secondary | ICD-10-CM | POA: Diagnosis not present

## 2022-06-19 DIAGNOSIS — E871 Hypo-osmolality and hyponatremia: Secondary | ICD-10-CM | POA: Diagnosis not present

## 2022-06-19 DIAGNOSIS — K219 Gastro-esophageal reflux disease without esophagitis: Secondary | ICD-10-CM | POA: Diagnosis not present

## 2022-06-19 DIAGNOSIS — G40909 Epilepsy, unspecified, not intractable, without status epilepticus: Secondary | ICD-10-CM | POA: Diagnosis not present

## 2022-06-19 DIAGNOSIS — R531 Weakness: Secondary | ICD-10-CM | POA: Diagnosis not present

## 2022-06-19 DIAGNOSIS — B9729 Other coronavirus as the cause of diseases classified elsewhere: Secondary | ICD-10-CM | POA: Diagnosis not present

## 2022-06-20 DIAGNOSIS — E785 Hyperlipidemia, unspecified: Secondary | ICD-10-CM | POA: Diagnosis not present

## 2022-06-20 DIAGNOSIS — R1310 Dysphagia, unspecified: Secondary | ICD-10-CM | POA: Diagnosis not present

## 2022-06-20 DIAGNOSIS — F419 Anxiety disorder, unspecified: Secondary | ICD-10-CM | POA: Diagnosis not present

## 2022-06-20 DIAGNOSIS — Z85841 Personal history of malignant neoplasm of brain: Secondary | ICD-10-CM | POA: Diagnosis not present

## 2022-06-20 DIAGNOSIS — Z8669 Personal history of other diseases of the nervous system and sense organs: Secondary | ICD-10-CM | POA: Diagnosis not present

## 2022-06-20 DIAGNOSIS — Z8619 Personal history of other infectious and parasitic diseases: Secondary | ICD-10-CM | POA: Diagnosis not present

## 2022-06-20 DIAGNOSIS — E2749 Other adrenocortical insufficiency: Secondary | ICD-10-CM | POA: Diagnosis not present

## 2022-06-20 DIAGNOSIS — E039 Hypothyroidism, unspecified: Secondary | ICD-10-CM | POA: Diagnosis not present

## 2022-06-20 DIAGNOSIS — E876 Hypokalemia: Secondary | ICD-10-CM | POA: Diagnosis not present

## 2022-06-20 DIAGNOSIS — Z982 Presence of cerebrospinal fluid drainage device: Secondary | ICD-10-CM | POA: Diagnosis not present

## 2022-06-20 DIAGNOSIS — E871 Hypo-osmolality and hyponatremia: Secondary | ICD-10-CM | POA: Diagnosis not present

## 2022-06-21 DIAGNOSIS — E2749 Other adrenocortical insufficiency: Secondary | ICD-10-CM | POA: Diagnosis not present

## 2022-06-21 DIAGNOSIS — Z8619 Personal history of other infectious and parasitic diseases: Secondary | ICD-10-CM | POA: Diagnosis not present

## 2022-06-21 DIAGNOSIS — Z85841 Personal history of malignant neoplasm of brain: Secondary | ICD-10-CM | POA: Diagnosis not present

## 2022-06-21 DIAGNOSIS — E871 Hypo-osmolality and hyponatremia: Secondary | ICD-10-CM | POA: Diagnosis not present

## 2022-06-21 DIAGNOSIS — Z982 Presence of cerebrospinal fluid drainage device: Secondary | ICD-10-CM | POA: Diagnosis not present

## 2022-06-21 DIAGNOSIS — R1312 Dysphagia, oropharyngeal phase: Secondary | ICD-10-CM | POA: Diagnosis not present

## 2022-06-21 DIAGNOSIS — R6339 Other feeding difficulties: Secondary | ICD-10-CM | POA: Diagnosis not present

## 2022-06-21 DIAGNOSIS — Z8669 Personal history of other diseases of the nervous system and sense organs: Secondary | ICD-10-CM | POA: Diagnosis not present

## 2022-06-21 DIAGNOSIS — R1310 Dysphagia, unspecified: Secondary | ICD-10-CM | POA: Diagnosis not present

## 2022-06-21 DIAGNOSIS — R569 Unspecified convulsions: Secondary | ICD-10-CM | POA: Diagnosis not present

## 2022-06-23 ENCOUNTER — Telehealth: Payer: Self-pay | Admitting: Family Medicine

## 2022-06-26 DIAGNOSIS — Z8673 Personal history of transient ischemic attack (TIA), and cerebral infarction without residual deficits: Secondary | ICD-10-CM | POA: Diagnosis not present

## 2022-06-26 DIAGNOSIS — F0280 Dementia in other diseases classified elsewhere without behavioral disturbance: Secondary | ICD-10-CM | POA: Diagnosis not present

## 2022-06-26 DIAGNOSIS — E038 Other specified hypothyroidism: Secondary | ICD-10-CM | POA: Diagnosis not present

## 2022-06-26 DIAGNOSIS — M1612 Unilateral primary osteoarthritis, left hip: Secondary | ICD-10-CM | POA: Diagnosis not present

## 2022-06-26 DIAGNOSIS — E291 Testicular hypofunction: Secondary | ICD-10-CM | POA: Diagnosis not present

## 2022-06-26 DIAGNOSIS — E2749 Other adrenocortical insufficiency: Secondary | ICD-10-CM | POA: Diagnosis not present

## 2022-06-26 DIAGNOSIS — C716 Malignant neoplasm of cerebellum: Secondary | ICD-10-CM | POA: Diagnosis not present

## 2022-06-26 DIAGNOSIS — N189 Chronic kidney disease, unspecified: Secondary | ICD-10-CM | POA: Diagnosis not present

## 2022-06-26 DIAGNOSIS — Z8701 Personal history of pneumonia (recurrent): Secondary | ICD-10-CM | POA: Diagnosis not present

## 2022-06-26 DIAGNOSIS — E871 Hypo-osmolality and hyponatremia: Secondary | ICD-10-CM | POA: Diagnosis not present

## 2022-06-26 DIAGNOSIS — F79 Unspecified intellectual disabilities: Secondary | ICD-10-CM | POA: Diagnosis not present

## 2022-06-26 DIAGNOSIS — Z7952 Long term (current) use of systemic steroids: Secondary | ICD-10-CM | POA: Diagnosis not present

## 2022-06-26 DIAGNOSIS — D631 Anemia in chronic kidney disease: Secondary | ICD-10-CM | POA: Diagnosis not present

## 2022-06-26 DIAGNOSIS — F17210 Nicotine dependence, cigarettes, uncomplicated: Secondary | ICD-10-CM | POA: Diagnosis not present

## 2022-06-26 DIAGNOSIS — D630 Anemia in neoplastic disease: Secondary | ICD-10-CM | POA: Diagnosis not present

## 2022-06-26 DIAGNOSIS — M222X9 Patellofemoral disorders, unspecified knee: Secondary | ICD-10-CM | POA: Diagnosis not present

## 2022-06-26 DIAGNOSIS — R1310 Dysphagia, unspecified: Secondary | ICD-10-CM | POA: Diagnosis not present

## 2022-06-26 DIAGNOSIS — G40219 Localization-related (focal) (partial) symptomatic epilepsy and epileptic syndromes with complex partial seizures, intractable, without status epilepticus: Secondary | ICD-10-CM | POA: Diagnosis not present

## 2022-06-26 DIAGNOSIS — J45990 Exercise induced bronchospasm: Secondary | ICD-10-CM | POA: Diagnosis not present

## 2022-06-26 DIAGNOSIS — M810 Age-related osteoporosis without current pathological fracture: Secondary | ICD-10-CM | POA: Diagnosis not present

## 2022-06-26 DIAGNOSIS — H903 Sensorineural hearing loss, bilateral: Secondary | ICD-10-CM | POA: Diagnosis not present

## 2022-06-27 ENCOUNTER — Telehealth: Payer: Self-pay | Admitting: *Deleted

## 2022-06-27 NOTE — Telephone Encounter (Signed)
I do not have Aricept on file for him. Can you verify that he is actually taking this?

## 2022-06-27 NOTE — Telephone Encounter (Signed)
VO given for nursing & adding OT & ST  FYI: interaction between Aricept & Lexapro - QT prolonging agent, but believes pt has been on for quite sometime, protocol they have to report.

## 2022-06-28 DIAGNOSIS — D630 Anemia in neoplastic disease: Secondary | ICD-10-CM | POA: Diagnosis not present

## 2022-06-28 DIAGNOSIS — D631 Anemia in chronic kidney disease: Secondary | ICD-10-CM | POA: Diagnosis not present

## 2022-06-28 DIAGNOSIS — G40219 Localization-related (focal) (partial) symptomatic epilepsy and epileptic syndromes with complex partial seizures, intractable, without status epilepticus: Secondary | ICD-10-CM | POA: Diagnosis not present

## 2022-06-28 DIAGNOSIS — E038 Other specified hypothyroidism: Secondary | ICD-10-CM | POA: Diagnosis not present

## 2022-06-28 DIAGNOSIS — C716 Malignant neoplasm of cerebellum: Secondary | ICD-10-CM | POA: Diagnosis not present

## 2022-06-28 DIAGNOSIS — N189 Chronic kidney disease, unspecified: Secondary | ICD-10-CM | POA: Diagnosis not present

## 2022-06-28 NOTE — Telephone Encounter (Signed)
Okay perfect can you please let the nurse that called know that he should not be on the Aricept

## 2022-06-29 DIAGNOSIS — C716 Malignant neoplasm of cerebellum: Secondary | ICD-10-CM | POA: Diagnosis not present

## 2022-06-29 DIAGNOSIS — D630 Anemia in neoplastic disease: Secondary | ICD-10-CM | POA: Diagnosis not present

## 2022-06-29 DIAGNOSIS — G40219 Localization-related (focal) (partial) symptomatic epilepsy and epileptic syndromes with complex partial seizures, intractable, without status epilepticus: Secondary | ICD-10-CM | POA: Diagnosis not present

## 2022-06-29 DIAGNOSIS — N189 Chronic kidney disease, unspecified: Secondary | ICD-10-CM | POA: Diagnosis not present

## 2022-06-29 DIAGNOSIS — D631 Anemia in chronic kidney disease: Secondary | ICD-10-CM | POA: Diagnosis not present

## 2022-06-29 DIAGNOSIS — E038 Other specified hypothyroidism: Secondary | ICD-10-CM | POA: Diagnosis not present

## 2022-07-03 ENCOUNTER — Telehealth: Payer: Self-pay | Admitting: Family Medicine

## 2022-07-03 DIAGNOSIS — E038 Other specified hypothyroidism: Secondary | ICD-10-CM | POA: Diagnosis not present

## 2022-07-03 DIAGNOSIS — G40219 Localization-related (focal) (partial) symptomatic epilepsy and epileptic syndromes with complex partial seizures, intractable, without status epilepticus: Secondary | ICD-10-CM | POA: Diagnosis not present

## 2022-07-03 DIAGNOSIS — D631 Anemia in chronic kidney disease: Secondary | ICD-10-CM | POA: Diagnosis not present

## 2022-07-03 DIAGNOSIS — D630 Anemia in neoplastic disease: Secondary | ICD-10-CM | POA: Diagnosis not present

## 2022-07-03 DIAGNOSIS — C716 Malignant neoplasm of cerebellum: Secondary | ICD-10-CM | POA: Diagnosis not present

## 2022-07-03 DIAGNOSIS — N189 Chronic kidney disease, unspecified: Secondary | ICD-10-CM | POA: Diagnosis not present

## 2022-07-06 DIAGNOSIS — C716 Malignant neoplasm of cerebellum: Secondary | ICD-10-CM | POA: Diagnosis not present

## 2022-07-06 DIAGNOSIS — E038 Other specified hypothyroidism: Secondary | ICD-10-CM | POA: Diagnosis not present

## 2022-07-06 DIAGNOSIS — D630 Anemia in neoplastic disease: Secondary | ICD-10-CM | POA: Diagnosis not present

## 2022-07-06 DIAGNOSIS — D631 Anemia in chronic kidney disease: Secondary | ICD-10-CM | POA: Diagnosis not present

## 2022-07-06 DIAGNOSIS — N189 Chronic kidney disease, unspecified: Secondary | ICD-10-CM | POA: Diagnosis not present

## 2022-07-06 DIAGNOSIS — G40219 Localization-related (focal) (partial) symptomatic epilepsy and epileptic syndromes with complex partial seizures, intractable, without status epilepticus: Secondary | ICD-10-CM | POA: Diagnosis not present

## 2022-07-07 DIAGNOSIS — G40219 Localization-related (focal) (partial) symptomatic epilepsy and epileptic syndromes with complex partial seizures, intractable, without status epilepticus: Secondary | ICD-10-CM | POA: Diagnosis not present

## 2022-07-07 DIAGNOSIS — D630 Anemia in neoplastic disease: Secondary | ICD-10-CM | POA: Diagnosis not present

## 2022-07-07 DIAGNOSIS — N189 Chronic kidney disease, unspecified: Secondary | ICD-10-CM | POA: Diagnosis not present

## 2022-07-07 DIAGNOSIS — D631 Anemia in chronic kidney disease: Secondary | ICD-10-CM | POA: Diagnosis not present

## 2022-07-07 DIAGNOSIS — C716 Malignant neoplasm of cerebellum: Secondary | ICD-10-CM | POA: Diagnosis not present

## 2022-07-07 DIAGNOSIS — E038 Other specified hypothyroidism: Secondary | ICD-10-CM | POA: Diagnosis not present

## 2022-07-11 DIAGNOSIS — E038 Other specified hypothyroidism: Secondary | ICD-10-CM | POA: Diagnosis not present

## 2022-07-11 DIAGNOSIS — N189 Chronic kidney disease, unspecified: Secondary | ICD-10-CM | POA: Diagnosis not present

## 2022-07-11 DIAGNOSIS — D630 Anemia in neoplastic disease: Secondary | ICD-10-CM | POA: Diagnosis not present

## 2022-07-11 DIAGNOSIS — G40219 Localization-related (focal) (partial) symptomatic epilepsy and epileptic syndromes with complex partial seizures, intractable, without status epilepticus: Secondary | ICD-10-CM | POA: Diagnosis not present

## 2022-07-11 DIAGNOSIS — C716 Malignant neoplasm of cerebellum: Secondary | ICD-10-CM | POA: Diagnosis not present

## 2022-07-11 DIAGNOSIS — D631 Anemia in chronic kidney disease: Secondary | ICD-10-CM | POA: Diagnosis not present

## 2022-07-13 DIAGNOSIS — D631 Anemia in chronic kidney disease: Secondary | ICD-10-CM | POA: Diagnosis not present

## 2022-07-13 DIAGNOSIS — N189 Chronic kidney disease, unspecified: Secondary | ICD-10-CM | POA: Diagnosis not present

## 2022-07-13 DIAGNOSIS — D630 Anemia in neoplastic disease: Secondary | ICD-10-CM | POA: Diagnosis not present

## 2022-07-13 DIAGNOSIS — C716 Malignant neoplasm of cerebellum: Secondary | ICD-10-CM | POA: Diagnosis not present

## 2022-07-13 DIAGNOSIS — E038 Other specified hypothyroidism: Secondary | ICD-10-CM | POA: Diagnosis not present

## 2022-07-13 DIAGNOSIS — G40219 Localization-related (focal) (partial) symptomatic epilepsy and epileptic syndromes with complex partial seizures, intractable, without status epilepticus: Secondary | ICD-10-CM | POA: Diagnosis not present

## 2022-07-14 ENCOUNTER — Ambulatory Visit (INDEPENDENT_AMBULATORY_CARE_PROVIDER_SITE_OTHER): Payer: Medicare Other

## 2022-07-14 DIAGNOSIS — H903 Sensorineural hearing loss, bilateral: Secondary | ICD-10-CM | POA: Diagnosis not present

## 2022-07-14 DIAGNOSIS — N189 Chronic kidney disease, unspecified: Secondary | ICD-10-CM

## 2022-07-14 DIAGNOSIS — D631 Anemia in chronic kidney disease: Secondary | ICD-10-CM | POA: Diagnosis not present

## 2022-07-14 DIAGNOSIS — C716 Malignant neoplasm of cerebellum: Secondary | ICD-10-CM | POA: Diagnosis not present

## 2022-07-14 DIAGNOSIS — F0280 Dementia in other diseases classified elsewhere without behavioral disturbance: Secondary | ICD-10-CM

## 2022-07-14 DIAGNOSIS — E871 Hypo-osmolality and hyponatremia: Secondary | ICD-10-CM | POA: Diagnosis not present

## 2022-07-14 DIAGNOSIS — R1310 Dysphagia, unspecified: Secondary | ICD-10-CM

## 2022-07-14 DIAGNOSIS — M810 Age-related osteoporosis without current pathological fracture: Secondary | ICD-10-CM

## 2022-07-14 DIAGNOSIS — E038 Other specified hypothyroidism: Secondary | ICD-10-CM

## 2022-07-14 DIAGNOSIS — G40219 Localization-related (focal) (partial) symptomatic epilepsy and epileptic syndromes with complex partial seizures, intractable, without status epilepticus: Secondary | ICD-10-CM

## 2022-07-14 DIAGNOSIS — E291 Testicular hypofunction: Secondary | ICD-10-CM

## 2022-07-14 DIAGNOSIS — D630 Anemia in neoplastic disease: Secondary | ICD-10-CM

## 2022-07-14 DIAGNOSIS — M1612 Unilateral primary osteoarthritis, left hip: Secondary | ICD-10-CM | POA: Diagnosis not present

## 2022-07-14 DIAGNOSIS — F79 Unspecified intellectual disabilities: Secondary | ICD-10-CM

## 2022-07-14 DIAGNOSIS — M222X9 Patellofemoral disorders, unspecified knee: Secondary | ICD-10-CM

## 2022-07-14 DIAGNOSIS — J45990 Exercise induced bronchospasm: Secondary | ICD-10-CM

## 2022-07-18 ENCOUNTER — Ambulatory Visit (INDEPENDENT_AMBULATORY_CARE_PROVIDER_SITE_OTHER): Payer: Medicare Other | Admitting: Family Medicine

## 2022-07-18 ENCOUNTER — Encounter: Payer: Self-pay | Admitting: Family Medicine

## 2022-07-18 VITALS — BP 117/76 | HR 93 | Temp 97.6°F | Ht 68.0 in | Wt 123.0 lb

## 2022-07-18 DIAGNOSIS — E890 Postprocedural hypothyroidism: Secondary | ICD-10-CM

## 2022-07-18 DIAGNOSIS — Z7409 Other reduced mobility: Secondary | ICD-10-CM | POA: Diagnosis not present

## 2022-07-18 DIAGNOSIS — G40909 Epilepsy, unspecified, not intractable, without status epilepticus: Secondary | ICD-10-CM | POA: Diagnosis not present

## 2022-07-18 DIAGNOSIS — Z8701 Personal history of pneumonia (recurrent): Secondary | ICD-10-CM | POA: Diagnosis not present

## 2022-07-18 DIAGNOSIS — Z09 Encounter for follow-up examination after completed treatment for conditions other than malignant neoplasm: Secondary | ICD-10-CM

## 2022-07-18 DIAGNOSIS — Z982 Presence of cerebrospinal fluid drainage device: Secondary | ICD-10-CM

## 2022-07-18 DIAGNOSIS — E2740 Unspecified adrenocortical insufficiency: Secondary | ICD-10-CM

## 2022-07-18 DIAGNOSIS — R32 Unspecified urinary incontinence: Secondary | ICD-10-CM

## 2022-07-18 NOTE — Progress Notes (Addendum)
Subjective: CC: hospital follow up PCP: [name], DO ZDG:LOVFIEP James Hoffman is a 26 y.o. male presenting to clinic today for:  1. AMS/ Seizure/ Aspiration pna Hospitalized for 12 days for above.  Treated with IV abx.  Receiving [facility] PT/ SLP.  Is supposed to have a swallow study but this has not yet been arranged.  He is currently being handfed but PO intake still limited.  Jacquelynn Cree is currently holding Prostat but aunt thinks it needs to be put back into the diet.  Needing new transport chair, gait belt (as he has been much weaker since coming home) and shower chair as he is currently being cleaned with washcloth only.  No SOB since coming home.  Continues to have hallucinations but to a lesser degree than when hospitalized.  Has not seen NEURO.  All appts have been put on hold for now due to decline in status.     ROS: Per HPI  Allergies  Allergen Reactions   Tramadol Anaphylaxis   Morphine Nausea And Vomiting    According to mother the patient is intolerant of morphine; causes nausea and vomiting.  Has had none since he was 26yo.   Past Medical History:  Diagnosis Date   Adrenal insufficiency ([place])    Cancer ([place])    brain tumor on brain stem   Hydrocephalus ([place])    Osteoporosis    Thyroid disease     Current Outpatient Medications:    atorvastatin (LIPITOR) 20 MG tablet, Take 20 mg by mouth daily., Disp: , Rfl:    baclofen (LIORESAL) 10 MG tablet, Take 1 tablet (10 mg total) by mouth 3 (three) times daily as needed for muscle spasms. To REPLACE tizanidine, Disp: 60 tablet, Rfl: 1   busPIRone (BUSPAR) 10 MG tablet, TAKE 1 TABLET (10 MG TOTAL) BY MOUTH 3 (THREE) TIMES DAILY. FOR ANXIETY, Disp: 270 tablet, Rfl: 0   celecoxib (CELEBREX) 50 MG capsule, Take 1 capsule (50 mg total) by mouth 2 (two) times daily as needed for pain., Disp: 180 capsule, Rfl: 1   cetirizine (ZYRTEC) 10 MG tablet, Take 1 tablet (10 mg total) by mouth daily., Disp: 30 tablet, Rfl: 11    Cholecalciferol 50 MCG (2000 UT) CAPS, Take by mouth., Disp: , Rfl:    clotrimazole-betamethasone (LOTRISONE) cream, APPLY 1 APPLICATION TOPICALLY 2 (TWO) TIMES DAILY. X 7-10 DAYS, Disp: 30 g, Rfl: 0   dapsone 100 MG tablet, Take 1 tablet (100 mg total) by mouth daily., Disp: 10 tablet, Rfl: 0   escitalopram (LEXAPRO) 10 MG tablet, Take 1 tablet (10 mg total) by mouth daily., Disp: 90 tablet, Rfl: 3   FLOVENT HFA 44 MCG/ACT inhaler, INHALE 2 PUFFS INTO THE LUNGS TWICE A DAY, Disp: 10.6 each, Rfl: 11   fluticasone (FLONASE) 50 MCG/ACT nasal spray, SPRAY 2 SPRAYS INTO EACH NOSTRIL EVERY DAY, Disp: 48 mL, Rfl: 1   levETIRAcetam (KEPPRA) 750 MG tablet, Take 1,500 mg by mouth in the morning and at bedtime., Disp: , Rfl:    levothyroxine (SYNTHROID) 88 MCG tablet, Take 88 mcg by mouth daily before breakfast., Disp: , Rfl:    omeprazole (PRILOSEC) 20 MG capsule, TAKE 1 CAPSULE (20 MG TOTAL) BY MOUTH DAILY. AT LUNCH TIME., Disp: 90 capsule, Rfl: 3   potassium chloride SA (KLOR-CON M) 20 MEQ tablet, Take 1 tablet (20 mEq total) by mouth 2 (two) times daily., Disp: 30 tablet, Rfl: 2   prednisoLONE 5 MG TABS tablet, Take by mouth., Disp: , Rfl:  Spacer/Aero-Holding Chambers (AEROCHAMBER PLUS) inhaler, Use as instructed, Disp: 1 each, Rfl: 2   Testosterone 20.25 MG/ACT (1.62%) GEL, ONE PUM ON EACH SHOULDER EVERY MORNIG, Disp: 75 g, Rfl: 1   thiamine 100 MG tablet, Take 1 tablet (100 mg total) by mouth daily., Disp: 90 tablet, Rfl: 3   VENTOLIN HFA 108 (90 Base) MCG/ACT inhaler, TAKE 2 PUFFS BY MOUTH EVERY 6 HOURS AS NEEDED FOR WHEEZE OR SHORTNESS OF BREATH, Disp: 18 each, Rfl: 2 Social History   Socioeconomic History   Marital status: Single    Spouse name: Not on file   Number of children: 0   Years of education: 7th grade   Highest education level: 7th grade  Occupational History   Occupation: disabled  Tobacco Use   Smoking status: Every Day    Packs/day: 0.25    Years: 6.00    Total pack  years: 1.50    Types: Cigarettes    Start date: 11/25/2012   Smokeless tobacco: Never  Vaping Use   Vaping Use: Never used  Substance and Sexual Activity   Alcohol use: No   Drug use: No   Sexual activity: Not Currently  Other Topics Concern   Not on file  Social History Narrative   Lives with his grandmother   Mostly wheelchair bound   Sometimes he uses walker when knees aren't hurt   Mostly dependent on others for assistance - eats on his own, but can't prepare meals, has to have help in and out of shower, but bathes himself, etc.   Social Determinants of Health   Financial Resource Strain: Low Risk  (11/29/2021)   Overall Financial Resource Strain (CARDIA)    Difficulty of Paying Living Expenses: Not hard at all  Food Insecurity: No Food Insecurity (11/29/2021)   Hunger Vital Sign    Worried About Running Out of Food in the Last Year: Never true    Ran Out of Food in the Last Year: Never true  Transportation Needs: No Transportation Needs (11/29/2021)   PRAPARE - Hydrologist (Medical): No    Lack of Transportation (Non-Medical): No  Physical Activity: Insufficiently Active (11/29/2021)   Exercise Vital Sign    Days of Exercise per Week: 7 days    Minutes of Exercise per Session: 20 min  Stress: Stress Concern Present (12/27/2021)   [place]    Feeling of Stress : To some extent  Social Connections: Socially Isolated (11/29/2021)   Social Connection and Isolation Panel [NHANES]    Frequency of Communication with Friends and Family: More than three times a week    Frequency of Social Gatherings with Friends and Family: More than three times a week    Attends Religious Services: Never    Marine scientist or Organizations: No    Attends Archivist Meetings: Never    Marital Status: Never married  Intimate Partner Violence: Not At Risk (11/29/2021)   Humiliation,  Afraid, Rape, and Kick questionnaire    Fear of Current or Ex-Partner: No    Emotionally Abused: No    Physically Abused: No    Sexually Abused: No   No family history on file.  Objective: Office vital signs reviewed. BP 117/76   Pulse 93   Temp 97.6 F (36.4 C)   Ht '5\' 8"'$  (1.727 m)   Wt 123 lb (55.8 kg)   SpO2 97%   BMI 18.70 kg/m  Physical Examination:  General: Awake, alert, mal nourished, No acute distress HEENT:sclera white, MMM Cardio: regular rate and rhythm  Pulm:  normal work of breathing on room air MSK: arrives in wheelchair. Poor tone moves UE and LEs (with assistance) Neuro: no nystagmus or tremor appreciated.  Focus and concentration waxes and wanes.   Psych: does note appear to be responding to internal stimuli  Assessment/ Plan: 26 y.o. male   Adrenal insufficiency ([place]) - Plan: CBC with Differential, [facility] discharge follow-up - Plan: CBC with Differential, Basic Metabolic Panel  Seizure disorder ([place]) - Plan: For home use only DME standard manual wheelchair with seat cushion, Gait belt, Shower chair  Impaired functional mobility, balance, gait, and endurance - Plan: For home use only DME standard manual wheelchair with seat cushion, Gait belt, Shower chair  Postoperative hypothyroidism  S/P VP shunt - Plan: For home use only DME standard manual wheelchair with seat cushion, Gait belt, Shower chair  History of aspiration pneumonia  Urinary incontinence, unspecified type  Labs reordered for Na and CBC.  Ft4 and cortisol normal 5 weeks ago while hospitalized so not reordered today Continue HH PT/ SLP/ OT.  No plans for placement at this time. Shower chair, gait belt and wheelchair ordered for pt. Agree needs swallow study given decline and recent treatment for aspiration pna.  [name] will call and let me know if this order needs to be placed, as it is being worked on by [facility].  Will need arranged with [facility]. Caregiver to  arrange f/u with Neuro given recent AMS/ Seizures etc  Continues to require incontinence supplies as he is incontinent due to history of brain tumor.  No orders of the defined types were placed in this encounter.  No orders of the defined types were placed in this encounter.  [name], DO [place] [phone]

## 2022-07-18 NOTE — Patient Instructions (Addendum)
Free T4 and Cortisol normal in hospital.

## 2022-07-19 DIAGNOSIS — D631 Anemia in chronic kidney disease: Secondary | ICD-10-CM | POA: Diagnosis not present

## 2022-07-19 DIAGNOSIS — N189 Chronic kidney disease, unspecified: Secondary | ICD-10-CM | POA: Diagnosis not present

## 2022-07-19 DIAGNOSIS — C716 Malignant neoplasm of cerebellum: Secondary | ICD-10-CM | POA: Diagnosis not present

## 2022-07-19 DIAGNOSIS — E038 Other specified hypothyroidism: Secondary | ICD-10-CM | POA: Diagnosis not present

## 2022-07-19 DIAGNOSIS — G40219 Localization-related (focal) (partial) symptomatic epilepsy and epileptic syndromes with complex partial seizures, intractable, without status epilepticus: Secondary | ICD-10-CM | POA: Diagnosis not present

## 2022-07-19 DIAGNOSIS — D630 Anemia in neoplastic disease: Secondary | ICD-10-CM | POA: Diagnosis not present

## 2022-07-19 LAB — CBC WITH DIFFERENTIAL/PLATELET
Basophils Absolute: 0.1 10*3/uL (ref 0.0–0.2)
Basos: 0 %
EOS (ABSOLUTE): 0.1 10*3/uL (ref 0.0–0.4)
Eos: 1 %
Hematocrit: 37.2 % — ABNORMAL LOW (ref 37.5–51.0)
Hemoglobin: 12.9 g/dL — ABNORMAL LOW (ref 13.0–17.7)
Immature Grans (Abs): 0.1 10*3/uL (ref 0.0–0.1)
Immature Granulocytes: 1 %
Lymphocytes Absolute: 1.8 10*3/uL (ref 0.7–3.1)
Lymphs: 13 %
MCH: 31.9 pg (ref 26.6–33.0)
MCHC: 34.7 g/dL (ref 31.5–35.7)
MCV: 92 fL (ref 79–97)
Monocytes Absolute: 0.9 10*3/uL (ref 0.1–0.9)
Monocytes: 6 %
Neutrophils Absolute: 10.9 10*3/uL — ABNORMAL HIGH (ref 1.4–7.0)
Neutrophils: 79 %
Platelets: 365 10*3/uL (ref 150–450)
RBC: 4.04 x10E6/uL — ABNORMAL LOW (ref 4.14–5.80)
RDW: 12.4 % (ref 11.6–15.4)
WBC: 13.8 10*3/uL — ABNORMAL HIGH (ref 3.4–10.8)

## 2022-07-19 LAB — BASIC METABOLIC PANEL
BUN/Creatinine Ratio: 13 (ref 9–20)
BUN: 9 mg/dL (ref 6–20)
CO2: 24 mmol/L (ref 20–29)
Calcium: 9.3 mg/dL (ref 8.7–10.2)
Chloride: 95 mmol/L — ABNORMAL LOW (ref 96–106)
Creatinine, Ser: 0.68 mg/dL — ABNORMAL LOW (ref 0.76–1.27)
Glucose: 96 mg/dL (ref 70–99)
Potassium: 4.2 mmol/L (ref 3.5–5.2)
Sodium: 138 mmol/L (ref 134–144)
eGFR: 131 mL/min/{1.73_m2} (ref >59)

## 2022-07-19 NOTE — Progress Notes (Signed)
Done

## 2022-07-20 DIAGNOSIS — E038 Other specified hypothyroidism: Secondary | ICD-10-CM | POA: Diagnosis not present

## 2022-07-20 DIAGNOSIS — D631 Anemia in chronic kidney disease: Secondary | ICD-10-CM | POA: Diagnosis not present

## 2022-07-20 DIAGNOSIS — D630 Anemia in neoplastic disease: Secondary | ICD-10-CM | POA: Diagnosis not present

## 2022-07-20 DIAGNOSIS — N189 Chronic kidney disease, unspecified: Secondary | ICD-10-CM | POA: Diagnosis not present

## 2022-07-20 DIAGNOSIS — G40219 Localization-related (focal) (partial) symptomatic epilepsy and epileptic syndromes with complex partial seizures, intractable, without status epilepticus: Secondary | ICD-10-CM | POA: Diagnosis not present

## 2022-07-20 DIAGNOSIS — C716 Malignant neoplasm of cerebellum: Secondary | ICD-10-CM | POA: Diagnosis not present

## 2022-07-25 ENCOUNTER — Telehealth: Payer: Self-pay | Admitting: Family Medicine

## 2022-07-25 DIAGNOSIS — Z8701 Personal history of pneumonia (recurrent): Secondary | ICD-10-CM

## 2022-07-25 DIAGNOSIS — D630 Anemia in neoplastic disease: Secondary | ICD-10-CM | POA: Diagnosis not present

## 2022-07-25 DIAGNOSIS — G40219 Localization-related (focal) (partial) symptomatic epilepsy and epileptic syndromes with complex partial seizures, intractable, without status epilepticus: Secondary | ICD-10-CM | POA: Diagnosis not present

## 2022-07-25 DIAGNOSIS — N189 Chronic kidney disease, unspecified: Secondary | ICD-10-CM | POA: Diagnosis not present

## 2022-07-25 DIAGNOSIS — E038 Other specified hypothyroidism: Secondary | ICD-10-CM | POA: Diagnosis not present

## 2022-07-25 DIAGNOSIS — C716 Malignant neoplasm of cerebellum: Secondary | ICD-10-CM | POA: Diagnosis not present

## 2022-07-25 DIAGNOSIS — D631 Anemia in chronic kidney disease: Secondary | ICD-10-CM | POA: Diagnosis not present

## 2022-07-25 NOTE — Telephone Encounter (Signed)
Pt aware.

## 2022-07-25 NOTE — Telephone Encounter (Signed)
done

## 2022-07-25 NOTE — Telephone Encounter (Signed)
REFERRAL REQUEST Telephone Note  Have you been seen at our office for this problem? Yes (Advise that they may need an appointment with their PCP before a referral can be done)  Reason for Referral: swallowing test Referral discussed with patient: yes  Best contact number of patient for referral team: [phone]    Has patient been seen by a specialist for this issue before: yes  Patient provider preference for referral: speech and language phone [phone] fax [phone] Patient location preference for referral: speech and language phone [phone] fax [phone]   Patient notified that referrals can take up to a week or longer to process. If they haven't heard anything within a week they should call back and speak with the referral department.

## 2022-07-25 NOTE — Telephone Encounter (Signed)
As long as his Ca and Vit D are in normal range, he can be treated for osteoporosis.  MRI shouldn't be an issue

## 2022-07-26 DIAGNOSIS — R1310 Dysphagia, unspecified: Secondary | ICD-10-CM | POA: Diagnosis not present

## 2022-07-26 DIAGNOSIS — G40219 Localization-related (focal) (partial) symptomatic epilepsy and epileptic syndromes with complex partial seizures, intractable, without status epilepticus: Secondary | ICD-10-CM | POA: Diagnosis not present

## 2022-07-26 DIAGNOSIS — M1612 Unilateral primary osteoarthritis, left hip: Secondary | ICD-10-CM | POA: Diagnosis not present

## 2022-07-26 DIAGNOSIS — E038 Other specified hypothyroidism: Secondary | ICD-10-CM | POA: Diagnosis not present

## 2022-07-26 DIAGNOSIS — F79 Unspecified intellectual disabilities: Secondary | ICD-10-CM | POA: Diagnosis not present

## 2022-07-26 DIAGNOSIS — Z8701 Personal history of pneumonia (recurrent): Secondary | ICD-10-CM | POA: Diagnosis not present

## 2022-07-26 DIAGNOSIS — D630 Anemia in neoplastic disease: Secondary | ICD-10-CM | POA: Diagnosis not present

## 2022-07-26 DIAGNOSIS — E291 Testicular hypofunction: Secondary | ICD-10-CM | POA: Diagnosis not present

## 2022-07-26 DIAGNOSIS — E871 Hypo-osmolality and hyponatremia: Secondary | ICD-10-CM | POA: Diagnosis not present

## 2022-07-26 DIAGNOSIS — M810 Age-related osteoporosis without current pathological fracture: Secondary | ICD-10-CM | POA: Diagnosis not present

## 2022-07-26 DIAGNOSIS — F17210 Nicotine dependence, cigarettes, uncomplicated: Secondary | ICD-10-CM | POA: Diagnosis not present

## 2022-07-26 DIAGNOSIS — N189 Chronic kidney disease, unspecified: Secondary | ICD-10-CM | POA: Diagnosis not present

## 2022-07-26 DIAGNOSIS — M222X9 Patellofemoral disorders, unspecified knee: Secondary | ICD-10-CM | POA: Diagnosis not present

## 2022-07-26 DIAGNOSIS — Z7952 Long term (current) use of systemic steroids: Secondary | ICD-10-CM | POA: Diagnosis not present

## 2022-07-26 DIAGNOSIS — J45990 Exercise induced bronchospasm: Secondary | ICD-10-CM | POA: Diagnosis not present

## 2022-07-26 DIAGNOSIS — F0280 Dementia in other diseases classified elsewhere without behavioral disturbance: Secondary | ICD-10-CM | POA: Diagnosis not present

## 2022-07-26 DIAGNOSIS — C716 Malignant neoplasm of cerebellum: Secondary | ICD-10-CM | POA: Diagnosis not present

## 2022-07-26 DIAGNOSIS — E2749 Other adrenocortical insufficiency: Secondary | ICD-10-CM | POA: Diagnosis not present

## 2022-07-26 DIAGNOSIS — Z8673 Personal history of transient ischemic attack (TIA), and cerebral infarction without residual deficits: Secondary | ICD-10-CM | POA: Diagnosis not present

## 2022-07-26 DIAGNOSIS — H903 Sensorineural hearing loss, bilateral: Secondary | ICD-10-CM | POA: Diagnosis not present

## 2022-07-26 DIAGNOSIS — D631 Anemia in chronic kidney disease: Secondary | ICD-10-CM | POA: Diagnosis not present

## 2022-07-27 ENCOUNTER — Telehealth: Payer: Self-pay | Admitting: Family Medicine

## 2022-07-27 DIAGNOSIS — D631 Anemia in chronic kidney disease: Secondary | ICD-10-CM | POA: Diagnosis not present

## 2022-07-27 DIAGNOSIS — G40219 Localization-related (focal) (partial) symptomatic epilepsy and epileptic syndromes with complex partial seizures, intractable, without status epilepticus: Secondary | ICD-10-CM | POA: Diagnosis not present

## 2022-07-27 DIAGNOSIS — N189 Chronic kidney disease, unspecified: Secondary | ICD-10-CM | POA: Diagnosis not present

## 2022-07-27 DIAGNOSIS — C716 Malignant neoplasm of cerebellum: Secondary | ICD-10-CM | POA: Diagnosis not present

## 2022-07-27 DIAGNOSIS — E038 Other specified hypothyroidism: Secondary | ICD-10-CM | POA: Diagnosis not present

## 2022-07-27 DIAGNOSIS — D630 Anemia in neoplastic disease: Secondary | ICD-10-CM | POA: Diagnosis not present

## 2022-07-27 NOTE — Telephone Encounter (Signed)
Patient checking on referral. Please call back.

## 2022-08-01 DIAGNOSIS — G40219 Localization-related (focal) (partial) symptomatic epilepsy and epileptic syndromes with complex partial seizures, intractable, without status epilepticus: Secondary | ICD-10-CM | POA: Diagnosis not present

## 2022-08-01 DIAGNOSIS — E038 Other specified hypothyroidism: Secondary | ICD-10-CM | POA: Diagnosis not present

## 2022-08-01 DIAGNOSIS — E2749 Other adrenocortical insufficiency: Secondary | ICD-10-CM | POA: Diagnosis not present

## 2022-08-01 DIAGNOSIS — N189 Chronic kidney disease, unspecified: Secondary | ICD-10-CM | POA: Diagnosis not present

## 2022-08-01 DIAGNOSIS — D630 Anemia in neoplastic disease: Secondary | ICD-10-CM | POA: Diagnosis not present

## 2022-08-01 DIAGNOSIS — E291 Testicular hypofunction: Secondary | ICD-10-CM | POA: Diagnosis not present

## 2022-08-01 DIAGNOSIS — D631 Anemia in chronic kidney disease: Secondary | ICD-10-CM | POA: Diagnosis not present

## 2022-08-01 DIAGNOSIS — C716 Malignant neoplasm of cerebellum: Secondary | ICD-10-CM | POA: Diagnosis not present

## 2022-08-02 DIAGNOSIS — D631 Anemia in chronic kidney disease: Secondary | ICD-10-CM | POA: Diagnosis not present

## 2022-08-02 DIAGNOSIS — D630 Anemia in neoplastic disease: Secondary | ICD-10-CM | POA: Diagnosis not present

## 2022-08-02 DIAGNOSIS — E038 Other specified hypothyroidism: Secondary | ICD-10-CM | POA: Diagnosis not present

## 2022-08-02 DIAGNOSIS — C716 Malignant neoplasm of cerebellum: Secondary | ICD-10-CM | POA: Diagnosis not present

## 2022-08-02 DIAGNOSIS — N189 Chronic kidney disease, unspecified: Secondary | ICD-10-CM | POA: Diagnosis not present

## 2022-08-02 DIAGNOSIS — G40219 Localization-related (focal) (partial) symptomatic epilepsy and epileptic syndromes with complex partial seizures, intractable, without status epilepticus: Secondary | ICD-10-CM | POA: Diagnosis not present

## 2022-08-04 ENCOUNTER — Telehealth: Payer: Self-pay | Admitting: Family Medicine

## 2022-08-08 NOTE — Telephone Encounter (Signed)
Courtney aware and verbalizes understanding.

## 2022-08-08 NOTE — Telephone Encounter (Signed)
faxed

## 2022-08-08 NOTE — Telephone Encounter (Signed)
I cannot.  The endocrinologist will have ot take care of their own orders.

## 2022-08-08 NOTE — Telephone Encounter (Signed)
Printed and given to [employer]

## 2022-08-09 DIAGNOSIS — M810 Age-related osteoporosis without current pathological fracture: Secondary | ICD-10-CM | POA: Diagnosis not present

## 2022-08-09 DIAGNOSIS — E860 Dehydration: Secondary | ICD-10-CM | POA: Diagnosis not present

## 2022-08-10 DIAGNOSIS — N189 Chronic kidney disease, unspecified: Secondary | ICD-10-CM | POA: Diagnosis not present

## 2022-08-10 DIAGNOSIS — E038 Other specified hypothyroidism: Secondary | ICD-10-CM | POA: Diagnosis not present

## 2022-08-10 DIAGNOSIS — D630 Anemia in neoplastic disease: Secondary | ICD-10-CM | POA: Diagnosis not present

## 2022-08-10 DIAGNOSIS — C716 Malignant neoplasm of cerebellum: Secondary | ICD-10-CM | POA: Diagnosis not present

## 2022-08-10 DIAGNOSIS — D631 Anemia in chronic kidney disease: Secondary | ICD-10-CM | POA: Diagnosis not present

## 2022-08-10 DIAGNOSIS — G40219 Localization-related (focal) (partial) symptomatic epilepsy and epileptic syndromes with complex partial seizures, intractable, without status epilepticus: Secondary | ICD-10-CM | POA: Diagnosis not present

## 2022-08-11 DIAGNOSIS — C716 Malignant neoplasm of cerebellum: Secondary | ICD-10-CM | POA: Diagnosis not present

## 2022-08-11 DIAGNOSIS — G40219 Localization-related (focal) (partial) symptomatic epilepsy and epileptic syndromes with complex partial seizures, intractable, without status epilepticus: Secondary | ICD-10-CM | POA: Diagnosis not present

## 2022-08-11 DIAGNOSIS — E038 Other specified hypothyroidism: Secondary | ICD-10-CM | POA: Diagnosis not present

## 2022-08-11 DIAGNOSIS — D631 Anemia in chronic kidney disease: Secondary | ICD-10-CM | POA: Diagnosis not present

## 2022-08-11 DIAGNOSIS — D630 Anemia in neoplastic disease: Secondary | ICD-10-CM | POA: Diagnosis not present

## 2022-08-11 DIAGNOSIS — N189 Chronic kidney disease, unspecified: Secondary | ICD-10-CM | POA: Diagnosis not present

## 2022-08-16 DIAGNOSIS — G40219 Localization-related (focal) (partial) symptomatic epilepsy and epileptic syndromes with complex partial seizures, intractable, without status epilepticus: Secondary | ICD-10-CM | POA: Diagnosis not present

## 2022-08-16 DIAGNOSIS — D631 Anemia in chronic kidney disease: Secondary | ICD-10-CM | POA: Diagnosis not present

## 2022-08-16 DIAGNOSIS — C716 Malignant neoplasm of cerebellum: Secondary | ICD-10-CM | POA: Diagnosis not present

## 2022-08-16 DIAGNOSIS — E038 Other specified hypothyroidism: Secondary | ICD-10-CM | POA: Diagnosis not present

## 2022-08-16 DIAGNOSIS — N189 Chronic kidney disease, unspecified: Secondary | ICD-10-CM | POA: Diagnosis not present

## 2022-08-16 DIAGNOSIS — D630 Anemia in neoplastic disease: Secondary | ICD-10-CM | POA: Diagnosis not present

## 2022-08-17 ENCOUNTER — Other Ambulatory Visit: Payer: Self-pay | Admitting: Family Medicine

## 2022-08-17 DIAGNOSIS — J9801 Acute bronchospasm: Secondary | ICD-10-CM

## 2022-08-17 DIAGNOSIS — E038 Other specified hypothyroidism: Secondary | ICD-10-CM | POA: Diagnosis not present

## 2022-08-17 DIAGNOSIS — D631 Anemia in chronic kidney disease: Secondary | ICD-10-CM | POA: Diagnosis not present

## 2022-08-17 DIAGNOSIS — R062 Wheezing: Secondary | ICD-10-CM

## 2022-08-17 DIAGNOSIS — D630 Anemia in neoplastic disease: Secondary | ICD-10-CM | POA: Diagnosis not present

## 2022-08-17 DIAGNOSIS — G40219 Localization-related (focal) (partial) symptomatic epilepsy and epileptic syndromes with complex partial seizures, intractable, without status epilepticus: Secondary | ICD-10-CM | POA: Diagnosis not present

## 2022-08-17 DIAGNOSIS — C716 Malignant neoplasm of cerebellum: Secondary | ICD-10-CM | POA: Diagnosis not present

## 2022-08-17 DIAGNOSIS — N189 Chronic kidney disease, unspecified: Secondary | ICD-10-CM | POA: Diagnosis not present

## 2022-08-21 ENCOUNTER — Telehealth: Payer: Self-pay | Admitting: Family Medicine

## 2022-08-21 DIAGNOSIS — D631 Anemia in chronic kidney disease: Secondary | ICD-10-CM | POA: Diagnosis not present

## 2022-08-21 DIAGNOSIS — E038 Other specified hypothyroidism: Secondary | ICD-10-CM | POA: Diagnosis not present

## 2022-08-21 DIAGNOSIS — D630 Anemia in neoplastic disease: Secondary | ICD-10-CM | POA: Diagnosis not present

## 2022-08-21 DIAGNOSIS — C716 Malignant neoplasm of cerebellum: Secondary | ICD-10-CM | POA: Diagnosis not present

## 2022-08-21 DIAGNOSIS — N189 Chronic kidney disease, unspecified: Secondary | ICD-10-CM | POA: Diagnosis not present

## 2022-08-21 DIAGNOSIS — G40219 Localization-related (focal) (partial) symptomatic epilepsy and epileptic syndromes with complex partial seizures, intractable, without status epilepticus: Secondary | ICD-10-CM | POA: Diagnosis not present

## 2022-08-21 NOTE — Telephone Encounter (Signed)
Pts guardian called stating that she dropped off lab order from [facility] last week to see if Dr Lajuana Ripple could order whatever labs that [facility] wanted pt to have done since they couldn't put it on [employer] order form.  Please advise.

## 2022-08-21 NOTE — Telephone Encounter (Signed)
LMOVM VO given for ST recert

## 2022-08-22 ENCOUNTER — Other Ambulatory Visit: Payer: Self-pay | Admitting: Family Medicine

## 2022-08-22 DIAGNOSIS — D630 Anemia in neoplastic disease: Secondary | ICD-10-CM | POA: Diagnosis not present

## 2022-08-22 DIAGNOSIS — E038 Other specified hypothyroidism: Secondary | ICD-10-CM

## 2022-08-22 DIAGNOSIS — G40219 Localization-related (focal) (partial) symptomatic epilepsy and epileptic syndromes with complex partial seizures, intractable, without status epilepticus: Secondary | ICD-10-CM | POA: Diagnosis not present

## 2022-08-22 DIAGNOSIS — E2740 Unspecified adrenocortical insufficiency: Secondary | ICD-10-CM

## 2022-08-22 DIAGNOSIS — C716 Malignant neoplasm of cerebellum: Secondary | ICD-10-CM | POA: Diagnosis not present

## 2022-08-22 DIAGNOSIS — N189 Chronic kidney disease, unspecified: Secondary | ICD-10-CM | POA: Diagnosis not present

## 2022-08-22 DIAGNOSIS — D631 Anemia in chronic kidney disease: Secondary | ICD-10-CM | POA: Diagnosis not present

## 2022-08-22 NOTE — Telephone Encounter (Signed)
done

## 2022-08-22 NOTE — Telephone Encounter (Signed)
Lm making James Hoffman aware

## 2022-08-24 ENCOUNTER — Telehealth: Payer: Self-pay | Admitting: Family Medicine

## 2022-08-24 DIAGNOSIS — R059 Cough, unspecified: Secondary | ICD-10-CM | POA: Diagnosis not present

## 2022-08-24 DIAGNOSIS — Z8701 Personal history of pneumonia (recurrent): Secondary | ICD-10-CM | POA: Diagnosis not present

## 2022-08-24 DIAGNOSIS — D630 Anemia in neoplastic disease: Secondary | ICD-10-CM | POA: Diagnosis not present

## 2022-08-24 DIAGNOSIS — N189 Chronic kidney disease, unspecified: Secondary | ICD-10-CM | POA: Diagnosis not present

## 2022-08-24 DIAGNOSIS — G40219 Localization-related (focal) (partial) symptomatic epilepsy and epileptic syndromes with complex partial seizures, intractable, without status epilepticus: Secondary | ICD-10-CM | POA: Diagnosis not present

## 2022-08-24 DIAGNOSIS — R1310 Dysphagia, unspecified: Secondary | ICD-10-CM | POA: Diagnosis not present

## 2022-08-24 DIAGNOSIS — C716 Malignant neoplasm of cerebellum: Secondary | ICD-10-CM | POA: Diagnosis not present

## 2022-08-24 DIAGNOSIS — D631 Anemia in chronic kidney disease: Secondary | ICD-10-CM | POA: Diagnosis not present

## 2022-08-24 DIAGNOSIS — E038 Other specified hypothyroidism: Secondary | ICD-10-CM | POA: Diagnosis not present

## 2022-08-24 NOTE — Telephone Encounter (Signed)
James Hoffman called from [facility] stating that they received patients order for Regular Barium Swallow but says patient really needs Modified Barium Swallow with speech therapist but needed approval from provider to do so.  Dr Dettinger is covering for Dr Lajuana Ripple today, and he approved change, so I called James Hoffman back and gave her verbal ok to do so per Dr Warrick Parisian.

## 2022-08-25 DIAGNOSIS — J45990 Exercise induced bronchospasm: Secondary | ICD-10-CM | POA: Diagnosis not present

## 2022-08-25 DIAGNOSIS — Z8701 Personal history of pneumonia (recurrent): Secondary | ICD-10-CM | POA: Diagnosis not present

## 2022-08-25 DIAGNOSIS — D631 Anemia in chronic kidney disease: Secondary | ICD-10-CM | POA: Diagnosis not present

## 2022-08-25 DIAGNOSIS — M810 Age-related osteoporosis without current pathological fracture: Secondary | ICD-10-CM | POA: Diagnosis not present

## 2022-08-25 DIAGNOSIS — Z7952 Long term (current) use of systemic steroids: Secondary | ICD-10-CM | POA: Diagnosis not present

## 2022-08-25 DIAGNOSIS — E871 Hypo-osmolality and hyponatremia: Secondary | ICD-10-CM | POA: Diagnosis not present

## 2022-08-25 DIAGNOSIS — M222X9 Patellofemoral disorders, unspecified knee: Secondary | ICD-10-CM | POA: Diagnosis not present

## 2022-08-25 DIAGNOSIS — C716 Malignant neoplasm of cerebellum: Secondary | ICD-10-CM | POA: Diagnosis not present

## 2022-08-25 DIAGNOSIS — F0280 Dementia in other diseases classified elsewhere without behavioral disturbance: Secondary | ICD-10-CM | POA: Diagnosis not present

## 2022-08-25 DIAGNOSIS — F79 Unspecified intellectual disabilities: Secondary | ICD-10-CM | POA: Diagnosis not present

## 2022-08-25 DIAGNOSIS — N189 Chronic kidney disease, unspecified: Secondary | ICD-10-CM | POA: Diagnosis not present

## 2022-08-25 DIAGNOSIS — Z8673 Personal history of transient ischemic attack (TIA), and cerebral infarction without residual deficits: Secondary | ICD-10-CM | POA: Diagnosis not present

## 2022-08-25 DIAGNOSIS — R1310 Dysphagia, unspecified: Secondary | ICD-10-CM | POA: Diagnosis not present

## 2022-08-25 DIAGNOSIS — D630 Anemia in neoplastic disease: Secondary | ICD-10-CM | POA: Diagnosis not present

## 2022-08-25 DIAGNOSIS — E291 Testicular hypofunction: Secondary | ICD-10-CM | POA: Diagnosis not present

## 2022-08-25 DIAGNOSIS — E2749 Other adrenocortical insufficiency: Secondary | ICD-10-CM | POA: Diagnosis not present

## 2022-08-25 DIAGNOSIS — G40219 Localization-related (focal) (partial) symptomatic epilepsy and epileptic syndromes with complex partial seizures, intractable, without status epilepticus: Secondary | ICD-10-CM | POA: Diagnosis not present

## 2022-08-25 DIAGNOSIS — F17210 Nicotine dependence, cigarettes, uncomplicated: Secondary | ICD-10-CM | POA: Diagnosis not present

## 2022-08-25 DIAGNOSIS — H903 Sensorineural hearing loss, bilateral: Secondary | ICD-10-CM | POA: Diagnosis not present

## 2022-08-25 DIAGNOSIS — E038 Other specified hypothyroidism: Secondary | ICD-10-CM | POA: Diagnosis not present

## 2022-08-25 DIAGNOSIS — M1612 Unilateral primary osteoarthritis, left hip: Secondary | ICD-10-CM | POA: Diagnosis not present

## 2022-09-05 ENCOUNTER — Other Ambulatory Visit: Payer: Self-pay | Admitting: Nurse Practitioner

## 2022-09-05 ENCOUNTER — Other Ambulatory Visit: Payer: Self-pay | Admitting: Family Medicine

## 2022-09-05 DIAGNOSIS — M25462 Effusion, left knee: Secondary | ICD-10-CM

## 2022-09-08 DIAGNOSIS — N189 Chronic kidney disease, unspecified: Secondary | ICD-10-CM | POA: Diagnosis not present

## 2022-09-08 DIAGNOSIS — E038 Other specified hypothyroidism: Secondary | ICD-10-CM | POA: Diagnosis not present

## 2022-09-08 DIAGNOSIS — R1310 Dysphagia, unspecified: Secondary | ICD-10-CM | POA: Diagnosis not present

## 2022-09-08 DIAGNOSIS — D631 Anemia in chronic kidney disease: Secondary | ICD-10-CM | POA: Diagnosis not present

## 2022-09-08 DIAGNOSIS — C716 Malignant neoplasm of cerebellum: Secondary | ICD-10-CM | POA: Diagnosis not present

## 2022-09-08 DIAGNOSIS — D630 Anemia in neoplastic disease: Secondary | ICD-10-CM | POA: Diagnosis not present

## 2022-09-11 ENCOUNTER — Encounter: Payer: Self-pay | Admitting: Family Medicine

## 2022-09-11 ENCOUNTER — Ambulatory Visit (INDEPENDENT_AMBULATORY_CARE_PROVIDER_SITE_OTHER): Payer: Medicare Other | Admitting: Family Medicine

## 2022-09-11 VITALS — BP 116/80 | HR 95 | Temp 97.6°F | Ht 68.0 in | Wt 124.0 lb

## 2022-09-11 DIAGNOSIS — R32 Unspecified urinary incontinence: Secondary | ICD-10-CM

## 2022-09-11 DIAGNOSIS — R4189 Other symptoms and signs involving cognitive functions and awareness: Secondary | ICD-10-CM | POA: Diagnosis not present

## 2022-09-11 DIAGNOSIS — Z7409 Other reduced mobility: Secondary | ICD-10-CM | POA: Diagnosis not present

## 2022-09-11 DIAGNOSIS — Z87898 Personal history of other specified conditions: Secondary | ICD-10-CM | POA: Diagnosis not present

## 2022-09-11 MED ORDER — DONEPEZIL HCL 5 MG PO TABS: 5.0000 mg | ORAL_TABLET | Freq: Every day | ORAL | 0 refills | Status: DC

## 2022-09-11 NOTE — Patient Instructions (Addendum)
Dr Jerrye Noble- hematologist/ oncologist (cancer doctor)  [facility] [place], [place] [address]   [phone] (Work)   [phone] (Fax)    Dr Sabra Heck, Delfino Lovett, MD  (Neurologist- seizures/ memory) [place], [place] [address]   [phone] (Work)   [phone] (Fax)    Dr Barton Dubois- Endocrinologist (thyroid/ testosterone) [place], [place] [address]   [phone] (Work)   [phone] (Fax)

## 2022-09-11 NOTE — Progress Notes (Signed)
I have separately seen and examined the patient. I have discussed the findings and exam with student Dr Jerline Pain and agree with the below note.  My changes/additions are outlined in BLUE.    S: Patient is brought to the office by his grandmother and is accompanied by his niece.  She reports that he has been having increasing memory issues, particularly after his hospitalization where he was running a fever.  This occurred about 2 to 3 months ago.  She notes that he has been having increasing visual and auditory hallucinations.  Sometimes these hallucinations are scary to James Hoffman.  She cites recently that he was sitting on a porch and had to come back inside briskly because he thought there were people outside walking with guns.  He is also visualized some young lady walking by his grandmother even though there is no one behind her.  He apparently was discharged with benazepril but she could not get a refill on this because it was provided by the hospitalist.  She denies any seizure activity.  His previous caregiver, [name], is no longer helping him and she needs information on how to get him scheduled with his multiple specialists.  They have not followed up with neurology since this has been ongoing.  Additionally, she continues to give him Ensure 2-3 times per day.  His appetite has been a little bit better and he actually ate eggs this morning.  She has plenty of incontinence supplies but he has urinary incontinence.  O: Vitals:   09/11/22 1504  BP: 116/80  Pulse: 95  Temp: 97.6 F (36.4 C)  SpO2: 96%    BP 116/80   Pulse 95   Temp 97.6 F (36.4 C)   Ht '5\' 8"'$  (1.727 m)   Wt 124 lb (56.2 kg)   SpO2 96%   BMI 18.85 kg/m  General appearance: alert, appears stated age, and distracted Eyes:  EOMI.  Has opaque lens on the left iris Lungs:  Normal work of breathing on room air Psych: Appears to be responding to internal stimuli Neuro: Very hard of hearing    11/29/2021    4:35 PM  02/24/2020   12:16 PM  MMSE - Mini Mental State Exam  Not completed: Unable to complete Unable to complete    A/P:  Impaired functional mobility, balance, gait, and endurance  Urinary incontinence, unspecified type  History of brain tumor  Cognitive impairment - Plan: donepezil (ARICEPT) 5 MG tablet  Continues to require quite extensive care.  I have signed his order forms for incontinence supplies and Ensure.  We will reinitiate the Aricept at 5 mg nightly but I would like him to see his neurologist ASAP.  We will CC this note to them.  I have given all the information for his specialist to his grandmother in efforts to get her to schedule appointments appropriately.  We reviewed the impact that hearing loss and lack of use of hearing aids can have on ongoing cognitive decline.  MMSE was attempted today but unfortunately he cannot cooperate fully with exam  James Hoffman M. Lajuana Ripple, [facility]   -------------------------------------------------------------------------------------------------------------------------------------------------------------------------------------       Subjective: HY:QMVHQION PCP: [name], DO GEX:[id number] D Steve is a 26 y.o. male presenting to clinic today for:  James Hoffman experienced a hospitalization approximately 3 months ago due to a severe fever, which exceeded 105 degrees. Following this hospitalization, it has become evident that his dementia has been progressing. While a speech therapist continues to  provide care, he has not been receiving the same levels of nursing and physical therapy care as previous. James Hoffman has been grappling with visual hallucinations in the past few weeks. He has not been experiencing seizures.   Continues to have significant visual and hearing impairment. He frequently removes or breaks hearing aids. The patient's family has been facing significant challenges in maintaining care  consistency and coordination. His grandmother reports that an aunt who organized Mayer's care recently stepped back from from accompanying him to medical appointments. James Hoffman recently missed a scheduled neurologist appointment.  ROS: Per HPI  Allergies  Allergen Reactions   Tramadol Anaphylaxis   Morphine Nausea And Vomiting    According to mother the patient is intolerant of morphine; causes nausea and vomiting.  Has had none since he was 26yo.   Past Medical History:  Diagnosis Date   Adrenal insufficiency ([place])    Cancer ([place])    brain tumor on brain stem   Hydrocephalus ([place])    Osteoporosis    Thyroid disease     Current Outpatient Medications:    atorvastatin (LIPITOR) 20 MG tablet, Take 20 mg by mouth daily., Disp: , Rfl:    baclofen (LIORESAL) 10 MG tablet, Take 1 tablet (10 mg total) by mouth 3 (three) times daily as needed for muscle spasms. To REPLACE tizanidine, Disp: 60 tablet, Rfl: 1   busPIRone (BUSPAR) 10 MG tablet, TAKE 1 TABLET (10 MG TOTAL) BY MOUTH 3 (THREE) TIMES DAILY. FOR ANXIETY, Disp: 270 tablet, Rfl: 0   cetirizine (ZYRTEC) 10 MG tablet, Take 1 tablet (10 mg total) by mouth daily., Disp: 30 tablet, Rfl: 11   Cholecalciferol 50 MCG (2000 UT) CAPS, Take by mouth., Disp: , Rfl:    clotrimazole-betamethasone (LOTRISONE) cream, APPLY 1 APPLICATION TOPICALLY 2 (TWO) TIMES DAILY. X 7-10 DAYS, Disp: 30 g, Rfl: 0   escitalopram (LEXAPRO) 10 MG tablet, Take 1 tablet (10 mg total) by mouth daily., Disp: 90 tablet, Rfl: 3   FLOVENT HFA 44 MCG/ACT inhaler, INHALE 2 PUFFS INTO THE LUNGS TWICE A DAY, Disp: 10.6 each, Rfl: 11   fluticasone (FLONASE) 50 MCG/ACT nasal spray, SPRAY 2 SPRAYS INTO EACH NOSTRIL EVERY DAY, Disp: 48 mL, Rfl: 1   levETIRAcetam (KEPPRA) 750 MG tablet, Take 1,500 mg by mouth in the morning and at bedtime., Disp: , Rfl:    levothyroxine (SYNTHROID) 88 MCG tablet, Take 88 mcg by mouth daily before breakfast., Disp: , Rfl:    omeprazole (PRILOSEC) 20  MG capsule, TAKE 1 CAPSULE (20 MG TOTAL) BY MOUTH DAILY. AT LUNCH TIME., Disp: 90 capsule, Rfl: 3   potassium chloride SA (KLOR-CON M) 20 MEQ tablet, Take 1 tablet (20 mEq total) by mouth 2 (two) times daily., Disp: 30 tablet, Rfl: 2   prednisoLONE 5 MG TABS tablet, Take by mouth., Disp: , Rfl:    Spacer/Aero-Holding Chambers (AEROCHAMBER PLUS) inhaler, Use as instructed, Disp: 1 each, Rfl: 2   Testosterone 20.25 MG/ACT (1.62%) GEL, ONE PUM ON EACH SHOULDER EVERY MORNIG, Disp: 75 g, Rfl: 1   VENTOLIN HFA 108 (90 Base) MCG/ACT inhaler, TAKE 2 PUFFS BY MOUTH EVERY 6 HOURS AS NEEDED FOR WHEEZE OR SHORTNESS OF BREATH, Disp: 18 each, Rfl: 2 Social History   Socioeconomic History   Marital status: Single    Spouse name: Not on file   Number of children: 0   Years of education: 7th grade   Highest education level: 7th grade  Occupational History   Occupation: disabled  Tobacco Use  Smoking status: Every Day    Packs/day: 0.25    Years: 6.00    Total pack years: 1.50    Types: Cigarettes    Start date: 11/25/2012   Smokeless tobacco: Never  Vaping Use   Vaping Use: Never used  Substance and Sexual Activity   Alcohol use: No   Drug use: No   Sexual activity: Not Currently  Other Topics Concern   Not on file  Social History Narrative   Lives with his grandmother   Mostly wheelchair bound   Sometimes he uses walker when knees aren't hurt   Mostly dependent on others for assistance - eats on his own, but can't prepare meals, has to have help in and out of shower, but bathes himself, etc.   Social Determinants of Health   Financial Resource Strain: Low Risk  (11/29/2021)   Overall Financial Resource Strain (CARDIA)    Difficulty of Paying Living Expenses: Not hard at all  Food Insecurity: No Food Insecurity (11/29/2021)   Hunger Vital Sign    Worried About Running Out of Food in the Last Year: Never true    [place] in the Last Year: Never true  Transportation Needs: No  Transportation Needs (11/29/2021)   PRAPARE - Hydrologist (Medical): No    Lack of Transportation (Non-Medical): No  Physical Activity: Insufficiently Active (11/29/2021)   Exercise Vital Sign    Days of Exercise per Week: 7 days    Minutes of Exercise per Session: 20 min  Stress: Stress Concern Present (12/27/2021)   [place]    Feeling of Stress : To some extent  Social Connections: Socially Isolated (11/29/2021)   Social Connection and Isolation Panel [NHANES]    Frequency of Communication with Friends and Family: More than three times a week    Frequency of Social Gatherings with Friends and Family: More than three times a week    Attends Religious Services: Never    Marine scientist or Organizations: No    Attends Archivist Meetings: Never    Marital Status: Never married  Intimate Partner Violence: Not At Risk (11/29/2021)   Humiliation, Afraid, Rape, and Kick questionnaire    Fear of Current or Ex-Partner: No    Emotionally Abused: No    Physically Abused: No    Sexually Abused: No   History reviewed. No pertinent family history.  Objective: Office vital signs reviewed. BP 116/80   Pulse 95   Temp 97.6 F (36.4 C)   Ht '5\' 8"'$  (1.727 m)   Wt 124 lb (56.2 kg)   SpO2 96%   BMI 18.85 kg/m   Physical Examination:  General: Awake, moderately malnourished male in no acute distress HEENT: Normal    Neck: No masses palpated. No lymphadenopathy    Ears: Tympanic membranes intact, normal light reflex, no erythema, no bulging    Eyes: PERRLA, extraocular membranes intact, sclera white, clouding evident in left eye    Nose: nasal turbinates moist, no nasal discharge Cardio: regular rate and rhythm, S1S2 heard, no murmurs appreciated Pulm: clear to auscultation bilaterally, no wheezes, rhonchi or rales; normal work of breathing on room air Extremities: thin, warm, well  perfused, No edema, cyanosis or clubbing; +1 pulses bilaterally Neuro: Pupils constrict in response to light, global weakness is present.   Assessment/ Plan: Rojelio Uhrich is a 26 y.o. male with a complex medical history including medulloblastoma, adrenal  insufficiency, encephalopathy, and recent hospitalization in the ICU now presenting with worsening dementia.   Given the reported improvement with the previous dose of donepezil, I will prescribe a 5 mg dose.  The patient requires an appointment with a neurologist due to the recent decline in mental status. I will compile a list of the specialist providers the patient is currently seeing and provide this list to the patient's grandmother, who will coordinate the scheduling. The family indicated their understanding of this plan.  We discussed the significance of the patient wearing his hearing aids whenever possible, as there is a known association between hearing loss and the worsening of dementia symptoms. During the evaluation, the patient's limited hearing capabilities made it challenging to fully assess his participation in the [place] (MMSE), resulting in a total score of approximately 5.  I have initiated the submission of necessary paperwork for financial assistance to secure adult care items, including cleaning supplies and nutritional shakes.  Stephani Police, MS3

## 2022-09-21 ENCOUNTER — Other Ambulatory Visit: Payer: Self-pay | Admitting: Nurse Practitioner

## 2022-09-22 DIAGNOSIS — E038 Other specified hypothyroidism: Secondary | ICD-10-CM | POA: Diagnosis not present

## 2022-09-22 DIAGNOSIS — D631 Anemia in chronic kidney disease: Secondary | ICD-10-CM | POA: Diagnosis not present

## 2022-09-22 DIAGNOSIS — C716 Malignant neoplasm of cerebellum: Secondary | ICD-10-CM | POA: Diagnosis not present

## 2022-09-22 DIAGNOSIS — D630 Anemia in neoplastic disease: Secondary | ICD-10-CM | POA: Diagnosis not present

## 2022-09-22 DIAGNOSIS — N189 Chronic kidney disease, unspecified: Secondary | ICD-10-CM | POA: Diagnosis not present

## 2022-09-22 DIAGNOSIS — R1310 Dysphagia, unspecified: Secondary | ICD-10-CM | POA: Diagnosis not present

## 2022-09-24 DIAGNOSIS — H903 Sensorineural hearing loss, bilateral: Secondary | ICD-10-CM | POA: Diagnosis not present

## 2022-09-24 DIAGNOSIS — F0280 Dementia in other diseases classified elsewhere without behavioral disturbance: Secondary | ICD-10-CM | POA: Diagnosis not present

## 2022-09-24 DIAGNOSIS — Z8701 Personal history of pneumonia (recurrent): Secondary | ICD-10-CM | POA: Diagnosis not present

## 2022-09-24 DIAGNOSIS — J45990 Exercise induced bronchospasm: Secondary | ICD-10-CM | POA: Diagnosis not present

## 2022-09-24 DIAGNOSIS — D631 Anemia in chronic kidney disease: Secondary | ICD-10-CM | POA: Diagnosis not present

## 2022-09-24 DIAGNOSIS — E2749 Other adrenocortical insufficiency: Secondary | ICD-10-CM | POA: Diagnosis not present

## 2022-09-24 DIAGNOSIS — E871 Hypo-osmolality and hyponatremia: Secondary | ICD-10-CM | POA: Diagnosis not present

## 2022-09-24 DIAGNOSIS — G40219 Localization-related (focal) (partial) symptomatic epilepsy and epileptic syndromes with complex partial seizures, intractable, without status epilepticus: Secondary | ICD-10-CM | POA: Diagnosis not present

## 2022-09-24 DIAGNOSIS — E038 Other specified hypothyroidism: Secondary | ICD-10-CM | POA: Diagnosis not present

## 2022-09-24 DIAGNOSIS — M1612 Unilateral primary osteoarthritis, left hip: Secondary | ICD-10-CM | POA: Diagnosis not present

## 2022-09-24 DIAGNOSIS — M222X9 Patellofemoral disorders, unspecified knee: Secondary | ICD-10-CM | POA: Diagnosis not present

## 2022-09-24 DIAGNOSIS — F79 Unspecified intellectual disabilities: Secondary | ICD-10-CM | POA: Diagnosis not present

## 2022-09-24 DIAGNOSIS — R1310 Dysphagia, unspecified: Secondary | ICD-10-CM | POA: Diagnosis not present

## 2022-09-24 DIAGNOSIS — Z7952 Long term (current) use of systemic steroids: Secondary | ICD-10-CM | POA: Diagnosis not present

## 2022-09-24 DIAGNOSIS — C716 Malignant neoplasm of cerebellum: Secondary | ICD-10-CM | POA: Diagnosis not present

## 2022-09-24 DIAGNOSIS — E291 Testicular hypofunction: Secondary | ICD-10-CM | POA: Diagnosis not present

## 2022-09-24 DIAGNOSIS — M810 Age-related osteoporosis without current pathological fracture: Secondary | ICD-10-CM | POA: Diagnosis not present

## 2022-09-24 DIAGNOSIS — D630 Anemia in neoplastic disease: Secondary | ICD-10-CM | POA: Diagnosis not present

## 2022-09-24 DIAGNOSIS — Z8673 Personal history of transient ischemic attack (TIA), and cerebral infarction without residual deficits: Secondary | ICD-10-CM | POA: Diagnosis not present

## 2022-09-24 DIAGNOSIS — F17210 Nicotine dependence, cigarettes, uncomplicated: Secondary | ICD-10-CM | POA: Diagnosis not present

## 2022-09-24 DIAGNOSIS — N189 Chronic kidney disease, unspecified: Secondary | ICD-10-CM | POA: Diagnosis not present

## 2022-10-06 DIAGNOSIS — R1310 Dysphagia, unspecified: Secondary | ICD-10-CM | POA: Diagnosis not present

## 2022-10-06 DIAGNOSIS — E038 Other specified hypothyroidism: Secondary | ICD-10-CM | POA: Diagnosis not present

## 2022-10-06 DIAGNOSIS — D630 Anemia in neoplastic disease: Secondary | ICD-10-CM | POA: Diagnosis not present

## 2022-10-06 DIAGNOSIS — D631 Anemia in chronic kidney disease: Secondary | ICD-10-CM | POA: Diagnosis not present

## 2022-10-06 DIAGNOSIS — C716 Malignant neoplasm of cerebellum: Secondary | ICD-10-CM | POA: Diagnosis not present

## 2022-10-06 DIAGNOSIS — N189 Chronic kidney disease, unspecified: Secondary | ICD-10-CM | POA: Diagnosis not present

## 2022-10-09 ENCOUNTER — Telehealth: Payer: Self-pay | Admitting: *Deleted

## 2022-10-09 NOTE — Telephone Encounter (Signed)
FYI: TC from [place] w/ [place] Pt has modified barium swallow recently and passed it, she did education w/ caregiver, will discharge pt from [facility] on 10/13/22. VO giving ok.

## 2022-10-13 DIAGNOSIS — D631 Anemia in chronic kidney disease: Secondary | ICD-10-CM | POA: Diagnosis not present

## 2022-10-13 DIAGNOSIS — R1310 Dysphagia, unspecified: Secondary | ICD-10-CM | POA: Diagnosis not present

## 2022-10-13 DIAGNOSIS — N189 Chronic kidney disease, unspecified: Secondary | ICD-10-CM | POA: Diagnosis not present

## 2022-10-13 DIAGNOSIS — E038 Other specified hypothyroidism: Secondary | ICD-10-CM | POA: Diagnosis not present

## 2022-10-13 DIAGNOSIS — D630 Anemia in neoplastic disease: Secondary | ICD-10-CM | POA: Diagnosis not present

## 2022-10-13 DIAGNOSIS — C716 Malignant neoplasm of cerebellum: Secondary | ICD-10-CM | POA: Diagnosis not present

## 2022-11-03 DIAGNOSIS — G40219 Localization-related (focal) (partial) symptomatic epilepsy and epileptic syndromes with complex partial seizures, intractable, without status epilepticus: Secondary | ICD-10-CM | POA: Diagnosis not present

## 2022-11-03 DIAGNOSIS — Z79899 Other long term (current) drug therapy: Secondary | ICD-10-CM | POA: Diagnosis not present

## 2022-11-03 DIAGNOSIS — G9340 Encephalopathy, unspecified: Secondary | ICD-10-CM | POA: Diagnosis not present

## 2022-11-15 ENCOUNTER — Other Ambulatory Visit: Payer: Self-pay | Admitting: Family Medicine

## 2022-11-15 DIAGNOSIS — F419 Anxiety disorder, unspecified: Secondary | ICD-10-CM

## 2022-11-16 ENCOUNTER — Encounter: Payer: Self-pay | Admitting: Nurse Practitioner

## 2022-11-16 ENCOUNTER — Telehealth (INDEPENDENT_AMBULATORY_CARE_PROVIDER_SITE_OTHER): Payer: Medicare Other | Admitting: Nurse Practitioner

## 2022-11-16 DIAGNOSIS — J029 Acute pharyngitis, unspecified: Secondary | ICD-10-CM

## 2022-11-16 DIAGNOSIS — Z20818 Contact with and (suspected) exposure to other bacterial communicable diseases: Secondary | ICD-10-CM | POA: Diagnosis not present

## 2022-11-16 MED ORDER — AMOXICILLIN 875 MG PO TABS: 875.0000 mg | ORAL_TABLET | Freq: Two times a day (BID) | ORAL | 0 refills | Status: DC

## 2022-11-16 NOTE — Progress Notes (Signed)
Virtual Visit Consent   James Hoffman, you are scheduled for a virtual visit with James Hoffman, [place], a [facility] provider, today.     Just as with appointments in the office, your consent must be obtained to participate.  Your consent will be active for this visit and any virtual visit you may have with one of our providers in the next 365 days.     If you have a MyChart account, a copy of this consent can be sent to you electronically.  All virtual visits are billed to your insurance company just like a traditional visit in the office.    As this is a virtual visit, video technology does not allow for your provider to perform a traditional examination.  This may limit your provider's ability to fully assess your condition.  If your provider identifies any concerns that need to be evaluated in person or the need to arrange testing (such as labs, EKG, etc.), we will make arrangements to do so.     Although advances in technology are sophisticated, we cannot ensure that it will always work on either your end or our end.  If the connection with a video visit is poor, the visit may have to be switched to a telephone visit.  With either a video or telephone visit, we are not always able to ensure that we have a secure connection.     I need to obtain your verbal consent now.   Are you willing to proceed with your visit today? YES   LEDFORD GOODSON has provided verbal consent on 11/16/2022 for a virtual visit (video or telephone).   James Hassell Done, FNP   Date: 11/16/2022 2:02 PM   Virtual Visit via Video Note   I, James Hoffman, connected with James Hoffman ([id number], Jan 12, 1996) on 11/16/22 at  2:00 PM EST by a video-enabled telemedicine application and verified that I am speaking with the correct person using two identifiers.  Location: Patient: Virtual Visit Location Patient: Home Provider: Virtual Visit Location Provider: Mobile   I discussed the  limitations of evaluation and management by telemedicine and the availability of in person appointments. The patient expressed understanding and agreed to proceed.    History of Present Illness: James Hoffman is a 27 y.o. who identifies as a male who was assigned male at birth, and is being seen today for uri.  HPI: Was exposed to strep throat last week.   URI  This is a new problem. The current episode started yesterday. The problem has been rapidly worsening. There has been no fever. Associated symptoms include congestion, coughing, rhinorrhea and a sore throat. Pertinent negatives include no headaches. He has tried acetaminophen for the symptoms. The treatment provided mild relief.    Review of Systems  HENT:  Positive for congestion, rhinorrhea and sore throat.   Respiratory:  Positive for cough.   Neurological:  Negative for headaches.    Problems:  Patient Active Problem List   Diagnosis Date Noted   Abscess after procedure 06/12/2021   AVN (avascular necrosis of bone) ([place]) 05/30/2021   'light-for-dates' infant with signs of fetal malnutrition 02/11/2021   Partial symptomatic epilepsy with complex partial seizures, intractable, without status epilepticus ([place]) 09/13/2020   Left arm weakness 09/09/2020   Sepsis ([place]) 09/09/2020   Aspiration pneumonia of right lung ([place]) 09/09/2020   Impaired functional mobility, balance, gait, and endurance 08/15/2020   Leukocytosis (leucocytosis) 08/15/2020   Abnormal chest x-ray 06/24/2020  Decreased lung sounds 06/24/2020   Wound check, abscess 05/20/2020   Depression, major, single episode, severe ([place]) 05/20/2020   Abscess of right buttock 04/06/2020   Impacted cerumen of both ears 02/17/2020   Disorientation 02/12/2020   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[place]) 12/09/2019   Arthritis of left hip 10/31/2019   Left leg pain 10/31/2019   Blindness and low vision 10/29/2019   History of eye surgery 10/29/2019   History of brain tumor 10/29/2019    Encephalopathy 03/03/2019   Hydrocephalus ([place]) 03/03/2019   S/P VP shunt 03/03/2019   Hearing loss of left ear 05/12/2018   Acute respiratory failure with hypoxia ([place]) 03/20/2018   Low bone mass 06/21/2017   At risk for falls 03/13/2017   Current chronic use of systemic steroids 03/13/2017   Current smoker 03/13/2017   Drug-induced osteoporosis 03/13/2017   Excessive caffeine intake 03/13/2017   History of chemotherapy 03/13/2017   Low testosterone in male 03/13/2017   History of radiation therapy 03/13/2017   Status post orthopedic surgery, follow-up exam 02/22/2017   SIRS (systemic inflammatory response syndrome) ([place]) 02/13/2017   Closed displaced subtrochanteric fracture of left femur ([place]) 02/12/2017   Gait instability 12/19/2016   Hypogonadism, male 07/19/2016   Normocytic anemia 04/14/2016   Muscular deconditioning 09/17/2015   Hypothyroidism 09/09/2015   Medullary carcinoma ([place]) 09/09/2015   Adrenal insufficiency ([place]) 09/09/2015   Hypotension 09/09/2015   Hyponatremia 09/09/2015   Hypokalemia 09/09/2015   HSP ([place] purpura) ([place]) 11/11/2013   Secondary adrenal insufficiency ([place]) 11/11/2013   Left anterior knee pain 05/29/2013   Patellofemoral pain syndrome 05/29/2013   Exercise induced bronchospasm 03/07/2013   Posterior subcapsular cataract 01/09/2013   Pseudophakia of right eye 01/09/2013   Headache 03/15/2012   Sensorineural hearing loss (SNHL), bilateral 08/04/2011    Allergies:  Allergies  Allergen Reactions   Tramadol Anaphylaxis   Morphine Nausea And Vomiting    According to mother the patient is intolerant of morphine; causes nausea and vomiting.  Has had none since he was 27yo.   Medications:  Current Outpatient Medications:    atorvastatin (LIPITOR) 20 MG tablet, Take 20 mg by mouth daily., Disp: , Rfl:    atorvastatin (LIPITOR) 40 MG tablet, TAKE 1 TABLET BY MOUTH EVERYDAY AT BEDTIME, Disp: 90 tablet, Rfl: 1   baclofen (LIORESAL) 10  MG tablet, Take 1 tablet (10 mg total) by mouth 3 (three) times daily as needed for muscle spasms. To REPLACE tizanidine, Disp: 60 tablet, Rfl: 1   busPIRone (BUSPAR) 10 MG tablet, TAKE 1 TABLET (10 MG TOTAL) BY MOUTH 3 (THREE) TIMES DAILY. FOR ANXIETY, Disp: 270 tablet, Rfl: 0   cetirizine (ZYRTEC) 10 MG tablet, Take 1 tablet (10 mg total) by mouth daily., Disp: 30 tablet, Rfl: 11   Cholecalciferol 50 MCG (2000 UT) CAPS, Take by mouth., Disp: , Rfl:    clotrimazole-betamethasone (LOTRISONE) cream, APPLY 1 APPLICATION TOPICALLY 2 (TWO) TIMES DAILY. X 7-10 DAYS, Disp: 30 g, Rfl: 0   donepezil (ARICEPT) 5 MG tablet, Take 1 tablet (5 mg total) by mouth at bedtime. Further fills per Neuro, Disp: 90 tablet, Rfl: 0   escitalopram (LEXAPRO) 10 MG tablet, Take 1 tablet (10 mg total) by mouth daily., Disp: 90 tablet, Rfl: 3   FLOVENT HFA 44 MCG/ACT inhaler, INHALE 2 PUFFS INTO THE LUNGS TWICE A DAY, Disp: 10.6 each, Rfl: 11   fluticasone (FLONASE) 50 MCG/ACT nasal spray, SPRAY 2 SPRAYS INTO EACH NOSTRIL EVERY DAY, Disp: 48 mL, Rfl: 1   levETIRAcetam (  KEPPRA) 750 MG tablet, Take 1,500 mg by mouth in the morning and at bedtime., Disp: , Rfl:    levothyroxine (SYNTHROID) 88 MCG tablet, Take 88 mcg by mouth daily before breakfast., Disp: , Rfl:    omeprazole (PRILOSEC) 20 MG capsule, TAKE 1 CAPSULE (20 MG TOTAL) BY MOUTH DAILY. AT LUNCH TIME., Disp: 90 capsule, Rfl: 3   potassium chloride SA (KLOR-CON M) 20 MEQ tablet, Take 1 tablet (20 mEq total) by mouth 2 (two) times daily., Disp: 30 tablet, Rfl: 2   prednisoLONE 5 MG TABS tablet, Take by mouth., Disp: , Rfl:    Spacer/Aero-Holding Chambers (AEROCHAMBER PLUS) inhaler, Use as instructed, Disp: 1 each, Rfl: 2   Testosterone 20.25 MG/ACT (1.62%) GEL, ONE PUM ON EACH SHOULDER EVERY MORNIG, Disp: 75 g, Rfl: 1   VENTOLIN HFA 108 (90 Base) MCG/ACT inhaler, TAKE 2 PUFFS BY MOUTH EVERY 6 HOURS AS NEEDED FOR WHEEZE OR SHORTNESS OF BREATH, Disp: 18 each, Rfl:  2  Observations/Objective: Patient is well-developed, well-nourished in no acute distress.  Resting comfortably  at home.  Head is normocephalic, atraumatic.  No labored breathing.  Speech is clear and coherent with logical content.  Patient is alert and oriented at baseline.  Raspy voice Deep cough Caregiver says throat is red.  Assessment and Plan:  JATINDER MCDONAGH in today with chief complaint of No chief complaint on file.   1. Pharyngitis, unspecified etiology 2. Streptococcus exposure Force fluids Motrin or tylenol OTC OTC decongestant Throat lozenges if help New toothbrush in 3 days  Meds ordered this encounter  Medications   amoxicillin (AMOXIL) 875 MG tablet    Sig: Take 1 tablet (875 mg total) by mouth 2 (two) times daily. 1 po BID    Dispense:  14 tablet    Refill:  0    Order Specific Question:   Supervising Provider    Answer:   Caryl Pina A [[id number]]   Suggested covid testing- family will do home test.    Follow Up Instructions: I discussed the assessment and treatment plan with the patient. The patient was provided an opportunity to ask questions and all were answered. The patient agreed with the plan and demonstrated an understanding of the instructions.  A copy of instructions were sent to the patient via MyChart.  The patient was advised to call back or seek an in-person evaluation if the symptoms worsen or if the condition fails to improve as anticipated.  Time:  I spent 7 minutes with the patient via telehealth technology discussing the above problems/concerns.    James Hassell Done, FNP

## 2022-11-16 NOTE — Patient Instructions (Signed)
Force fluids °Motrin or tylenol OTC °OTC decongestant °Throat lozenges if help °New toothbrush in 3 days ° °

## 2022-11-18 ENCOUNTER — Other Ambulatory Visit: Payer: Self-pay | Admitting: Family Medicine

## 2022-11-18 DIAGNOSIS — B9689 Other specified bacterial agents as the cause of diseases classified elsewhere: Secondary | ICD-10-CM

## 2022-11-18 DIAGNOSIS — E2749 Other adrenocortical insufficiency: Secondary | ICD-10-CM

## 2022-11-18 DIAGNOSIS — M25462 Effusion, left knee: Secondary | ICD-10-CM

## 2022-11-18 DIAGNOSIS — Z7409 Other reduced mobility: Secondary | ICD-10-CM

## 2022-12-01 ENCOUNTER — Other Ambulatory Visit: Payer: Self-pay | Admitting: Family Medicine

## 2022-12-01 DIAGNOSIS — K219 Gastro-esophageal reflux disease without esophagitis: Secondary | ICD-10-CM

## 2022-12-01 DIAGNOSIS — R10826 Epigastric rebound abdominal tenderness: Secondary | ICD-10-CM

## 2022-12-06 ENCOUNTER — Other Ambulatory Visit: Payer: Self-pay | Admitting: Family Medicine

## 2022-12-06 DIAGNOSIS — M25462 Effusion, left knee: Secondary | ICD-10-CM

## 2022-12-08 ENCOUNTER — Other Ambulatory Visit: Payer: Self-pay | Admitting: Family Medicine

## 2022-12-08 DIAGNOSIS — R4189 Other symptoms and signs involving cognitive functions and awareness: Secondary | ICD-10-CM

## 2022-12-08 NOTE — Telephone Encounter (Signed)
Would like Neuro recommendations before refilling since '5mg'$  did not make a difference.

## 2022-12-21 DIAGNOSIS — R1312 Dysphagia, oropharyngeal phase: Secondary | ICD-10-CM | POA: Diagnosis not present

## 2022-12-21 DIAGNOSIS — R1310 Dysphagia, unspecified: Secondary | ICD-10-CM | POA: Diagnosis not present

## 2022-12-28 ENCOUNTER — Other Ambulatory Visit: Payer: Self-pay | Admitting: Family Medicine

## 2022-12-28 DIAGNOSIS — F419 Anxiety disorder, unspecified: Secondary | ICD-10-CM

## 2022-12-29 DIAGNOSIS — G40219 Localization-related (focal) (partial) symptomatic epilepsy and epileptic syndromes with complex partial seizures, intractable, without status epilepticus: Secondary | ICD-10-CM | POA: Diagnosis not present

## 2022-12-29 DIAGNOSIS — I6782 Cerebral ischemia: Secondary | ICD-10-CM | POA: Diagnosis not present

## 2022-12-29 DIAGNOSIS — G9340 Encephalopathy, unspecified: Secondary | ICD-10-CM | POA: Diagnosis not present

## 2023-01-08 ENCOUNTER — Telehealth: Payer: Self-pay | Admitting: Family Medicine

## 2023-01-08 NOTE — Telephone Encounter (Signed)
Called patient to schedule Medicare Annual Wellness Visit (AWV). Left message for patient to call back and schedule Medicare Annual Wellness Visit (AWV).  Last date of AWV: 11/29/2021   Please schedule an appointment at any time with either Mickel Baas or [place], NHA's. .  If any questions, please contact me at [phone].  Thank you,  Colletta Maryland,  [place] [facility] Direct Dial ??[phone]

## 2023-01-09 ENCOUNTER — Telehealth: Payer: Self-pay | Admitting: Family Medicine

## 2023-01-09 NOTE — Telephone Encounter (Signed)
Contacted [name] to schedule their annual wellness visit. Appointment made for 01/24/2023.  Thank you,  Colletta Maryland,  [place] [facility] Direct Dial ??[phone]

## 2023-01-24 DIAGNOSIS — C716 Malignant neoplasm of cerebellum: Secondary | ICD-10-CM | POA: Diagnosis not present

## 2023-01-29 ENCOUNTER — Encounter: Payer: Self-pay | Admitting: Family Medicine

## 2023-01-29 ENCOUNTER — Telehealth (INDEPENDENT_AMBULATORY_CARE_PROVIDER_SITE_OTHER): Payer: Medicare Other | Admitting: Family Medicine

## 2023-01-29 DIAGNOSIS — F323 Major depressive disorder, single episode, severe with psychotic features: Secondary | ICD-10-CM

## 2023-01-29 DIAGNOSIS — M1612 Unilateral primary osteoarthritis, left hip: Secondary | ICD-10-CM

## 2023-01-29 DIAGNOSIS — R443 Hallucinations, unspecified: Secondary | ICD-10-CM

## 2023-01-29 DIAGNOSIS — F419 Anxiety disorder, unspecified: Secondary | ICD-10-CM | POA: Diagnosis not present

## 2023-01-29 MED ORDER — CELECOXIB 50 MG PO CAPS: 50.0000 mg | ORAL_CAPSULE | Freq: Two times a day (BID) | ORAL | 1 refills | Status: DC | PRN

## 2023-01-29 MED ORDER — BUSPIRONE HCL 10 MG PO TABS: 10.0000 mg | ORAL_TABLET | Freq: Three times a day (TID) | ORAL | 1 refills | Status: DC

## 2023-01-29 MED ORDER — QUETIAPINE FUMARATE 25 MG PO TABS: 25.0000 mg | ORAL_TABLET | Freq: Every day | ORAL | 0 refills | Status: DC

## 2023-01-29 NOTE — Progress Notes (Signed)
MyChart Video visit  Subjective: CC: hallucinations PCP: [name], DO [id number] James Hoffman is a 27 y.o. male. Patient provides verbal consent for consult held via video.  Due to COVID-19 pandemic this visit was conducted virtually. This visit type was conducted due to national recommendations for restrictions regarding the COVID-19 Pandemic (e.g. social distancing, sheltering in place) in an effort to limit this patient's exposure and mitigate transmission in our community. All issues noted in this document were discussed and addressed.  A physical exam was not performed with this format.   Location of patient: home Location of provider: [facility] Others present for call: mom  1. Hallucinations Patient is having hallucinations.  He saw neurology last week and they recommended follow up with PCP.  Apparently she notes that they felt it to be radiation and chemotherapy induced brain injury but did not have any additional treatment plan for this issue.  She notes that he will randomly be talking towards the television and walls.  Sometimes he becomes combative with the hallucinations that he hears and sees.  She reports easy irritability and anger outbursts.  He is currently treated with BuSpar up to 3 times daily and Lexapro 10 mg daily   ROS: Per HPI  Allergies  Allergen Reactions   Tramadol Anaphylaxis   Morphine Nausea And Vomiting    According to mother the patient is intolerant of morphine; causes nausea and vomiting.  Has had none since he was 27yo.   Past Medical History:  Diagnosis Date   Adrenal insufficiency ([place])    Cancer ([place])    brain tumor on brain stem   Hydrocephalus ([place])    Osteoporosis    Thyroid disease     Current Outpatient Medications:    amoxicillin (AMOXIL) 875 MG tablet, Take 1 tablet (875 mg total) by mouth 2 (two) times daily. 1 po BID, Disp: 14 tablet, Rfl: 0   atorvastatin (LIPITOR) 20 MG tablet, Take 20 mg by mouth daily., Disp: , Rfl:     atorvastatin (LIPITOR) 40 MG tablet, TAKE 1 TABLET BY MOUTH EVERYDAY AT BEDTIME, Disp: 90 tablet, Rfl: 1   baclofen (LIORESAL) 10 MG tablet, TAKE 1 TABLET BY MOUTH 3 TIMES A DAY AS NEEDED FOR MUSCLE SPASMS -- REPLACING TIZANIDINE, Disp: 60 tablet, Rfl: 2   busPIRone (BUSPAR) 10 MG tablet, TAKE 1 TABLET (10 MG TOTAL) BY MOUTH 3 (THREE) TIMES DAILY. FOR ANXIETY, Disp: 270 tablet, Rfl: 0   cetirizine (ZYRTEC) 10 MG tablet, Take 1 tablet (10 mg total) by mouth daily., Disp: 30 tablet, Rfl: 11   Cholecalciferol 50 MCG (2000 UT) CAPS, Take by mouth., Disp: , Rfl:    clotrimazole-betamethasone (LOTRISONE) cream, APPLY 1 APPLICATION TOPICALLY 2 (TWO) TIMES DAILY. X 7-10 DAYS, Disp: 30 g, Rfl: 0   donepezil (ARICEPT) 5 MG tablet, Take 1 tablet (5 mg total) by mouth at bedtime. Further fills per Neuro, Disp: 90 tablet, Rfl: 0   escitalopram (LEXAPRO) 10 MG tablet, Take 1 tablet (10 mg total) by mouth daily., Disp: 90 tablet, Rfl: 3   FLOVENT HFA 44 MCG/ACT inhaler, INHALE 2 PUFFS INTO THE LUNGS TWICE A DAY, Disp: 10.6 each, Rfl: 11   fluticasone (FLONASE) 50 MCG/ACT nasal spray, SPRAY 2 SPRAYS INTO EACH NOSTRIL EVERY DAY, Disp: 48 mL, Rfl: 1   levETIRAcetam (KEPPRA) 750 MG tablet, Take 1,500 mg by mouth in the morning and at bedtime., Disp: , Rfl:    levothyroxine (SYNTHROID) 88 MCG tablet, Take 88 mcg by mouth daily  before breakfast., Disp: , Rfl:    omeprazole (PRILOSEC) 20 MG capsule, TAKE 1 CAPSULE (20 MG TOTAL) BY MOUTH DAILY. AT LUNCH TIME, Disp: 90 capsule, Rfl: 3   potassium chloride SA (KLOR-CON M) 20 MEQ tablet, Take 1 tablet (20 mEq total) by mouth 2 (two) times daily., Disp: 30 tablet, Rfl: 2   prednisoLONE 5 MG TABS tablet, Take by mouth., Disp: , Rfl:    Spacer/Aero-Holding Chambers (AEROCHAMBER PLUS) inhaler, Use as instructed, Disp: 1 each, Rfl: 2   Testosterone 20.25 MG/ACT (1.62%) GEL, ONE PUM ON EACH SHOULDER EVERY MORNIG, Disp: 75 g, Rfl: 1   VENTOLIN HFA 108 (90 Base) MCG/ACT inhaler,  TAKE 2 PUFFS BY MOUTH EVERY 6 HOURS AS NEEDED FOR WHEEZE OR SHORTNESS OF BREATH, Disp: 18 each, Rfl: 2  Gen: actively talking to the wall, NAD MSK: seated in bed  Assessment/ Plan: 27 y.o. male   Hallucination - Plan: QUEtiapine (SEROQUEL) 25 MG tablet, Ambulatory referral to Psychiatry  Anxiety - Plan: QUEtiapine (SEROQUEL) 25 MG tablet, Ambulatory referral to Psychiatry, busPIRone (BUSPAR) 10 MG tablet  Major depressive disorder, single episode with psychotic features ([place]) - Plan: QUEtiapine (SEROQUEL) 25 MG tablet, Ambulatory referral to Psychiatry  Arthritis of left hip - Plan: celecoxib (CELEBREX) 50 MG capsule  Unsure if this is secondary to progression of cognitive impairment related to brain injury in the setting of previous brain cancer treatment.  I have referred him to psychiatry to see if perhaps they can help with this very complex patient.  In the meantime I am going to have him start tapering from the Lexapro and onto Seroquel.  BuSpar continued and renewed  Okay to resume Celebrex as needed.  Start time: 10:15am (first text sent); 10:18am (text 2 sent); 10:20am (3rd text sent) End time: 10:32am  Total time spent on patient care (including video visit/ documentation): 12 minutes  [place], [place] [phone]

## 2023-01-30 ENCOUNTER — Telehealth: Payer: Self-pay | Admitting: Family Medicine

## 2023-01-30 NOTE — Telephone Encounter (Signed)
Pt has been notified.

## 2023-02-12 ENCOUNTER — Telehealth: Payer: Self-pay | Admitting: Family Medicine

## 2023-02-12 NOTE — Telephone Encounter (Signed)
Contacted [name] to schedule their annual wellness visit. Appointment made for 02/20/2023.  Thank you,  Colletta [place],  [facility] Direct Dial ??[phone]

## 2023-02-15 ENCOUNTER — Other Ambulatory Visit: Payer: Self-pay | Admitting: Family Medicine

## 2023-02-15 DIAGNOSIS — M25562 Pain in left knee: Secondary | ICD-10-CM

## 2023-02-15 DIAGNOSIS — Z7409 Other reduced mobility: Secondary | ICD-10-CM

## 2023-02-16 NOTE — Telephone Encounter (Signed)
Pt scheduled with PCP on 6/10 for med refill. That is currently first available.

## 2023-02-20 ENCOUNTER — Other Ambulatory Visit: Payer: Self-pay | Admitting: Family Medicine

## 2023-02-20 ENCOUNTER — Ambulatory Visit (INDEPENDENT_AMBULATORY_CARE_PROVIDER_SITE_OTHER): Payer: Medicare Other

## 2023-02-20 VITALS — Ht 71.0 in | Wt 130.0 lb

## 2023-02-20 DIAGNOSIS — Z0000 Encounter for general adult medical examination without abnormal findings: Secondary | ICD-10-CM

## 2023-02-20 DIAGNOSIS — R062 Wheezing: Secondary | ICD-10-CM

## 2023-02-20 DIAGNOSIS — J9801 Acute bronchospasm: Secondary | ICD-10-CM

## 2023-02-20 DIAGNOSIS — B9689 Other specified bacterial agents as the cause of diseases classified elsewhere: Secondary | ICD-10-CM

## 2023-02-20 NOTE — Progress Notes (Signed)
Subjective:   James Hoffman is a 27 y.o. male who presents for Medicare Annual/Subsequent preventive examination. I connected with  James Hoffman on 02/20/23 by a audio enabled telemedicine application and verified that I am speaking with the correct person using two identifiers.  Patient Location: Home  Provider Location: Home Office  I discussed the limitations of evaluation and management by telemedicine. The patient expressed understanding and agreed to proceed.  Review of Systems     Cardiac Risk Factors include: male gender     Objective:    Today's Vitals   02/20/23 1354  Weight: 130 lb (59 kg)  Height: 5\' 11"  (1.803 m)   Body mass index is 18.13 kg/m.     02/20/2023    1:57 PM 06/14/2021   11:26 AM 02/24/2020   11:54 AM 12/06/2017    5:13 PM 10/28/2017    1:19 AM 06/25/2017    4:46 PM 12/26/2016    8:55 PM  Advanced Directives  Does Patient Have a Medical Advance Directive? No No No No No No No  Would patient like information on creating a medical advance directive? No - Patient declined No - Guardian declined No - Patient declined  No - Patient declined      Current Medications (verified) Outpatient Encounter Medications as of 02/20/2023  Medication Sig   atorvastatin (LIPITOR) 40 MG tablet TAKE 1 TABLET BY MOUTH EVERYDAY AT BEDTIME   baclofen (LIORESAL) 10 MG tablet TAKE 1 TABLET BY MOUTH 3 TIMES A DAY AS NEEDED FOR MUSCLE SPASMS -- REPLACING TIZANIDINE   busPIRone (BUSPAR) 10 MG tablet Take 1 tablet (10 mg total) by mouth 3 (three) times daily. For Anxiety   celecoxib (CELEBREX) 50 MG capsule Take 1 capsule (50 mg total) by mouth 2 (two) times daily as needed for pain.   cetirizine (ZYRTEC) 10 MG tablet Take 1 tablet (10 mg total) by mouth daily.   Cholecalciferol 50 MCG (2000 UT) CAPS Take by mouth.   clotrimazole-betamethasone (LOTRISONE) cream APPLY 1 APPLICATION TOPICALLY 2 (TWO) TIMES DAILY. X 7-10 DAYS   FLOVENT HFA 44 MCG/ACT inhaler INHALE 2  PUFFS INTO THE LUNGS TWICE A DAY   fluticasone (FLONASE) 50 MCG/ACT nasal spray SPRAY 2 SPRAYS INTO EACH NOSTRIL EVERY DAY   levETIRAcetam (KEPPRA) 750 MG tablet Take 1,500 mg by mouth in the morning and at bedtime.   levothyroxine (SYNTHROID) 88 MCG tablet Take 88 mcg by mouth daily before breakfast.   omeprazole (PRILOSEC) 20 MG capsule TAKE 1 CAPSULE (20 MG TOTAL) BY MOUTH DAILY. AT LUNCH TIME   potassium chloride SA (KLOR-CON M) 20 MEQ tablet Take 1 tablet (20 mEq total) by mouth 2 (two) times daily.   prednisoLONE 5 MG TABS tablet Take by mouth.   QUEtiapine (SEROQUEL) 25 MG tablet Take 1 tablet (25 mg total) by mouth at bedtime.   Spacer/Aero-Holding Chambers (AEROCHAMBER PLUS) inhaler Use as instructed   Testosterone 20.25 MG/ACT (1.62%) GEL ONE PUM ON EACH SHOULDER EVERY MORNIG   VENTOLIN HFA 108 (90 Base) MCG/ACT inhaler TAKE 2 PUFFS BY MOUTH EVERY 6 HOURS AS NEEDED FOR WHEEZE OR SHORTNESS OF BREATH   No facility-administered encounter medications on file as of 02/20/2023.    Allergies (verified) Tramadol and Morphine   History: Past Medical History:  Diagnosis Date   Adrenal insufficiency    Cancer    brain tumor on brain stem   Hydrocephalus    Osteoporosis    Thyroid disease    Past Surgical  History:  Procedure Laterality Date   brain tumor removal  August 2012   EYE SURGERY Right 2014   cataract removed   HIP SURGERY     teeth removal     History reviewed. No pertinent family history. Social History   Socioeconomic History   Marital status: Single    Spouse name: Not on file   Number of children: 0   Years of education: 7th grade   Highest education level: 7th grade  Occupational History   Occupation: disabled  Tobacco Use   Smoking status: Every Day    Packs/day: 0.25    Years: 6.00    Additional pack years: 0.00    Total pack years: 1.50    Types: Cigarettes    Start date: 11/25/2012   Smokeless tobacco: Never  Vaping Use   Vaping Use: Never used   Substance and Sexual Activity   Alcohol use: No   Drug use: No   Sexual activity: Not Currently  Other Topics Concern   Not on file  Social History Narrative   Lives with his grandmother   Mostly wheelchair bound   Sometimes he uses walker when knees aren't hurt   Mostly dependent on others for assistance - eats on his own, but can't prepare meals, has to have help in and out of shower, but bathes himself, etc.   Social Determinants of Health   Financial Resource Strain: Low Risk  (02/20/2023)   Overall Financial Resource Strain (CARDIA)    Difficulty of Paying Living Expenses: Not hard at all  Food Insecurity: No Food Insecurity (02/20/2023)   Hunger Vital Sign    Worried About Running Out of Food in the Last Year: Never true    Ran Out of Food in the Last Year: Never true  Transportation Needs: No Transportation Needs (02/20/2023)   PRAPARE - Administrator, Civil Service (Medical): No    Lack of Transportation (Non-Medical): No  Physical Activity: Inactive (02/20/2023)   Exercise Vital Sign    Days of Exercise per Week: 0 days    Minutes of Exercise per Session: 0 min  Stress: No Stress Concern Present (02/20/2023)   [employer] of Occupational Health - Occupational Stress Questionnaire    Feeling of Stress : Not at all  Social Connections: Socially Isolated (02/20/2023)   Social Connection and Isolation Panel [NHANES]    Frequency of Communication with Friends and Family: More than three times a week    Frequency of Social Gatherings with Friends and Family: More than three times a week    Attends Religious Services: Never    Database administrator or Organizations: No    Attends Engineer, structural: Never    Marital Status: Never married    Tobacco Counseling Ready to quit: No Counseling given: Not Answered   Clinical Intake:  Pre-visit preparation completed: Yes  Pain : No/denies pain     Nutritional Risks: None Diabetes: No  How often  do you need to have someone help you when you read instructions, pamphlets, or other written materials from your doctor or pharmacy?: 1 - Never  Diabetic?no   Interpreter Needed?: No  Information entered by :: Renie Ora, LPN   Activities of Daily Living    02/20/2023    1:58 PM  In your present state of health, do you have any difficulty performing the following activities:  Hearing? 1  Vision? 1  Difficulty concentrating or making decisions? 1  Walking or  climbing stairs? 1  Dressing or bathing? 1  Doing errands, shopping? 1  Preparing Food and eating ? Y  Using the Toilet? Y  In the past six months, have you accidently leaked urine? Y  Do you have problems with loss of bowel control? Y  Managing your Medications? Y  Managing your Finances? Y  Housekeeping or managing your Housekeeping? Y    Patient Care Team: Raliegh Ip, DO as PCP - General (Family Medicine) Randa Spike, [name], LCSW as Social Worker (Licensed Clinical Social Worker)  Indicate any recent [employer] you may have received from other than [facility] providers in the past year (date may be approximate).     Assessment:   This is a routine wellness examination for [place].  Hearing/Vision screen Vision Screening - Comments:: Has referral to [facility] ophthalmology to be evaluated   Dietary issues and exercise activities discussed: Current Exercise Habits: The patient does not participate in regular exercise at present, Exercise limited by: neurologic condition(s)   Goals Addressed             This Visit's Progress    DIET - INCREASE WATER INTAKE         Depression Screen    02/20/2023    1:57 PM 09/11/2022    3:20 PM 12/27/2021    3:38 PM 11/29/2021    4:59 PM 10/31/2021    2:24 PM 08/29/2021    2:27 PM 06/30/2021    4:15 PM  PHQ 2/9 Scores  PHQ - 2 Score 0 2 2 2 4 4 4   PHQ- 9 Score  9 8 11 13 16 16     Fall Risk    02/20/2023    1:56 PM 09/11/2022    3:20 PM 12/16/2021   10:12 AM  11/29/2021    4:57 PM 08/29/2021    2:27 PM  Fall Risk   Falls in the past year? 0 0 0 0 0  Number falls in past yr: 0   0   Injury with Fall? 0   0   Risk for fall due to : No Fall Risks   Impaired mobility;Orthopedic patient   Follow up Falls prevention discussed   Education provided;Falls prevention discussed     FALL RISK PREVENTION PERTAINING TO THE HOME:  Any stairs in or around the home? No  If so, are there any without handrails? No  Home free of loose throw rugs in walkways, pet beds, electrical cords, etc? Yes  Adequate lighting in your home to reduce risk of falls? Yes   ASSISTIVE DEVICES UTILIZED TO PREVENT FALLS:  Life alert? No  Use of a cane, walker or w/c? Yes  Grab bars in the bathroom? Yes  Shower chair or bench in shower? Yes  Elevated toilet seat or a handicapped toilet? Yes       11/29/2021    4:35 PM 02/24/2020   12:16 PM  MMSE - Mini Mental State Exam  Not completed: Unable to complete Unable to complete        02/20/2023    1:58 PM  6CIT Screen  What Year? 0 points  What month? 0 points  What time? 0 points  Count back from 20 0 points  Months in reverse 0 points  Repeat phrase 0 points  Total Score 0 points    Immunizations Immunization History  Administered Date(s) Administered   DTP 05/15/1996, 07/17/1996, 10/21/1996   DTaP 05/15/1996, 07/17/1996, 10/21/1996, 09/13/1998, 05/09/2001   HIB (PRP-OMP) 05/21/1996, 07/17/1996, 10/21/1996,  09/13/1998   HIB (PRP-T) 05/21/1996, 07/17/1996, 10/21/1996, 09/13/1998   Hepatitis B 05/15/1996, 07/17/1996, 10/21/1996   Hepatitis B, PED/ADOLESCENT 05/15/1996, 07/17/1996, 10/21/1996   IPV 05/15/1996, 07/17/1996, 10/21/1996, 05/09/2001   Influenza,inj,Quad PF,6+ Mos 10/29/2019, 08/17/2021   Influenza-Unspecified 08/15/2010, 10/29/2019   MMR 09/13/1998, 05/09/2001   Moderna Sars-Covid-2 Vaccination 02/17/2020, 03/16/2020, 11/23/2020   OPV 05/15/1996, 07/17/1996, 10/21/1996   Pneumococcal  Polysaccharide-23 05/18/2021   Tdap 07/06/2008    TDAP status: Up to date  Flu Vaccine status: Up to date  Pneumococcal vaccine status: Declined,  Education has been provided regarding the importance of this vaccine but patient still declined. Advised may receive this vaccine at local pharmacy or Health Dept. Aware to provide a copy of the vaccination record if obtained from local pharmacy or Health Dept. Verbalized acceptance and understanding.   Covid-19 vaccine status: Completed vaccines  Qualifies for Shingles Vaccine? No   Zostavax completed No   Shingrix Completed?: No.    Education has been provided regarding the importance of this vaccine. Patient has been advised to call insurance company to determine out of pocket expense if they have not yet received this vaccine. Advised may also receive vaccine at local pharmacy or Health Dept. Verbalized acceptance and understanding.  Screening Tests Health Maintenance  Topic Date Due   DTaP/Tdap/Td (7 - Td or Tdap) 07/06/2018   COVID-19 Vaccine (4 - 2023-24 season) 07/14/2022   Hepatitis C Screening  03/15/2023 (Originally 02/21/2014)   HPV VACCINES (1 - Risk male 3-dose series) 09/12/2023 (Originally 02/22/2007)   INFLUENZA VACCINE  06/14/2023   Medicare Annual Wellness (AWV)  02/20/2024   HIV Screening  Completed    Health Maintenance  Health Maintenance Due  Topic Date Due   DTaP/Tdap/Td (7 - Td or Tdap) 07/06/2018   COVID-19 Vaccine (4 - 2023-24 season) 07/14/2022    Colorectal cancer screening: No longer required.   Lung Cancer Screening: (Low Dose CT Chest recommended if Age 27-80 years, 30 pack-year currently smoking OR have quit w/in 15years.) does not qualify.   Lung Cancer Screening Referral: n/a  Additional Screening:  Hepatitis C Screening: does not qualify;   Vision Screening: Recommended annual ophthalmology exams for early detection of glaucoma and other disorders of the eye. Is the patient up to date with  their annual eye exam?  No  Who is the provider or what is the name of the office in which the patient attends annual eye exams? Per mother referral to [facility] If pt is not established with a provider, would they like to be referred to a provider to establish care? No .   Dental Screening: Recommended annual dental exams for proper oral hygiene  Community Resource Referral / Chronic Care Management: CRR required this visit?  No   CCM required this visit?  No      Plan:     I have personally reviewed and noted the following in the patient's chart:   Medical and social history Use of alcohol, tobacco or illicit drugs  Current medications and supplements including opioid prescriptions. Patient is not currently taking opioid prescriptions. Functional ability and status Nutritional status Physical activity Advanced directives List of other physicians Hospitalizations, surgeries, and ER visits in previous 12 months Vitals Screenings to include cognitive, depression, and falls Referrals and appointments  In addition, I have reviewed and discussed with patient certain preventive protocols, quality metrics, and best practice recommendations. A written personalized care plan for preventive services as well as general preventive health recommendations were provided to  patient.     Lorrene Reid, LPN   04/15/9527   Nurse Notes: Mother Brien Few helped with MWV

## 2023-02-20 NOTE — Patient Instructions (Signed)
James Hoffman , Thank you for taking time to come for your Medicare Wellness Visit. I appreciate your ongoing commitment to your health goals. Please review the following plan we discussed and let me know if I can assist you in the future.   These are the goals we discussed:  Goals       Client or family representative to talk with LCSW in next 30 days to discuss client daily needs, ADLs neds, and needs regarding wheelchair ramp (pt-stated)      CARE PLAN ENTRY   Current Barriers:  Wheelchair bound client with Chronic Diagnoses of Medulloblastoma, Osteoporosis, , Hypothyroidism, Hx brain tumor, medullary carcinoma Hearing deficits Communication challenges  Clinical Social Work Clinical Goal(s):  Client/family representative to talk with LCSW in next 30 days to discuss client daily needs, ADL needs and needs regarding wheelchair ramp  Interventions: Talked with Johnn Hai, aunt about client current needs Talked with [name] about client needs related to wheelchair ramp Provided counseling support for Toni Amend, aunt and caregiver for client Talked with Johnn Hai about Guardianship information for client Talked with Toni Amend about ADTS contact, Etheleen Nicks, related to ramp construction information Talked with Toni Amend about ambulation needs of client Talked with Toni Amend about weight issues of client and about appetite of client Talked with Toni Amend about vision challenges of client Talked with Toni Amend about RNCM support with CCM program Talked with Toni Amend about [employer] as possible resource for client Talked with Toni Amend about client relaxation techniques (listens to music, watches favorite TV shows, enjoys spending time with family members) Talked with Toni Amend about DME equipment of client (has wheelchair, Administrator)   Patient Self Care Activities:  Feeds self with set up  Self Care Deficits Mobility deficits Wheelchair  dependent    Initial goal documentation       DIET - INCREASE WATER INTAKE      Exercise 3x per week (30 min per time)      Manage My Emotions; Manage Depression issues faced      Timeframe:  Short-Term Goal Priority:  Medium Progress: On Track Start Date:        12/27/21                Expected End Date:       03/20/22            Follow Up Date:  02/23/22 at 2:00 PM   Manage Emotions. Manage Depression issues faced    Why is this important?   When you are stressed, down or upset, your body reacts too.  For example, your blood pressure may get higher; you may have a headache or stomachache.  When your emotions get the best of you, your body's ability to fight off cold and flu gets weak.  These steps will help you manage your emotions.     Patient Self Care Activities:  Attends scheduled medical appointments Has strong family support  Patient Coping Strengths:  Supportive Relationships Family  Patient Self Care Deficits:  Mobility issues Transport needs  Patient Goals:  - spend time or talk with others at least 2 to 3 times per week - practice relaxation or meditation daily - keep a calendar with appointment dates  Follow Up Plan: LCSW to call client or Johnn Hai, aunt of client, on 02/23/22 at 2:00 PM to assess needs of client       Quit Smoking        This is a list of the screening  recommended for you and due dates:  Health Maintenance  Topic Date Due   DTaP/Tdap/Td vaccine (7 - Td or Tdap) 07/06/2018   COVID-19 Vaccine (4 - 2023-24 season) 07/14/2022   Hepatitis C Screening: USPSTF Recommendation to screen - Ages 18-79 yo.  03/15/2023*   HPV Vaccine (1 - Risk male 3-dose series) 09/12/2023*   Flu Shot  06/14/2023   Medicare Annual Wellness Visit  02/20/2024   HIV Screening  Completed  *Topic was postponed. The date shown is not the original due date.    Advanced directives: Advance directive discussed with you today. I have provided a copy for you  to complete at home and have notarized. Once this is complete please bring a copy in to our office so we can scan it into your chart.   Conditions/risks identified: Aim for 30 minutes of exercise, 6-8 glasses of water, and 5 servings of fruits and vegetables each day.   Next appointment: Follow up in one year for your annual wellness visit   Preventive Care 84-4 Years Old, Male Preventive care refers to lifestyle choices and visits with your health care provider that can promote health and wellness. Preventive care visits are also called wellness exams. What can I expect for my preventive care visit? Counseling During your preventive care visit, your health care provider may ask about your: Medical history, including: Past medical problems. Family medical history. Current health, including: Emotional well-being. Home life and relationship well-being. Sexual activity. Lifestyle, including: Alcohol, nicotine or tobacco, and drug use. Access to firearms. Diet, exercise, and sleep habits. Safety issues such as seatbelt and bike helmet use. Sunscreen use. Work and work Astronomer. Physical exam Your health care provider may check your: Height and weight. These may be used to calculate your BMI (body mass index). BMI is a measurement that tells if you are at a healthy weight. Waist circumference. This measures the distance around your waistline. This measurement also tells if you are at a healthy weight and may help predict your risk of certain diseases, such as type 2 diabetes and high blood pressure. Heart rate and blood pressure. Body temperature. Skin for abnormal spots. What immunizations do I need? Vaccines are usually given at various ages, according to a schedule. Your health care provider will recommend vaccines for you based on your age, medical history, and lifestyle or other factors, such as travel or where you work. What tests do I need? Screening Your health care  provider may recommend screening tests for certain conditions. This may include: Lipid and cholesterol levels. Diabetes screening. This is done by checking your blood sugar (glucose) after you have not eaten for a while (fasting). Hepatitis B test. Hepatitis C test. HIV (human immunodeficiency virus) test. STI (sexually transmitted infection) testing, if you are at risk. Talk with your health care provider about your test results, treatment options, and if necessary, the need for more tests. Follow these instructions at home: Eating and drinking  Eat a healthy diet that includes fresh fruits and vegetables, whole grains, lean protein, and low-fat dairy products. Drink enough fluid to keep your urine pale yellow. Take vitamin and mineral supplements as recommended by your health care provider. Do not drink alcohol if your health care provider tells you not to drink. If you drink alcohol: Limit how much you have to 0-2 drinks a day. Know how much alcohol is in your drink. In the [place] one drink equals one 12 oz bottle of beer (355 mL), one 5 oz  glass of wine (148 mL), or one 1 oz glass of hard liquor (44 mL). Lifestyle Brush your teeth every morning and night with fluoride toothpaste. Floss one time each day. Exercise for at least 30 minutes 5 or more days each week. Do not use any products that contain nicotine or tobacco. These products include cigarettes, chewing tobacco, and vaping devices, such as e-cigarettes. If you need help quitting, ask your health care provider. Do not use drugs. If you are sexually active, practice safe sex. Use a condom or other form of protection to prevent STIs. Find healthy ways to manage stress, such as: Meditation, yoga, or listening to music. Journaling. Talking to a trusted person. Spending time with friends and family. Minimize exposure to UV radiation to reduce your risk of skin cancer. Safety Always wear your seat belt while driving or riding in a  vehicle. Do not drive: If you have been drinking alcohol. Do not ride with someone who has been drinking. If you have been using any mind-altering substances or drugs. While texting. When you are tired or distracted. Wear a helmet and other protective equipment during sports activities. If you have firearms in your house, make sure you follow all gun safety procedures. Seek help if you have been physically or sexually abused. What's next? Go to your health care provider once a year for an annual wellness visit. Ask your health care provider how often you should have your eyes and teeth checked. Stay up to date on all vaccines. This information is not intended to replace advice given to you by your health care provider. Make sure you discuss any questions you have with your health care provider. Document Revised: 04/27/2021 Document Reviewed: 04/27/2021 Elsevier Patient Education  2022 [employer]

## 2023-02-20 NOTE — Telephone Encounter (Signed)
  ARNUITY ELLIPTA 100 MCG/ACT AEPB        Changed from: FLOVENT HFA 44 MCG/ACT inhaler   Pharmacy comment: Alternative Requested:NOT COVERED.

## 2023-03-18 ENCOUNTER — Other Ambulatory Visit: Payer: Self-pay | Admitting: Family Medicine

## 2023-03-20 ENCOUNTER — Other Ambulatory Visit: Payer: Self-pay | Admitting: Family Medicine

## 2023-03-20 DIAGNOSIS — R062 Wheezing: Secondary | ICD-10-CM

## 2023-03-20 DIAGNOSIS — J9801 Acute bronchospasm: Secondary | ICD-10-CM

## 2023-03-31 ENCOUNTER — Other Ambulatory Visit: Payer: Self-pay | Admitting: Family Medicine

## 2023-03-31 DIAGNOSIS — M25462 Effusion, left knee: Secondary | ICD-10-CM

## 2023-03-31 DIAGNOSIS — Z7409 Other reduced mobility: Secondary | ICD-10-CM

## 2023-04-02 NOTE — Telephone Encounter (Signed)
Last office visit 01/29/23 Last refill 02/16/23, #60 no refills

## 2023-04-10 ENCOUNTER — Telehealth: Payer: Self-pay | Admitting: Family Medicine

## 2023-04-10 DIAGNOSIS — R4182 Altered mental status, unspecified: Secondary | ICD-10-CM | POA: Diagnosis not present

## 2023-04-10 DIAGNOSIS — R531 Weakness: Secondary | ICD-10-CM | POA: Diagnosis not present

## 2023-04-10 DIAGNOSIS — D72829 Elevated white blood cell count, unspecified: Secondary | ICD-10-CM | POA: Diagnosis not present

## 2023-04-10 DIAGNOSIS — R7989 Other specified abnormal findings of blood chemistry: Secondary | ICD-10-CM | POA: Diagnosis not present

## 2023-04-10 DIAGNOSIS — R569 Unspecified convulsions: Secondary | ICD-10-CM | POA: Diagnosis not present

## 2023-04-10 DIAGNOSIS — Z049 Encounter for examination and observation for unspecified reason: Secondary | ICD-10-CM | POA: Diagnosis not present

## 2023-04-10 DIAGNOSIS — R0689 Other abnormalities of breathing: Secondary | ICD-10-CM | POA: Diagnosis not present

## 2023-04-10 NOTE — Telephone Encounter (Signed)
LEFT VM FOR CB

## 2023-04-10 NOTE — Telephone Encounter (Signed)
He has known history of dementia/ cognitive impairment related to history of brain injury/ tumor treatment.  This may be manifestation of cognitive decline.  Recommend follow up with his neurologist.

## 2023-04-11 DIAGNOSIS — R9431 Abnormal electrocardiogram [ECG] [EKG]: Secondary | ICD-10-CM | POA: Diagnosis not present

## 2023-04-18 ENCOUNTER — Other Ambulatory Visit: Payer: Self-pay | Admitting: Family Medicine

## 2023-04-18 DIAGNOSIS — J9801 Acute bronchospasm: Secondary | ICD-10-CM

## 2023-04-18 DIAGNOSIS — R062 Wheezing: Secondary | ICD-10-CM

## 2023-04-19 ENCOUNTER — Telehealth: Payer: Self-pay | Admitting: Family Medicine

## 2023-04-19 NOTE — Telephone Encounter (Signed)
Mother aware

## 2023-04-19 NOTE — Telephone Encounter (Signed)
Says patient was seen in the ER last week sometimes for stroke like symptoms. Was checked out and sent home. Says pt still has some numbness in his leg. Wants to know if she should take pt back to the ER?

## 2023-04-19 NOTE — Telephone Encounter (Signed)
yes

## 2023-04-20 DIAGNOSIS — I6389 Other cerebral infarction: Secondary | ICD-10-CM | POA: Diagnosis not present

## 2023-04-20 DIAGNOSIS — R1312 Dysphagia, oropharyngeal phase: Secondary | ICD-10-CM | POA: Diagnosis not present

## 2023-04-20 DIAGNOSIS — N189 Chronic kidney disease, unspecified: Secondary | ICD-10-CM | POA: Diagnosis not present

## 2023-04-20 DIAGNOSIS — C801 Malignant (primary) neoplasm, unspecified: Secondary | ICD-10-CM | POA: Diagnosis not present

## 2023-04-20 DIAGNOSIS — Z9221 Personal history of antineoplastic chemotherapy: Secondary | ICD-10-CM | POA: Diagnosis not present

## 2023-04-20 DIAGNOSIS — I639 Cerebral infarction, unspecified: Secondary | ICD-10-CM | POA: Diagnosis not present

## 2023-04-20 DIAGNOSIS — R6339 Other feeding difficulties: Secondary | ICD-10-CM | POA: Diagnosis not present

## 2023-04-20 DIAGNOSIS — G459 Transient cerebral ischemic attack, unspecified: Secondary | ICD-10-CM | POA: Diagnosis not present

## 2023-04-20 DIAGNOSIS — K219 Gastro-esophageal reflux disease without esophagitis: Secondary | ICD-10-CM | POA: Diagnosis not present

## 2023-04-20 DIAGNOSIS — R9431 Abnormal electrocardiogram [ECG] [EKG]: Secondary | ICD-10-CM | POA: Diagnosis not present

## 2023-04-20 DIAGNOSIS — G40909 Epilepsy, unspecified, not intractable, without status epilepticus: Secondary | ICD-10-CM | POA: Diagnosis not present

## 2023-04-20 DIAGNOSIS — E569 Vitamin deficiency, unspecified: Secondary | ICD-10-CM | POA: Diagnosis not present

## 2023-04-20 DIAGNOSIS — F17210 Nicotine dependence, cigarettes, uncomplicated: Secondary | ICD-10-CM | POA: Diagnosis not present

## 2023-04-20 DIAGNOSIS — Z79899 Other long term (current) drug therapy: Secondary | ICD-10-CM | POA: Diagnosis not present

## 2023-04-20 DIAGNOSIS — Z7952 Long term (current) use of systemic steroids: Secondary | ICD-10-CM | POA: Diagnosis not present

## 2023-04-20 DIAGNOSIS — I63219 Cerebral infarction due to unspecified occlusion or stenosis of unspecified vertebral arteries: Secondary | ICD-10-CM | POA: Diagnosis not present

## 2023-04-20 DIAGNOSIS — Z982 Presence of cerebrospinal fluid drainage device: Secondary | ICD-10-CM | POA: Diagnosis not present

## 2023-04-20 DIAGNOSIS — E291 Testicular hypofunction: Secondary | ICD-10-CM | POA: Diagnosis not present

## 2023-04-20 DIAGNOSIS — E2740 Unspecified adrenocortical insufficiency: Secondary | ICD-10-CM | POA: Diagnosis not present

## 2023-04-20 DIAGNOSIS — R531 Weakness: Secondary | ICD-10-CM | POA: Diagnosis not present

## 2023-04-20 DIAGNOSIS — R29720 NIHSS score 20: Secondary | ICD-10-CM | POA: Diagnosis not present

## 2023-04-20 DIAGNOSIS — E039 Hypothyroidism, unspecified: Secondary | ICD-10-CM | POA: Diagnosis not present

## 2023-04-20 DIAGNOSIS — F79 Unspecified intellectual disabilities: Secondary | ICD-10-CM | POA: Diagnosis not present

## 2023-04-21 ENCOUNTER — Other Ambulatory Visit: Payer: Self-pay | Admitting: Family

## 2023-04-21 ENCOUNTER — Other Ambulatory Visit: Payer: Self-pay | Admitting: Family Medicine

## 2023-04-21 DIAGNOSIS — R9431 Abnormal electrocardiogram [ECG] [EKG]: Secondary | ICD-10-CM | POA: Diagnosis not present

## 2023-04-21 DIAGNOSIS — I639 Cerebral infarction, unspecified: Secondary | ICD-10-CM | POA: Diagnosis not present

## 2023-04-22 DIAGNOSIS — I639 Cerebral infarction, unspecified: Secondary | ICD-10-CM | POA: Diagnosis not present

## 2023-04-23 ENCOUNTER — Ambulatory Visit: Payer: Medicare Other | Admitting: Family Medicine

## 2023-04-23 ENCOUNTER — Encounter: Payer: Self-pay | Admitting: Family Medicine

## 2023-04-23 DIAGNOSIS — I639 Cerebral infarction, unspecified: Secondary | ICD-10-CM | POA: Diagnosis not present

## 2023-04-23 DIAGNOSIS — G459 Transient cerebral ischemic attack, unspecified: Secondary | ICD-10-CM | POA: Diagnosis not present

## 2023-04-23 DIAGNOSIS — C801 Malignant (primary) neoplasm, unspecified: Secondary | ICD-10-CM | POA: Diagnosis not present

## 2023-04-23 DIAGNOSIS — R531 Weakness: Secondary | ICD-10-CM | POA: Diagnosis not present

## 2023-04-25 ENCOUNTER — Telehealth: Payer: Self-pay

## 2023-04-25 NOTE — Transitions of Care (Post Inpatient/ED Visit) (Signed)
   04/25/2023  Name: James Hoffman MRN: [id number] DOB: February 05, 1996  Today's TOC FU Call Status: Today's TOC FU Call Status:: Unsuccessul Call (1st Attempt) Unsuccessful Call (1st Attempt) Date: 04/25/23  Attempted to reach the patient regarding the most recent Inpatient/ED visit.  Follow Up Plan: Additional outreach attempts will be made to reach the patient to complete the Transitions of Care (Post Inpatient/ED visit) call.   Jodelle Gross, RN, BSN, CCM Care Management Coordinator [place] Phone: [phone]/Fax: [phone]

## 2023-04-26 ENCOUNTER — Telehealth: Payer: Self-pay

## 2023-04-26 DIAGNOSIS — E2740 Unspecified adrenocortical insufficiency: Secondary | ICD-10-CM | POA: Diagnosis not present

## 2023-04-26 DIAGNOSIS — E038 Other specified hypothyroidism: Secondary | ICD-10-CM | POA: Diagnosis not present

## 2023-04-26 DIAGNOSIS — F411 Generalized anxiety disorder: Secondary | ICD-10-CM | POA: Diagnosis not present

## 2023-04-26 DIAGNOSIS — M810 Age-related osteoporosis without current pathological fracture: Secondary | ICD-10-CM | POA: Diagnosis not present

## 2023-04-26 DIAGNOSIS — G9389 Other specified disorders of brain: Secondary | ICD-10-CM | POA: Diagnosis not present

## 2023-04-26 DIAGNOSIS — G40909 Epilepsy, unspecified, not intractable, without status epilepticus: Secondary | ICD-10-CM | POA: Diagnosis not present

## 2023-04-26 DIAGNOSIS — Z7952 Long term (current) use of systemic steroids: Secondary | ICD-10-CM | POA: Diagnosis not present

## 2023-04-26 DIAGNOSIS — I69354 Hemiplegia and hemiparesis following cerebral infarction affecting left non-dominant side: Secondary | ICD-10-CM | POA: Diagnosis not present

## 2023-04-26 DIAGNOSIS — Z604 Social exclusion and rejection: Secondary | ICD-10-CM | POA: Diagnosis not present

## 2023-04-26 DIAGNOSIS — H9190 Unspecified hearing loss, unspecified ear: Secondary | ICD-10-CM | POA: Diagnosis not present

## 2023-04-26 DIAGNOSIS — Z7951 Long term (current) use of inhaled steroids: Secondary | ICD-10-CM | POA: Diagnosis not present

## 2023-04-26 DIAGNOSIS — E291 Testicular hypofunction: Secondary | ICD-10-CM | POA: Diagnosis not present

## 2023-04-26 DIAGNOSIS — Z7982 Long term (current) use of aspirin: Secondary | ICD-10-CM | POA: Diagnosis not present

## 2023-04-26 DIAGNOSIS — H269 Unspecified cataract: Secondary | ICD-10-CM | POA: Diagnosis not present

## 2023-04-26 DIAGNOSIS — E559 Vitamin D deficiency, unspecified: Secondary | ICD-10-CM | POA: Diagnosis not present

## 2023-04-26 DIAGNOSIS — Z7901 Long term (current) use of anticoagulants: Secondary | ICD-10-CM | POA: Diagnosis not present

## 2023-04-26 DIAGNOSIS — Z85841 Personal history of malignant neoplasm of brain: Secondary | ICD-10-CM | POA: Diagnosis not present

## 2023-04-26 DIAGNOSIS — K219 Gastro-esophageal reflux disease without esophagitis: Secondary | ICD-10-CM | POA: Diagnosis not present

## 2023-04-26 DIAGNOSIS — H5316 Psychophysical visual disturbances: Secondary | ICD-10-CM | POA: Diagnosis not present

## 2023-04-26 DIAGNOSIS — Z79899 Other long term (current) drug therapy: Secondary | ICD-10-CM | POA: Diagnosis not present

## 2023-04-26 NOTE — Transitions of Care (Post Inpatient/ED Visit) (Signed)
   04/26/2023  Name: James Hoffman MRN: [id number] DOB: Oct 22, 1996  Today's TOC FU Call Status: Today's TOC FU Call Status:: Unsuccessful Call (2nd Attempt)  Attempted to reach the patient regarding the most recent Inpatient/ED visit.  Follow Up Plan: Additional outreach attempts will be made to reach the patient to complete the Transitions of Care (Post Inpatient/ED visit) call.   Jodelle Gross, RN, BSN, CCM Care Management Coordinator [place]

## 2023-04-27 ENCOUNTER — Telehealth: Payer: Self-pay

## 2023-04-27 NOTE — Transitions of Care (Post Inpatient/ED Visit) (Signed)
   04/27/2023  Name: James Hoffman MRN: [id number] DOB: 1996-07-06  Today's TOC FU Call Status: Today's TOC FU Call Status:: Unsuccessful Call (3rd Attempt) Unsuccessful Call (3rd Attempt) Date: 04/27/23  Attempted to reach the patient regarding the most recent Inpatient/ED visit.  Follow Up Plan: No further outreach attempts will be made at this time. We have been unable to contact the patient.  Jodelle Gross, RN, BSN, CCM Care Management Coordinator [place]

## 2023-04-28 ENCOUNTER — Other Ambulatory Visit: Payer: Self-pay | Admitting: Family Medicine

## 2023-04-28 DIAGNOSIS — F419 Anxiety disorder, unspecified: Secondary | ICD-10-CM

## 2023-04-28 DIAGNOSIS — F323 Major depressive disorder, single episode, severe with psychotic features: Secondary | ICD-10-CM

## 2023-04-28 DIAGNOSIS — R443 Hallucinations, unspecified: Secondary | ICD-10-CM

## 2023-05-01 ENCOUNTER — Ambulatory Visit (INDEPENDENT_AMBULATORY_CARE_PROVIDER_SITE_OTHER): Payer: Medicare Other

## 2023-05-01 DIAGNOSIS — M810 Age-related osteoporosis without current pathological fracture: Secondary | ICD-10-CM

## 2023-05-01 DIAGNOSIS — E038 Other specified hypothyroidism: Secondary | ICD-10-CM | POA: Diagnosis not present

## 2023-05-01 DIAGNOSIS — E559 Vitamin D deficiency, unspecified: Secondary | ICD-10-CM | POA: Diagnosis not present

## 2023-05-01 DIAGNOSIS — H5316 Psychophysical visual disturbances: Secondary | ICD-10-CM | POA: Diagnosis not present

## 2023-05-01 DIAGNOSIS — E2740 Unspecified adrenocortical insufficiency: Secondary | ICD-10-CM | POA: Diagnosis not present

## 2023-05-01 DIAGNOSIS — H269 Unspecified cataract: Secondary | ICD-10-CM

## 2023-05-01 DIAGNOSIS — F411 Generalized anxiety disorder: Secondary | ICD-10-CM | POA: Diagnosis not present

## 2023-05-01 DIAGNOSIS — G40909 Epilepsy, unspecified, not intractable, without status epilepticus: Secondary | ICD-10-CM | POA: Diagnosis not present

## 2023-05-01 DIAGNOSIS — I69354 Hemiplegia and hemiparesis following cerebral infarction affecting left non-dominant side: Secondary | ICD-10-CM

## 2023-05-01 DIAGNOSIS — E291 Testicular hypofunction: Secondary | ICD-10-CM

## 2023-05-01 DIAGNOSIS — E2749 Other adrenocortical insufficiency: Secondary | ICD-10-CM | POA: Diagnosis not present

## 2023-05-01 DIAGNOSIS — G9389 Other specified disorders of brain: Secondary | ICD-10-CM

## 2023-05-01 DIAGNOSIS — H9190 Unspecified hearing loss, unspecified ear: Secondary | ICD-10-CM

## 2023-05-02 DIAGNOSIS — E2740 Unspecified adrenocortical insufficiency: Secondary | ICD-10-CM | POA: Diagnosis not present

## 2023-05-02 DIAGNOSIS — F411 Generalized anxiety disorder: Secondary | ICD-10-CM | POA: Diagnosis not present

## 2023-05-02 DIAGNOSIS — G40909 Epilepsy, unspecified, not intractable, without status epilepticus: Secondary | ICD-10-CM | POA: Diagnosis not present

## 2023-05-02 DIAGNOSIS — E291 Testicular hypofunction: Secondary | ICD-10-CM | POA: Diagnosis not present

## 2023-05-02 DIAGNOSIS — E038 Other specified hypothyroidism: Secondary | ICD-10-CM | POA: Diagnosis not present

## 2023-05-02 DIAGNOSIS — I69354 Hemiplegia and hemiparesis following cerebral infarction affecting left non-dominant side: Secondary | ICD-10-CM | POA: Diagnosis not present

## 2023-05-02 NOTE — Telephone Encounter (Signed)
Attempted to call mom no answer - left vm   Closing call , if they call back please fwd to nurse

## 2023-05-03 DIAGNOSIS — E038 Other specified hypothyroidism: Secondary | ICD-10-CM | POA: Diagnosis not present

## 2023-05-03 DIAGNOSIS — I69354 Hemiplegia and hemiparesis following cerebral infarction affecting left non-dominant side: Secondary | ICD-10-CM | POA: Diagnosis not present

## 2023-05-03 DIAGNOSIS — E2740 Unspecified adrenocortical insufficiency: Secondary | ICD-10-CM | POA: Diagnosis not present

## 2023-05-03 DIAGNOSIS — F411 Generalized anxiety disorder: Secondary | ICD-10-CM | POA: Diagnosis not present

## 2023-05-03 DIAGNOSIS — E291 Testicular hypofunction: Secondary | ICD-10-CM | POA: Diagnosis not present

## 2023-05-03 DIAGNOSIS — G40909 Epilepsy, unspecified, not intractable, without status epilepticus: Secondary | ICD-10-CM | POA: Diagnosis not present

## 2023-05-04 ENCOUNTER — Telehealth: Payer: Self-pay | Admitting: Family Medicine

## 2023-05-04 NOTE — Telephone Encounter (Signed)
VO given for nursing frequency 

## 2023-05-08 DIAGNOSIS — E2740 Unspecified adrenocortical insufficiency: Secondary | ICD-10-CM | POA: Diagnosis not present

## 2023-05-08 DIAGNOSIS — I69354 Hemiplegia and hemiparesis following cerebral infarction affecting left non-dominant side: Secondary | ICD-10-CM | POA: Diagnosis not present

## 2023-05-08 DIAGNOSIS — G40909 Epilepsy, unspecified, not intractable, without status epilepticus: Secondary | ICD-10-CM | POA: Diagnosis not present

## 2023-05-08 DIAGNOSIS — E038 Other specified hypothyroidism: Secondary | ICD-10-CM | POA: Diagnosis not present

## 2023-05-08 DIAGNOSIS — F411 Generalized anxiety disorder: Secondary | ICD-10-CM | POA: Diagnosis not present

## 2023-05-08 DIAGNOSIS — E291 Testicular hypofunction: Secondary | ICD-10-CM | POA: Diagnosis not present

## 2023-05-09 DIAGNOSIS — I639 Cerebral infarction, unspecified: Secondary | ICD-10-CM | POA: Diagnosis not present

## 2023-05-09 DIAGNOSIS — M1612 Unilateral primary osteoarthritis, left hip: Secondary | ICD-10-CM | POA: Diagnosis not present

## 2023-05-09 DIAGNOSIS — F17200 Nicotine dependence, unspecified, uncomplicated: Secondary | ICD-10-CM | POA: Diagnosis not present

## 2023-05-09 DIAGNOSIS — Z8673 Personal history of transient ischemic attack (TIA), and cerebral infarction without residual deficits: Secondary | ICD-10-CM | POA: Diagnosis not present

## 2023-05-10 DIAGNOSIS — G40909 Epilepsy, unspecified, not intractable, without status epilepticus: Secondary | ICD-10-CM | POA: Diagnosis not present

## 2023-05-10 DIAGNOSIS — F411 Generalized anxiety disorder: Secondary | ICD-10-CM | POA: Diagnosis not present

## 2023-05-10 DIAGNOSIS — E2740 Unspecified adrenocortical insufficiency: Secondary | ICD-10-CM | POA: Diagnosis not present

## 2023-05-10 DIAGNOSIS — E291 Testicular hypofunction: Secondary | ICD-10-CM | POA: Diagnosis not present

## 2023-05-10 DIAGNOSIS — I69354 Hemiplegia and hemiparesis following cerebral infarction affecting left non-dominant side: Secondary | ICD-10-CM | POA: Diagnosis not present

## 2023-05-10 DIAGNOSIS — E038 Other specified hypothyroidism: Secondary | ICD-10-CM | POA: Diagnosis not present

## 2023-05-15 DIAGNOSIS — G40909 Epilepsy, unspecified, not intractable, without status epilepticus: Secondary | ICD-10-CM | POA: Diagnosis not present

## 2023-05-15 DIAGNOSIS — I69354 Hemiplegia and hemiparesis following cerebral infarction affecting left non-dominant side: Secondary | ICD-10-CM | POA: Diagnosis not present

## 2023-05-15 DIAGNOSIS — E291 Testicular hypofunction: Secondary | ICD-10-CM | POA: Diagnosis not present

## 2023-05-15 DIAGNOSIS — F411 Generalized anxiety disorder: Secondary | ICD-10-CM | POA: Diagnosis not present

## 2023-05-15 DIAGNOSIS — E038 Other specified hypothyroidism: Secondary | ICD-10-CM | POA: Diagnosis not present

## 2023-05-15 DIAGNOSIS — E2740 Unspecified adrenocortical insufficiency: Secondary | ICD-10-CM | POA: Diagnosis not present

## 2023-05-18 DIAGNOSIS — I69354 Hemiplegia and hemiparesis following cerebral infarction affecting left non-dominant side: Secondary | ICD-10-CM | POA: Diagnosis not present

## 2023-05-18 DIAGNOSIS — E291 Testicular hypofunction: Secondary | ICD-10-CM | POA: Diagnosis not present

## 2023-05-18 DIAGNOSIS — E038 Other specified hypothyroidism: Secondary | ICD-10-CM | POA: Diagnosis not present

## 2023-05-18 DIAGNOSIS — E2740 Unspecified adrenocortical insufficiency: Secondary | ICD-10-CM | POA: Diagnosis not present

## 2023-05-18 DIAGNOSIS — G40909 Epilepsy, unspecified, not intractable, without status epilepticus: Secondary | ICD-10-CM | POA: Diagnosis not present

## 2023-05-18 DIAGNOSIS — F411 Generalized anxiety disorder: Secondary | ICD-10-CM | POA: Diagnosis not present

## 2023-05-22 ENCOUNTER — Encounter: Payer: Self-pay | Admitting: Family Medicine

## 2023-05-22 ENCOUNTER — Ambulatory Visit (INDEPENDENT_AMBULATORY_CARE_PROVIDER_SITE_OTHER): Payer: Medicare Other | Admitting: Family Medicine

## 2023-05-22 ENCOUNTER — Other Ambulatory Visit: Payer: Self-pay | Admitting: Family Medicine

## 2023-05-22 VITALS — BP 108/73 | HR 110 | Temp 98.5°F | Ht 71.0 in | Wt 145.0 lb

## 2023-05-22 DIAGNOSIS — M1612 Unilateral primary osteoarthritis, left hip: Secondary | ICD-10-CM | POA: Diagnosis not present

## 2023-05-22 DIAGNOSIS — M8700 Idiopathic aseptic necrosis of unspecified bone: Secondary | ICD-10-CM | POA: Diagnosis not present

## 2023-05-22 DIAGNOSIS — M25562 Pain in left knee: Secondary | ICD-10-CM | POA: Diagnosis not present

## 2023-05-22 DIAGNOSIS — Z7409 Other reduced mobility: Secondary | ICD-10-CM

## 2023-05-22 DIAGNOSIS — R32 Unspecified urinary incontinence: Secondary | ICD-10-CM | POA: Diagnosis not present

## 2023-05-22 DIAGNOSIS — Z8673 Personal history of transient ischemic attack (TIA), and cerebral infarction without residual deficits: Secondary | ICD-10-CM

## 2023-05-22 DIAGNOSIS — R159 Full incontinence of feces: Secondary | ICD-10-CM | POA: Diagnosis not present

## 2023-05-22 DIAGNOSIS — M25462 Effusion, left knee: Secondary | ICD-10-CM

## 2023-05-22 MED ORDER — CETIRIZINE HCL 10 MG PO TABS: 10.0000 mg | ORAL_TABLET | Freq: Every day | ORAL | 3 refills | Status: DC

## 2023-05-22 MED ORDER — BACLOFEN 10 MG PO TABS: ORAL_TABLET | ORAL | 1 refills | Status: DC

## 2023-05-22 NOTE — Patient Instructions (Signed)
Will see if we can get supplies through the home health for the bed

## 2023-05-22 NOTE — Progress Notes (Signed)
Subjective: ZO:[id number] follow up PCP: Raliegh Ip, DO UJW:[id number] James Hoffman is a 27 y.o. male presenting to clinic today for:  1. Hospital follow up for right cryptogenic CVA Place on ASA 81mg  indefinitely.  Advised to follow up with stroke team 1 month following discharge.  He is brought today's visit by his mother.  She notes that he had ongoing left-sided weakness after the stroke.  Home health physical therapy is coming in 3 days/week but she is not quite sure which facility they are utilizing.  She notes that he needs some depends.  He is already getting wipes and chuck pads from an organization.  She thinks may be aero flow.  He has both fecal and urinary incontinence and wears a size large.  Needs new bedside commode as his current 1 is broken.  Again limited mobility secondary to left-sided weakness, left-sided avascular necrosis of the hip.  Will be establishing with pain management soon for this but needs refill on baclofen in the meantime.  He should be seeing stroke team soon but has already been seen by his primary neurologist.  His testosterone dose was also recently adjusted but she is not quite sure what the dose is.   ROS: Per HPI  Allergies  Allergen Reactions   Tramadol Anaphylaxis   Morphine Nausea And Vomiting    According to mother the patient is intolerant of morphine; causes nausea and vomiting.  Has had none since he was 27yo.   Past Medical History:  Diagnosis Date   'Light-for-dates' infant with signs of fetal malnutrition 02/11/2021   Acute respiratory failure with hypoxia (HCC) 03/20/2018   Last Assessment & Plan:   Formatting of this note might be different from the original.  2/2 aspiration PNA  -Resolved; continue supportive care  -Swallow eval passed, tolerating PO diet   Adrenal insufficiency (HCC)    Aspiration pneumonia of right lung (HCC) 09/09/2020   Cancer (HCC)    brain tumor on brain stem   Encephalopathy 03/03/2019   Last Assessment &  Plan:   Formatting of this note might be different from the original.  Patient w/ ongoing agitation/restlessness that is worse than baseline per family. Re-engage w/ Neurology for repeat EEG w/ LTM, may consider LP as well. Per Dr. Burna Forts he does not feel this is likely malignancy, less likely infectious, but may be due to seizure activity or inflammatory process.   Hydrocephalus (HCC)    Osteoporosis    Sepsis (HCC) 09/09/2020   Last Assessment & Plan:   Formatting of this note might be different from the original.  2/2 aspiration pneumonia, c/b encephalopathy.  -Encephalopathy has improved but patient still not back to baseline per family.  Further w/u as noted  -F/u w/ Dr. Burna Forts in 4-6 weeks.  -Completed abx in-house   Thyroid disease     Current Outpatient Medications:    atorvastatin (LIPITOR) 40 MG tablet, TAKE 1 TABLET BY MOUTH EVERYDAY AT BEDTIME, Disp: 90 tablet, Rfl: 0   baclofen (LIORESAL) 10 MG tablet, TAKE 1 TABLET BY MOUTH 3 TIMES A DAY AS NEEDED FOR MUSCLE SPASMS -- REPLACING TIZANIDINE, Disp: 60 tablet, Rfl: 1   busPIRone (BUSPAR) 10 MG tablet, Take 1 tablet (10 mg total) by mouth 3 (three) times daily. For Anxiety, Disp: 270 tablet, Rfl: 1   celecoxib (CELEBREX) 50 MG capsule, Take 1 capsule (50 mg total) by mouth 2 (two) times daily as needed for pain., Disp: 180 capsule, Rfl: 1   cetirizine (ZYRTEC)  10 MG tablet, TAKE 1 TABLET BY MOUTH EVERY DAY, Disp: 90 tablet, Rfl: 1   Cholecalciferol 50 MCG (2000 UT) CAPS, Take by mouth., Disp: , Rfl:    clotrimazole-betamethasone (LOTRISONE) cream, APPLY 1 APPLICATION TOPICALLY 2 (TWO) TIMES DAILY. X 7-10 DAYS, Disp: 30 g, Rfl: 0   fluticasone (FLONASE) 50 MCG/ACT nasal spray, SPRAY 2 SPRAYS INTO EACH NOSTRIL EVERY DAY, Disp: 48 mL, Rfl: 1   Fluticasone Furoate (ARNUITY ELLIPTA) 100 MCG/ACT AEPB, Inhale 1 puff into the lungs daily., Disp: 30 each, Rfl: 12   levETIRAcetam (KEPPRA) 750 MG tablet, Take 1,500 mg by mouth in the morning and at  bedtime., Disp: , Rfl:    levothyroxine (SYNTHROID) 88 MCG tablet, Take 88 mcg by mouth daily before breakfast., Disp: , Rfl:    omeprazole (PRILOSEC) 20 MG capsule, TAKE 1 CAPSULE (20 MG TOTAL) BY MOUTH DAILY. AT LUNCH TIME, Disp: 90 capsule, Rfl: 3   potassium chloride SA (KLOR-CON M) 20 MEQ tablet, Take 1 tablet (20 mEq total) by mouth 2 (two) times daily., Disp: 30 tablet, Rfl: 2   prednisoLONE 5 MG TABS tablet, Take by mouth., Disp: , Rfl:    QUEtiapine (SEROQUEL) 25 MG tablet, TAKE 1 TABLET BY MOUTH EVERYDAY AT BEDTIME, Disp: 90 tablet, Rfl: 0   Spacer/Aero-Holding Chambers (AEROCHAMBER PLUS) inhaler, Use as instructed, Disp: 1 each, Rfl: 2   Testosterone 20.25 MG/ACT (1.62%) GEL, ONE PUM ON EACH SHOULDER EVERY MORNIG, Disp: 75 g, Rfl: 1   VENTOLIN HFA 108 (90 Base) MCG/ACT inhaler, TAKE 2 PUFFS BY MOUTH EVERY 6 HOURS AS NEEDED FOR WHEEZE OR SHORTNESS OF BREATH, Disp: 18 each, Rfl: 0 Social History   Socioeconomic History   Marital status: Single    Spouse name: Not on file   Number of children: 0   Years of education: 7th grade   Highest education level: 7th grade  Occupational History   Occupation: disabled  Tobacco Use   Smoking status: Every Day    Packs/day: 0.25    Years: 6.00    Additional pack years: 0.00    Total pack years: 1.50    Types: Cigarettes    Start date: 11/25/2012   Smokeless tobacco: Never  Vaping Use   Vaping Use: Never used  Substance and Sexual Activity   Alcohol use: No   Drug use: No   Sexual activity: Not Currently  Other Topics Concern   Not on file  Social History Narrative   Lives with his grandmother   Mostly wheelchair bound   Sometimes he uses walker when knees aren't hurt   Mostly dependent on others for assistance - eats on his own, but can't prepare meals, has to have help in and out of shower, but bathes himself, etc.   Social Determinants of Health   Financial Resource Strain: Low Risk  (02/20/2023)   Overall Financial Resource  Strain (CARDIA)    Difficulty of Paying Living Expenses: Not hard at all  Food Insecurity: No Food Insecurity (02/20/2023)   Hunger Vital Sign    Worried About Running Out of Food in the Last Year: Never true    Ran Out of Food in the Last Year: Never true  Transportation Needs: No Transportation Needs (02/20/2023)   PRAPARE - Administrator, Civil Service (Medical): No    Lack of Transportation (Non-Medical): No  Physical Activity: Inactive (02/20/2023)   Exercise Vital Sign    Days of Exercise per Week: 0 days    Minutes  of Exercise per Session: 0 min  Stress: No Stress Concern Present (02/20/2023)   [employer] of Occupational Health - Occupational Stress Questionnaire    Feeling of Stress : Not at all  Social Connections: Socially Isolated (02/20/2023)   Social Connection and Isolation Panel [NHANES]    Frequency of Communication with Friends and Family: More than three times a week    Frequency of Social Gatherings with Friends and Family: More than three times a week    Attends Religious Services: Never    Database administrator or Organizations: No    Attends Banker Meetings: Never    Marital Status: Never married  Intimate Partner Violence: Not At Risk (02/20/2023)   Humiliation, Afraid, Rape, and Kick questionnaire    Fear of Current or Ex-Partner: No    Emotionally Abused: No    Physically Abused: No    Sexually Abused: No   No family history on file.  Objective: Office vital signs reviewed. BP 108/73   Pulse (!) 110   Temp 98.5 F (36.9 C)   Ht 5\' 11"  (1.803 m)   Wt 145 lb (65.8 kg)   SpO2 98%   BMI 20.22 kg/m   Physical Examination:  General: Awake, alert, chronically ill-appearing male, No acute distress HEENT: sclera white Cardio: regular rate and rhythm, S1S2 heard, no murmurs appreciated Pulm: clear to auscultation bilaterally, no wheezes, rhonchi or rales; normal work of breathing on room air.  Coughing intermittently Neuro:  Difficult to understand speech but is conversive with provider.  He has left-sided weakness and really does not raise his left upper extremity or lower extremity independently.  He uses his right side to lift the left side.  He has generalized atrophy of the muscles and arrives today in wheelchair  Assessment/ Plan: 27 y.o. male   History of cerebrovascular accident (CVA) in adulthood - Plan: DME Bedside commode, For home use only DME Hospital bed, CANCELED: For home use only DME Hospital bed  Impaired mobility and endurance - Plan: baclofen (LIORESAL) 10 MG tablet, DME Bedside commode, For home use only DME Hospital bed, Incontinence supply, CANCELED: For home use only DME Hospital bed  Pain and swelling of left knee - Plan: baclofen (LIORESAL) 10 MG tablet  Arthritis of left hip - Plan: baclofen (LIORESAL) 10 MG tablet, DME Bedside commode, For home use only DME Hospital bed, Incontinence supply, CANCELED: For home use only DME Hospital bed  AVN (avascular necrosis of bone) (HCC) - Plan: DME Bedside commode, For home use only DME Hospital bed, Incontinence supply, CANCELED: For home use only DME Hospital bed  Urinary incontinence, unspecified type - Plan: DME Bedside commode, Incontinence supply  Incontinence of feces, unspecified fecal incontinence type - Plan: DME Bedside commode, Incontinence supply  DME for bedside commode, hospital bed with alarm and side rails placed.  Will get this coordinated along with his incontinence supplies to his current DME supplier  Baclofen renewed for now but I agree with pain management referral.  Would actually recommend that he not utilize Celebrex given recent CVA  No orders of the defined types were placed in this encounter.  No orders of the defined types were placed in this encounter.    Raliegh Ip, DO [facility] [phone]

## 2023-05-23 DIAGNOSIS — E038 Other specified hypothyroidism: Secondary | ICD-10-CM | POA: Diagnosis not present

## 2023-05-23 DIAGNOSIS — E291 Testicular hypofunction: Secondary | ICD-10-CM | POA: Diagnosis not present

## 2023-05-23 DIAGNOSIS — F411 Generalized anxiety disorder: Secondary | ICD-10-CM | POA: Diagnosis not present

## 2023-05-23 DIAGNOSIS — I69354 Hemiplegia and hemiparesis following cerebral infarction affecting left non-dominant side: Secondary | ICD-10-CM | POA: Diagnosis not present

## 2023-05-23 DIAGNOSIS — E2740 Unspecified adrenocortical insufficiency: Secondary | ICD-10-CM | POA: Diagnosis not present

## 2023-05-23 DIAGNOSIS — G40909 Epilepsy, unspecified, not intractable, without status epilepticus: Secondary | ICD-10-CM | POA: Diagnosis not present

## 2023-05-25 ENCOUNTER — Telehealth: Payer: Self-pay | Admitting: Family Medicine

## 2023-05-25 DIAGNOSIS — E038 Other specified hypothyroidism: Secondary | ICD-10-CM | POA: Diagnosis not present

## 2023-05-25 DIAGNOSIS — F411 Generalized anxiety disorder: Secondary | ICD-10-CM | POA: Diagnosis not present

## 2023-05-25 DIAGNOSIS — E291 Testicular hypofunction: Secondary | ICD-10-CM | POA: Diagnosis not present

## 2023-05-25 DIAGNOSIS — G40909 Epilepsy, unspecified, not intractable, without status epilepticus: Secondary | ICD-10-CM | POA: Diagnosis not present

## 2023-05-25 DIAGNOSIS — I69354 Hemiplegia and hemiparesis following cerebral infarction affecting left non-dominant side: Secondary | ICD-10-CM | POA: Diagnosis not present

## 2023-05-25 DIAGNOSIS — E2740 Unspecified adrenocortical insufficiency: Secondary | ICD-10-CM | POA: Diagnosis not present

## 2023-05-25 NOTE — Telephone Encounter (Signed)
Spoke with heidi. Questions were answered. She will be sending over prescription renewals as well for pt soon.

## 2023-05-25 NOTE — Telephone Encounter (Signed)
Left message with heidi to call back.

## 2023-05-26 DIAGNOSIS — Z7951 Long term (current) use of inhaled steroids: Secondary | ICD-10-CM | POA: Diagnosis not present

## 2023-05-26 DIAGNOSIS — M810 Age-related osteoporosis without current pathological fracture: Secondary | ICD-10-CM | POA: Diagnosis not present

## 2023-05-26 DIAGNOSIS — E291 Testicular hypofunction: Secondary | ICD-10-CM | POA: Diagnosis not present

## 2023-05-26 DIAGNOSIS — Z7901 Long term (current) use of anticoagulants: Secondary | ICD-10-CM | POA: Diagnosis not present

## 2023-05-26 DIAGNOSIS — H5316 Psychophysical visual disturbances: Secondary | ICD-10-CM | POA: Diagnosis not present

## 2023-05-26 DIAGNOSIS — E038 Other specified hypothyroidism: Secondary | ICD-10-CM | POA: Diagnosis not present

## 2023-05-26 DIAGNOSIS — H9190 Unspecified hearing loss, unspecified ear: Secondary | ICD-10-CM | POA: Diagnosis not present

## 2023-05-26 DIAGNOSIS — Z7982 Long term (current) use of aspirin: Secondary | ICD-10-CM | POA: Diagnosis not present

## 2023-05-26 DIAGNOSIS — E559 Vitamin D deficiency, unspecified: Secondary | ICD-10-CM | POA: Diagnosis not present

## 2023-05-26 DIAGNOSIS — G9389 Other specified disorders of brain: Secondary | ICD-10-CM | POA: Diagnosis not present

## 2023-05-26 DIAGNOSIS — E2740 Unspecified adrenocortical insufficiency: Secondary | ICD-10-CM | POA: Diagnosis not present

## 2023-05-26 DIAGNOSIS — G40909 Epilepsy, unspecified, not intractable, without status epilepticus: Secondary | ICD-10-CM | POA: Diagnosis not present

## 2023-05-26 DIAGNOSIS — Z85841 Personal history of malignant neoplasm of brain: Secondary | ICD-10-CM | POA: Diagnosis not present

## 2023-05-26 DIAGNOSIS — Z79899 Other long term (current) drug therapy: Secondary | ICD-10-CM | POA: Diagnosis not present

## 2023-05-26 DIAGNOSIS — I69354 Hemiplegia and hemiparesis following cerebral infarction affecting left non-dominant side: Secondary | ICD-10-CM | POA: Diagnosis not present

## 2023-05-26 DIAGNOSIS — H269 Unspecified cataract: Secondary | ICD-10-CM | POA: Diagnosis not present

## 2023-05-26 DIAGNOSIS — F411 Generalized anxiety disorder: Secondary | ICD-10-CM | POA: Diagnosis not present

## 2023-05-26 DIAGNOSIS — Z7952 Long term (current) use of systemic steroids: Secondary | ICD-10-CM | POA: Diagnosis not present

## 2023-05-26 DIAGNOSIS — K219 Gastro-esophageal reflux disease without esophagitis: Secondary | ICD-10-CM | POA: Diagnosis not present

## 2023-05-26 DIAGNOSIS — Z604 Social exclusion and rejection: Secondary | ICD-10-CM | POA: Diagnosis not present

## 2023-05-29 ENCOUNTER — Telehealth: Payer: Self-pay | Admitting: Family Medicine

## 2023-05-29 DIAGNOSIS — I69354 Hemiplegia and hemiparesis following cerebral infarction affecting left non-dominant side: Secondary | ICD-10-CM | POA: Diagnosis not present

## 2023-05-29 DIAGNOSIS — E2740 Unspecified adrenocortical insufficiency: Secondary | ICD-10-CM | POA: Diagnosis not present

## 2023-05-29 DIAGNOSIS — E291 Testicular hypofunction: Secondary | ICD-10-CM | POA: Diagnosis not present

## 2023-05-29 DIAGNOSIS — E038 Other specified hypothyroidism: Secondary | ICD-10-CM | POA: Diagnosis not present

## 2023-05-29 DIAGNOSIS — F411 Generalized anxiety disorder: Secondary | ICD-10-CM | POA: Diagnosis not present

## 2023-05-29 DIAGNOSIS — G40909 Epilepsy, unspecified, not intractable, without status epilepticus: Secondary | ICD-10-CM | POA: Diagnosis not present

## 2023-05-29 NOTE — Telephone Encounter (Signed)
[name] called from [facility] to see if pts PCP will order a Bedside commode and  single gel mattress pad for bed.

## 2023-05-30 NOTE — Telephone Encounter (Signed)
I think we did this already.  Do you still have those orders,Cathy?

## 2023-05-30 NOTE — Telephone Encounter (Signed)
RTC to James Hoffman, bedside commode & hospital bed were order and these were sent to [facility] case worker at [employer] at fax# [phone] on 05/23/23, she will let her know.

## 2023-05-31 DIAGNOSIS — E2740 Unspecified adrenocortical insufficiency: Secondary | ICD-10-CM | POA: Diagnosis not present

## 2023-05-31 DIAGNOSIS — E291 Testicular hypofunction: Secondary | ICD-10-CM | POA: Diagnosis not present

## 2023-05-31 DIAGNOSIS — G40909 Epilepsy, unspecified, not intractable, without status epilepticus: Secondary | ICD-10-CM | POA: Diagnosis not present

## 2023-05-31 DIAGNOSIS — F411 Generalized anxiety disorder: Secondary | ICD-10-CM | POA: Diagnosis not present

## 2023-05-31 DIAGNOSIS — I69354 Hemiplegia and hemiparesis following cerebral infarction affecting left non-dominant side: Secondary | ICD-10-CM | POA: Diagnosis not present

## 2023-05-31 DIAGNOSIS — E038 Other specified hypothyroidism: Secondary | ICD-10-CM | POA: Diagnosis not present

## 2023-06-01 ENCOUNTER — Other Ambulatory Visit: Payer: Self-pay | Admitting: Family Medicine

## 2023-06-01 DIAGNOSIS — J9801 Acute bronchospasm: Secondary | ICD-10-CM

## 2023-06-01 DIAGNOSIS — R062 Wheezing: Secondary | ICD-10-CM

## 2023-06-04 DIAGNOSIS — I639 Cerebral infarction, unspecified: Secondary | ICD-10-CM | POA: Diagnosis not present

## 2023-06-07 DIAGNOSIS — E038 Other specified hypothyroidism: Secondary | ICD-10-CM | POA: Diagnosis not present

## 2023-06-07 DIAGNOSIS — I69354 Hemiplegia and hemiparesis following cerebral infarction affecting left non-dominant side: Secondary | ICD-10-CM | POA: Diagnosis not present

## 2023-06-07 DIAGNOSIS — E291 Testicular hypofunction: Secondary | ICD-10-CM | POA: Diagnosis not present

## 2023-06-07 DIAGNOSIS — G40909 Epilepsy, unspecified, not intractable, without status epilepticus: Secondary | ICD-10-CM | POA: Diagnosis not present

## 2023-06-07 DIAGNOSIS — E2740 Unspecified adrenocortical insufficiency: Secondary | ICD-10-CM | POA: Diagnosis not present

## 2023-06-07 DIAGNOSIS — F411 Generalized anxiety disorder: Secondary | ICD-10-CM | POA: Diagnosis not present

## 2023-06-08 DIAGNOSIS — F411 Generalized anxiety disorder: Secondary | ICD-10-CM | POA: Diagnosis not present

## 2023-06-08 DIAGNOSIS — G40909 Epilepsy, unspecified, not intractable, without status epilepticus: Secondary | ICD-10-CM | POA: Diagnosis not present

## 2023-06-08 DIAGNOSIS — I69354 Hemiplegia and hemiparesis following cerebral infarction affecting left non-dominant side: Secondary | ICD-10-CM | POA: Diagnosis not present

## 2023-06-08 DIAGNOSIS — E2740 Unspecified adrenocortical insufficiency: Secondary | ICD-10-CM | POA: Diagnosis not present

## 2023-06-08 DIAGNOSIS — E038 Other specified hypothyroidism: Secondary | ICD-10-CM | POA: Diagnosis not present

## 2023-06-08 DIAGNOSIS — E291 Testicular hypofunction: Secondary | ICD-10-CM | POA: Diagnosis not present

## 2023-06-11 DIAGNOSIS — E2740 Unspecified adrenocortical insufficiency: Secondary | ICD-10-CM | POA: Diagnosis not present

## 2023-06-11 DIAGNOSIS — E291 Testicular hypofunction: Secondary | ICD-10-CM | POA: Diagnosis not present

## 2023-06-11 DIAGNOSIS — G40909 Epilepsy, unspecified, not intractable, without status epilepticus: Secondary | ICD-10-CM | POA: Diagnosis not present

## 2023-06-11 DIAGNOSIS — I69354 Hemiplegia and hemiparesis following cerebral infarction affecting left non-dominant side: Secondary | ICD-10-CM | POA: Diagnosis not present

## 2023-06-11 DIAGNOSIS — E038 Other specified hypothyroidism: Secondary | ICD-10-CM | POA: Diagnosis not present

## 2023-06-11 DIAGNOSIS — F411 Generalized anxiety disorder: Secondary | ICD-10-CM | POA: Diagnosis not present

## 2023-06-12 ENCOUNTER — Telehealth: Payer: Self-pay | Admitting: *Deleted

## 2023-06-12 DIAGNOSIS — E038 Other specified hypothyroidism: Secondary | ICD-10-CM | POA: Diagnosis not present

## 2023-06-12 DIAGNOSIS — G40909 Epilepsy, unspecified, not intractable, without status epilepticus: Secondary | ICD-10-CM | POA: Diagnosis not present

## 2023-06-12 DIAGNOSIS — E2740 Unspecified adrenocortical insufficiency: Secondary | ICD-10-CM | POA: Diagnosis not present

## 2023-06-12 DIAGNOSIS — F411 Generalized anxiety disorder: Secondary | ICD-10-CM | POA: Diagnosis not present

## 2023-06-12 DIAGNOSIS — I69354 Hemiplegia and hemiparesis following cerebral infarction affecting left non-dominant side: Secondary | ICD-10-CM | POA: Diagnosis not present

## 2023-06-12 DIAGNOSIS — E291 Testicular hypofunction: Secondary | ICD-10-CM | POA: Diagnosis not present

## 2023-06-12 NOTE — Telephone Encounter (Signed)
VO given to James Hoffman w/ [facility] for portable CXR w/ STAT read

## 2023-06-12 NOTE — Telephone Encounter (Signed)
Ok to order portable CXR. Stat read please.

## 2023-06-12 NOTE — Telephone Encounter (Signed)
TC from [name] w/ [facility] Pt has a lot of congestion rales in upper lobes, faint in lower lobes, non-productive cough, no fever, O2 sats are normal Can a portable CXR be ordered If appropriate call [name] back.

## 2023-06-13 DIAGNOSIS — Z79899 Other long term (current) drug therapy: Secondary | ICD-10-CM | POA: Diagnosis not present

## 2023-06-13 DIAGNOSIS — M25561 Pain in right knee: Secondary | ICD-10-CM | POA: Diagnosis not present

## 2023-06-13 DIAGNOSIS — M25552 Pain in left hip: Secondary | ICD-10-CM | POA: Diagnosis not present

## 2023-06-13 DIAGNOSIS — G8929 Other chronic pain: Secondary | ICD-10-CM | POA: Diagnosis not present

## 2023-06-13 DIAGNOSIS — M25551 Pain in right hip: Secondary | ICD-10-CM | POA: Diagnosis not present

## 2023-06-13 DIAGNOSIS — M1612 Unilateral primary osteoarthritis, left hip: Secondary | ICD-10-CM | POA: Diagnosis not present

## 2023-06-13 DIAGNOSIS — M25562 Pain in left knee: Secondary | ICD-10-CM | POA: Diagnosis not present

## 2023-06-13 DIAGNOSIS — Z8673 Personal history of transient ischemic attack (TIA), and cerebral infarction without residual deficits: Secondary | ICD-10-CM | POA: Diagnosis not present

## 2023-06-14 DIAGNOSIS — E291 Testicular hypofunction: Secondary | ICD-10-CM | POA: Diagnosis not present

## 2023-06-14 DIAGNOSIS — E2740 Unspecified adrenocortical insufficiency: Secondary | ICD-10-CM | POA: Diagnosis not present

## 2023-06-14 DIAGNOSIS — F411 Generalized anxiety disorder: Secondary | ICD-10-CM | POA: Diagnosis not present

## 2023-06-14 DIAGNOSIS — E038 Other specified hypothyroidism: Secondary | ICD-10-CM | POA: Diagnosis not present

## 2023-06-14 DIAGNOSIS — R918 Other nonspecific abnormal finding of lung field: Secondary | ICD-10-CM | POA: Diagnosis not present

## 2023-06-14 DIAGNOSIS — G40909 Epilepsy, unspecified, not intractable, without status epilepticus: Secondary | ICD-10-CM | POA: Diagnosis not present

## 2023-06-14 DIAGNOSIS — I69354 Hemiplegia and hemiparesis following cerebral infarction affecting left non-dominant side: Secondary | ICD-10-CM | POA: Diagnosis not present

## 2023-06-15 ENCOUNTER — Other Ambulatory Visit: Payer: Self-pay | Admitting: Family Medicine

## 2023-06-15 ENCOUNTER — Other Ambulatory Visit: Payer: Self-pay | Admitting: *Deleted

## 2023-06-15 DIAGNOSIS — M25462 Effusion, left knee: Secondary | ICD-10-CM

## 2023-06-15 DIAGNOSIS — J189 Pneumonia, unspecified organism: Secondary | ICD-10-CM

## 2023-06-15 DIAGNOSIS — M1612 Unilateral primary osteoarthritis, left hip: Secondary | ICD-10-CM

## 2023-06-15 DIAGNOSIS — Z7409 Other reduced mobility: Secondary | ICD-10-CM

## 2023-06-15 MED ORDER — AZITHROMYCIN 250 MG PO TABS
ORAL_TABLET | ORAL | 0 refills | Status: DC
Start: 2023-06-15 — End: 2023-06-15

## 2023-06-15 MED ORDER — AZITHROMYCIN 250 MG PO TABS: ORAL_TABLET | ORAL | 0 refills | Status: DC

## 2023-06-18 NOTE — Telephone Encounter (Signed)
Grandmother picked up the abx over the weekend and has already had one dose and will finish the rest. CXR should small right base infiltrate.

## 2023-06-19 DIAGNOSIS — E2740 Unspecified adrenocortical insufficiency: Secondary | ICD-10-CM | POA: Diagnosis not present

## 2023-06-19 DIAGNOSIS — I69354 Hemiplegia and hemiparesis following cerebral infarction affecting left non-dominant side: Secondary | ICD-10-CM | POA: Diagnosis not present

## 2023-06-19 DIAGNOSIS — F411 Generalized anxiety disorder: Secondary | ICD-10-CM | POA: Diagnosis not present

## 2023-06-19 DIAGNOSIS — E291 Testicular hypofunction: Secondary | ICD-10-CM | POA: Diagnosis not present

## 2023-06-19 DIAGNOSIS — G40909 Epilepsy, unspecified, not intractable, without status epilepticus: Secondary | ICD-10-CM | POA: Diagnosis not present

## 2023-06-19 DIAGNOSIS — E038 Other specified hypothyroidism: Secondary | ICD-10-CM | POA: Diagnosis not present

## 2023-06-20 ENCOUNTER — Telehealth: Payer: Self-pay | Admitting: Family Medicine

## 2023-06-20 DIAGNOSIS — I69354 Hemiplegia and hemiparesis following cerebral infarction affecting left non-dominant side: Secondary | ICD-10-CM | POA: Diagnosis not present

## 2023-06-20 DIAGNOSIS — E038 Other specified hypothyroidism: Secondary | ICD-10-CM | POA: Diagnosis not present

## 2023-06-20 DIAGNOSIS — E291 Testicular hypofunction: Secondary | ICD-10-CM | POA: Diagnosis not present

## 2023-06-20 DIAGNOSIS — G40909 Epilepsy, unspecified, not intractable, without status epilepticus: Secondary | ICD-10-CM | POA: Diagnosis not present

## 2023-06-20 DIAGNOSIS — F411 Generalized anxiety disorder: Secondary | ICD-10-CM | POA: Diagnosis not present

## 2023-06-20 DIAGNOSIS — E2740 Unspecified adrenocortical insufficiency: Secondary | ICD-10-CM | POA: Diagnosis not present

## 2023-06-20 NOTE — Telephone Encounter (Signed)
Heads up still congested but better, instructed Zpak is still working to make sure she goes out and evaluates him on Monday since he started the abx on this past Sunday and to let us know.

## 2023-06-20 NOTE — Telephone Encounter (Signed)
Yes, thank you for taking care of this.  The azithromycin can remain in the blood stream for up to 14 days, so it is still working.

## 2023-06-21 DIAGNOSIS — I69354 Hemiplegia and hemiparesis following cerebral infarction affecting left non-dominant side: Secondary | ICD-10-CM | POA: Diagnosis not present

## 2023-06-21 DIAGNOSIS — G40909 Epilepsy, unspecified, not intractable, without status epilepticus: Secondary | ICD-10-CM | POA: Diagnosis not present

## 2023-06-21 DIAGNOSIS — E2740 Unspecified adrenocortical insufficiency: Secondary | ICD-10-CM | POA: Diagnosis not present

## 2023-06-21 DIAGNOSIS — E038 Other specified hypothyroidism: Secondary | ICD-10-CM | POA: Diagnosis not present

## 2023-06-21 DIAGNOSIS — E291 Testicular hypofunction: Secondary | ICD-10-CM | POA: Diagnosis not present

## 2023-06-21 DIAGNOSIS — F411 Generalized anxiety disorder: Secondary | ICD-10-CM | POA: Diagnosis not present

## 2023-06-22 DIAGNOSIS — F411 Generalized anxiety disorder: Secondary | ICD-10-CM | POA: Diagnosis not present

## 2023-06-22 DIAGNOSIS — G40909 Epilepsy, unspecified, not intractable, without status epilepticus: Secondary | ICD-10-CM | POA: Diagnosis not present

## 2023-06-22 DIAGNOSIS — E2740 Unspecified adrenocortical insufficiency: Secondary | ICD-10-CM | POA: Diagnosis not present

## 2023-06-22 DIAGNOSIS — E038 Other specified hypothyroidism: Secondary | ICD-10-CM | POA: Diagnosis not present

## 2023-06-22 DIAGNOSIS — I69354 Hemiplegia and hemiparesis following cerebral infarction affecting left non-dominant side: Secondary | ICD-10-CM | POA: Diagnosis not present

## 2023-06-22 DIAGNOSIS — E291 Testicular hypofunction: Secondary | ICD-10-CM | POA: Diagnosis not present

## 2023-06-25 DIAGNOSIS — Z79899 Other long term (current) drug therapy: Secondary | ICD-10-CM | POA: Diagnosis not present

## 2023-06-25 DIAGNOSIS — G9389 Other specified disorders of brain: Secondary | ICD-10-CM | POA: Diagnosis not present

## 2023-06-25 DIAGNOSIS — E038 Other specified hypothyroidism: Secondary | ICD-10-CM | POA: Diagnosis not present

## 2023-06-25 DIAGNOSIS — E291 Testicular hypofunction: Secondary | ICD-10-CM | POA: Diagnosis not present

## 2023-06-25 DIAGNOSIS — H9190 Unspecified hearing loss, unspecified ear: Secondary | ICD-10-CM | POA: Diagnosis not present

## 2023-06-25 DIAGNOSIS — Z7951 Long term (current) use of inhaled steroids: Secondary | ICD-10-CM | POA: Diagnosis not present

## 2023-06-25 DIAGNOSIS — Z7952 Long term (current) use of systemic steroids: Secondary | ICD-10-CM | POA: Diagnosis not present

## 2023-06-25 DIAGNOSIS — R918 Other nonspecific abnormal finding of lung field: Secondary | ICD-10-CM | POA: Diagnosis not present

## 2023-06-25 DIAGNOSIS — I69354 Hemiplegia and hemiparesis following cerebral infarction affecting left non-dominant side: Secondary | ICD-10-CM | POA: Diagnosis not present

## 2023-06-25 DIAGNOSIS — K219 Gastro-esophageal reflux disease without esophagitis: Secondary | ICD-10-CM | POA: Diagnosis not present

## 2023-06-25 DIAGNOSIS — E2740 Unspecified adrenocortical insufficiency: Secondary | ICD-10-CM | POA: Diagnosis not present

## 2023-06-25 DIAGNOSIS — E559 Vitamin D deficiency, unspecified: Secondary | ICD-10-CM | POA: Diagnosis not present

## 2023-06-25 DIAGNOSIS — H5316 Psychophysical visual disturbances: Secondary | ICD-10-CM | POA: Diagnosis not present

## 2023-06-25 DIAGNOSIS — H269 Unspecified cataract: Secondary | ICD-10-CM | POA: Diagnosis not present

## 2023-06-25 DIAGNOSIS — Z604 Social exclusion and rejection: Secondary | ICD-10-CM | POA: Diagnosis not present

## 2023-06-25 DIAGNOSIS — M810 Age-related osteoporosis without current pathological fracture: Secondary | ICD-10-CM | POA: Diagnosis not present

## 2023-06-25 DIAGNOSIS — F411 Generalized anxiety disorder: Secondary | ICD-10-CM | POA: Diagnosis not present

## 2023-06-25 DIAGNOSIS — Z7901 Long term (current) use of anticoagulants: Secondary | ICD-10-CM | POA: Diagnosis not present

## 2023-06-25 DIAGNOSIS — Z7982 Long term (current) use of aspirin: Secondary | ICD-10-CM | POA: Diagnosis not present

## 2023-06-25 DIAGNOSIS — G40909 Epilepsy, unspecified, not intractable, without status epilepticus: Secondary | ICD-10-CM | POA: Diagnosis not present

## 2023-06-25 DIAGNOSIS — Z85841 Personal history of malignant neoplasm of brain: Secondary | ICD-10-CM | POA: Diagnosis not present

## 2023-06-26 DIAGNOSIS — G40909 Epilepsy, unspecified, not intractable, without status epilepticus: Secondary | ICD-10-CM | POA: Diagnosis not present

## 2023-06-26 DIAGNOSIS — I69354 Hemiplegia and hemiparesis following cerebral infarction affecting left non-dominant side: Secondary | ICD-10-CM | POA: Diagnosis not present

## 2023-06-26 DIAGNOSIS — E038 Other specified hypothyroidism: Secondary | ICD-10-CM | POA: Diagnosis not present

## 2023-06-26 DIAGNOSIS — F411 Generalized anxiety disorder: Secondary | ICD-10-CM | POA: Diagnosis not present

## 2023-06-26 DIAGNOSIS — R918 Other nonspecific abnormal finding of lung field: Secondary | ICD-10-CM | POA: Diagnosis not present

## 2023-06-26 DIAGNOSIS — E2740 Unspecified adrenocortical insufficiency: Secondary | ICD-10-CM | POA: Diagnosis not present

## 2023-06-27 ENCOUNTER — Telehealth: Payer: Self-pay | Admitting: Family Medicine

## 2023-06-27 NOTE — Telephone Encounter (Signed)
I'd like a repeat CXR. Please give verbal order to obtain.

## 2023-06-27 NOTE — Telephone Encounter (Signed)
[name] called from [facility] to report to PCP that pt is still having cough and congestion, even with taking the medication that PCP prescribed. Wants to know if PCP wants to send in a refill for pt to keep taking or advise on something else for the pt? Says they did do a chest xray which did show congestion in the lungs.

## 2023-06-28 NOTE — Telephone Encounter (Signed)
LMOVM to repeat mobile CXR

## 2023-06-30 DIAGNOSIS — Z982 Presence of cerebrospinal fluid drainage device: Secondary | ICD-10-CM | POA: Diagnosis not present

## 2023-06-30 DIAGNOSIS — R0989 Other specified symptoms and signs involving the circulatory and respiratory systems: Secondary | ICD-10-CM | POA: Diagnosis not present

## 2023-07-02 DIAGNOSIS — R918 Other nonspecific abnormal finding of lung field: Secondary | ICD-10-CM | POA: Diagnosis not present

## 2023-07-02 DIAGNOSIS — E038 Other specified hypothyroidism: Secondary | ICD-10-CM | POA: Diagnosis not present

## 2023-07-02 DIAGNOSIS — G40909 Epilepsy, unspecified, not intractable, without status epilepticus: Secondary | ICD-10-CM | POA: Diagnosis not present

## 2023-07-02 DIAGNOSIS — E2740 Unspecified adrenocortical insufficiency: Secondary | ICD-10-CM | POA: Diagnosis not present

## 2023-07-02 DIAGNOSIS — I69354 Hemiplegia and hemiparesis following cerebral infarction affecting left non-dominant side: Secondary | ICD-10-CM | POA: Diagnosis not present

## 2023-07-02 DIAGNOSIS — F411 Generalized anxiety disorder: Secondary | ICD-10-CM | POA: Diagnosis not present

## 2023-07-02 NOTE — Telephone Encounter (Signed)
CXR showed no pneumonia or ongoing bacterial infiltrates.  Sounds like he needs spirometry to reduce atelectasis and mucus management.  Recommend mucinex, humidification and hydration with water to thin mucus.

## 2023-07-02 NOTE — Telephone Encounter (Signed)
James Hoffman called from [facility] to update PCP.   Reports Bronchi throughout his lobes. Mostly when pt exhales. Vitals are good. No runny nose or fever.

## 2023-07-03 NOTE — Telephone Encounter (Signed)
Left detailed message for [facility] nurse) regarding Dr Reece Agar feedback and to call back with any further questions or concerns.

## 2023-07-06 DIAGNOSIS — I69354 Hemiplegia and hemiparesis following cerebral infarction affecting left non-dominant side: Secondary | ICD-10-CM | POA: Diagnosis not present

## 2023-07-06 DIAGNOSIS — E038 Other specified hypothyroidism: Secondary | ICD-10-CM | POA: Diagnosis not present

## 2023-07-06 DIAGNOSIS — F411 Generalized anxiety disorder: Secondary | ICD-10-CM | POA: Diagnosis not present

## 2023-07-06 DIAGNOSIS — R918 Other nonspecific abnormal finding of lung field: Secondary | ICD-10-CM | POA: Diagnosis not present

## 2023-07-06 DIAGNOSIS — E2740 Unspecified adrenocortical insufficiency: Secondary | ICD-10-CM | POA: Diagnosis not present

## 2023-07-06 DIAGNOSIS — G40909 Epilepsy, unspecified, not intractable, without status epilepticus: Secondary | ICD-10-CM | POA: Diagnosis not present

## 2023-07-09 DIAGNOSIS — E038 Other specified hypothyroidism: Secondary | ICD-10-CM | POA: Diagnosis not present

## 2023-07-09 DIAGNOSIS — R918 Other nonspecific abnormal finding of lung field: Secondary | ICD-10-CM | POA: Diagnosis not present

## 2023-07-09 DIAGNOSIS — G40909 Epilepsy, unspecified, not intractable, without status epilepticus: Secondary | ICD-10-CM | POA: Diagnosis not present

## 2023-07-09 DIAGNOSIS — F411 Generalized anxiety disorder: Secondary | ICD-10-CM | POA: Diagnosis not present

## 2023-07-09 DIAGNOSIS — E2740 Unspecified adrenocortical insufficiency: Secondary | ICD-10-CM | POA: Diagnosis not present

## 2023-07-09 DIAGNOSIS — I69354 Hemiplegia and hemiparesis following cerebral infarction affecting left non-dominant side: Secondary | ICD-10-CM | POA: Diagnosis not present

## 2023-07-09 NOTE — Telephone Encounter (Signed)
Angie called from [facility] to let PCP know that pt has done what PCP advised but is not any better. Nurse says pts O2 is at 87% and is so congested that he's weezing and nurse can hear pts heart beat, he's so fully congested. Please advise.

## 2023-07-10 NOTE — Telephone Encounter (Signed)
Please have him seen here in office.  Need to determine if inhalers are needed/ supplemental O2, etc.

## 2023-07-10 NOTE — Telephone Encounter (Signed)
Left vm for cb

## 2023-07-12 DIAGNOSIS — G40209 Localization-related (focal) (partial) symptomatic epilepsy and epileptic syndromes with complex partial seizures, not intractable, without status epilepticus: Secondary | ICD-10-CM | POA: Diagnosis not present

## 2023-07-12 DIAGNOSIS — I639 Cerebral infarction, unspecified: Secondary | ICD-10-CM | POA: Diagnosis not present

## 2023-07-12 DIAGNOSIS — Z9221 Personal history of antineoplastic chemotherapy: Secondary | ICD-10-CM | POA: Diagnosis not present

## 2023-07-12 DIAGNOSIS — Y842 Radiological procedure and radiotherapy as the cause of abnormal reaction of the patient, or of later complication, without mention of misadventure at the time of the procedure: Secondary | ICD-10-CM | POA: Diagnosis not present

## 2023-07-12 DIAGNOSIS — G40909 Epilepsy, unspecified, not intractable, without status epilepticus: Secondary | ICD-10-CM | POA: Diagnosis not present

## 2023-07-12 DIAGNOSIS — Z79899 Other long term (current) drug therapy: Secondary | ICD-10-CM | POA: Diagnosis not present

## 2023-07-12 DIAGNOSIS — G43809 Other migraine, not intractable, without status migrainosus: Secondary | ICD-10-CM | POA: Diagnosis not present

## 2023-07-12 DIAGNOSIS — F17210 Nicotine dependence, cigarettes, uncomplicated: Secondary | ICD-10-CM | POA: Diagnosis not present

## 2023-07-12 DIAGNOSIS — C716 Malignant neoplasm of cerebellum: Secondary | ICD-10-CM | POA: Diagnosis not present

## 2023-07-12 DIAGNOSIS — Z7982 Long term (current) use of aspirin: Secondary | ICD-10-CM | POA: Diagnosis not present

## 2023-07-12 DIAGNOSIS — Z85841 Personal history of malignant neoplasm of brain: Secondary | ICD-10-CM | POA: Diagnosis not present

## 2023-07-12 DIAGNOSIS — Z982 Presence of cerebrospinal fluid drainage device: Secondary | ICD-10-CM | POA: Diagnosis not present

## 2023-07-12 DIAGNOSIS — M6289 Other specified disorders of muscle: Secondary | ICD-10-CM | POA: Diagnosis not present

## 2023-07-12 NOTE — Telephone Encounter (Signed)
Spoke with angie - he has gotten worse and we both think he needs to go to the ER with his symptoms

## 2023-07-13 DIAGNOSIS — G40909 Epilepsy, unspecified, not intractable, without status epilepticus: Secondary | ICD-10-CM | POA: Diagnosis not present

## 2023-07-13 DIAGNOSIS — I69354 Hemiplegia and hemiparesis following cerebral infarction affecting left non-dominant side: Secondary | ICD-10-CM | POA: Diagnosis not present

## 2023-07-13 DIAGNOSIS — F411 Generalized anxiety disorder: Secondary | ICD-10-CM | POA: Diagnosis not present

## 2023-07-13 DIAGNOSIS — E038 Other specified hypothyroidism: Secondary | ICD-10-CM | POA: Diagnosis not present

## 2023-07-13 DIAGNOSIS — R918 Other nonspecific abnormal finding of lung field: Secondary | ICD-10-CM | POA: Diagnosis not present

## 2023-07-13 DIAGNOSIS — E2740 Unspecified adrenocortical insufficiency: Secondary | ICD-10-CM | POA: Diagnosis not present

## 2023-07-18 ENCOUNTER — Other Ambulatory Visit: Payer: Self-pay | Admitting: Family Medicine

## 2023-07-18 DIAGNOSIS — M1612 Unilateral primary osteoarthritis, left hip: Secondary | ICD-10-CM

## 2023-07-18 DIAGNOSIS — I69354 Hemiplegia and hemiparesis following cerebral infarction affecting left non-dominant side: Secondary | ICD-10-CM | POA: Diagnosis not present

## 2023-07-18 DIAGNOSIS — G40909 Epilepsy, unspecified, not intractable, without status epilepticus: Secondary | ICD-10-CM | POA: Diagnosis not present

## 2023-07-18 DIAGNOSIS — E2740 Unspecified adrenocortical insufficiency: Secondary | ICD-10-CM | POA: Diagnosis not present

## 2023-07-18 DIAGNOSIS — E038 Other specified hypothyroidism: Secondary | ICD-10-CM | POA: Diagnosis not present

## 2023-07-18 DIAGNOSIS — R918 Other nonspecific abnormal finding of lung field: Secondary | ICD-10-CM | POA: Diagnosis not present

## 2023-07-18 DIAGNOSIS — F411 Generalized anxiety disorder: Secondary | ICD-10-CM | POA: Diagnosis not present

## 2023-07-20 ENCOUNTER — Other Ambulatory Visit: Payer: Self-pay | Admitting: Family

## 2023-07-20 DIAGNOSIS — R918 Other nonspecific abnormal finding of lung field: Secondary | ICD-10-CM | POA: Diagnosis not present

## 2023-07-20 DIAGNOSIS — E038 Other specified hypothyroidism: Secondary | ICD-10-CM | POA: Diagnosis not present

## 2023-07-20 DIAGNOSIS — F411 Generalized anxiety disorder: Secondary | ICD-10-CM | POA: Diagnosis not present

## 2023-07-20 DIAGNOSIS — G40909 Epilepsy, unspecified, not intractable, without status epilepticus: Secondary | ICD-10-CM | POA: Diagnosis not present

## 2023-07-20 DIAGNOSIS — E2740 Unspecified adrenocortical insufficiency: Secondary | ICD-10-CM | POA: Diagnosis not present

## 2023-07-20 DIAGNOSIS — I69354 Hemiplegia and hemiparesis following cerebral infarction affecting left non-dominant side: Secondary | ICD-10-CM | POA: Diagnosis not present

## 2023-07-25 ENCOUNTER — Other Ambulatory Visit: Payer: Self-pay | Admitting: Family Medicine

## 2023-07-25 DIAGNOSIS — E559 Vitamin D deficiency, unspecified: Secondary | ICD-10-CM | POA: Diagnosis not present

## 2023-07-25 DIAGNOSIS — J9801 Acute bronchospasm: Secondary | ICD-10-CM

## 2023-07-25 DIAGNOSIS — K219 Gastro-esophageal reflux disease without esophagitis: Secondary | ICD-10-CM | POA: Diagnosis not present

## 2023-07-25 DIAGNOSIS — Z79899 Other long term (current) drug therapy: Secondary | ICD-10-CM | POA: Diagnosis not present

## 2023-07-25 DIAGNOSIS — G40909 Epilepsy, unspecified, not intractable, without status epilepticus: Secondary | ICD-10-CM | POA: Diagnosis not present

## 2023-07-25 DIAGNOSIS — R062 Wheezing: Secondary | ICD-10-CM

## 2023-07-25 DIAGNOSIS — Z85841 Personal history of malignant neoplasm of brain: Secondary | ICD-10-CM | POA: Diagnosis not present

## 2023-07-25 DIAGNOSIS — R918 Other nonspecific abnormal finding of lung field: Secondary | ICD-10-CM | POA: Diagnosis not present

## 2023-07-25 DIAGNOSIS — Z604 Social exclusion and rejection: Secondary | ICD-10-CM | POA: Diagnosis not present

## 2023-07-25 DIAGNOSIS — Z7952 Long term (current) use of systemic steroids: Secondary | ICD-10-CM | POA: Diagnosis not present

## 2023-07-25 DIAGNOSIS — F411 Generalized anxiety disorder: Secondary | ICD-10-CM | POA: Diagnosis not present

## 2023-07-25 DIAGNOSIS — E291 Testicular hypofunction: Secondary | ICD-10-CM | POA: Diagnosis not present

## 2023-07-25 DIAGNOSIS — M810 Age-related osteoporosis without current pathological fracture: Secondary | ICD-10-CM | POA: Diagnosis not present

## 2023-07-25 DIAGNOSIS — E2740 Unspecified adrenocortical insufficiency: Secondary | ICD-10-CM | POA: Diagnosis not present

## 2023-07-25 DIAGNOSIS — H5316 Psychophysical visual disturbances: Secondary | ICD-10-CM | POA: Diagnosis not present

## 2023-07-25 DIAGNOSIS — E038 Other specified hypothyroidism: Secondary | ICD-10-CM | POA: Diagnosis not present

## 2023-07-25 DIAGNOSIS — Z7982 Long term (current) use of aspirin: Secondary | ICD-10-CM | POA: Diagnosis not present

## 2023-07-25 DIAGNOSIS — Z7951 Long term (current) use of inhaled steroids: Secondary | ICD-10-CM | POA: Diagnosis not present

## 2023-07-25 DIAGNOSIS — Z7901 Long term (current) use of anticoagulants: Secondary | ICD-10-CM | POA: Diagnosis not present

## 2023-07-25 DIAGNOSIS — H269 Unspecified cataract: Secondary | ICD-10-CM | POA: Diagnosis not present

## 2023-07-25 DIAGNOSIS — I69354 Hemiplegia and hemiparesis following cerebral infarction affecting left non-dominant side: Secondary | ICD-10-CM | POA: Diagnosis not present

## 2023-07-25 DIAGNOSIS — G9389 Other specified disorders of brain: Secondary | ICD-10-CM | POA: Diagnosis not present

## 2023-07-25 DIAGNOSIS — H9190 Unspecified hearing loss, unspecified ear: Secondary | ICD-10-CM | POA: Diagnosis not present

## 2023-07-27 ENCOUNTER — Other Ambulatory Visit: Payer: Self-pay | Admitting: Family Medicine

## 2023-07-27 DIAGNOSIS — F323 Major depressive disorder, single episode, severe with psychotic features: Secondary | ICD-10-CM

## 2023-07-27 DIAGNOSIS — F419 Anxiety disorder, unspecified: Secondary | ICD-10-CM

## 2023-07-27 DIAGNOSIS — R443 Hallucinations, unspecified: Secondary | ICD-10-CM

## 2023-07-27 NOTE — Telephone Encounter (Signed)
Will provide 30 day supply, PCP to manage further refills.

## 2023-08-02 DIAGNOSIS — E2740 Unspecified adrenocortical insufficiency: Secondary | ICD-10-CM | POA: Diagnosis not present

## 2023-08-02 DIAGNOSIS — I69354 Hemiplegia and hemiparesis following cerebral infarction affecting left non-dominant side: Secondary | ICD-10-CM | POA: Diagnosis not present

## 2023-08-02 DIAGNOSIS — E038 Other specified hypothyroidism: Secondary | ICD-10-CM | POA: Diagnosis not present

## 2023-08-02 DIAGNOSIS — F411 Generalized anxiety disorder: Secondary | ICD-10-CM | POA: Diagnosis not present

## 2023-08-02 DIAGNOSIS — G40909 Epilepsy, unspecified, not intractable, without status epilepticus: Secondary | ICD-10-CM | POA: Diagnosis not present

## 2023-08-02 DIAGNOSIS — R918 Other nonspecific abnormal finding of lung field: Secondary | ICD-10-CM | POA: Diagnosis not present

## 2023-08-10 ENCOUNTER — Ambulatory Visit (INDEPENDENT_AMBULATORY_CARE_PROVIDER_SITE_OTHER): Payer: Medicare Other

## 2023-08-10 DIAGNOSIS — R918 Other nonspecific abnormal finding of lung field: Secondary | ICD-10-CM

## 2023-08-10 DIAGNOSIS — H9190 Unspecified hearing loss, unspecified ear: Secondary | ICD-10-CM | POA: Diagnosis not present

## 2023-08-10 DIAGNOSIS — H5316 Psychophysical visual disturbances: Secondary | ICD-10-CM | POA: Diagnosis not present

## 2023-08-10 DIAGNOSIS — M810 Age-related osteoporosis without current pathological fracture: Secondary | ICD-10-CM | POA: Diagnosis not present

## 2023-08-10 DIAGNOSIS — G40909 Epilepsy, unspecified, not intractable, without status epilepticus: Secondary | ICD-10-CM

## 2023-08-10 DIAGNOSIS — I69354 Hemiplegia and hemiparesis following cerebral infarction affecting left non-dominant side: Secondary | ICD-10-CM

## 2023-08-10 DIAGNOSIS — E038 Other specified hypothyroidism: Secondary | ICD-10-CM | POA: Diagnosis not present

## 2023-08-10 DIAGNOSIS — E2740 Unspecified adrenocortical insufficiency: Secondary | ICD-10-CM | POA: Diagnosis not present

## 2023-08-10 DIAGNOSIS — G9389 Other specified disorders of brain: Secondary | ICD-10-CM

## 2023-08-10 DIAGNOSIS — H269 Unspecified cataract: Secondary | ICD-10-CM

## 2023-08-10 DIAGNOSIS — F411 Generalized anxiety disorder: Secondary | ICD-10-CM

## 2023-08-10 DIAGNOSIS — E291 Testicular hypofunction: Secondary | ICD-10-CM

## 2023-08-13 DIAGNOSIS — G40909 Epilepsy, unspecified, not intractable, without status epilepticus: Secondary | ICD-10-CM | POA: Diagnosis not present

## 2023-08-13 DIAGNOSIS — R918 Other nonspecific abnormal finding of lung field: Secondary | ICD-10-CM | POA: Diagnosis not present

## 2023-08-13 DIAGNOSIS — F411 Generalized anxiety disorder: Secondary | ICD-10-CM | POA: Diagnosis not present

## 2023-08-13 DIAGNOSIS — I69354 Hemiplegia and hemiparesis following cerebral infarction affecting left non-dominant side: Secondary | ICD-10-CM | POA: Diagnosis not present

## 2023-08-13 DIAGNOSIS — E2740 Unspecified adrenocortical insufficiency: Secondary | ICD-10-CM | POA: Diagnosis not present

## 2023-08-13 DIAGNOSIS — E038 Other specified hypothyroidism: Secondary | ICD-10-CM | POA: Diagnosis not present

## 2023-08-18 ENCOUNTER — Other Ambulatory Visit: Payer: Self-pay | Admitting: Family Medicine

## 2023-08-18 DIAGNOSIS — F323 Major depressive disorder, single episode, severe with psychotic features: Secondary | ICD-10-CM

## 2023-08-18 DIAGNOSIS — R443 Hallucinations, unspecified: Secondary | ICD-10-CM

## 2023-08-18 DIAGNOSIS — F419 Anxiety disorder, unspecified: Secondary | ICD-10-CM

## 2023-08-20 DIAGNOSIS — R918 Other nonspecific abnormal finding of lung field: Secondary | ICD-10-CM | POA: Diagnosis not present

## 2023-08-20 DIAGNOSIS — E2740 Unspecified adrenocortical insufficiency: Secondary | ICD-10-CM | POA: Diagnosis not present

## 2023-08-20 DIAGNOSIS — E038 Other specified hypothyroidism: Secondary | ICD-10-CM | POA: Diagnosis not present

## 2023-08-20 DIAGNOSIS — I69354 Hemiplegia and hemiparesis following cerebral infarction affecting left non-dominant side: Secondary | ICD-10-CM | POA: Diagnosis not present

## 2023-08-20 DIAGNOSIS — G40909 Epilepsy, unspecified, not intractable, without status epilepticus: Secondary | ICD-10-CM | POA: Diagnosis not present

## 2023-08-20 DIAGNOSIS — F411 Generalized anxiety disorder: Secondary | ICD-10-CM | POA: Diagnosis not present

## 2023-09-04 ENCOUNTER — Encounter: Payer: Self-pay | Admitting: Family Medicine

## 2023-09-04 ENCOUNTER — Ambulatory Visit (INDEPENDENT_AMBULATORY_CARE_PROVIDER_SITE_OTHER): Payer: Medicare Other | Admitting: Family Medicine

## 2023-09-04 VITALS — BP 121/86 | HR 94 | Temp 98.5°F | Ht 71.0 in

## 2023-09-04 DIAGNOSIS — N39498 Other specified urinary incontinence: Secondary | ICD-10-CM | POA: Diagnosis not present

## 2023-09-04 DIAGNOSIS — R159 Full incontinence of feces: Secondary | ICD-10-CM | POA: Diagnosis not present

## 2023-09-04 DIAGNOSIS — R441 Visual hallucinations: Secondary | ICD-10-CM

## 2023-09-04 DIAGNOSIS — Z7409 Other reduced mobility: Secondary | ICD-10-CM | POA: Diagnosis not present

## 2023-09-04 DIAGNOSIS — Z8673 Personal history of transient ischemic attack (TIA), and cerebral infarction without residual deficits: Secondary | ICD-10-CM | POA: Diagnosis not present

## 2023-09-04 NOTE — Progress Notes (Signed)
Subjective: CC: Chronic follow-up, hallucinations PCP: Raliegh Ip, DO James Hoffman is a 27 y.o. male presenting to clinic today for:  1.  History of CVA with dementia and hallucinations Patient is brought to the office by his grandmom.  She notes that sadly his mentation has been worsening over the last several months.  She still feels equipped to continue taking care of him at home and really wants avoid any placement.  She reports no combative or abusive behaviors.  His biggest concern is he is constantly afraid that some type of gang is after him.  He continues to take Seroquel that was recommended by specialist but refilled by this office, BuSpar.  The BuSpar does seem to help relax him.  She is also been giving him muscle relaxers as needed and again this seems to calm him down.  However she tries to avoid excessive use of this because she does not want him to be sleeping all day.  Sadly, physical therapy had really nothing else to add.  He remains pretty confined to a wheelchair and does not stand most days.  He has had no falls.   ROS: Per HPI  Allergies  Allergen Reactions   Tramadol Anaphylaxis   Morphine Nausea And Vomiting    According to mother the patient is intolerant of morphine; causes nausea and vomiting.  Has had none since he was 27yo.   Past Medical History:  Diagnosis Date   'Light-for-dates' infant with signs of fetal malnutrition 02/11/2021   Acute respiratory failure with hypoxia (HCC) 03/20/2018   Last Assessment & Plan:   Formatting of this note might be different from the original.  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  Patient w/ ongoing  agitation/restlessness that is worse than baseline per family. Re-engage w/ Neurology for repeat EEG w/ LTM, may consider LP as well. Per Dr. Burna Forts he does not feel this is likely malignancy, less likely infectious, but may be due to seizure activity or inflammatory process.   Hydrocephalus (HCC)    Osteoporosis    Sepsis (HCC) 09/09/2020   Last Assessment & Plan:   Formatting of this note might be different from the original.  2/2 aspiration pneumonia, c/b encephalopathy.  -Encephalopathy has improved but patient still not back to baseline per family.  Further w/u as noted  -F/u w/ Dr. Burna Forts in 4-6 weeks.  -Completed abx in-house   Thyroid disease     Current Outpatient Medications:    albuterol (VENTOLIN HFA) 108 (90 Base) MCG/ACT inhaler, TAKE 2 PUFFS BY MOUTH EVERY 6 HOURS AS NEEDED FOR WHEEZE OR SHORTNESS OF BREATH, Disp: 18 each, Rfl: 0   atorvastatin (LIPITOR) 40 MG tablet, TAKE 1 TABLET BY MOUTH EVERYDAY AT BEDTIME, Disp: 90 tablet, Rfl: 0   baclofen (LIORESAL) 10 MG tablet, TAKE 1 TABLET BY MOUTH 3 TIMES A DAY AS NEEDED FOR MUSCLE SPASMS - PAIN MANAGEMENT TO TAKE OVER REFILLS, Disp: 270 tablet, Rfl: 1   busPIRone (BUSPAR) 10 MG tablet, Take 1 tablet (10 mg total) by mouth 3 (three) times daily. For Anxiety, Disp: 270 tablet, Rfl: 1   celecoxib (CELEBREX) 50 MG capsule, TAKE  1 CAPSULE (50 MG TOTAL) BY MOUTH 2 (TWO) TIMES DAILY AS NEEDED FOR PAIN., Disp: 180 capsule, Rfl: 0   cetirizine (ZYRTEC) 10 MG tablet, Take 1 tablet (10 mg total) by mouth daily., Disp: 90 tablet, Rfl: 3   Cholecalciferol 50 MCG (2000 UT) CAPS, Take by mouth., Disp: , Rfl:    clotrimazole-betamethasone (LOTRISONE) cream, APPLY 1 APPLICATION TOPICALLY 2 (TWO) TIMES DAILY. X 7-10 DAYS, Disp: 30 g, Rfl: 0   fluticasone (FLONASE) 50 MCG/ACT nasal spray, SPRAY 2 SPRAYS INTO EACH NOSTRIL EVERY DAY, Disp: 48 mL, Rfl: 1   Fluticasone Furoate (ARNUITY ELLIPTA) 100 MCG/ACT AEPB, Inhale 1 puff into the lungs daily., Disp: 30  each, Rfl: 12   levETIRAcetam (KEPPRA) 750 MG tablet, Take 1,500 mg by mouth in the morning and at bedtime., Disp: , Rfl:    levothyroxine (SYNTHROID) 88 MCG tablet, Take 88 mcg by mouth daily before breakfast., Disp: , Rfl:    omeprazole (PRILOSEC) 20 MG capsule, TAKE 1 CAPSULE (20 MG TOTAL) BY MOUTH DAILY. AT LUNCH TIME, Disp: 90 capsule, Rfl: 3   potassium chloride SA (KLOR-CON M) 20 MEQ tablet, Take 1 tablet (20 mEq total) by mouth 2 (two) times daily., Disp: 30 tablet, Rfl: 2   prednisoLONE 5 MG TABS tablet, Take by mouth., Disp: , Rfl:    QUEtiapine (SEROQUEL) 25 MG tablet, TAKE 1 TABLET BY MOUTH EVERYDAY AT BEDTIME, Disp: 90 tablet, Rfl: 1   Spacer/Aero-Holding Chambers (AEROCHAMBER PLUS) inhaler, Use as instructed, Disp: 1 each, Rfl: 2   Testosterone 20.25 MG/ACT (1.62%) GEL, ONE PUM ON EACH SHOULDER EVERY MORNIG, Disp: 75 g, Rfl: 1 Social History   Socioeconomic History   Marital status: Single    Spouse name: Not on file   Number of children: 0   Years of education: 7th grade   Highest education level: 7th grade  Occupational History   Occupation: disabled  Tobacco Use   Smoking status: Every Day    Current packs/day: 0.25    Average packs/day: 0.3 packs/day for 10.8 years (2.7 ttl pk-yrs)    Types: Cigarettes    Start date: 11/25/2012   Smokeless tobacco: Never  Vaping Use   Vaping status: Never Used  Substance and Sexual Activity   Alcohol use: No   Drug use: No   Sexual activity: Not Currently  Other Topics Concern   Not on file  Social History Narrative   Lives with his grandmother   Mostly wheelchair bound   Sometimes he uses walker when knees aren't hurt   Mostly dependent on others for assistance - eats on his own, but can't prepare meals, has to have help in and out of shower, but bathes himself, etc.   Social Determinants of Health   Financial Resource Strain: Low Risk  (02/20/2023)   Overall Financial Resource Strain (CARDIA)    Difficulty of Paying Living  Expenses: Not hard at all  Food Insecurity: Low Risk  (04/24/2023)   [facility]   Hunger Vital Sign    Worried About Running Out of Food in the Last Year: Never true    Ran Out of Food in the Last Year: Never true  Transportation Needs: Not on file (06/13/2023)  Physical Activity: Inactive (02/20/2023)   Exercise Vital Sign    Days of Exercise per Week: 0 days    Minutes of Exercise per Session: 0 min  Stress: No Stress Concern Present (02/20/2023)   [employer] of Occupational Health - Occupational  Stress Questionnaire    Feeling of Stress : Not at all  Social Connections: Socially Isolated (02/20/2023)   Social Connection and Isolation Panel [NHANES]    Frequency of Communication with Friends and Family: More than three times a week    Frequency of Social Gatherings with Friends and Family: More than three times a week    Attends Religious Services: Never    Database administrator or Organizations: No    Attends Banker Meetings: Never    Marital Status: Never married  Intimate Partner Violence: Not At Risk (02/20/2023)   Humiliation, Afraid, Rape, and Kick questionnaire    Fear of Current or Ex-Partner: No    Emotionally Abused: No    Physically Abused: No    Sexually Abused: No   History reviewed. No pertinent family history.  Objective: Office vital signs reviewed. BP 121/86   Pulse 94   Temp 98.5 F (36.9 C)   Ht 5\' 11"  (1.803 m)   SpO2 96%   BMI 20.22 kg/m   Physical Examination:  General: Awake, alert, thin, No acute distress HEENT: sclera white Cardio: regular rate and rhythm, S1S2 heard, no murmurs appreciated Pulm: clear to auscultation bilaterally, no wheezes, rhonchi or rales; normal work of breathing on room air Psych: Having active hallucinations and paranoia about gangs here in office today MSK: Arrives in wheelchair.  Muscle wasting present.  Able to move extremities independently  Assessment/ Plan: 28 y.o.  male   Other urinary incontinence - Plan: Incontinence supply  Incontinence of feces, unspecified fecal incontinence type - Plan: Incontinence supply  History of cerebrovascular accident (CVA) in adulthood - Plan: Incontinence supply  Impaired mobility and endurance - Plan: Incontinence supply  Continuous visual hallucinations - Plan: Incontinence supply  I ordered incontinence supplies for him.  Grandmother was requesting wipes.  Due to his impaired mobility she is pretty much having to take care of him to reduce falls.  Sadly he continues to have further decline and continues to suffer from visual and auditory hallucinations.  I think this has a lot to do with his impaired cognition in the setting of previous brain injury/brain surgery.  I encouraged her to continue supplementing his meals with protein shakes etc. to help with caloric intake.   Raliegh Ip, DO [facility] [phone]

## 2023-09-13 DIAGNOSIS — E2749 Other adrenocortical insufficiency: Secondary | ICD-10-CM | POA: Diagnosis not present

## 2023-09-13 DIAGNOSIS — E291 Testicular hypofunction: Secondary | ICD-10-CM | POA: Diagnosis not present

## 2023-09-13 DIAGNOSIS — E038 Other specified hypothyroidism: Secondary | ICD-10-CM | POA: Diagnosis not present

## 2023-09-13 DIAGNOSIS — E230 Hypopituitarism: Secondary | ICD-10-CM | POA: Diagnosis not present

## 2023-09-16 ENCOUNTER — Other Ambulatory Visit: Payer: Self-pay | Admitting: Family Medicine

## 2023-09-16 DIAGNOSIS — F419 Anxiety disorder, unspecified: Secondary | ICD-10-CM

## 2023-09-27 ENCOUNTER — Other Ambulatory Visit: Payer: Self-pay | Admitting: Family Medicine

## 2023-09-27 DIAGNOSIS — M1612 Unilateral primary osteoarthritis, left hip: Secondary | ICD-10-CM

## 2023-09-27 DIAGNOSIS — Z7409 Other reduced mobility: Secondary | ICD-10-CM

## 2023-09-27 DIAGNOSIS — M25462 Effusion, left knee: Secondary | ICD-10-CM

## 2023-10-16 ENCOUNTER — Other Ambulatory Visit: Payer: Self-pay | Admitting: Family Medicine

## 2023-10-16 DIAGNOSIS — K219 Gastro-esophageal reflux disease without esophagitis: Secondary | ICD-10-CM

## 2023-10-16 DIAGNOSIS — M1612 Unilateral primary osteoarthritis, left hip: Secondary | ICD-10-CM

## 2023-10-16 DIAGNOSIS — R10826 Epigastric rebound abdominal tenderness: Secondary | ICD-10-CM

## 2023-11-02 ENCOUNTER — Ambulatory Visit: Payer: Self-pay | Admitting: Family Medicine

## 2023-11-02 ENCOUNTER — Other Ambulatory Visit: Payer: Self-pay | Admitting: Family Medicine

## 2023-11-02 NOTE — Telephone Encounter (Signed)
Prescription request had been enter for Robitussin. Patient doesn't have an active prescription. 3 attempts to call phone number on chart with no success. Voicemail was full and unable to leave messages.

## 2023-11-02 NOTE — Telephone Encounter (Signed)
Copied from CRM [id number]. Topic: Clinical - Medication Refill >> Nov 02, 2023  4:13 PM Antony Haste wrote: Most Recent Primary Care Visit:  Provider: Raliegh Ip  Department: [facility]  Visit Type: OFFICE VISIT  Date: 09/04/2023  Medication: Cough Syrup, PT's states he is spitting phlegm.  Has the patient contacted their pharmacy? Yes (Agent: If no, request that the patient contact the pharmacy for the refill. If patient does not wish to contact the pharmacy document the reason why and proceed with request.) (Agent: If yes, when and what did the pharmacy advise?)  Is this the correct pharmacy for this prescription? Yes If no, delete pharmacy and type the correct one.  This is the patient's preferred pharmacy:  [facility] [phone] - [place], [place] - [address] [place] [address] Phone: [phone] Fax: [phone]   Has the prescription been filled recently? No  Is the patient out of the medication? Yes  Has the patient been seen for an appointment in the last year OR does the patient have an upcoming appointment? No  Can we respond through MyChart? No  Agent: Please be advised that Rx refills may take up to 3 business days. We ask that you follow-up with your pharmacy.

## 2023-11-05 MED ORDER — GUAIFENESIN 100 MG/5ML PO SYRP: 200.0000 mg | ORAL_SOLUTION | Freq: Three times a day (TID) | ORAL | 1 refills | Status: DC | PRN

## 2023-11-17 ENCOUNTER — Other Ambulatory Visit: Payer: Self-pay | Admitting: Family Medicine

## 2023-11-17 DIAGNOSIS — B9689 Other specified bacterial agents as the cause of diseases classified elsewhere: Secondary | ICD-10-CM

## 2023-12-14 ENCOUNTER — Ambulatory Visit: Payer: Self-pay | Admitting: Family Medicine

## 2023-12-14 NOTE — Telephone Encounter (Signed)
  Chief Complaint: low appetite  Symptoms: sleeping for much of the day, low appetite Frequency: today Pertinent Negatives: Patient denies fever, SOB, CP Disposition: [] ED /[] Urgent Care (no appt availability in office) / [x] Appointment(In office/virtual)/ []  [facility]/ [] Home Care/ [] Refused Recommended Disposition /[] [facility]/ []  Follow-up with PCP Additional Notes: patient's grandmother called with concerns that patient is not wanting to eat a lot today. She states patient has eaten two apple sauces and just wants to sleep.  States patient isn't complaining of anything or acting differently. Per protocol, appointment made for 12/17/2023 at 10:50 am. Grandmother verbalized understanding of the plan and all questions answered.   Copied from CRM [id number]. Topic: Clinical - Red Word Triage >> Dec 14, 2023  4:54 PM Shelah Lewandowsky wrote: Red Word that prompted transfer to Nurse Triage: patient refusing to eat, only ate 2 applesauces today, sleeping all day Reason for Disposition  [1] Fatigue (i.e., tires easily, decreased energy) AND [2] persists > 1 week  Answer Assessment - Initial Assessment Questions 1. DESCRIPTION: "Describe how you are feeling."     Grandmother reports that patient isn't eating like normal today 2. SEVERITY: "How bad is it?"  "Can you stand and walk?"   - MILD (0-3): Feels weak or tired, but does not interfere with work, school or normal activities.   - MODERATE (4-7): Able to stand and walk; weakness interferes with work, school, or normal activities.   - SEVERE (8-10): Unable to stand or walk; unable to do usual activities.     Difficulty to quantify as patient is a complete care 3. ONSET: "When did these symptoms begin?" (e.g., hours, days, weeks, months)     today 4. CAUSE: "What do you think is causing the weakness or fatigue?" (e.g., not drinking enough fluids, medical problem, trouble sleeping)     unsure 5. NEW MEDICINES:  "Have you started  on any new medicines recently?" (e.g., opioid pain medicines, benzodiazepines, muscle relaxants, antidepressants, antihistamines, neuroleptics, beta blockers)     No new medications 6. OTHER SYMPTOMS: "Do you have any other symptoms?" (e.g., chest pain, fever, cough, SOB, vomiting, diarrhea, bleeding, other areas of pain)     No other symptoms  Protocols used: Weakness (Generalized) and Fatigue-A-AH

## 2023-12-17 ENCOUNTER — Encounter: Payer: Self-pay | Admitting: Family Medicine

## 2023-12-17 ENCOUNTER — Ambulatory Visit (INDEPENDENT_AMBULATORY_CARE_PROVIDER_SITE_OTHER): Payer: Medicare Other

## 2023-12-17 VITALS — BP 78/58 | HR 220 | Ht 71.0 in

## 2023-12-17 DIAGNOSIS — R630 Anorexia: Secondary | ICD-10-CM | POA: Diagnosis not present

## 2023-12-17 DIAGNOSIS — B370 Candidal stomatitis: Secondary | ICD-10-CM

## 2023-12-17 MED ORDER — DRONABINOL 2.5 MG PO CAPS: 2.5000 mg | ORAL_CAPSULE | Freq: Two times a day (BID) | ORAL | 1 refills | Status: DC

## 2023-12-17 MED ORDER — NYSTATIN 100000 UNIT/ML MT SUSP: 5.0000 mL | Freq: Four times a day (QID) | OROMUCOSAL | 0 refills | Status: DC

## 2023-12-17 NOTE — Progress Notes (Signed)
BP (!) 78/58   Pulse (!) 220   Ht 5\' 11"  (1.803 m)   SpO2 96%   BMI 20.22 kg/m    Subjective:   Patient ID: James Hoffman, male    DOB: 1996/09/11, 28 y.o.   MRN: [id number]  HPI: James Hoffman is a 28 y.o. male presenting on 12/17/2023 for Anorexia and Thrush   HPI Over the past month they have noticed that his appetite is gone down and she is trying to give him Ensure but he is not sleeping more and his memory has been down.  She also is concerned that over the past few weeks she has noticed he has thrush in his mouth and thinks it may be a possibility when his appetite is going down.  She wants to try something for appetite stimulant to see if it is helping.  His memory has been worsening.  He gets very agitated and situations at home 2.  He has significant mental handicap and memory dysfunction secondary to brain tumor and hydrocephalus and damage and his caretaker is here with him says that is just been getting worse recently to where it is affecting his appetite as well and now affecting his fluid intake as well.  She says he was having good fluid intake for the most part up until he started getting a thrush and now he does not want to drink things as much.  Relevant past medical, surgical, family and social history reviewed and updated as indicated. Interim medical history since our last visit reviewed. Allergies and medications reviewed and updated.  Review of Systems  Constitutional:  Positive for appetite change. Negative for chills and fever.  HENT:  Positive for mouth sores.   Eyes:  Negative for visual disturbance.  Respiratory:  Negative for shortness of breath and wheezing.   Cardiovascular:  Negative for chest pain and leg swelling.  Musculoskeletal:  Negative for back pain and gait problem.  Skin:  Negative for rash.  All other systems reviewed and are negative.   Per HPI unless specifically indicated above   Allergies as of 12/17/2023       Reactions    Tramadol Anaphylaxis   Morphine Nausea And Vomiting   According to mother the patient is intolerant of morphine; causes nausea and vomiting.  Has had none since he was 28yo.        Medication List        Accurate as of December 17, 2023 11:36 AM. If you have any questions, ask your nurse or doctor.          AeroChamber Plus inhaler Use as instructed   Arnuity Ellipta 100 MCG/ACT Aepb Generic drug: Fluticasone Furoate Inhale 1 puff into the lungs daily.   atorvastatin 40 MG tablet Commonly known as: LIPITOR TAKE 1 TABLET BY MOUTH EVERYDAY AT BEDTIME   baclofen 10 MG tablet Commonly known as: LIORESAL TAKE 1 TAB BY MOUTH 3 TIMES A DAY AS NEEDED FOR MUSCLE SPASMS. PAIN MANAGEMENT TO TAKE OVER REFILLS   busPIRone 10 MG tablet Commonly known as: BUSPAR TAKE 1 TABLET (10 MG TOTAL) BY MOUTH 3 (THREE) TIMES DAILY. FOR ANXIETY   celecoxib 50 MG capsule Commonly known as: CELEBREX TAKE 1 CAPSULE (50 MG TOTAL) BY MOUTH 2 (TWO) TIMES DAILY AS NEEDED FOR PAIN.   cetirizine 10 MG tablet Commonly known as: ZYRTEC Take 1 tablet (10 mg total) by mouth daily.   Cholecalciferol 50 MCG (2000 UT) Caps Take by mouth.  clotrimazole-betamethasone cream Commonly known as: LOTRISONE APPLY 1 APPLICATION TOPICALLY 2 (TWO) TIMES DAILY. X 7-10 DAYS   dronabinol 2.5 MG capsule Commonly known as: MARINOL Take 1 capsule (2.5 mg total) by mouth 2 (two) times daily before a meal. Started by: Elige Radon Harvie Morua   fluticasone 50 MCG/ACT nasal spray Commonly known as: FLONASE SPRAY 2 SPRAYS INTO EACH NOSTRIL EVERY DAY   guaifenesin 100 MG/5ML syrup Commonly known as: ROBITUSSIN Take 10 mLs (200 mg total) by mouth 3 (three) times daily as needed for cough.   levETIRAcetam 750 MG tablet Commonly known as: KEPPRA Take 1,500 mg by mouth in the morning and at bedtime.   levothyroxine 88 MCG tablet Commonly known as: SYNTHROID Take 88 mcg by mouth daily before breakfast.   nystatin 100000  UNIT/ML suspension Commonly known as: MYCOSTATIN Take 5 mLs (500,000 Units total) by mouth 4 (four) times daily. Started by: Elige Radon Nathon Stefanski   omeprazole 20 MG capsule Commonly known as: PRILOSEC TAKE 1 CAPSULE (20 MG TOTAL) BY MOUTH DAILY. AT LUNCH TIME   potassium chloride SA 20 MEQ tablet Commonly known as: KLOR-CON M Take 1 tablet (20 mEq total) by mouth 2 (two) times daily.   prednisoLONE 5 MG Tabs tablet Take by mouth.   QUEtiapine 25 MG tablet Commonly known as: SEROQUEL TAKE 1 TABLET BY MOUTH EVERYDAY AT BEDTIME   Testosterone 20.25 MG/ACT (1.62%) Gel ONE PUM ON EACH SHOULDER EVERY MORNIG   Ventolin HFA 108 (90 Base) MCG/ACT inhaler Generic drug: albuterol TAKE 2 PUFFS BY MOUTH EVERY 6 HOURS AS NEEDED FOR WHEEZE OR SHORTNESS OF BREATH         Objective:   BP (!) 78/58   Pulse (!) 220   Ht 5\' 11"  (1.803 m)   SpO2 96%   BMI 20.22 kg/m   Wt Readings from Last 3 Encounters:  05/22/23 145 lb (65.8 kg)  02/20/23 130 lb (59 kg)  09/11/22 124 lb (56.2 kg)    Physical Exam Vitals and nursing note reviewed.  Constitutional:      General: He is not in acute distress.    Appearance: He is well-developed. He is not diaphoretic.  Eyes:     General: No scleral icterus.    Conjunctiva/sclera: Conjunctivae normal.  Neck:     Thyroid: No thyromegaly.  Cardiovascular:     Rate and Rhythm: Normal rate and regular rhythm.     Heart sounds: Normal heart sounds. No murmur heard. Pulmonary:     Effort: Pulmonary effort is normal. No respiratory distress.     Breath sounds: Normal breath sounds. No wheezing.  Musculoskeletal:     Cervical back: Neck supple.  Lymphadenopathy:     Cervical: No cervical adenopathy.  Skin:    General: Skin is warm and dry.     Findings: No rash.  Neurological:     Mental Status: He is alert and oriented to person, place, and time.     Coordination: Coordination normal.  Psychiatric:        Behavior: Behavior normal.        Assessment & Plan:   Problem List Items Addressed This Visit   None Visit Diagnoses       Thrush    -  Primary   Relevant Medications   nystatin (MYCOSTATIN) 100000 UNIT/ML suspension     Anorexia       Relevant Medications   dronabinol (MARINOL) 2.5 MG capsule       Patient is very agitated today,  looks like he normally has low blood pressure tachycardia heart rate although it is slightly higher than that today but he is very agitated with just says he wants to go home.  Recommended dronabinol for appetite stimulant and gave nystatin for thrush. Follow up plan: Return if symptoms worsen or fail to improve.  Counseling provided for all of the vaccine components No orders of the defined types were placed in this encounter.   Arville Care, MD [facility] 12/17/2023, 11:36 AM

## 2023-12-17 NOTE — Telephone Encounter (Signed)
Appt today on [facility]

## 2023-12-18 ENCOUNTER — Telehealth: Payer: Self-pay | Admitting: Family Medicine

## 2023-12-18 NOTE — Telephone Encounter (Signed)
Called the pharmacy and spoke with staff they received the medications but one was out of stoke and they had to order it. Left message for patient to call back

## 2023-12-18 NOTE — Telephone Encounter (Signed)
 Copied from CRM [id number]. Topic: Clinical - Prescription Issue >> Dec 18, 2023  1:29 PM Whitney O wrote: Reason for CRM: patient grandmother shona   is calling because he was in and seen [facility] and they sent in medication and they say they only sent the one to help him eat. Patient grandmother says she went to pharmacy and they only had one and told her to call the doctor . I do see both meds where sent to [facility] pharmacy and confirmed but the pharmacy is saying otherwise nystatin  (MYCOSTATIN ) 100000 UNIT/ML suspension [[id number]] they didn't have and the other one they had to order . Patient is needing doctor to call pharmacy to see what's going on with the medication that they dont have  [id number]

## 2023-12-19 NOTE — Telephone Encounter (Signed)
 Grandma informed. LS

## 2023-12-20 DIAGNOSIS — J90 Pleural effusion, not elsewhere classified: Secondary | ICD-10-CM | POA: Diagnosis not present

## 2023-12-20 DIAGNOSIS — R4182 Altered mental status, unspecified: Secondary | ICD-10-CM | POA: Diagnosis not present

## 2023-12-20 DIAGNOSIS — E872 Acidosis, unspecified: Secondary | ICD-10-CM | POA: Diagnosis not present

## 2023-12-20 DIAGNOSIS — A419 Sepsis, unspecified organism: Secondary | ICD-10-CM | POA: Diagnosis not present

## 2023-12-20 DIAGNOSIS — R188 Other ascites: Secondary | ICD-10-CM | POA: Diagnosis not present

## 2023-12-20 DIAGNOSIS — I5021 Acute systolic (congestive) heart failure: Secondary | ICD-10-CM | POA: Diagnosis not present

## 2023-12-20 DIAGNOSIS — I5020 Unspecified systolic (congestive) heart failure: Secondary | ICD-10-CM | POA: Diagnosis not present

## 2023-12-20 DIAGNOSIS — J13 Pneumonia due to Streptococcus pneumoniae: Secondary | ICD-10-CM | POA: Diagnosis not present

## 2023-12-20 DIAGNOSIS — R000 Tachycardia, unspecified: Secondary | ICD-10-CM | POA: Diagnosis not present

## 2023-12-20 DIAGNOSIS — K824 Cholesterolosis of gallbladder: Secondary | ICD-10-CM | POA: Diagnosis not present

## 2023-12-20 DIAGNOSIS — J810 Acute pulmonary edema: Secondary | ICD-10-CM | POA: Diagnosis not present

## 2023-12-20 DIAGNOSIS — I50810 Right heart failure, unspecified: Secondary | ICD-10-CM | POA: Diagnosis not present

## 2023-12-20 DIAGNOSIS — J1008 Influenza due to other identified influenza virus with other specified pneumonia: Secondary | ICD-10-CM | POA: Diagnosis not present

## 2023-12-20 DIAGNOSIS — A4181 Sepsis due to Enterococcus: Secondary | ICD-10-CM | POA: Diagnosis not present

## 2023-12-20 DIAGNOSIS — J9601 Acute respiratory failure with hypoxia: Secondary | ICD-10-CM | POA: Diagnosis not present

## 2023-12-20 DIAGNOSIS — B9689 Other specified bacterial agents as the cause of diseases classified elsewhere: Secondary | ICD-10-CM | POA: Diagnosis not present

## 2023-12-20 DIAGNOSIS — I21A1 Myocardial infarction type 2: Secondary | ICD-10-CM | POA: Diagnosis not present

## 2023-12-20 DIAGNOSIS — I130 Hypertensive heart and chronic kidney disease with heart failure and stage 1 through stage 4 chronic kidney disease, or unspecified chronic kidney disease: Secondary | ICD-10-CM | POA: Diagnosis not present

## 2023-12-20 DIAGNOSIS — I2699 Other pulmonary embolism without acute cor pulmonale: Secondary | ICD-10-CM | POA: Diagnosis not present

## 2023-12-20 DIAGNOSIS — N189 Chronic kidney disease, unspecified: Secondary | ICD-10-CM | POA: Diagnosis not present

## 2023-12-20 DIAGNOSIS — I639 Cerebral infarction, unspecified: Secondary | ICD-10-CM | POA: Diagnosis not present

## 2023-12-20 DIAGNOSIS — E870 Hyperosmolality and hypernatremia: Secondary | ICD-10-CM | POA: Diagnosis not present

## 2023-12-20 DIAGNOSIS — R0902 Hypoxemia: Secondary | ICD-10-CM | POA: Diagnosis not present

## 2023-12-20 DIAGNOSIS — Z9981 Dependence on supplemental oxygen: Secondary | ICD-10-CM | POA: Diagnosis not present

## 2023-12-20 DIAGNOSIS — D62 Acute posthemorrhagic anemia: Secondary | ICD-10-CM | POA: Diagnosis not present

## 2023-12-20 DIAGNOSIS — B370 Candidal stomatitis: Secondary | ICD-10-CM | POA: Diagnosis not present

## 2023-12-20 DIAGNOSIS — E039 Hypothyroidism, unspecified: Secondary | ICD-10-CM | POA: Diagnosis not present

## 2023-12-20 DIAGNOSIS — J181 Lobar pneumonia, unspecified organism: Secondary | ICD-10-CM | POA: Diagnosis not present

## 2023-12-20 DIAGNOSIS — R0689 Other abnormalities of breathing: Secondary | ICD-10-CM | POA: Diagnosis not present

## 2023-12-20 DIAGNOSIS — B957 Other staphylococcus as the cause of diseases classified elsewhere: Secondary | ICD-10-CM | POA: Diagnosis not present

## 2023-12-20 DIAGNOSIS — R404 Transient alteration of awareness: Secondary | ICD-10-CM | POA: Diagnosis not present

## 2023-12-20 DIAGNOSIS — R791 Abnormal coagulation profile: Secondary | ICD-10-CM | POA: Diagnosis not present

## 2023-12-20 DIAGNOSIS — E2749 Other adrenocortical insufficiency: Secondary | ICD-10-CM | POA: Diagnosis not present

## 2023-12-20 DIAGNOSIS — R7881 Bacteremia: Secondary | ICD-10-CM | POA: Diagnosis not present

## 2023-12-20 DIAGNOSIS — Z681 Body mass index (BMI) 19 or less, adult: Secondary | ICD-10-CM | POA: Diagnosis not present

## 2023-12-20 DIAGNOSIS — Z743 Need for continuous supervision: Secondary | ICD-10-CM | POA: Diagnosis not present

## 2023-12-20 DIAGNOSIS — I514 Myocarditis, unspecified: Secondary | ICD-10-CM | POA: Diagnosis not present

## 2023-12-20 DIAGNOSIS — Z66 Do not resuscitate: Secondary | ICD-10-CM | POA: Diagnosis not present

## 2023-12-20 DIAGNOSIS — M6281 Muscle weakness (generalized): Secondary | ICD-10-CM | POA: Diagnosis not present

## 2023-12-20 DIAGNOSIS — Z4659 Encounter for fitting and adjustment of other gastrointestinal appliance and device: Secondary | ICD-10-CM | POA: Diagnosis not present

## 2023-12-20 DIAGNOSIS — Z982 Presence of cerebrospinal fluid drainage device: Secondary | ICD-10-CM | POA: Diagnosis not present

## 2023-12-20 DIAGNOSIS — B952 Enterococcus as the cause of diseases classified elsewhere: Secondary | ICD-10-CM | POA: Diagnosis not present

## 2023-12-20 DIAGNOSIS — G9341 Metabolic encephalopathy: Secondary | ICD-10-CM | POA: Diagnosis not present

## 2023-12-20 DIAGNOSIS — R079 Chest pain, unspecified: Secondary | ICD-10-CM | POA: Diagnosis not present

## 2023-12-20 DIAGNOSIS — I959 Hypotension, unspecified: Secondary | ICD-10-CM | POA: Diagnosis not present

## 2023-12-20 DIAGNOSIS — G40909 Epilepsy, unspecified, not intractable, without status epilepticus: Secondary | ICD-10-CM | POA: Diagnosis not present

## 2023-12-20 DIAGNOSIS — G9340 Encephalopathy, unspecified: Secondary | ICD-10-CM | POA: Diagnosis not present

## 2023-12-20 DIAGNOSIS — R569 Unspecified convulsions: Secondary | ICD-10-CM | POA: Diagnosis not present

## 2023-12-20 DIAGNOSIS — J1001 Influenza due to other identified influenza virus with the same other identified influenza virus pneumonia: Secondary | ICD-10-CM | POA: Diagnosis not present

## 2023-12-20 DIAGNOSIS — Z049 Encounter for examination and observation for unspecified reason: Secondary | ICD-10-CM | POA: Diagnosis not present

## 2023-12-20 DIAGNOSIS — I69354 Hemiplegia and hemiparesis following cerebral infarction affecting left non-dominant side: Secondary | ICD-10-CM | POA: Diagnosis not present

## 2023-12-20 DIAGNOSIS — N179 Acute kidney failure, unspecified: Secondary | ICD-10-CM | POA: Diagnosis not present

## 2023-12-20 DIAGNOSIS — I6601 Occlusion and stenosis of right middle cerebral artery: Secondary | ICD-10-CM | POA: Diagnosis not present

## 2023-12-20 DIAGNOSIS — R579 Shock, unspecified: Secondary | ICD-10-CM | POA: Diagnosis not present

## 2023-12-20 DIAGNOSIS — E038 Other specified hypothyroidism: Secondary | ICD-10-CM | POA: Diagnosis not present

## 2023-12-20 DIAGNOSIS — J154 Pneumonia due to other streptococci: Secondary | ICD-10-CM | POA: Diagnosis not present

## 2023-12-20 DIAGNOSIS — R52 Pain, unspecified: Secondary | ICD-10-CM | POA: Diagnosis not present

## 2023-12-20 DIAGNOSIS — R6521 Severe sepsis with septic shock: Secondary | ICD-10-CM | POA: Diagnosis not present

## 2023-12-20 DIAGNOSIS — C716 Malignant neoplasm of cerebellum: Secondary | ICD-10-CM | POA: Diagnosis not present

## 2023-12-20 DIAGNOSIS — R570 Cardiogenic shock: Secondary | ICD-10-CM | POA: Diagnosis not present

## 2023-12-20 DIAGNOSIS — D649 Anemia, unspecified: Secondary | ICD-10-CM | POA: Diagnosis not present

## 2023-12-20 DIAGNOSIS — I2693 Single subsegmental pulmonary embolism without acute cor pulmonale: Secondary | ICD-10-CM | POA: Diagnosis not present

## 2023-12-20 DIAGNOSIS — I6340 Cerebral infarction due to embolism of unspecified cerebral artery: Secondary | ICD-10-CM | POA: Diagnosis not present

## 2024-01-08 ENCOUNTER — Ambulatory Visit: Payer: Medicare Other | Admitting: Family Medicine

## 2024-01-12 ENCOUNTER — Other Ambulatory Visit: Payer: Self-pay | Admitting: Family Medicine

## 2024-01-12 DIAGNOSIS — M1612 Unilateral primary osteoarthritis, left hip: Secondary | ICD-10-CM

## 2024-01-12 DIAGNOSIS — R10826 Epigastric rebound abdominal tenderness: Secondary | ICD-10-CM

## 2024-01-12 DIAGNOSIS — K219 Gastro-esophageal reflux disease without esophagitis: Secondary | ICD-10-CM

## 2024-01-12 DEATH — deceased
